# Patient Record
Sex: Female | Born: 1965 | Race: White | Hispanic: No | Smoking: Current every day smoker
Health system: Southern US, Community
[De-identification: ages and names within clinical notes are randomized; demographics above are authoritative.]

## PROBLEM LIST (undated history)

## (undated) DIAGNOSIS — J189 Pneumonia, unspecified organism: Secondary | ICD-10-CM

## (undated) DIAGNOSIS — F319 Bipolar disorder, unspecified: Secondary | ICD-10-CM

## (undated) DIAGNOSIS — G5603 Carpal tunnel syndrome, bilateral upper limbs: Secondary | ICD-10-CM

## (undated) DIAGNOSIS — Z8719 Personal history of other diseases of the digestive system: Secondary | ICD-10-CM

## (undated) DIAGNOSIS — Z9889 Other specified postprocedural states: Secondary | ICD-10-CM

## (undated) DIAGNOSIS — B009 Herpesviral infection, unspecified: Secondary | ICD-10-CM

## (undated) DIAGNOSIS — N200 Calculus of kidney: Secondary | ICD-10-CM

## (undated) DIAGNOSIS — M1990 Unspecified osteoarthritis, unspecified site: Secondary | ICD-10-CM

## (undated) DIAGNOSIS — Z9289 Personal history of other medical treatment: Secondary | ICD-10-CM

## (undated) DIAGNOSIS — C539 Malignant neoplasm of cervix uteri, unspecified: Secondary | ICD-10-CM

## (undated) DIAGNOSIS — Z87442 Personal history of urinary calculi: Secondary | ICD-10-CM

## (undated) DIAGNOSIS — J449 Chronic obstructive pulmonary disease, unspecified: Secondary | ICD-10-CM

## (undated) DIAGNOSIS — F32A Depression, unspecified: Secondary | ICD-10-CM

## (undated) DIAGNOSIS — K219 Gastro-esophageal reflux disease without esophagitis: Secondary | ICD-10-CM

## (undated) DIAGNOSIS — R0600 Dyspnea, unspecified: Secondary | ICD-10-CM

## (undated) HISTORY — DX: Malignant neoplasm of cervix uteri, unspecified: C53.9

## (undated) HISTORY — PX: TUBAL LIGATION: SHX77

## (undated) HISTORY — PX: MULTIPLE TOOTH EXTRACTIONS: SHX2053

## (undated) HISTORY — PX: CERVICAL CONE BIOPSY: SUR198

## (undated) HISTORY — DX: Carpal tunnel syndrome, bilateral upper limbs: G56.03

## (undated) HISTORY — DX: Personal history of other medical treatment: Z92.89

## (undated) HISTORY — PX: TONSILLECTOMY: SUR1361

## (undated) HISTORY — PX: WISDOM TOOTH EXTRACTION: SHX21

---

## 2011-12-14 ENCOUNTER — Encounter (HOSPITAL_COMMUNITY): Payer: Self-pay

## 2011-12-14 ENCOUNTER — Emergency Department (INDEPENDENT_AMBULATORY_CARE_PROVIDER_SITE_OTHER)
Admission: EM | Admit: 2011-12-14 | Discharge: 2011-12-14 | Disposition: A | Discharge status: Home / Self Care | Payer: Self-pay | Source: Home / Self Care | Attending: Family Medicine | Admitting: Family Medicine

## 2011-12-14 DIAGNOSIS — L259 Unspecified contact dermatitis, unspecified cause: Secondary | ICD-10-CM

## 2011-12-14 DIAGNOSIS — N760 Acute vaginitis: Secondary | ICD-10-CM

## 2011-12-14 LAB — POCT URINALYSIS DIP (DEVICE)
Bilirubin Urine: NEGATIVE
Glucose, UA: NEGATIVE mg/dL
Hgb urine dipstick: NEGATIVE
Leukocytes, UA: NEGATIVE
Nitrite: NEGATIVE
Urobilinogen, UA: 0.2 mg/dL (ref 0.0–1.0)
pH: 7 (ref 5.0–8.0)

## 2011-12-14 LAB — WET PREP, GENITAL

## 2011-12-14 MED ORDER — METRONIDAZOLE 500 MG PO TABS: 500.0000 mg | ORAL_TABLET | Freq: Two times a day (BID) | ORAL | Status: AC

## 2011-12-14 MED ORDER — PREDNISONE (PAK) 10 MG PO TABS: ORAL_TABLET | ORAL | Status: DC

## 2011-12-14 NOTE — Discharge Instructions (Signed)
No intercourse x 10 days. We will contact you with any positive lab results and any new instructions if indicated.

## 2011-12-14 NOTE — ED Notes (Signed)
Call back number verified at releade

## 2011-12-14 NOTE — ED Notes (Signed)
States she just got out of jail, and they declined to give her the remining pills in her prednisone dose pack; she was to [facility] another MD for this. States she had trichamonis a couple of yrs ago, and thinks she has it again, since shr has the same symptoms again as she did a few yrs ago; NAD

## 2011-12-14 NOTE — ED Provider Notes (Signed)
History     CSN: [id number]  Arrival date & time 12/14/11  1412   First MD Initiated Contact with Patient 12/14/11 1536      Chief Complaint  Patient presents with  . Medication Refill    (Consider location/radiation/quality/duration/timing/severity/associated sxs/prior treatment) HPI Comments: The patient reports just getting out of jail a wk ago. States she was in the middle of a prednisone taper for poison ivy when released . States they would not give her the remaining tabs. She also states she is having a vaginal discharge. No odor. States she has had trich in past and it feels the same.   The history is provided by the patient.    History reviewed. No pertinent past medical history.  Past Surgical History  Procedure Date  . Tubal ligation     History reviewed. No pertinent family history.  History  Substance Use Topics  . Smoking status: Current Everyday Smoker  . Smokeless tobacco: Not on file  . Alcohol Use: No    OB History    Grav Para Term Preterm Abortions TAB SAB Ect Mult Living                  Review of Systems  Constitutional: Negative.   HENT: Negative.   Respiratory: Negative.   Cardiovascular: Negative.   Gastrointestinal: Negative.   Skin: Negative.     Allergies  Review of patient's allergies indicates no known allergies.  Home Medications   Current Outpatient Rx  Name Route Sig Dispense Refill  . METRONIDAZOLE 500 MG PO TABS Oral Take 1 tablet (500 mg total) by mouth 2 (two) times daily. 14 tablet 0  . PREDNISONE (PAK) 10 MG PO TABS  Disp one 6 day taper Take as directed with food 21 tablet 0    BP 120/70  Pulse 59  Temp(Src) 98.7 F (37.1 C) (Oral)  Resp 24  SpO2 100%  Physical Exam  Nursing note and vitals reviewed. Constitutional: She appears well-developed and well-nourished.  HENT:  Head: Normocephalic and atraumatic.  Cardiovascular: Normal rate, regular rhythm and normal heart sounds.   Pulmonary/Chest: Effort  normal and breath sounds normal.  Abdominal: Soft. Bowel sounds are normal.  Genitourinary:       Pelvic exam with female nursing personal Blase Mess  Assisting reveals no skin or vulvar lesion. Thin white discharge noted. No cmt. Samples collected.   Skin: Skin is warm and dry.    ED Course  Procedures (including critical care time)   Labs Reviewed  POCT URINALYSIS DIP (DEVICE)  POCT PREGNANCY, URINE  GC/CHLAMYDIA PROBE AMP, GENITAL  WET PREP, GENITAL   No results found.   1. Vaginitis   2. Contact dermatitis       MDM          Randa Spike, MD 12/14/11 [phone]

## 2011-12-15 LAB — GC/CHLAMYDIA PROBE AMP, GENITAL: Chlamydia, DNA Probe: NEGATIVE

## 2011-12-28 ENCOUNTER — Telehealth (HOSPITAL_COMMUNITY): Payer: Self-pay | Admitting: *Deleted

## 2011-12-28 NOTE — ED Notes (Signed)
Pt. called on VM for her lab results. States her phone was cut off and is calling from a friends phone.  I called her back. Pt. verified x 2 and given results.  Pt. asked if she had trich?  I said no, only a few clue cells and a few WBC's, that the doctors don't treat.  She said she got Flagyl because of a possible trich. exposure. I told her that was the safest thing to do. Also, if it was bacterial vaginosis then she was adequately treated with the Flagyl. [name] 12/28/2011

## 2012-04-29 ENCOUNTER — Emergency Department (HOSPITAL_COMMUNITY)
Admission: EM | Admit: 2012-04-29 | Discharge: 2012-04-29 | Disposition: A | Discharge status: Home / Self Care | Payer: Self-pay | Attending: Emergency Medicine | Admitting: Emergency Medicine

## 2012-04-29 ENCOUNTER — Encounter (HOSPITAL_COMMUNITY): Payer: Self-pay | Admitting: Emergency Medicine

## 2012-04-29 DIAGNOSIS — K0889 Other specified disorders of teeth and supporting structures: Secondary | ICD-10-CM

## 2012-04-29 DIAGNOSIS — Z79899 Other long term (current) drug therapy: Secondary | ICD-10-CM | POA: Insufficient documentation

## 2012-04-29 DIAGNOSIS — F17200 Nicotine dependence, unspecified, uncomplicated: Secondary | ICD-10-CM | POA: Insufficient documentation

## 2012-04-29 DIAGNOSIS — K047 Periapical abscess without sinus: Secondary | ICD-10-CM | POA: Insufficient documentation

## 2012-04-29 DIAGNOSIS — Z87442 Personal history of urinary calculi: Secondary | ICD-10-CM | POA: Insufficient documentation

## 2012-04-29 HISTORY — DX: Calculus of kidney: N20.0

## 2012-04-29 MED ORDER — AMOXICILLIN 500 MG PO CAPS: 500.0000 mg | ORAL_CAPSULE | Freq: Three times a day (TID) | ORAL | Status: DC

## 2012-04-29 MED ORDER — HYDROCODONE-ACETAMINOPHEN 5-500 MG PO TABS: 1.0000 | ORAL_TABLET | Freq: Four times a day (QID) | ORAL | Status: DC | PRN

## 2012-04-29 NOTE — ED Provider Notes (Signed)
History     CSN: [id number]  Arrival date & time 04/29/12  1751   First MD Initiated Contact with Patient 04/29/12 2019      Chief Complaint  Patient presents with  . Dental Pain    (Consider location/radiation/quality/duration/timing/severity/associated sxs/prior treatment) Patient is a 46 y.o. female presenting with tooth pain. The history is provided by the patient.  Dental PainPrimary symptoms do not include headaches, fever, shortness of breath or sore throat.  pt c/o right lower dental pain and swelling for past few days. Constant, dull. Worse w eating and palpation area. No injury to area. No throat pain or swelling. No neck pain/swelling. No trouble breathing or swallowing. No fever or chills.     Past Medical History  Diagnosis Date  . Kidney stone     Past Surgical History  Procedure Date  . Tubal ligation     No family history on file.  History  Substance Use Topics  . Smoking status: Current Every Day Smoker  . Smokeless tobacco: Not on file  . Alcohol Use: No    OB History    Grav Para Term Preterm Abortions TAB SAB Ect Mult Living                  Review of Systems  Constitutional: Negative for fever and chills.  HENT: Negative for sore throat and neck pain.   Respiratory: Negative for shortness of breath.   Skin: Negative for rash.  Neurological: Negative for headaches.    Allergies  Review of patient's allergies indicates no known allergies.  Home Medications   Current Outpatient Rx  Name Route Sig Dispense Refill  . BUPROPION HCL ER (SR) 100 MG PO TB12 Oral Take 100 mg by mouth 2 (two) times daily.    Marland Kitchen GABAPENTIN 300 MG PO CAPS Oral Take 300 mg by mouth 2 (two) times daily.      BP 116/78  Pulse 64  Temp 98.6 F (37 C) (Oral)  Resp 20  SpO2 98%  LMP 04/29/2012  Physical Exam  Nursing note and vitals reviewed. Constitutional: She appears well-developed and well-nourished. No distress.  HENT:  Mouth/Throat: Oropharynx is  clear and moist.       Pharynx normal. Right lower molar dental tenderness and associated gum swelling. No trismus. No pain,swelling or tenderness to neck or floor of mouth.    Eyes: Conjunctivae normal are normal. No scleral icterus.  Neck: Neck supple. No tracheal deviation present.  Cardiovascular: Normal rate.   Pulmonary/Chest: Effort normal. No respiratory distress.  Abdominal: Normal appearance.  Musculoskeletal: She exhibits no edema.  Lymphadenopathy:    She has no cervical adenopathy.  Neurological: She is alert.  Skin: Skin is warm and dry. No rash noted.  Psychiatric: She has a normal mood and affect.    ED Course  Procedures (including critical care time)     MDM  Exam c/w dental abscess. Confirmed nkda w pt.  Discussed need close dental follow up.         Suzi Roots, MD 04/29/12 2026

## 2012-04-29 NOTE — ED Notes (Signed)
Pt st's she has pain in right upper back tooth for several days.  Woke up this am with swelling to right side of face and jaw.

## 2013-04-09 ENCOUNTER — Encounter (HOSPITAL_COMMUNITY): Payer: Self-pay | Admitting: Emergency Medicine

## 2013-04-09 ENCOUNTER — Emergency Department (HOSPITAL_COMMUNITY)
Admission: EM | Admit: 2013-04-09 | Discharge: 2013-04-09 | Disposition: A | Discharge status: Home / Self Care | Payer: Self-pay | Attending: Emergency Medicine | Admitting: Emergency Medicine

## 2013-04-09 DIAGNOSIS — Z792 Long term (current) use of antibiotics: Secondary | ICD-10-CM | POA: Insufficient documentation

## 2013-04-09 DIAGNOSIS — F17200 Nicotine dependence, unspecified, uncomplicated: Secondary | ICD-10-CM | POA: Insufficient documentation

## 2013-04-09 DIAGNOSIS — Z79899 Other long term (current) drug therapy: Secondary | ICD-10-CM | POA: Insufficient documentation

## 2013-04-09 DIAGNOSIS — K0889 Other specified disorders of teeth and supporting structures: Secondary | ICD-10-CM

## 2013-04-09 DIAGNOSIS — Z87442 Personal history of urinary calculi: Secondary | ICD-10-CM | POA: Insufficient documentation

## 2013-04-09 DIAGNOSIS — K089 Disorder of teeth and supporting structures, unspecified: Secondary | ICD-10-CM | POA: Insufficient documentation

## 2013-04-09 MED ORDER — HYDROCODONE-ACETAMINOPHEN 5-325 MG PO TABS: 1.0000 | ORAL_TABLET | Freq: Four times a day (QID) | ORAL | Status: DC | PRN

## 2013-04-09 MED ORDER — PENICILLIN V POTASSIUM 500 MG PO TABS: 500.0000 mg | ORAL_TABLET | Freq: Four times a day (QID) | ORAL | Status: AC

## 2013-04-09 MED ORDER — IBUPROFEN 800 MG PO TABS: 800.0000 mg | ORAL_TABLET | Freq: Three times a day (TID) | ORAL | Status: DC

## 2013-04-09 NOTE — ED Provider Notes (Signed)
CSN: [id number]     Arrival date & time 04/09/13  1322 History  This chart was scribed for non-physician practitioner Jaynie Crumble, PA-C working with Suzi Roots, MD by Valera Castle, ED scribe. This patient was seen in room TR04C/TR04C and the patient's care was started at 2:52 PM.    Chief Complaint  Patient presents with  . Dental Pain    Patient is a 47 y.o. female presenting with tooth pain. The history is provided by the patient. No language interpreter was used.  Dental Pain Location:  Lower Severity:  Moderate Onset quality:  Gradual Duration:  2 days Timing:  Constant Progression:  Worsening Chronicity:  New Abscess: Pt reports possible abscess.   Relieved by:  Nothing Associated symptoms: no fever    HPI Comments: Flora Parks is a 47 y.o. female who presents to the Emergency Department complaining of sudden, right lower dental pain, onset 2 days ago. She thinks there may be an abscess. She reports that a filling fell out of a lower tooth about 2 years ago. She states that she was unable to sleep last night due to the pain. She reports taking tylenol, with little relief. She denies fever, or any other associated symptoms. She has no known allergies, and other than a kidney stone, she denies any medical history.  Past Medical History  Diagnosis Date  . Kidney stone    Past Surgical History  Procedure Laterality Date  . Tubal ligation     No family history on file. History  Substance Use Topics  . Smoking status: Current Every Day Smoker  . Smokeless tobacco: Not on file  . Alcohol Use: No   OB History   Grav Para Term Preterm Abortions TAB SAB Ect Mult Living                 Review of Systems  Constitutional: Negative for fever.  HENT: Positive for dental problem (Right lower tooth pain.).   All other systems reviewed and are negative.    Allergies  Review of patient's allergies indicates no known allergies.  Home Medications   Current  Outpatient Rx  Name  Route  Sig  Dispense  Refill  . amoxicillin (AMOXIL) 500 MG capsule   Oral   Take 1 capsule (500 mg total) by mouth 3 (three) times daily.   21 capsule   0   . buPROPion (WELLBUTRIN SR) 100 MG 12 hr tablet   Oral   Take 100 mg by mouth 2 (two) times daily.         Marland Kitchen gabapentin (NEURONTIN) 300 MG capsule   Oral   Take 300 mg by mouth 2 (two) times daily.         Marland Kitchen HYDROcodone-acetaminophen (VICODIN) 5-500 MG per tablet   Oral   Take 1-2 tablets by mouth every 6 (six) hours as needed for pain.   20 tablet   0    Triage Vitals: BP 118/79  Pulse 68  Temp(Src) 98.1 F (36.7 C) (Oral)  Resp 22  Ht 5\' 7"  (1.702 m)  Wt 160 lb (72.576 kg)  BMI 25.05 kg/m2  SpO2 99%  Physical Exam  Nursing note and vitals reviewed. Constitutional: She is oriented to person, place, and time. She appears well-developed and well-nourished. No distress.  HENT:  Head: Normocephalic and atraumatic.  Poor dentition. Large cavity in right lower second molar with surrounding gum erythema. No obvious abscess. No gum swelling. No facial swelling. Tooth tender to palpation.  Eyes: EOM are normal.  Neck: Neck supple. No tracheal deviation present.  Cardiovascular: Normal rate.   Pulmonary/Chest: Effort normal. No respiratory distress.  Musculoskeletal: Normal range of motion.  Neurological: She is alert and oriented to person, place, and time.  Skin: Skin is warm and dry.  Psychiatric: She has a normal mood and affect. Her behavior is normal.    ED Course  Procedures (including critical care time)  DIAGNOSTIC STUDIES: Oxygen Saturation is 99% on room air, normal by my interpretation.    COORDINATION OF CARE: 2:56 PM-Discussed treatment plan with pt at bedside and pt agreed to plan.      Labs Review Labs Reviewed - No data to display Imaging Review No results found.  MDM   1. Pain, dental    Patient with right lower tooth pain. Her pain is a large cavity in it.  No obvious abscess at this time. Patient states last year and this time patient had a large abscess but did never followup with a dentist. I have discussed the importance of following up. Patient will be started on penicillin, ibuprofen Norco for pain. Patient given resources for followup. Patient is nontoxic afebrile. Stable for discharge.  Filed Vitals:   04/09/13 1327  BP: 118/79  Pulse: 68  Temp: 98.1 F (36.7 C)  TempSrc: Oral  Resp: 22  Height: 5\' 7"  (1.702 m)  Weight: 160 lb (72.576 kg)  SpO2: 99%    I personally performed the services described in this documentation, which was scribed in my presence. The recorded information has been reviewed and is accurate.   Lottie Mussel, PA-C 04/09/13 1520

## 2013-04-09 NOTE — ED Notes (Signed)
Lower rt  Tooth pain x 2 days

## 2013-04-14 NOTE — ED Provider Notes (Signed)
Medical screening examination/treatment/procedure(s) were performed by non-physician practitioner and as supervising physician I was immediately available for consultation/collaboration.   Mykai Wendorf E Latonyia Lopata, MD 04/14/13 0736 

## 2013-08-18 ENCOUNTER — Encounter (HOSPITAL_COMMUNITY): Payer: Self-pay | Admitting: Emergency Medicine

## 2013-08-18 ENCOUNTER — Emergency Department (HOSPITAL_COMMUNITY)
Admission: EM | Admit: 2013-08-18 | Discharge: 2013-08-18 | Disposition: A | Discharge status: Home / Self Care | Payer: Self-pay | Attending: Emergency Medicine | Admitting: Emergency Medicine

## 2013-08-18 DIAGNOSIS — K0889 Other specified disorders of teeth and supporting structures: Secondary | ICD-10-CM

## 2013-08-18 DIAGNOSIS — Z87442 Personal history of urinary calculi: Secondary | ICD-10-CM | POA: Insufficient documentation

## 2013-08-18 DIAGNOSIS — K089 Disorder of teeth and supporting structures, unspecified: Secondary | ICD-10-CM | POA: Insufficient documentation

## 2013-08-18 DIAGNOSIS — F17200 Nicotine dependence, unspecified, uncomplicated: Secondary | ICD-10-CM | POA: Insufficient documentation

## 2013-08-18 MED ORDER — OXYCODONE-ACETAMINOPHEN 5-325 MG PO TABS
2.0000 | ORAL_TABLET | Freq: Once | ORAL | Status: AC
  Administered 2013-08-18: 2 via ORAL
  Filled 2013-08-18: qty 2

## 2013-08-18 MED ORDER — PENICILLIN V POTASSIUM 250 MG PO TABS
500.0000 mg | ORAL_TABLET | Freq: Once | ORAL | Status: AC
  Administered 2013-08-18: 500 mg via ORAL
  Filled 2013-08-18: qty 2

## 2013-08-18 MED ORDER — ONDANSETRON 4 MG PO TBDP
8.0000 mg | ORAL_TABLET | Freq: Once | ORAL | Status: AC
  Administered 2013-08-18: 8 mg via ORAL
  Filled 2013-08-18: qty 2

## 2013-08-18 MED ORDER — OXYCODONE-ACETAMINOPHEN 5-325 MG PO TABS: 2.0000 | ORAL_TABLET | ORAL | Status: DC | PRN

## 2013-08-18 MED ORDER — PENICILLIN V POTASSIUM 500 MG PO TABS: 500.0000 mg | ORAL_TABLET | Freq: Four times a day (QID) | ORAL | Status: DC

## 2013-08-18 NOTE — ED Provider Notes (Signed)
CSN: [id number]     Arrival date & time 08/18/13  1528 History  This chart was scribed for Alvina Chou, PA working with Blanchie Dessert, MD by Roxan Diesel, ED Scribe. This patient was seen in room TR08C/TR08C and [facility] patient's care was started at 6:09 PM.   Chief Complaint  Patient presents with  . Facial Swelling    [facility] history is provided by Katie patient. No language interpreter was used.    HPI Comments: [facility] is a 48 y.o. female with h/o dental abscesses who presents to [facility] complaining of progressively-worsening upper right-sided dental pain with associated facial swelling that she first noticed after waking this morning.  Pt states she initially noticed a small amount of swelling with only minimal pain.  Pain and swelling both became significantly more severe over [facility] course of several hours.  She describes her pain as throbbing, at a severity of 9/10.  It is worsened by moving her mouth.  She has not attempted to treat pain pta.  She denies fever or SOB.  Pt has h/o dental abscess on lower right jaw.   Past Medical History  Diagnosis Date  . Kidney stone     Past Surgical History  Procedure Laterality Date  . Tubal ligation      No family history on file.   History  Substance Use Topics  . Smoking status: Current Every Day Smoker    Types: Cigarettes  . Smokeless tobacco: Not on file  . Alcohol Use: No    OB History   Grav Para Term Preterm Abortions TAB SAB Ect Mult Living                  Review of Systems  Constitutional: Negative for fever.  HENT: Positive for dental problem and facial swelling.   Respiratory: Negative for shortness of breath.   All other systems reviewed and are negative.     Allergies  Review of patient's allergies indicates no known allergies.  Home Medications   No current outpatient prescriptions on file. BP 133/81  Pulse 60  Temp(Src) 97.9 F (36.6 C) (Oral)  Resp 20  Ht 5\' 7"  (1.702  m)  Wt 165 lb (74.844 kg)  BMI 25.84 kg/m2  SpO2 100%  Physical Exam  Nursing note and vitals reviewed. Constitutional: She is oriented to person, place, and time. She appears well-developed and well-nourished. No distress.  HENT:  Head: Normocephalic and atraumatic.  Mild induration and edema of right maxillary skin that is tender to palpation Poor dentition.  Right upper incisor tender to percussion.  No abscess identified.  Eyes: EOM are normal.  Neck: Neck supple. No tracheal deviation present.  Cardiovascular: Normal rate.   Pulmonary/Chest: Effort normal. No respiratory distress.  Musculoskeletal: Normal range of motion.  Lymphadenopathy:    She has no cervical adenopathy.  Neurological: She is alert and oriented to person, place, and time.  Skin: Skin is warm and dry.  Psychiatric: She has a normal mood and affect. Her behavior is normal.    ED Course  Procedures (including critical care time)  DIAGNOSTIC STUDIES: Oxygen Saturation is 100% on room air, normal by my interpretation.    COORDINATION OF CARE: 6:12 PM-Discussed treatment plan which includes pain medication, antibiotics, and dental referral with pt at bedside and pt agreed to plan.    Labs Review Labs Reviewed - No data to display  Imaging Review No results found.  EKG Interpretation   None  MDM   Final diagnoses:  None  1. Dental pain  6:17 PM Patient will have Percocet and penicillin for symptoms. Patient advised to follow up with dentist from [facility] resource guide for further evaluation and management. Patient instructed to return with worsening or concerning symptoms. Vitals stable and patient febrile.     I personally performed [facility] services described in this documentation, which was scribed in my presence. [facility] recorded information has been reviewed and is accurate.    Alvina Chou, PA-C 08/18/13 1819

## 2013-08-18 NOTE — ED Notes (Signed)
Pt states she is not sure if she has an infected tooth or not but woke up this morning with a little pain and swelling to her cheek just beside the bridge of her nose on the right side.

## 2013-08-18 NOTE — Discharge Instructions (Signed)
Take Veetid as directed until gone. Take Percocet as needed for pain. Follow up with dentist from the resource guide. Refer to attached documents for more information.    Emergency Department Resource Guide 1) Find a Doctor and Pay Out of Pocket Although you won't have to find out who is covered by your insurance plan, it is a good idea to ask around and get recommendations. You will then need to call the office and see if the doctor you have chosen will accept you as a new patient and what types of options they offer for patients who are self-pay. Some doctors offer discounts or will set up payment plans for their patients who do not have insurance, but you will need to ask so you aren't surprised when you get to your appointment.  2) Contact Your Local Health Department Not all health departments have doctors that can see patients for sick visits, but many do, so it is worth a call to see if yours does. If you don't know where your local health department is, you can check in your phone book. The CDC also has a tool to help you locate your state's health department, and many state websites also have listings of all of their local health departments.  3) Find a [facility] If your illness is not likely to be very severe or complicated, you may want to try a walk in clinic. These are popping up all over the country in pharmacies, drugstores, and shopping centers. They're usually staffed by nurse practitioners or physician assistants that have been trained to treat common illnesses and complaints. They're usually fairly quick and inexpensive. However, if you have serious medical issues or chronic medical problems, these are probably not your best option.  No Primary Care Doctor: - Call Health Connect at  [phone] - they can help you locate a primary care doctor that  accepts your insurance, provides certain services, etc. - Physician Referral Service- [phone]  Chronic Pain  Problems: Organization         Address  Phone   Notes  [facility]  [phone] Patients need to be referred by their primary care doctor.   Medication Assistance: Organization         Address  Phone   Notes  [facility] [address] [place], [place] [address] [phone] --Must be a resident of [facility] -- Must have NO insurance coverage whatsoever (no Medicaid/ Medicare, etc.) -- The pt. MUST have a primary care doctor that directs their care regularly and follows them in the community   MedAssist  [phone]   [employer]  [phone]    Agencies that provide inexpensive medical care: Organization         Address  Phone   Notes  [place]  [phone]   [facility]    [phone]   [facility] [place], [place] [address] [phone]   [place] [address] [place] ([phone]   Planned Parenthood    [phone]   [facility]    [phone]   [place] and [place] [address], [place] Phone:  [phone], Fax:  [phone] Hours of Operation:  9 am - 6 pm, M-F.  Also accepts Medicaid/Medicare and self-pay.  [facility] for [place] [address],  [address], [place] Phone: [phone], Fax: [phone]. Hours of Operation:  8:30 am - 5:30 pm, M-F.  Also accepts Medicaid and self-pay.  [facility] [place] [address] [place] Phone: [phone]   [place], [place], [place] [phone], Ext. [phone] Mondays & Thursdays: 7-9 AM.  First 15 patients are seen on a first come, first serve basis.    [place] Providers:  Organization         Address  Phone   Notes  [facility] [address], [place] [phone] Also  accepts self-pay patients.  [facility] [address], [place]  [phone]   [place] [address], [place] [phone]   [facility] [address] [place] [phone]   Lucianne Lei [address], [place]   [phone] Only accepts [place] Access [place] patients after they have their name applied to their card.   Self-Pay (no insurance) in [facility]:  Organization         Address  Phone   Notes  Sickle Cell Patients, [facility] Internal Medicine [place] [phone]   [facility] [place] [phone]   [facility] [place]  [place], [place], [place] [phone]   [facility]/Dr. Osei-Bonsu  [address] [place] [address], [place] or [address], [place] [phone] Phone number for both [place] and [place] locations is the same.  [facility] [address] [place] [phone]   [facility] [address], [place] or [address] [phone]   [facility] [address], [place] [phone], phone; [phone], fax Sees patients 1st and 3rd Saturday of every month.  Must not qualify for public or private insurance (i.e. Medicaid, Medicare, [place] Health Choice, Veterans' Benefits)  Household income should be no more than 200% of the poverty level The clinic cannot treat you if you are pregnant or think you are pregnant  Sexually transmitted diseases are not treated at the clinic.    Dental Care: Organization         Address  Phone  Notes  [facility] Department of [facility] [phone] Accepts children up to age 14 who are enrolled in [place] or [place]; pregnant  women with a Medicaid card; and children who have applied for Medicaid or [place] Health Choice, but were declined, whose parents can pay a reduced fee at time of service.  [facility] Department of [facility]  [address], [place] [phone] Accepts children up to age 57 who are enrolled in [place] or [place]; pregnant women with a Medicaid card; and children who have applied for Medicaid or [place] Health Choice, but were declined, whose parents can pay a reduced fee at time of service.  [facility] Access PROGRAM  [place] [phone] Patients are seen by appointment only. Walk-ins are not accepted. [place] will see patients 65 years of age and older. Monday - Tuesday (8am-5pm) Most Wednesdays (8:30-5pm) $30 per visit, cash only  [place]  [address], [place] [phone] Patients are seen by appointment only. Walk-ins are not accepted. [place] will see patients 47 years of age and older. One Wednesday Evening (Monthly: Volunteer Based).  $30 per visit, cash only  [place]  [phone] for adults; Children under age 20, call Graduate Pediatric Dentistry at [phone]. Children aged 75-14, please call [phone] to request a pediatric application.  Dental services are provided in all areas of dental care including fillings, crowns and bridges, complete and partial dentures, implants, gum treatment, root canals, and extractions. Preventive care is also provided. Treatment is provided to both adults and children. Patients are selected via a lottery and there is often a waiting list.   [facility] [address], [place]  [phone] [email or web address]   [facility] [address] [place], [place] [phone], Ext. [phone] Second and Fourth Thursday of each month, opens at 6:30 AM; Clinic ends at 9 AM.  Patients are  seen on a first-come first-served basis, and a limited number are seen during each clinic.   [facility]  [address] Hillard Danker [place], [place] [phone]   Eligibility Requirements You must have lived in [place], [place], or [place] counties for at least the last three months.   You cannot be eligible for state or federal sponsored [employer], including [employer], [place], or [employer].   You generally cannot be eligible for healthcare insurance through your employer.    How to apply: Eligibility screenings are held every Tuesday and Wednesday afternoon from 1:00 pm until 4:00 pm. You do not need an appointment for the interview!  [facility] [address] [place], [place]   [place]  [place] Department  [place]  [phone]    Behavioral Health Resources in the Community: Intensive Outpatient Programs Organization         Address  Phone  Notes  [place]. [address] [place], [place] [phone]   [facility] [address] [place], [place]   ADS: [facility] [address] [place], [place]   [place] [address]  [place], [place] or [phone]   Substance Abuse Resources Organization         Address  Phone  Notes  Alcohol and Drug Services  [phone]   [place]  [phone]   [facility] [place]   Chinita Pester  [phone]   Residential & Outpatient Substance Abuse Program  [phone]   Psychological Services Organization         Address  Phone  Notes  [facility] [place]  [place]  [phone]   [place] [address] [place] or [phone]    Mobile Crisis  Teams Organization         Address  Phone  Notes  [facility], Mobile Crisis Care Unit  [phone]   [facility]  [address] [place], [place]   [place] [address] [place] [phone]    Self-Help/Support Groups Organization         Address  Phone             Notes  [place]. of [place] -  variety of support groups  [phone]- [phone] Call for more information  Narcotics Anonymous (NA), [facility] [address], [employer] [place]  2 meetings at this location   Residential Facilities manager         Address  Phone  Notes  [facility] [place],    [place]  [phone]   [facility]  [address] [place] 623762, [place], [place]   [place], [place] [phone] Admissions: 8am-3pm M-F  Incentives Substance [place] [address],    [place], [place] [phone]   [facility] [address] [place], [place], [place]   [facility] [address]  [place], [place]   [facility]ight Programs - Intensive Outpatient [address] Kristeen [address], [place], [place]   [facility] [place] ([place].,  [place], [place] [phone] or [phone]   [facility]idential Treatment Services (RTS) [address] [place], [place] Accepts Medicaid  [facility] [place] [address],  [place] [phone] Substance Abuse/Addiction Treatment   [facility] Organization         Address  Phone  Notes  [facility]  [phone]   Domenic Schwab, PhD [address] [place]lis [place], [place]   [phone] or [phone]   [place], [place] [phone]   [facility] [address] [place], [place] [phone]  Insurance/Medicaid/sponsorship through [facility] [employer] and Families [address]                                    [place], [place] [phone] [place] [address], [place] [phone]    Dr. Adele Schilder  [phone]   [facility] Dept. 1) [address] [place] 2) [place] 3)  [place] [address], [place] [phone]  [phone]   [place] [phone] or [phone] (After Hours)

## 2013-08-19 ENCOUNTER — Emergency Department (HOSPITAL_COMMUNITY): Payer: Self-pay

## 2013-08-19 ENCOUNTER — Encounter (HOSPITAL_COMMUNITY): Payer: Self-pay | Admitting: Emergency Medicine

## 2013-08-19 ENCOUNTER — Emergency Department (HOSPITAL_COMMUNITY)
Admission: EM | Admit: 2013-08-19 | Discharge: 2013-08-19 | Disposition: A | Discharge status: Home / Self Care | Payer: Self-pay | Attending: Emergency Medicine | Admitting: Emergency Medicine

## 2013-08-19 DIAGNOSIS — F17200 Nicotine dependence, unspecified, uncomplicated: Secondary | ICD-10-CM | POA: Insufficient documentation

## 2013-08-19 DIAGNOSIS — R221 Localized swelling, mass and lump, neck: Secondary | ICD-10-CM

## 2013-08-19 DIAGNOSIS — K047 Periapical abscess without sinus: Secondary | ICD-10-CM | POA: Insufficient documentation

## 2013-08-19 DIAGNOSIS — Z87442 Personal history of urinary calculi: Secondary | ICD-10-CM | POA: Insufficient documentation

## 2013-08-19 DIAGNOSIS — Z79899 Other long term (current) drug therapy: Secondary | ICD-10-CM | POA: Insufficient documentation

## 2013-08-19 DIAGNOSIS — R220 Localized swelling, mass and lump, head: Secondary | ICD-10-CM | POA: Insufficient documentation

## 2013-08-19 DIAGNOSIS — Z792 Long term (current) use of antibiotics: Secondary | ICD-10-CM | POA: Insufficient documentation

## 2013-08-19 LAB — CBC WITH DIFFERENTIAL/PLATELET
BASOS ABS: 0.1 10*3/uL (ref 0.0–0.1)
Basophils Relative: 1 % (ref 0–1)
EOS ABS: 0.3 10*3/uL (ref 0.0–0.7)
EOS PCT: 3 % (ref 0–5)
HCT: 37.4 % (ref 36.0–46.0)
Hemoglobin: 13.1 g/dL (ref 12.0–15.0)
Lymphocytes Relative: 39 % (ref 12–46)
Lymphs Abs: 4 10*3/uL (ref 0.7–4.0)
MCH: 32.8 pg (ref 26.0–34.0)
MCHC: 35 g/dL (ref 30.0–36.0)
MCV: 93.7 fL (ref 78.0–100.0)
Monocytes Absolute: 1.1 10*3/uL — ABNORMAL HIGH (ref 0.1–1.0)
Monocytes Relative: 10 % (ref 3–12)
Neutro Abs: 4.9 10*3/uL (ref 1.7–7.7)
Neutrophils Relative %: 48 % (ref 43–77)
PLATELETS: 275 10*3/uL (ref 150–400)
RBC: 3.99 MIL/uL (ref 3.87–5.11)
RDW: 12.8 % (ref 11.5–15.5)
WBC: 10.3 10*3/uL (ref 4.0–10.5)

## 2013-08-19 LAB — BASIC METABOLIC PANEL
BUN: 10 mg/dL (ref 6–23)
CALCIUM: 8.8 mg/dL (ref 8.4–10.5)
CO2: 27 mEq/L (ref 19–32)
Chloride: 107 mEq/L (ref 96–112)
Creatinine, Ser: 0.99 mg/dL (ref 0.50–1.10)
GFR calc Af Amer: 78 mL/min — ABNORMAL LOW (ref >90)
GFR, EST NON AFRICAN AMERICAN: 67 mL/min — AB (ref >90)
Glucose, Bld: 119 mg/dL — ABNORMAL HIGH (ref 70–99)
Potassium: 4.1 mEq/L (ref 3.7–5.3)
SODIUM: 145 meq/L (ref 137–147)

## 2013-08-19 MED ORDER — ONDANSETRON HCL 4 MG/2ML IJ SOLN
4.0000 mg | Freq: Once | INTRAMUSCULAR | Status: AC
  Administered 2013-08-19: 4 mg via INTRAVENOUS
  Filled 2013-08-19: qty 2

## 2013-08-19 MED ORDER — HYDROMORPHONE HCL PF 1 MG/ML IJ SOLN
1.0000 mg | Freq: Once | INTRAMUSCULAR | Status: AC
  Administered 2013-08-19: 1 mg via INTRAVENOUS
  Filled 2013-08-19: qty 1

## 2013-08-19 MED ORDER — IOHEXOL 300 MG/ML  SOLN
100.0000 mL | Freq: Once | INTRAMUSCULAR | Status: AC | PRN
  Administered 2013-08-19: 100 mL via INTRAVENOUS

## 2013-08-19 MED ORDER — CLINDAMYCIN PHOSPHATE 900 MG/50ML IV SOLN
900.0000 mg | Freq: Once | INTRAVENOUS | Status: AC
  Administered 2013-08-19: 900 mg via INTRAVENOUS
  Filled 2013-08-19: qty 50

## 2013-08-19 MED ORDER — SODIUM CHLORIDE 0.9 % IV BOLUS (SEPSIS)
1000.0000 mL | Freq: Once | INTRAVENOUS | Status: AC
  Administered 2013-08-19: 1000 mL via INTRAVENOUS

## 2013-08-19 NOTE — ED Provider Notes (Signed)
Medical screening examination/treatment/procedure(s) were performed by non-physician practitioner and as supervising physician I was immediately available for consultation/collaboration.  EKG Interpretation   None         Blanchie Dessert, MD 08/19/13 [phone]

## 2013-08-19 NOTE — ED Provider Notes (Signed)
Patient care assumed from [facility], PA-C at shift change with CT maxillofacial pending. Plan discussed with [facility], PA-C; will d/c if CT negative with instruction to continue Penicillin course.  CT significant for right facial swelling without gas for evidence of abscess. Swelling thought to be dental in origin given multiple right periapical abscesses. Patient afebrile and hemodynamically stable. She is appropriate for discharge today with instruction to followup with a dentist. Referrals and resource guide provided. Patient agreeable to plan with no unaddressed concerns.   Results for orders placed during the hospital encounter of 08/19/13  CBC WITH DIFFERENTIAL      Result Value Range   WBC 10.3  4.0 - 10.5 K/uL   RBC 3.99  3.87 - 5.11 MIL/uL   Hemoglobin 13.1  12.0 - 15.0 g/dL   HCT 37.4  36.0 - 46.0 %   MCV 93.7  78.0 - 100.0 fL   MCH 32.8  26.0 - 34.0 pg   MCHC 35.0  30.0 - 36.0 g/dL   RDW 12.8  11.5 - 15.5 %   Platelets 275  150 - 400 K/uL   Neutrophils Relative % 48  43 - 77 %   Neutro Abs 4.9  1.7 - 7.7 K/uL   Lymphocytes Relative 39  12 - 46 %   Lymphs Abs 4.0  0.7 - 4.0 K/uL   Monocytes Relative 10  3 - 12 %   Monocytes Absolute 1.1 (*) 0.1 - 1.0 K/uL   Eosinophils Relative 3  0 - 5 %   Eosinophils Absolute 0.3  0.0 - 0.7 K/uL   Basophils Relative 1  0 - 1 %   Basophils Absolute 0.1  0.0 - 0.1 K/uL  BASIC METABOLIC PANEL      Result Value Range   Sodium 145  137 - 147 mEq/L   Potassium 4.1  3.7 - 5.3 mEq/L   Chloride 107  96 - 112 mEq/L   CO2 27  19 - 32 mEq/L   Glucose, Bld 119 (*) 70 - 99 mg/dL   BUN 10  6 - 23 mg/dL   Creatinine, Ser 0.99  0.50 - 1.10 mg/dL   Calcium 8.8  8.4 - 10.5 mg/dL   GFR calc non Af Amer 67 (*) >90 mL/min   GFR calc Af Amer 78 (*) >90 mL/min   Ct Maxillofacial W/cm  08/19/2013   CLINICAL DATA:  Right-sided pain and swelling.  EXAM: CT MAXILLOFACIAL WITH CONTRAST  TECHNIQUE: Multidetector CT imaging of the maxillofacial  structures was performed with intravenous contrast. Multiplanar CT image reconstructions were also generated. A small metallic BB was placed on the right temple in order to reliably differentiate right from left.  CONTRAST:  121mL OMNIPAQUE IOHEXOL 300 MG/ML  SOLN  COMPARISON:  None available for comparison at time of study interpretation.  FINDINGS: Markedly right facial soft tissue swelling without subcutaneous gas or radiopaque foreign bodies. Right posterior mandible caries and periapical abscess and, in addition to tooth approximate 6 periapical abscess. No drainable fluid collections within the face. Right facial hyperemia with enlarged veins. No postseptal inflammatory changes, the ocular globes and orbital contents are nonsuspicious. Symmetric mildly prominent superior ophthalmic may veins may reflect Valsalva.  Mild right maxillary mucosal thickening without paranasal sinus air-fluid levels. Trace right frontal mucosal thickening extent of the right anterior ethmoid. Left concha bullosa.  Included view of the neck is unremarkable.  IMPRESSION: Right facial swelling without subcutaneous gas or abscess, this may be odontogenic in origin as there  are multiple right periapical abscess ease.   Electronically Signed   By: Elon Alas   On: 08/19/2013 04:17      Antonietta Breach, PA-C 08/19/13 [phone]

## 2013-08-19 NOTE — ED Notes (Signed)
Patient was seen here today and d/c home for dental abscess.  Patient went home and called EMS to bring her here b/c she feels like her dental problems is not better.  Patient was triaged and told to wait in waiting room.  Patient came to [facility] very upset, irate, and cursing.  Explained to patient the process of waiting and we will try to get her back as soon as we can.  Patient states, " I am back because I should have never been fucking d/c home."  Told patient to calm down and watch her language because there are kids in the ED.  Patient states, " I am not lowering by voice and I want to speak to the supervisor."  Radio broadcast assistant

## 2013-08-19 NOTE — ED Provider Notes (Signed)
Medical screening examination/treatment/procedure(s) were performed by non-physician practitioner and as supervising physician I was immediately available for consultation/collaboration.   Teressa Lower, MD 08/19/13 2259

## 2013-08-19 NOTE — Discharge Instructions (Signed)
Dental Abscess A dental abscess is a collection of infected fluid (pus) from a bacterial infection in the inner part of the tooth (pulp). It usually occurs at the end of the tooth's root.  CAUSES   Severe tooth decay.  Trauma to the tooth that allows bacteria to enter into the pulp, such as a broken or chipped tooth. SYMPTOMS   Severe pain in and around the infected tooth.  Swelling and redness around the abscessed tooth or in the mouth or face.  Tenderness.  Pus drainage.  Bad breath.  Bitter taste in the mouth.  Difficulty swallowing.  Difficulty opening the mouth.  Nausea.  Vomiting.  Chills.  Swollen neck glands. DIAGNOSIS   A medical and dental history will be taken.  An examination will be performed by tapping on the abscessed tooth.  X-rays may be taken of the tooth to identify the abscess. TREATMENT The goal of treatment is to eliminate the infection. You may be prescribed antibiotic medicine to stop the infection from spreading. A root canal may be performed to save the tooth. If the tooth cannot be saved, it may be pulled (extracted) and the abscess may be drained.  HOME CARE INSTRUCTIONS  Only take over-the-counter or prescription medicines for pain, fever, or discomfort as directed by your caregiver.  Rinse your mouth (gargle) often with salt water ( tsp salt in 8 oz [250 ml] of warm water) to relieve pain or swelling.  Do not drive after taking pain medicine (narcotics).  Do not apply heat to the outside of your face.  Return to your dentist for further treatment as directed. SEEK MEDICAL CARE IF:  Your pain is not helped by medicine.  Your pain is getting worse instead of better. SEEK IMMEDIATE MEDICAL CARE IF:  You have a fever or persistent symptoms for more than 2 3 days.  You have a fever and your symptoms suddenly get worse.  You have chills or a very bad headache.  You have problems breathing or swallowing.  You have trouble  opening your mouth.  You have swelling in the neck or around the eye. Document Released: 06/26/2005 Document Revised: 03/20/2012 Document Reviewed: 10/04/2010 [facility] Patient Information 2014 [place], [place].  Emergency Department Resource Guide 1) Find a Doctor and Pay Out of Pocket Although you won't have to find out who is covered by your insurance plan, it is a good idea to ask around and get recommendations. You will then need to call the office and see if the doctor you have chosen will accept you as a new patient and what types of options they offer for patients who are self-pay. Some doctors offer discounts or will set up payment plans for their patients who do not have insurance, but you will need to ask so you aren't surprised when you get to your appointment.  2) Contact Your Local Health Department Not all health departments have doctors that can see patients for sick visits, but many do, so it is worth a call to see if yours does. If you don't know where your local health department is, you can check in your phone book. The CDC also has a tool to help you locate your state's health department, and many state websites also have listings of all of their local health departments.  3) Find a [facility] If your illness is not likely to be very severe or complicated, you may want to try a walk in clinic. These are popping up all over the  country in pharmacies, drugstores, and shopping centers. They're usually staffed by nurse practitioners or physician assistants that have been trained to treat common illnesses and complaints. They're usually fairly quick and inexpensive. However, if you have serious medical issues or chronic medical problems, these are probably not your best option.  No Primary Care Doctor: - Call Health Connect at  [phone] - they can help you locate a primary care doctor that  accepts your insurance, provides certain services, etc. - Physician Referral Service-  [phone]  Chronic Pain Problems: Organization         Address  Phone   Notes  [facility]  [phone] Patients need to be referred by their primary care doctor.   Medication Assistance: Organization         Address  Phone   Notes  [facility] Medication [facility] [place] [place], [place] [address] [phone] --Must be a resident of [facility] -- Must have NO insurance coverage whatsoever (no Medicaid/ Medicare, etc.) -- The pt. MUST have a primary care doctor that directs their care regularly and follows them in the community   [facility]  [phone]   [employer]  [phone]    Agencies that provide inexpensive medical care: Organization         Address  Phone   Notes  [facility]  [phone]   [facility]    [phone]   [facility] [place], [place] [address] [phone]   [facility] [address] [place] [phone]   Planned Parenthood    [phone]   [facility]    [phone]   [place] [address], [place] Phone:  [phone], Fax:  [phone] Hours of Operation:  9 am - 6 pm, M-F.  Also accepts Medicaid/Medicare and self-pay.  [facility] [place] for [place], [address], [place] Phone: [phone], Fax: [phone]. Hours of Operation:  8:30 am - 5:30 pm, M-F.  Also accepts Medicaid and self-pay.  [facility] [place] [address], [place] Phone: [phone]   [place], [place], [place] [phone], Ext. [phone] Mondays & Thursdays: 7-9 AM.  First 15 patients are seen on a first come, first serve basis.    [place] Providers:  Organization         Address  Phone   Notes  [facility] [address] [place] [address],  [place] [phone] Also accepts self-pay patients.  [facility] [address], [place]  [phone]   [place], [address], [place] [phone]   [facility] [address] [place] [phone]   Lucianne Lei [address], [place]   [phone] Only accepts [place] patients after they have their name applied to their card.   Self-Pay (no insurance) in [facility]:  Organization         Address  Phone   Notes  Sickle Cell Patients, [facility] [place] [phone]   [facility] [place] [phone]   [facility] [place]  [place] [address], [place] [phone]) [phone]   °[facility]/Dr. Osei-Bonsu ° [address] [place] [address], [place] or [address], [place] [phone]) [phone] Phone number for both [place] and [place] locations is the same.  °[facility] [address], [place] ([phone]) [phone]   °[facility] [address] [place] [address], [place] or [address] ([phone]) [phone] °(336) [phone]   °[facility] [address], [place] (336) [phone], phone; ([phone], fax Sees patients 1st and 3rd Saturday of every month.  Must not qualify for public or private insurance (i.e. Medicaid, Medicare, [place] Health Choice, Veterans' Benefits) • Household income should be no more than 200% of the poverty level •The clinic cannot treat you if you are pregnant or think you are pregnant • Sexually transmitted diseases are not treated at the clinic.  ° ° °Dental Care: °Organization         Address  Phone  Notes  °[facility] [address], [place] ([phone]) [phone] Accepts children up to age 21 who are enrolled in  Medicaid or [place] Health Choice; pregnant women with a Medicaid card; and children who have applied for Medicaid or [place] Health Choice, but were declined, whose parents can pay a reduced fee at time of service.  °[facility] [place]  [address], [place] ([phone]) [phone] Accepts children up to age 21 who are enrolled in Medicaid or [place] Health Choice; pregnant women with a Medicaid card; and children who have applied for Medicaid or [place] Health Choice, but were declined, whose parents can pay a reduced fee at time of service.  °[facility] ° [address], [place] ([phone]) [phone] Patients are seen by appointment only. Walk-ins are not accepted. [facility] will see patients 18 years of age and older. °Monday - Tuesday (8am-5pm) °Most Wednesdays (8:30-5pm) °$30 per visit, cash only  °[facility] ° [address], [place] ([phone]) [phone] Patients are seen by appointment only. Walk-ins are not accepted. [facility] will see patients 18 years of age and older. °One Wednesday Evening (Monthly: Volunteer Based).  $30 per visit, cash only  °[facility]  ([phone] for adults; Children under age 4, call Graduate Pediatric Dentistry at [phone]. Children aged 4-14, please call [phone] to request a pediatric application. ° Dental services are provided in all areas of dental care including fillings, crowns and bridges, complete and partial dentures, implants, gum treatment, root canals, and extractions. Preventive care is also provided. Treatment is provided to both adults and children. °Patients are selected via a lottery and there is often a waiting list. °  °[facility] [address], °[place] ° (336) 763-8833 [email or web address] °  °[facility] [address], [place], [place] (336)[phone], Ext. 123 Second and Fourth Thursday of each month, opens at 6:30  AM; Clinic ends at 9 AM.  Patients are seen on a first-come first-served basis, and a limited number are seen during each clinic.  ° °[facility] ° [address], [place], [place] ([phone]) [phone]   Eligibility Requirements °You must have lived in [place], [place], or [place] counties for at least the last three months. °  You cannot be eligible for state or federal sponsored healthcare insurance, including Veterans Administration, Medicaid, or Medicare. °    You generally cannot be eligible for healthcare insurance through your employer.  °  How to apply: °Eligibility screenings are held every Tuesday and Wednesday afternoon from 1:00 pm until 4:00 pm. You do not need an appointment for the interview!  °[facility] [address], [place], [place] [phone]   °[facility]  336-342-8273   °[facility]  336-703-3100   °[facility]  336-570-6415   ° °Behavioral Health Resources in the Community: °Intensive Outpatient Programs °Organization         Address  Phone  Notes  °[facility] [address], [place], [place] [phone]   °[facility] [address], [place], [place] [phone]   °[facility] [address] [place] [address], [place], [place] ° 336-882-2125   °[facility] [address],  °[place], [place] 1-800-853-5163 or 336-641-4981   °Substance Abuse Resources °Organization         Address  Phone  Notes  °[facility]  336-882-2125   °[facility]  336-784-9470   °[facility]  336-285-9073   °[facility]  336-845-3988   °Residential & Outpatient Substance Abuse Program  1-800-659-3381   °Psychological Services °Organization         Address  Phone  Notes  °[facility]  336- 832-9600   °[facility]  336- 378-7881   °[facility] [address], [place] 1-800-853-5163 or  336-641-4981   ° °Mobile Crisis Teams °Organization         Address  Phone  Notes  °[facility], Mobile Crisis Care Unit  1-877-626-1772   °Assertive °[facility] ° [address] [place], [place] 336-834-9664   °Sharon DeEsch [address] °[place] 336-554-5454   ° °Self-Help/Support Groups °Organization         Address  Phone             Notes  °[facility] [place] - variety of support groups  336- 373-1402 Call for more information  °Narcotics Anonymous (NA), [facility] [address], °[place]  2 meetings at this location  ° °Residential Treatment Programs °Organization         Address  Phone  Notes  °[facility] [address],    °[place]  1-866-801-8205   °[facility] ° [address], [place], [place] 704-293-8524   °[facility] [address], [place] 336-845-3988 Admissions: 8am-3pm M-F  °[facility] [address]    °[place], [place] 336-841-1104   °[facility] [address], [place], [place] 336-379-7146   °[facility] [address]  °[place], [place] 336-285-9073   °[facility] - Intensive Outpatient [address], [place], [place] 336-852-3033   °[facility] [address]  °[place], [place] 1-877-615-2722 or 336-784-9470   °[facility] ([facility]) [address] , [place] 336-227-7417 Accepts Medicaid  °[facility] [address]  °[place] 1-800-659-3381 Substance Abuse/Addiction Treatment  ° °[place] [facility] Resources °Organization         Address  Phone  Notes  °[facility]  (888) 581-9988   °Julie Brannon, PhD [address] [place], [place]   (336) 349-5553 or (336) 951-0000   °[facility]   [address] °[place], [place] (336) 349-4454   °[facility] [address],  [place], [place] (336) 342-8316 Insurance/Medicaid/sponsorship through [facility]  °[facility] [address]                                    [place], [place] (336) 342-8316 Therapy/tele-psych/case  °[facility] [address]  ° [place], [place] (336) 349-2233    °Dr. Arfeen  (336) 349-4544   °[facility] [place] [facility]  [employer] [facility] 1) [address], [place] °2) [address], [place] °3)  [address], [place] (336) 349-3220 °(336) 342-7768 ° °(336) 342-8140   °[place] [facility] (336) 342-1394 or (336) 342-3537 (After Hours)    ° ° ° °

## 2013-08-19 NOTE — ED Provider Notes (Signed)
Medical screening examination/treatment/procedure(s) were performed by non-physician practitioner and as supervising physician I was immediately available for consultation/collaboration.    Teressa Lower, MD 08/19/13 [phone]

## 2013-08-19 NOTE — ED Provider Notes (Signed)
CSN: [id number]     Arrival date & time 08/19/13  0053 History   First MD Initiated Contact with Patient 08/19/13 0119     Chief Complaint  Patient presents with  . Dental Problem     (Consider location/radiation/quality/duration/timing/severity/associated sxs/prior Treatment) HPI Comments: The patient is a 48 year old otherwise healthy female who presents with dental pain that started gradually today. The dental pain is severe, constant and progressively worsening. The pain is aching and located in right upper jaw. The pain radiates to the right side of her face. Eating makes the pain worse. Nothing makes the pain better. Patient was seen in the ED earlier by me for the same problem. Patient reports worsening symptoms. The patient has tried Percocet for pain. Patient reports associated facial swelling. Patient denies headache, neck pain/stiffness, fever, NVD, edema, sore throat, throat swelling, wheezing, SOB, chest pain, abdominal pain.      Past Medical History  Diagnosis Date  . Kidney stone    Past Surgical History  Procedure Laterality Date  . Tubal ligation     No family history on file. History  Substance Use Topics  . Smoking status: Current Every Day Smoker    Types: Cigarettes  . Smokeless tobacco: Not on file  . Alcohol Use: No   OB History   Grav Para Term Preterm Abortions TAB SAB Ect Mult Living                 Review of Systems  Constitutional: Negative for fever, chills and fatigue.  HENT: Positive for dental problem and facial swelling. Negative for trouble swallowing.   Eyes: Negative for visual disturbance.  Respiratory: Negative for shortness of breath.   Cardiovascular: Negative for chest pain and palpitations.  Gastrointestinal: Negative for nausea, vomiting, abdominal pain and diarrhea.  Genitourinary: Negative for dysuria and difficulty urinating.  Musculoskeletal: Negative for arthralgias and neck pain.  Skin: Negative for color change.   Neurological: Negative for dizziness and weakness.  Psychiatric/Behavioral: Negative for dysphoric mood.      Allergies  Review of patient's allergies indicates no known allergies.  Home Medications   Current Outpatient Rx  Name  Route  Sig  Dispense  Refill  . oxyCODONE-acetaminophen (PERCOCET/ROXICET) 5-325 MG per tablet   Oral   Take 2 tablets by mouth every 4 (four) hours as needed for severe pain.   12 tablet   0   . penicillin v potassium (VEETID) 500 MG tablet   Oral   Take 1 tablet (500 mg total) by mouth 4 (four) times daily.   40 tablet   0    BP 112/62  Pulse 62  Temp(Src) 98.2 F (36.8 C) (Oral)  Resp 14  SpO2 100% Physical Exam  Nursing note and vitals reviewed. Constitutional: She is oriented to person, place, and time. She appears well-developed and well-nourished. No distress.  HENT:  Head: Normocephalic and atraumatic.  Mouth/Throat: Oropharynx is clear and moist. No oropharyngeal exudate.  Poor dentition. Right upper incisor tender to percussion. Right maxillary area erythematous, edematous, and tender to palpation.   Eyes: Conjunctivae and EOM are normal.  Neck: Normal range of motion.  Cardiovascular: Normal rate and regular rhythm.  Exam reveals no gallop and no friction rub.   No murmur heard. Pulmonary/Chest: Effort normal and breath sounds normal. She has no wheezes. She has no rales. She exhibits no tenderness.  Musculoskeletal: Normal range of motion.  Lymphadenopathy:    She has no cervical adenopathy.  Neurological: She  is alert and oriented to person, place, and time. Coordination normal.  Speech is goal-oriented. Moves limbs without ataxia.   Skin: Skin is warm and dry.  Psychiatric: She has a normal mood and affect. Her behavior is normal.    ED Course  Procedures (including critical care time) Labs Review Labs Reviewed  CBC WITH DIFFERENTIAL - Abnormal; Notable for the following:    Monocytes Absolute 1.1 (*)    All other  components within normal limits  BASIC METABOLIC PANEL - Abnormal; Notable for the following:    Glucose, Bld 119 (*)    GFR calc non Af Amer 67 (*)    GFR calc Af Amer 78 (*)    All other components within normal limits   Imaging Review Ct Maxillofacial W/cm  08/19/2013   CLINICAL DATA:  Right-sided pain and swelling.  EXAM: CT MAXILLOFACIAL WITH CONTRAST  TECHNIQUE: Multidetector CT imaging of the maxillofacial structures was performed with intravenous contrast. Multiplanar CT image reconstructions were also generated. A small metallic BB was placed on the right temple in order to reliably differentiate right from left.  CONTRAST:  132mL OMNIPAQUE IOHEXOL 300 MG/ML  SOLN  COMPARISON:  None available for comparison at time of study interpretation.  FINDINGS: Markedly right facial soft tissue swelling without subcutaneous gas or radiopaque foreign bodies. Right posterior mandible caries and periapical abscess and, in addition to tooth approximate 6 periapical abscess. No drainable fluid collections within the face. Right facial hyperemia with enlarged veins. No postseptal inflammatory changes, the ocular globes and orbital contents are nonsuspicious. Symmetric mildly prominent superior ophthalmic may veins may reflect Valsalva.  Mild right maxillary mucosal thickening without paranasal sinus air-fluid levels. Trace right frontal mucosal thickening extent of the right anterior ethmoid. Left concha bullosa.  Included view of the neck is unremarkable.  IMPRESSION: Right facial swelling without subcutaneous gas or abscess, this may be odontogenic in origin as there are multiple right periapical abscess ease.   Electronically Signed   By: Elon Alas   On: 08/19/2013 04:17    EKG Interpretation   None       MDM   Final diagnoses:  Dental abscess    1:29 AM Patient's face appears more swollen from earlier. Vitals stable and patient afebrile. Patient will have labs, pain medication and CT  face.   Patient's signed out to Antonietta Breach, [place], PA-C 08/19/13 1345

## 2013-08-19 NOTE — ED Notes (Signed)
Pt. arrived with PTAR from home , reports persistent right upper premolar pain with swelling , seen here today for the same complaints discharged home with prescriptions .

## 2013-09-11 ENCOUNTER — Emergency Department (HOSPITAL_COMMUNITY): Payer: Self-pay

## 2013-09-11 ENCOUNTER — Emergency Department (HOSPITAL_COMMUNITY)
Admission: EM | Admit: 2013-09-11 | Discharge: 2013-09-11 | Disposition: A | Discharge status: Home / Self Care | Payer: Self-pay | Attending: Emergency Medicine | Admitting: Emergency Medicine

## 2013-09-11 ENCOUNTER — Encounter (HOSPITAL_COMMUNITY): Payer: Self-pay | Admitting: Emergency Medicine

## 2013-09-11 DIAGNOSIS — F17200 Nicotine dependence, unspecified, uncomplicated: Secondary | ICD-10-CM | POA: Insufficient documentation

## 2013-09-11 DIAGNOSIS — Z87442 Personal history of urinary calculi: Secondary | ICD-10-CM | POA: Insufficient documentation

## 2013-09-11 DIAGNOSIS — H539 Unspecified visual disturbance: Secondary | ICD-10-CM | POA: Insufficient documentation

## 2013-09-11 DIAGNOSIS — R42 Dizziness and giddiness: Secondary | ICD-10-CM | POA: Insufficient documentation

## 2013-09-11 DIAGNOSIS — R51 Headache: Secondary | ICD-10-CM | POA: Insufficient documentation

## 2013-09-11 DIAGNOSIS — R55 Syncope and collapse: Secondary | ICD-10-CM | POA: Insufficient documentation

## 2013-09-11 LAB — COMPREHENSIVE METABOLIC PANEL
ALBUMIN: 3.7 g/dL (ref 3.5–5.2)
ALK PHOS: 65 U/L (ref 39–117)
ALT: 11 U/L (ref 0–35)
AST: 19 U/L (ref 0–37)
BUN: 8 mg/dL (ref 6–23)
CALCIUM: 9 mg/dL (ref 8.4–10.5)
CO2: 23 mEq/L (ref 19–32)
Chloride: 106 mEq/L (ref 96–112)
Creatinine, Ser: 0.81 mg/dL (ref 0.50–1.10)
GFR calc Af Amer: >90 mL/min (ref >90)
GFR calc non Af Amer: 86 mL/min — ABNORMAL LOW (ref >90)
Glucose, Bld: 81 mg/dL (ref 70–99)
POTASSIUM: 4.1 meq/L (ref 3.7–5.3)
SODIUM: 141 meq/L (ref 137–147)
Total Bilirubin: <0.2 mg/dL — ABNORMAL LOW (ref 0.3–1.2)
Total Protein: 7 g/dL (ref 6.0–8.3)

## 2013-09-11 LAB — CBC WITH DIFFERENTIAL/PLATELET
BASOS PCT: 1 % (ref 0–1)
Basophils Absolute: 0 10*3/uL (ref 0.0–0.1)
EOS ABS: 0.1 10*3/uL (ref 0.0–0.7)
EOS PCT: 2 % (ref 0–5)
HCT: 38.7 % (ref 36.0–46.0)
Hemoglobin: 13.6 g/dL (ref 12.0–15.0)
Lymphocytes Relative: 45 % (ref 12–46)
Lymphs Abs: 2.8 10*3/uL (ref 0.7–4.0)
MCH: 32.5 pg (ref 26.0–34.0)
MCHC: 35.1 g/dL (ref 30.0–36.0)
MCV: 92.6 fL (ref 78.0–100.0)
MONOS PCT: 7 % (ref 3–12)
Monocytes Absolute: 0.5 10*3/uL (ref 0.1–1.0)
Neutro Abs: 2.8 10*3/uL (ref 1.7–7.7)
Neutrophils Relative %: 45 % (ref 43–77)
PLATELETS: 291 10*3/uL (ref 150–400)
RBC: 4.18 MIL/uL (ref 3.87–5.11)
RDW: 12.8 % (ref 11.5–15.5)
WBC: 6.3 10*3/uL (ref 4.0–10.5)

## 2013-09-11 LAB — URINALYSIS, ROUTINE W REFLEX MICROSCOPIC
Bilirubin Urine: NEGATIVE
Glucose, UA: NEGATIVE mg/dL
HGB URINE DIPSTICK: NEGATIVE
Ketones, ur: NEGATIVE mg/dL
Leukocytes, UA: NEGATIVE
NITRITE: NEGATIVE
PROTEIN: NEGATIVE mg/dL
SPECIFIC GRAVITY, URINE: 1.008 (ref 1.005–1.030)
Urobilinogen, UA: 0.2 mg/dL (ref 0.0–1.0)
pH: 6 (ref 5.0–8.0)

## 2013-09-11 LAB — CBG MONITORING, ED: GLUCOSE-CAPILLARY: 86 mg/dL (ref 70–99)

## 2013-09-11 LAB — PREGNANCY, URINE: Preg Test, Ur: NEGATIVE

## 2013-09-11 LAB — TROPONIN I

## 2013-09-11 NOTE — ED Provider Notes (Signed)
CSN: [id number]     Arrival date & time 09/11/13  1422 History   First MD Initiated Contact with Patient 09/11/13 1652     Chief Complaint  Patient presents with  . Leg Swelling    HPI  Katie Sherman is a 48 y.o. female with a PMH of kidney stones who presents to the ED for evaluation of leg swelling. History was provided by the patient. Patient states that she has had leg swelling for the past month. Swelling is equal bilaterally with fluctuations in severity. Has never had leg swelling in the past and the patient is concerned about this. No injuries or trauma. Denies any swelling currently. No myalgias or arthralgias. Patient also states that on 09/06/13 she was sitting when when she suddenly felt lightheaded and had blurry vision in the left eye. She states she got up to walk outside for some fresh air when her lightheadedness worsened and she fell down on her buttocks. No leg weakness, slurred speech, confusion, facial drooping, or syncope. States she did not lose consciousness or sustain a head injury. She states her friends helped her inside and she returned to baseline within minutes. Patient states that she was doing well until last night when she developed lightheadedness, flushing across her body and blurry vision in the left eye. She did not attempt to walk. She states her symptoms resolved and she is asymptomatic currently. She denies any headaches, vomiting, chest pain, SOB, rhinorrhea, cough, sore throat, abdominal pain, diarrhea, constipation, or dysuria.      Past Medical History  Diagnosis Date  . Kidney stone    Past Surgical History  Procedure Laterality Date  . Tubal ligation     History reviewed. No pertinent family history. History  Substance Use Topics  . Smoking status: Current Every Day Smoker    Types: Cigarettes  . Smokeless tobacco: Not on file  . Alcohol Use: No   OB History   Grav Para Term Preterm Abortions TAB SAB Ect Mult Living                 Review  of Systems  Constitutional: Negative for fever, chills, diaphoresis, activity change, appetite change and fatigue.  HENT: Negative for congestion, rhinorrhea and sore throat.   Eyes: Positive for visual disturbance. Negative for photophobia and pain.  Respiratory: Negative for cough and shortness of breath.   Cardiovascular: Positive for leg swelling. Negative for chest pain.  Gastrointestinal: Negative for nausea, vomiting, abdominal pain, diarrhea and constipation.  Genitourinary: Negative for dysuria.  Musculoskeletal: Negative for back pain, myalgias and neck pain.  Skin: Negative for color change and wound.  Neurological: Positive for light-headedness. Negative for dizziness, tremors, syncope, facial asymmetry, speech difficulty, weakness, numbness and headaches.  Psychiatric/Behavioral: Negative for confusion.    Allergies  Review of patient's allergies indicates no known allergies.  Home Medications  No current outpatient prescriptions on file. BP 127/71  Pulse 58  Temp(Src) 97.3 F (36.3 C)  Resp 18  SpO2 100%  Filed Vitals:   09/11/13 1435 09/11/13 1823 09/11/13 1824 09/11/13 1825  BP: 127/71 129/86 118/69 135/68  Pulse: 58 55 50 54  Temp: 97.3 F (36.3 C)     Resp: 18     SpO2: 100%      Physical Exam  Nursing note and vitals reviewed. Constitutional: She is oriented to person, place, and time. She appears well-developed and well-nourished. No distress.  HENT:  Head: Normocephalic and atraumatic.  Right Ear: External ear normal.  Left Ear: External ear normal.  Nose: Nose normal.  Mouth/Throat: Oropharynx is clear and moist. No oropharyngeal exudate.  No tenderness to the scalp or face throughout. No palpable hematoma, step-offs, or lacerations throughout.  Tympanic membranes gray and translucent bilaterally.    Eyes: Conjunctivae and EOM are normal. Pupils are equal, round, and reactive to light. Right eye exhibits no discharge. Left eye exhibits no discharge.   Neck: Normal range of motion. Neck supple.  No cervical spinal or paraspinal tenderness to palpation throughout.  No limitations with neck ROM.    Cardiovascular: Normal rate, regular rhythm and normal heart sounds.  Exam reveals no gallop and no friction rub.   No murmur heard. Pulmonary/Chest: Effort normal and breath sounds normal. No respiratory distress. She has no wheezes. She has no rales. She exhibits no tenderness.  Abdominal: Soft. Bowel sounds are normal. She exhibits no distension and no mass. There is no tenderness. There is no rebound and no guarding.  Musculoskeletal: Normal range of motion. She exhibits no edema and no tenderness.  No LE edema or calf tenderness bilaterally. No tenderness to palpation to the thoracic or lumbar spinous processes throughout.  No tenderness to palpation to the paraspinal muscles throughout. No tenderness to palpation to the upper or lower extremities throughout. Strength 5/5 in the upper and lower extremities bilaterally. Patient able to ambulate without difficulty or ataxia.  Neurological: She is alert and oriented to person, place, and time.  GCS 15.  No focal neurological deficits.  CN 2-12 intact.  No pronator drift.  Finger to nose intact.  Heel to shin intact.    Skin: Skin is warm and dry. She is not diaphoretic.    ED Course  Procedures (including critical care time) Labs Review Labs Reviewed  COMPREHENSIVE METABOLIC PANEL - Abnormal; Notable for the following:    Total Bilirubin <0.2 (*)    GFR calc non Af Amer 86 (*)    All other components within normal limits  CBC WITH DIFFERENTIAL  CBG MONITORING, ED   Imaging Review No results found.   EKG Interpretation   Date/Time:  Thursday September 11 2013 14:37:34 EST Ventricular Rate:  55 PR Interval:  126 QRS Duration: 80 QT Interval:  418 QTC Calculation: 399 R Axis:   71 Text Interpretation:  Sinus bradycardia Low voltage QRS Borderline ECG No  significant change since last  tracing Confirmed by POLLINA  MD,  CHRISTOPHER [phone]) on 09/11/2013 5:49:00 PM      Results for orders placed during the hospital encounter of 09/11/13  CBC WITH DIFFERENTIAL      Result Value Ref Range   WBC 6.3  4.0 - 10.5 K/uL   RBC 4.18  3.87 - 5.11 MIL/uL   Hemoglobin 13.6  12.0 - 15.0 g/dL   HCT 38.7  36.0 - 46.0 %   MCV 92.6  78.0 - 100.0 fL   MCH 32.5  26.0 - 34.0 pg   MCHC 35.1  30.0 - 36.0 g/dL   RDW 12.8  11.5 - 15.5 %   Platelets 291  150 - 400 K/uL   Neutrophils Relative % 45  43 - 77 %   Neutro Abs 2.8  1.7 - 7.7 K/uL   Lymphocytes Relative 45  12 - 46 %   Lymphs Abs 2.8  0.7 - 4.0 K/uL   Monocytes Relative 7  3 - 12 %   Monocytes Absolute 0.5  0.1 - 1.0 K/uL   Eosinophils Relative 2  0 -  5 %   Eosinophils Absolute 0.1  0.0 - 0.7 K/uL   Basophils Relative 1  0 - 1 %   Basophils Absolute 0.0  0.0 - 0.1 K/uL  COMPREHENSIVE METABOLIC PANEL      Result Value Ref Range   Sodium 141  137 - 147 mEq/L   Potassium 4.1  3.7 - 5.3 mEq/L   Chloride 106  96 - 112 mEq/L   CO2 23  19 - 32 mEq/L   Glucose, Bld 81  70 - 99 mg/dL   BUN 8  6 - 23 mg/dL   Creatinine, Ser 0.81  0.50 - 1.10 mg/dL   Calcium 9.0  8.4 - 10.5 mg/dL   Total Protein 7.0  6.0 - 8.3 g/dL   Albumin 3.7  3.5 - 5.2 g/dL   AST 19  0 - 37 U/L   ALT 11  0 - 35 U/L   Alkaline Phosphatase 65  39 - 117 U/L   Total Bilirubin <0.2 (*) 0.3 - 1.2 mg/dL   GFR calc non Af Amer 86 (*) >90 mL/min   GFR calc Af Amer >90  >90 mL/min  TROPONIN I      Result Value Ref Range   Troponin I <0.30  <0.30 ng/mL  PREGNANCY, URINE      Result Value Ref Range   Preg Test, Ur NEGATIVE  NEGATIVE  URINALYSIS, ROUTINE W REFLEX MICROSCOPIC      Result Value Ref Range   Color, Urine YELLOW  YELLOW   APPearance HAZY (*) CLEAR   Specific Gravity, Urine 1.008  1.005 - 1.030   pH 6.0  5.0 - 8.0   Glucose, UA NEGATIVE  NEGATIVE mg/dL   Hgb urine dipstick NEGATIVE  NEGATIVE   Bilirubin Urine NEGATIVE  NEGATIVE   Ketones, ur  NEGATIVE  NEGATIVE mg/dL   Protein, ur NEGATIVE  NEGATIVE mg/dL   Urobilinogen, UA 0.2  0.0 - 1.0 mg/dL   Nitrite NEGATIVE  NEGATIVE   Leukocytes, UA NEGATIVE  NEGATIVE  CBG MONITORING, ED      Result Value Ref Range   Glucose-Capillary 86  70 - 99 mg/dL     CT Head Wo Contrast (Final result)  Result time: 09/11/13 18:25:33    Final result by Rad Results In Interface (09/11/13 18:25:33)    Narrative:   CLINICAL DATA: Syncope. Passed out. Weakness in all extremities.  EXAM: CT HEAD WITHOUT CONTRAST  TECHNIQUE: Contiguous axial images were obtained from the base of the skull through the vertex without intravenous contrast.  COMPARISON: CT MAXILLOFACIAL W/CM dated 08/19/2013  FINDINGS: No mass lesion, mass effect, midline shift, hydrocephalus, hemorrhage. No territorial ischemia or acute infarction. Paranasal sinuses are within normal limits. Mastoid air cells clear.  IMPRESSION: Negative CT head.   Electronically Signed By: Dereck Ligas M.D. On: 09/11/2013 18:25          MDM   Katie Sherman is a 48 y.o. female with a PMH of kidney stones who presents to the ED for evaluation of leg swelling.   Rechecks  7:39 PM = Patient asymptomatic. No concerns.    Patient evaluated for two possible pre-syncopal episodes. Head CT negative for an acute intracranial abnormality. No neurological deficits on exam. Labs unremarkable. EKG negative for any acute ischemic changes. Mild asymptomatic bradycardia (50-60's), which has been present during previous visits to the ED. Instructed patient to follow-up with cardiology for possible holter monitor. Troponin negative. Patient asymptomatic throughout her ED visit. Patient also complained of bilateral  leg edema which is intermittent with fluctuations in severity. No leg edema on exam. Instructed to follow-up with PCP. Return precautions, discharge instructions, and follow-up was discussed with the patient before discharge.     There  are no discharge medications for this patient.   Final impressions: 1. Pre-syncope       Mercy Moore PA-C   This patient was discussed with Dr. Doristine Counter, PA-C 09/13/13 [phone]

## 2013-09-11 NOTE — ED Notes (Addendum)
Per pt sts over the past month she has been having intermittent swelling in legs and feet along with 2 pre syncopal episodes. sts some pain in feet. sts her mother is a diabetic. sts she was sitting at the table and her vision became blurry and she started sweating right before the pre syncope episodes.

## 2013-09-11 NOTE — Discharge Instructions (Signed)
Follow-up with cardiology - you may require a Holter monitor  Stop smoking - see below for resources  Drink plenty of fluids and rest  Return to the emergency department if you develop any changing/worsening condition, passing out, weakness, confusion, loss of sensation, fever, difficulty walking/speaking, difficulty breathing, chest pain, severe headache, vision changes, numbness/tingling,  or any other concerns (please read additional information regarding your condition below)    Syncope Syncope is a fainting spell. This means the person loses consciousness and drops to the ground. The person is generally unconscious for less than 5 minutes. The person may have some muscle twitches for up to 15 seconds before waking up and returning to normal. Syncope occurs more often in elderly people, but it can happen to anyone. While most causes of syncope are not dangerous, syncope can be a sign of a serious medical problem. It is important to seek medical care.  CAUSES  Syncope is caused by a sudden decrease in blood flow to the brain. The specific cause is often not determined. Factors that can trigger syncope include:  Taking medicines that lower blood pressure.  Sudden changes in posture, such as standing up suddenly.  Taking more medicine than prescribed.  Standing in one place for too long.  Seizure disorders.  Dehydration and excessive exposure to heat.  Low blood sugar (hypoglycemia).  Straining to have a bowel movement.  Heart disease, irregular heartbeat, or other circulatory problems.  Fear, emotional distress, seeing blood, or severe pain. SYMPTOMS  Right before fainting, you may:  Feel dizzy or lightheaded.  Feel nauseous.  See all white or all black in your field of vision.  Have cold, clammy skin. DIAGNOSIS  Your caregiver will ask about your symptoms, perform a physical exam, and perform electrocardiography (ECG) to record the electrical activity of your heart. Your  caregiver may also perform other heart or blood tests to determine the cause of your syncope. TREATMENT  In most cases, no treatment is needed. Depending on the cause of your syncope, your caregiver may recommend changing or stopping some of your medicines. HOME CARE INSTRUCTIONS  Have someone stay with you until you feel stable.  Do not drive, operate machinery, or play sports until your caregiver says it is okay.  Keep all follow-up appointments as directed by your caregiver.  Lie down right away if you start feeling like you might faint. Breathe deeply and steadily. Wait until all the symptoms have passed.  Drink enough fluids to keep your urine clear or pale yellow.  If you are taking blood pressure or heart medicine, get up slowly, taking several minutes to sit and then stand. This can reduce dizziness. SEEK IMMEDIATE MEDICAL CARE IF:   You have a severe headache.  You have unusual pain in the chest, abdomen, or back.  You are bleeding from the mouth or rectum, or you have black or tarry stool.  You have an irregular or very fast heartbeat.  You have pain with breathing.  You have repeated fainting or seizure-like jerking during an episode.  You faint when sitting or lying down.  You have confusion.  You have difficulty walking.  You have severe weakness.  You have vision problems. If you fainted, call your local emergency services (911 in [place] Do not drive yourself to the hospital.  MAKE SURE YOU:  Understand these instructions.  Will watch your condition.  Will get help right away if you are not doing well or get worse. Document Released: 06/26/2005 Document  Revised: 12/26/2011 Document Reviewed: 08/25/2011 [facility] Patient Information 2014 [place], [place].   Peripheral Edema You have swelling in your legs (peripheral edema). This swelling is due to excess accumulation of salt and water in your body. Edema may be a sign of heart, kidney or liver disease,  or a side effect of a medication. It may also be due to problems in the leg veins. Elevating your legs and using special support stockings may be very helpful, if the cause of the swelling is due to poor venous circulation. Avoid long periods of standing, whatever the cause. Treatment of edema depends on identifying the cause. Chips, pretzels, pickles and other salty foods should be avoided. Restricting salt in your diet is almost always needed. Water pills (diuretics) are often used to remove the excess salt and water from your body via urine. These medicines prevent the kidney from reabsorbing sodium. This increases urine flow. Diuretic treatment may also result in lowering of potassium levels in your body. Potassium supplements may be needed if you have to use diuretics daily. Daily weights can help you keep track of your progress in clearing your edema. You should call your caregiver for follow up care as recommended. SEEK IMMEDIATE MEDICAL CARE IF:   You have increased swelling, pain, redness, or heat in your legs.  You develop shortness of breath, especially when lying down.  You develop chest or abdominal pain, weakness, or fainting.  You have a fever. Document Released: 08/03/2004 Document Revised: 09/18/2011 Document Reviewed: 07/14/2009 [facility] Patient Information 2014 [place].  Edema Edema is an abnormal build-up of fluids in tissues. Because this is partly dependent on gravity (water flows to the lowest place), it is more common in the legs and thighs (lower extremities). It is also common in the looser tissues, like around the eyes. Painless swelling of the feet and ankles is common and increases as a person ages. It may affect both legs and may include the calves or even thighs. When squeezed, the fluid may move out of the affected area and may leave a dent for a few moments. CAUSES   Prolonged standing or sitting in one place for extended periods of time. Movement helps  pump tissue fluid into the veins, and absence of movement prevents this, resulting in edema.  Varicose veins. The valves in the veins do not work as well as they should. This causes fluid to leak into the tissues.  Fluid and salt overload.  Injury, burn, or surgery to the leg, ankle, or foot, may damage veins and allow fluid to leak out.  Sunburn damages vessels. Leaky vessels allow fluid to go out into the sunburned tissues.  Allergies (from insect bites or stings, medications or chemicals) cause swelling by allowing vessels to become leaky.  Protein in the blood helps keep fluid in your vessels. Low protein, as in malnutrition, allows fluid to leak out.  Hormonal changes, including pregnancy and menstruation, cause fluid retention. This fluid may leak out of vessels and cause edema.  Medications that cause fluid retention. Examples are sex hormones, blood pressure medications, steroid treatment, or anti-depressants.  Some illnesses cause edema, especially heart failure, kidney disease, or liver disease.  Surgery that cuts veins or lymph nodes, such as surgery done for the heart or for breast cancer, may result in edema. DIAGNOSIS  Your caregiver is usually easily able to determine what is causing your swelling (edema) by simply asking what is wrong (getting a history) and examining you (doing a physical). Sometimes  x-rays, EKG (electrocardiogram or heart tracing), and blood work may be done to evaluate for underlying medical illness. TREATMENT  General treatment includes:  Leg elevation (or elevation of the affected body part).  Restriction of fluid intake.  Prevention of fluid overload.  Compression of the affected body part. Compression with elastic bandages or support stockings squeezes the tissues, preventing fluid from entering and forcing it back into the blood vessels.  Diuretics (also called water pills or fluid pills) pull fluid out of your body in the form of increased  urination. These are effective in reducing the swelling, but can have side effects and must be used only under your caregiver's supervision. Diuretics are appropriate only for some types of edema. The specific treatment can be directed at any underlying causes discovered. Heart, liver, or kidney disease should be treated appropriately. HOME CARE INSTRUCTIONS   Elevate the legs (or affected body part) above the level of the heart, while lying down.  Avoid sitting or standing still for prolonged periods of time.  Avoid putting anything directly under the knees when lying down, and do not wear constricting clothing or garters on the upper legs.  Exercising the legs causes the fluid to work back into the veins and lymphatic channels. This may help the swelling go down.  The pressure applied by elastic bandages or support stockings can help reduce ankle swelling.  A low-salt diet may help reduce fluid retention and decrease the ankle swelling.  Take any medications exactly as prescribed. SEEK MEDICAL CARE IF:  Your edema is not responding to recommended treatments. SEEK IMMEDIATE MEDICAL CARE IF:   You develop shortness of breath or chest pain.  You cannot breathe when you lay down; or if, while lying down, you have to get up and go to the window to get your breath.  You are having increasing swelling without relief from treatment.  You develop a fever over 102 F (38.9 C).  You develop pain or redness in the areas that are swollen.  Tell your caregiver right away if you have gained 03 lb/1.4 kg in 1 day or 05 lb/2.3 kg in a week. MAKE SURE YOU:   Understand these instructions.  Will watch your condition.  Will get help right away if you are not doing well or get worse. Document Released: 06/26/2005 Document Revised: 12/26/2011 Document Reviewed: 02/12/2008 [facility] Patient Information 2014 [place].  Smoking Cessation Quitting smoking is important to your health and has  many advantages. However, it is not always easy to quit since nicotine is a very addictive drug. Often times, people try 3 times or more before being able to quit. This document explains the best ways for you to prepare to quit smoking. Quitting takes hard work and a lot of effort, but you can do it. ADVANTAGES OF QUITTING SMOKING  You will live longer, feel better, and live better.  Your body will feel the impact of quitting smoking almost immediately.  Within 20 minutes, blood pressure decreases. Your pulse returns to its normal level.  After 8 hours, carbon monoxide levels in the blood return to normal. Your oxygen level increases.  After 24 hours, the chance of having a heart attack starts to decrease. Your breath, hair, and body stop smelling like smoke.  After 48 hours, damaged nerve endings begin to recover. Your sense of taste and smell improve.  After 72 hours, the body is virtually free of nicotine. Your bronchial tubes relax and breathing becomes easier.  After 2  to 12 weeks, lungs can hold more air. Exercise becomes easier and circulation improves.  The risk of having a heart attack, stroke, cancer, or lung disease is greatly reduced.  After 1 year, the risk of coronary heart disease is cut in half.  After 5 years, the risk of stroke falls to the same as a nonsmoker.  After 10 years, the risk of lung cancer is cut in half and the risk of other cancers decreases significantly.  After 15 years, the risk of coronary heart disease drops, usually to the level of a nonsmoker.  If you are pregnant, quitting smoking will improve your chances of having a healthy baby.  The people you live with, especially any children, will be healthier.  You will have extra money to spend on things other than cigarettes. QUESTIONS TO THINK ABOUT BEFORE ATTEMPTING TO QUIT You may want to talk about your answers with your caregiver.  Why do you want to quit?  If you tried to quit in the past,  what helped and what did not?  What will be the most difficult situations for you after you quit? How will you plan to handle them?  Who can help you through the tough times? Your family? Friends? A caregiver?  What pleasures do you get from smoking? What ways can you still get pleasure if you quit? Here are some questions to ask your caregiver:  How can you help me to be successful at quitting?  What medicine do you think would be best for me and how should I take it?  What should I do if I need more help?  What is smoking withdrawal like? How can I get information on withdrawal? GET READY  Set a quit date.  Change your environment by getting rid of all cigarettes, ashtrays, matches, and lighters in your home, car, or work. Do not let people smoke in your home.  Review your past attempts to quit. Think about what worked and what did not. GET SUPPORT AND ENCOURAGEMENT You have a better chance of being successful if you have help. You can get support in many ways.  Tell your family, friends, and co-workers that you are going to quit and need their support. Ask them not to smoke around you.  Get individual, group, or telephone counseling and support. Programs are available at [employer] and health centers. Call your local health department for information about programs in your area.  Spiritual beliefs and practices may help some smokers quit.  Download a "quit meter" on your computer to keep track of quit statistics, such as how long you have gone without smoking, cigarettes not smoked, and money saved.  Get a self-help book about quitting smoking and staying off of tobacco. [place] yourself from urges to smoke. Talk to someone, go for a walk, or occupy your time with a task.  Change your normal routine. Take a different route to work. Drink tea instead of coffee. Eat breakfast in a different place.  Reduce your stress. Take a hot bath,  exercise, or read a book.  Plan something enjoyable to do every day. Reward yourself for not smoking.  Explore interactive web-based programs that specialize in helping you quit. GET MEDICINE AND USE IT CORRECTLY Medicines can help you stop smoking and decrease the urge to smoke. Combining medicine with the above behavioral methods and support can greatly increase your chances of successfully quitting smoking.  Nicotine replacement therapy helps deliver nicotine  to your body without the negative effects and risks of smoking. Nicotine replacement therapy includes nicotine gum, lozenges, inhalers, nasal sprays, and skin patches. Some may be available over-the-counter and others require a prescription.  Antidepressant medicine helps people abstain from smoking, but how this works is unknown. This medicine is available by prescription.  Nicotinic receptor partial agonist medicine simulates the effect of nicotine in your brain. This medicine is available by prescription. Ask your caregiver for advice about which medicines to use and how to use them based on your health history. Your caregiver will tell you what side effects to look out for if you choose to be on a medicine or therapy. Carefully read the information on the package. Do not use any other product containing nicotine while using a nicotine replacement product.  RELAPSE OR DIFFICULT SITUATIONS Most relapses occur within the first 3 months after quitting. Do not be discouraged if you start smoking again. Remember, most people try several times before finally quitting. You may have symptoms of withdrawal because your body is used to nicotine. You may crave cigarettes, be irritable, feel very hungry, cough often, get headaches, or have difficulty concentrating. The withdrawal symptoms are only temporary. They are strongest when you first quit, but they will go away within 10 14 days. To reduce the chances of relapse, try to:  Avoid drinking  alcohol. Drinking lowers your chances of successfully quitting.  Reduce the amount of caffeine you consume. Once you quit smoking, the amount of caffeine in your body increases and can give you symptoms, such as a rapid heartbeat, sweating, and anxiety.  Avoid smokers because they can make you want to smoke.  Do not let weight gain distract you. Many smokers will gain weight when they quit, usually less than 10 pounds. Eat a healthy diet and stay active. You can always lose the weight gained after you quit.  Find ways to improve your mood other than smoking. FOR MORE INFORMATION  www.smokefree.gov  Document Released: 06/20/2001 Document Revised: 12/26/2011 Document Reviewed: 10/05/2011 [facility] Patient Information 2014 [place], [place].   Emergency Department Resource Guide 1) Find a Doctor and Pay Out of Pocket Although you won't have to find out who is covered by your insurance plan, it is a good idea to ask around and get recommendations. You will then need to call the office and see if the doctor you have chosen will accept you as a new patient and what types of options they offer for patients who are self-pay. Some doctors offer discounts or will set up payment plans for their patients who do not have insurance, but you will need to ask so you aren't surprised when you get to your appointment.  2) Contact Your Local Health Department Not all health departments have doctors that can see patients for sick visits, but many do, so it is worth a call to see if yours does. If you don't know where your local health department is, you can check in your phone book. The CDC also has a tool to help you locate your state's health department, and many state websites also have listings of all of their local health departments.  3) Find a [facility] If your illness is not likely to be very severe or complicated, you may want to try a walk in clinic. These are popping up all over the country in  pharmacies, drugstores, and shopping centers. They're usually staffed by nurse practitioners or physician assistants that have been trained to treat  common illnesses and complaints. They're usually fairly quick and inexpensive. However, if you have serious medical issues or chronic medical problems, these are probably not your best option.  No Primary Care Doctor: - Call Health Connect at  [phone] - they can help you locate a primary care doctor that  accepts your insurance, provides certain services, etc. - Physician Referral Service- [phone]  Chronic Pain Problems: Organization         Address  Phone   Notes  [facility]  [phone] Patients need to be referred by their primary care doctor.   Medication Assistance: Organization         Address  Phone   Notes  [facility] Medication [facility] [address] [place], [place] [address] [phone] --Must be a resident of [facility] -- Must have NO insurance coverage whatsoever (no Medicaid/ Medicare, etc.) -- The pt. MUST have a primary care doctor that directs their care regularly and follows them in the community   [facility]  [phone]   [employer]  [phone]    Agencies that provide inexpensive medical care: Organization         Address  Phone   Notes  [place]  [phone]   [facility]    [phone]   [facility] [place], [place] [address] [phone]   [place] [address] [place] [phone]   Planned Parenthood    [phone]   [facility]    [phone]   [place] and [address], [place] Phone:  [phone], Fax:  [phone] Hours of Operation:  9 am - 6 pm, M-F.  Also accepts Medicaid/Medicare and self-pay.  [facility] for [place] [address], [address],  [place] Phone: [phone], Fax: [phone]. Hours of Operation:  8:30 am - 5:30 pm, M-F.  Also accepts Medicaid and self-pay.  [facility] [place] [address] [place] Phone: [phone]   [place], [place], [place] [phone], Ext. [phone] Mondays & Thursdays: 7-9 AM.  First 15 patients are seen on a first come, first serve basis.    [place] Providers:  Organization         Address  Phone   Notes  [facility] [address], [place] [phone] Also accepts self-pay patients.  [facility] [id number] [place], [place]  [phone]   [place], [address], [place] [phone]   [facility] [address], [place] [phone]   Lucianne Lei [address], [place]   [phone] Only accepts [place] Access [place] patients after they have their name applied to their card.   Self-Pay (no insurance) in [facility]:  Organization         Address  Phone   Notes  Sickle Cell Patients, [facility] [place] [phone]   [facility] [place] [phone]   [facility] [place]  [place], [place], [place] [phone]   [facility]/Dr. Osei-Bonsu  [address] [place] or [address], [address],  [place] [phone] Phone number for both [facility] and [place] locations is the same.  [facility] and [facility] [address] [place] [phone]   [facility] [address] [place] or [address] [phone]   [facility] [address] [place] [phone], phone; [phone], fax Sees patients 1st and 3rd Saturday of every month.  Must not  qualify for public or private insurance (i.e. Medicaid, Medicare, [place] Health Choice, Veterans' Benefits)  Household income should be no more than 200% of the poverty level The clinic cannot treat you if you are pregnant or think you are pregnant  Sexually transmitted diseases are not treated at the clinic.    Dental Care: Organization         Address  Phone  Notes  [facility] Department of [facility] [place] [phone] Accepts children up to age 93 who are enrolled in [place] or [place]; pregnant women with a Medicaid card; and children who have applied for Medicaid or [place] Health Choice, but were declined, whose parents can pay a reduced fee at time of service.  [facility] Department of [facility]  [address], [place] [phone] Accepts children up to age 27 who are enrolled in [place] or [place]; pregnant women with a Medicaid card; and children who have applied for Medicaid or  Health Choice, but were declined, whose parents can pay a reduced fee at time of service.  [place]  [place] [phone] Patients are seen by appointment only. Walk-ins are not accepted. [place] will see patients 71 years of age and older. Monday - Tuesday (8am-5pm) Most Wednesdays (8:30-5pm) $30 per visit, cash only  [facility] Adult Dental Access PROGRAM  [address], [facility] [phone] Patients are seen by appointment only. Walk-ins are not accepted. [place] will see patients 37 years of age and older. One Wednesday Evening (Monthly: Volunteer Based).  $30 per visit, cash only  [place]  [phone] for adults; Children under age 72, call Graduate Pediatric Dentistry at [phone]. Children aged 45-14, please call [phone] to request a pediatric application.  Dental services are provided  in all areas of dental care including fillings, crowns and bridges, complete and partial dentures, implants, gum treatment, root canals, and extractions. Preventive care is also provided. Treatment is provided to both adults and children. Patients are selected via a lottery and there is often a waiting list.   [facility] [address] [place]  [phone] [email or web address]   [facility] [address] [place], [place] [phone], Ext. [phone] Second and Fourth Thursday of each month, opens at 6:30 AM; Clinic ends at 9 AM.  Patients are seen on a first-come first-served basis, and a limited number are seen during each clinic.   [facility]  [address] [place], [place] [phone]   Eligibility Requirements You must have lived in [place], [place], or [place] counties for at least the last three months.   You cannot be eligible for state or federal sponsored [employer], including [employer] [employer], [place], or [employer].   You generally cannot be eligible for healthcare insurance through your employer.    How to apply: Eligibility screenings are held every Tuesday and  Wednesday afternoon from 1:00 pm until 4:00 pm. You do not need an appointment for the interview!  [facility] [address] [place], [place] [phone]   [facility]  [phone]   [facility]  [phone]   [facility] Department  [phone]    Behavioral Health Resources in the Community: Intensive Outpatient Programs Organization         Address  Phone  Notes  [facility] [address], [place], [place] [phone]   [facility] Outpatient [address] [place], [place] [phone]   [facility] [address], [place], [place]  [phone]   [facility] [address],  [place], [place]  [phone] or [phone]   Substance Abuse Resources Organization         Address  Phone  Notes  [facility]  [phone]   [facility]  [phone]   [facility]  [phone]   Floydene Flock  [phone]   Residential & Outpatient Substance Abuse Program  [phone]   Psychological Services Organization         Address  Phone  Notes  [facility]  [phone]   [facility]  [phone]   [facility] [address], [place] [phone] or [phone]    Mobile Crisis Teams Organization         Address  Phone  Notes  [facility], Mobile Crisis Care Unit  [phone]   [facility]  [address] [place], [place] [phone]   Doristine Locks [address] [place] [phone]    Self-Help/Support Groups Organization         Address  Phone             Notes  [facility] [place] - variety of support groups  [phone]- I7437963 Call for more information  Narcotics Anonymous (NA), [facility] [address], [employer] [place]  2 meetings at this location   Statistician         Address  Phone  Notes  [facility] [address],    [place]  [phone]   [facility]  [address], [place] [address], [place], [place] [phone]   [facility] [address] [place], [place] [phone] Admissions: 8am-3pm M-F  [facility] [address],    [place], [place] [phone]   [facility] [address] [place], [place], [place] [phone]   [facility] [address],  [place], [place] [phone]   [facility] - Intensive Outpatient [address], [place], [place] [phone]   [facility] ([facility] [address] [place]  [place], [place] [phone] or  [phone]   [facility] ([facility]) [address], [place], [place] [phone] Accepts Medicaid  [facility] [address],  [place] [phone] Substance Abuse/Addiction Treatment   [facility] Organization         Address  Phone  Notes  [facility]  [phone]   Angie Fava, PhD [address] [place], [place]   [phone] or [phone]   [facility]   [address] [place], [place] [phone]   [facility] [address], [place], [place] [phone] Insurance/Medicaid/sponsorship through [employer]  and Families [address] [place], [place] [phone] [place], [place] [phone]    Dr. Adele Schilder  [phone]   [facility] Dept. 1) [address] [place] 2) [place] 3)  [place] [address], [place] [phone]  [phone]   [place] [phone] or [phone] (After Hours)

## 2013-09-17 NOTE — ED Provider Notes (Signed)
Medical screening examination/treatment/procedure(s) were performed by non-physician practitioner and as supervising physician I was immediately available for consultation/collaboration.   EKG Interpretation   Date/Time:  Thursday September 11 2013 14:37:34 EST Ventricular Rate:  55 PR Interval:  126 QRS Duration: 80 QT Interval:  418 QTC Calculation: 399 R Axis:   71 Text Interpretation:  Sinus bradycardia Low voltage QRS Borderline ECG No  significant change since last tracing Confirmed by POLLINA  MD,  CHRISTOPHER [phone]) on 09/11/2013 5:49:00 PM        Orpah Greek, MD 09/17/13 1527

## 2014-10-10 ENCOUNTER — Encounter (HOSPITAL_COMMUNITY): Payer: Self-pay | Admitting: Nurse Practitioner

## 2014-10-10 ENCOUNTER — Emergency Department (HOSPITAL_COMMUNITY): Payer: Self-pay

## 2014-10-10 ENCOUNTER — Emergency Department (HOSPITAL_COMMUNITY)
Admission: EM | Admit: 2014-10-10 | Discharge: 2014-10-10 | Disposition: A | Discharge status: Home / Self Care | Payer: Self-pay | Attending: Emergency Medicine | Admitting: Emergency Medicine

## 2014-10-10 DIAGNOSIS — Y990 Civilian activity done for income or pay: Secondary | ICD-10-CM | POA: Insufficient documentation

## 2014-10-10 DIAGNOSIS — Y9289 Other specified places as the place of occurrence of the external cause: Secondary | ICD-10-CM | POA: Insufficient documentation

## 2014-10-10 DIAGNOSIS — Y9389 Activity, other specified: Secondary | ICD-10-CM | POA: Insufficient documentation

## 2014-10-10 DIAGNOSIS — M25561 Pain in right knee: Secondary | ICD-10-CM

## 2014-10-10 DIAGNOSIS — Z87442 Personal history of urinary calculi: Secondary | ICD-10-CM | POA: Insufficient documentation

## 2014-10-10 DIAGNOSIS — Z720 Tobacco use: Secondary | ICD-10-CM | POA: Insufficient documentation

## 2014-10-10 DIAGNOSIS — S8991XA Unspecified injury of right lower leg, initial encounter: Secondary | ICD-10-CM | POA: Insufficient documentation

## 2014-10-10 MED ORDER — MELOXICAM 7.5 MG PO TABS: 7.5000 mg | ORAL_TABLET | Freq: Every day | ORAL | Status: DC

## 2014-10-10 NOTE — ED Notes (Signed)
Declined W/C at D/C and was escorted to lobby by RN. 

## 2014-10-10 NOTE — Discharge Instructions (Signed)
Read the information below.  Use the prescribed medication as directed.  Please discuss all new medications with your pharmacist.  You may return to the Emergency Department at any time for worsening condition or any new symptoms that concern you.    If you develop uncontrolled pain, weakness or numbness of the extremity, severe discoloration of the skin, or you are unable to walk or move your knee, return to the ER for a recheck.       Knee Pain The knee is the complex joint between your thigh and your lower leg. It is made up of bones, tendons, ligaments, and cartilage. The bones that make up the knee are:  The femur in the thigh.  The tibia and fibula in the lower leg.  The patella or kneecap riding in the groove on the lower femur. CAUSES  Knee pain is a common complaint with many causes. A few of these causes are:  Injury, such as:  A ruptured ligament or tendon injury.  Torn cartilage.  Medical conditions, such as:  Gout  Arthritis  Infections  Overuse, over training, or overdoing a physical activity. Knee pain can be minor or severe. Knee pain can accompany debilitating injury. Minor knee problems often respond well to self-care measures or get well on their own. More serious injuries may need medical intervention or even surgery. SYMPTOMS The knee is complex. Symptoms of knee problems can vary widely. Some of the problems are:  Pain with movement and weight bearing.  Swelling and tenderness.  Buckling of the knee.  Inability to straighten or extend your knee.  Your knee locks and you cannot straighten it.  Warmth and redness with pain and fever.  Deformity or dislocation of the kneecap. DIAGNOSIS  Determining what is wrong may be very straight forward such as when there is an injury. It can also be challenging because of the complexity of the knee. Tests to make a diagnosis may include:  Your caregiver taking a history and doing a physical exam.  Routine  X-rays can be used to rule out other problems. X-rays will not reveal a cartilage tear. Some injuries of the knee can be diagnosed by:  Arthroscopy a surgical technique by which a small video camera is inserted through tiny incisions on the sides of the knee. This procedure is used to examine and repair internal knee joint problems. Tiny instruments can be used during arthroscopy to repair the torn knee cartilage (meniscus).  Arthrography is a radiology technique. A contrast liquid is directly injected into the knee joint. Internal structures of the knee joint then become visible on X-ray film.  An MRI scan is a non X-ray radiology procedure in which magnetic fields and a computer produce two- or three-dimensional images of the inside of the knee. Cartilage tears are often visible using an MRI scanner. MRI scans have largely replaced arthrography in diagnosing cartilage tears of the knee.  Blood work.  Examination of the fluid that helps to lubricate the knee joint (synovial fluid). This is done by taking a sample out using a needle and a syringe. TREATMENT The treatment of knee problems depends on the cause. Some of these treatments are:  Depending on the injury, proper casting, splinting, surgery, or physical therapy care will be needed.  Give yourself adequate recovery time. Do not overuse your joints. If you begin to get sore during workout routines, back off. Slow down or do fewer repetitions.  For repetitive activities such as cycling or running, maintain  your strength and nutrition.  Alternate muscle groups. For example, if you are a weight lifter, work the upper body on one day and the lower body the next.  Either tight or weak muscles do not give the proper support for your knee. Tight or weak muscles do not absorb the stress placed on the knee joint. Keep the muscles surrounding the knee strong.  Take care of mechanical problems.  If you have flat feet, orthotics or special shoes  may help. See your caregiver if you need help.  Arch supports, sometimes with wedges on the inner or outer aspect of the heel, can help. These can shift pressure away from the side of the knee most bothered by osteoarthritis.  A brace called an "unloader" brace also may be used to help ease the pressure on the most arthritic side of the knee.  If your caregiver has prescribed crutches, braces, wraps or ice, use as directed. The acronym for this is PRICE. This means protection, rest, ice, compression, and elevation.  Nonsteroidal anti-inflammatory drugs (NSAIDs), can help relieve pain. But if taken immediately after an injury, they may actually increase swelling. Take NSAIDs with food in your stomach. Stop them if you develop stomach problems. Do not take these if you have a history of ulcers, stomach pain, or bleeding from the bowel. Do not take without your caregiver's approval if you have problems with fluid retention, heart failure, or kidney problems.  For ongoing knee problems, physical therapy may be helpful.  Glucosamine and chondroitin are over-the-counter dietary supplements. Both may help relieve the pain of osteoarthritis in the knee. These medicines are different from the usual anti-inflammatory drugs. Glucosamine may decrease the rate of cartilage destruction.  Injections of a corticosteroid drug into your knee joint may help reduce the symptoms of an arthritis flare-up. They may provide pain relief that lasts a few months. You may have to wait a few months between injections. The injections do have a small increased risk of infection, water retention, and elevated blood sugar levels.  Hyaluronic acid injected into damaged joints may ease pain and provide lubrication. These injections may work by reducing inflammation. A series of shots may give relief for as long as 6 months.  Topical painkillers. Applying certain ointments to your skin may help relieve the pain and stiffness of  osteoarthritis. Ask your pharmacist for suggestions. Many over the-counter products are approved for temporary relief of arthritis pain.  In some countries, doctors often prescribe topical NSAIDs for relief of chronic conditions such as arthritis and tendinitis. A review of treatment with NSAID creams found that they worked as well as oral medications but without the serious side effects. PREVENTION  Maintain a healthy weight. Extra pounds put more strain on your joints.  Get strong, stay limber. Weak muscles are a common cause of knee injuries. Stretching is important. Include flexibility exercises in your workouts.  Be smart about exercise. If you have osteoarthritis, chronic knee pain or recurring injuries, you may need to change the way you exercise. This does not mean you have to stop being active. If your knees ache after jogging or playing basketball, consider switching to swimming, water aerobics, or other low-impact activities, at least for a few days a week. Sometimes limiting high-impact activities will provide relief.  Make sure your shoes fit well. Choose footwear that is right for your sport.  Protect your knees. Use the proper gear for knee-sensitive activities. Use kneepads when playing volleyball or laying carpet. Buckle your  seat belt every time you drive. Most shattered kneecaps occur in car accidents.  Rest when you are tired. SEEK MEDICAL CARE IF:  You have knee pain that is continual and does not seem to be getting better.  SEEK IMMEDIATE MEDICAL CARE IF:  Your knee joint feels hot to the touch and you have a high fever. MAKE SURE YOU:   Understand these instructions.  Will watch your condition.  Will get help right away if you are not doing well or get worse. Document Released: 04/23/2007 Document Revised: 09/18/2011 Document Reviewed: 04/23/2007 [facility] Patient Information 2015 [place], [place]. This information is not intended to replace advice given to you by  your health care provider. Make sure you discuss any questions you have with your health care provider.  Knee Bracing Knee braces are supports to help stabilize and protect an injured or painful knee. They come in many different styles. They should support and protect the knee without increasing the chance of other injuries to yourself or others. It is important not to have a false sense of security when using a brace. Knee braces that help you to keep using your knee:  Do not restore normal knee stability under high stress forces.  May decrease some aspects of athletic performance. Some of the different types of knee braces are:  Prophylactic knee braces are designed to prevent or reduce the severity of knee injuries during sports that make injury to the knee more likely.  Rehabilitative knee braces are designed to allow protected motion of:  Injured knees.  Knees that have been treated with or without surgery. There is no evidence that the use of a supportive knee brace protects the graft following a successful anterior cruciate ligament (ACL) reconstruction. However, braces are sometimes used to:   Protect injured ligaments.  Control knee movement during the initial healing period. They may be used as part of the treatment program for the various injured ligaments or cartilage of the knee including the:  Anterior cruciate ligament.  Medial collateral ligament.  Medial or lateral cartilage (meniscus).  Posterior cruciate ligament.  Lateral collateral ligament. Rehabilitative knee braces are most commonly used:  During crutch-assisted walking right after injury.  During crutch-assisted walking right after surgery to repair the cartilage and/or cruciate ligament injury.  For a short period of time, 2-8 weeks, after the injury or surgery. The value of a rehabilitative brace as opposed to a cast or splint includes the:  Ability to adjust the brace for swelling.  Ability to  remove the brace for examinations, icing, or showering.  Ability to allow for movement in a controlled range of motion. Functional knee braces give support to knees that have already been injured. They are designed to provide stability for the injured knee and provide protection after repair. Functional knee braces may not affect performance much. Lower extremity muscle strengthening, flexibility, and improvement in technique are more important than bracing in treating ligamentous knee injuries. Functional braces are not a substitute for rehabilitation or surgical procedures. Unloader/off-loader braces are designed to provide pain relief in arthritic knees. Patients with wear and tear arthritis from growing old or from an old cartilage injury (osteoarthritis) of the knee, and bowlegged (varus) or knock-knee (valgus) deformities, often develop increased pain in the arthritic side due to increased loading. Unloader/off-loader braces are made to reduce uneven loading in such knees. There is reduction in bowing out movement in bowlegged knees when the correct unloader brace is used. Patients with advanced osteoarthritis or  severe varus or valgus alignment problems would not likely benefit from bracing. Patellofemoral braces help the kneecap to move smoothly and well centered over the end of the femur in the knee.  Most people who wear knee braces feel that they help. However, there is a lack of scientific evidence that knee braces are helpful at the level needed for athletic participation to prevent injury. In spite of this, athletes report an increase in knee stability, pain relief, performance improvement, and confidence during athletics when using a brace.  Different knee problems require different knee braces:  Your caregiver may suggest one kind of knee brace after knee surgery.  A caregiver may choose another kind of knee brace for support instead of surgery for some types of torn ligaments.  You may  also need one for pain in the front of your knee that is not getting better with strengthening and flexibility exercises. Get your caregiver's advice if you want to try a knee brace. The caregiver will advise you on where to get them and provide a prescription when it is needed to fashion and/or fit the brace. Knee braces are the least important part of preventing knee injuries or getting better following injury. Stretching, strengthening and technique improvement are far more important in caring for and preventing knee injuries. When strengthening your knee, increase your activities a little at a time so as not to develop injuries from overuse. Work out an exercise plan with your caregiver and/or physical therapist to get the best program for you. Do not let a knee brace become a crutch. Always remember, there are no braces which support the knee as well as your original ligaments and cartilage you were born with. Conditioning, proper warm-up, and stretching remain the most important parts of keeping your knees healthy. HOW TO USE A KNEE BRACE  During sports, knee braces should be used as directed by your caregiver.  Make sure that the hinges are where the knee bends.  Straps, tapes, or hook-and-loop tapes should be fastened around your leg as instructed.  You should check the placement of the brace during activities to make sure that it has not moved. Poorly positioned braces can hurt rather than help you.  To work well, a knee brace should be worn during all activities that put you at risk of knee injury.  Warm up properly before beginning athletic activities. HOME CARE INSTRUCTIONS  Knee braces often get damaged during normal use. Replace worn-out braces for maximum benefit.  Clean regularly with soap and water.  Inspect your brace often for wear and tear.  Cover exposed metal to protect others from injury.  Durable materials may cost more, but last longer. SEEK IMMEDIATE MEDICAL CARE  IF:   Your knee seems to be getting worse rather than better.  You have increasing pain or swelling in the knee.  You have problems caused by the knee brace.  You have increased swelling or inflammation (redness or soreness) in your knee.  Your knee becomes warm and more painful and you develop an unexplained temperature over 101F (38.3C). MAKE SURE YOU:   Understand these instructions.  Will watch your condition.  Will get help right away if you are not doing well or get worse. See your caregiver, physical therapist, or orthopedic surgeon for additional information. Document Released: 09/16/2003 Document Revised: 11/10/2013 Document Reviewed: 12/23/2008 [facility] Patient Information 2015 [place], [place]. This information is not intended to replace advice given to you by your health care provider. Make sure  you discuss any questions you have with your health care provider.

## 2014-10-10 NOTE — ED Notes (Signed)
Pt R knee pain increasingly worse over past 2 weeks. She fell on it 1 month ago while roller skating. She has tried aleve and ice with minimal relief. She reports the pain is worse after she is on her feet all day at work.

## 2014-10-10 NOTE — ED Provider Notes (Signed)
CSN: [id number]     Arrival date & time 10/10/14  1200 History  This chart is scribed for non-physician practitioner, Clayton Bibles, PA-C, working with Debby Freiberg, MD by Chester Holstein, ED Scribe.  This patient was seen in room TR10C/TR10C and the patient's care was started 12:47 PM.     Chief Complaint  Patient presents with  . Knee Pain     Patient is a 49 y.o. female presenting with knee pain. The history is provided by the patient. No language interpreter was used.  Knee Pain Associated symptoms: no back pain and no fever    HPI Comments: Katie Sherman is a 49 y.o. female who presents to the Emergency Department complaining of right knee pain to medial aspect with onset 2 weeks ago. Pt states she fell at a skating rink, falling directly onto her knee a month ago and thinks pain is related. Small bruise and healing wounds noted. Pt notes associated intermittent swelling with onset 2 days ago. Pt states she is on her feet at work for prolonged periods of time. Pt has taken Aleve and tried icing with mild relief.  Pt with h/o of right tibia fracture. Pt is not on blood thinners. Pt has not been sick recently or treated for infection. Pt denies back pain, hip pain, leg swelling, fever, chills, weakness, and numbness, other joint pain.  Past Medical History  Diagnosis Date  . Kidney stone    Past Surgical History  Procedure Laterality Date  . Tubal ligation     History reviewed. No pertinent family history. History  Substance Use Topics  . Smoking status: Current Every Day Smoker    Types: Cigarettes  . Smokeless tobacco: Not on file  . Alcohol Use: No   OB History    No data available     Review of Systems  Constitutional: Negative for fever and chills.  Cardiovascular: Negative for leg swelling.  Musculoskeletal: Positive for joint swelling and arthralgias. Negative for back pain.  Skin: Negative for wound.  Allergic/Immunologic: Negative for immunocompromised state.   Neurological: Negative for weakness and numbness.  Hematological: Does not bruise/bleed easily.  Psychiatric/Behavioral: Negative for self-injury.      Allergies  Review of patient's allergies indicates no known allergies.  Home Medications   Prior to Admission medications   Not on File   BP 110/87 mmHg  Pulse 79  Temp(Src) 97.6 F (36.4 C) (Oral)  Resp 18  Ht 5\' 7"  (1.702 m)  Wt 195 lb (88.451 kg)  BMI 30.53 kg/m2  SpO2 97% Physical Exam  Constitutional: She appears well-developed and well-nourished. No distress.  HENT:  Head: Normocephalic and atraumatic.  Neck: Neck supple.  Cardiovascular: Intact distal pulses.   Pulmonary/Chest: Effort normal.  Musculoskeletal: Normal range of motion. She exhibits no edema.  Right knee: tenderness over medial aspect; pain with valgus pressure  Neurological: She is alert.  Skin: She is not diaphoretic.  No erythema, edema, warmth, discharge.  Nursing note and vitals reviewed.   ED Course  Procedures (including critical care time) DIAGNOSTIC STUDIES: Oxygen Saturation is 97% on room air, normal by my interpretation.    COORDINATION OF CARE: 12:56 PM Discussed treatment plan with patient at beside, the patient agrees with the plan and has no further questions at this time.   Labs Review Labs Reviewed - No data to display  Imaging Review Dg Knee Complete 4 Views Right  10/10/2014   CLINICAL DATA:  Fall while skating 1 month ago with persistent  knee pain, initial encounter  EXAM: RIGHT KNEE - COMPLETE 4+ VIEW  COMPARISON:  None.  FINDINGS: No acute fracture or dislocation is noted. Mild spurring from the patella is seen. No gross soft tissue abnormality is noted.  IMPRESSION: No acute abnormality noted.   Electronically Signed   By: Inez Catalina M.D.   On: 10/10/2014 13:30     EKG Interpretation None      MDM   Final diagnoses:  Right knee pain   Afebrile, nontoxic patient with injury to right knee 1 month ago, pain  and intermittent swelling 2 weeks ago.  Concern for medial collateral ligament vs meniscal injury.  Xray significant for mild spurring of patella only.  Neurovascularly intact.   D/C home with knee sleeve, (declined crutches), mobic, orthopedic follow up.  Discussed result, findings, treatment, and follow up  with patient.  Pt given return precautions.  Pt verbalizes understanding and agrees with plan.      I doubt any other EMC precluding discharge at this time including, but not necessarily limited to the following: septic joint   I personally performed the services described in this documentation, which was scribed in my presence. The recorded information has been reviewed and is accurate.     Clayton Bibles, PA-C 10/10/14 1400  Debby Freiberg, MD 10/13/14 [phone]

## 2016-07-10 DIAGNOSIS — Z9889 Other specified postprocedural states: Secondary | ICD-10-CM

## 2016-07-10 HISTORY — DX: Other specified postprocedural states: Z98.890

## 2016-10-04 ENCOUNTER — Encounter (HOSPITAL_COMMUNITY): Payer: Self-pay

## 2016-10-04 ENCOUNTER — Emergency Department (HOSPITAL_COMMUNITY)
Admission: EM | Admit: 2016-10-04 | Discharge: 2016-10-05 | Disposition: A | Discharge status: Home / Self Care | Payer: Self-pay | Attending: Emergency Medicine | Admitting: Emergency Medicine

## 2016-10-04 ENCOUNTER — Emergency Department (HOSPITAL_COMMUNITY): Payer: Self-pay

## 2016-10-04 DIAGNOSIS — Z3201 Encounter for pregnancy test, result positive: Secondary | ICD-10-CM

## 2016-10-04 DIAGNOSIS — M25512 Pain in left shoulder: Secondary | ICD-10-CM | POA: Insufficient documentation

## 2016-10-04 DIAGNOSIS — R55 Syncope and collapse: Secondary | ICD-10-CM | POA: Insufficient documentation

## 2016-10-04 DIAGNOSIS — Z79899 Other long term (current) drug therapy: Secondary | ICD-10-CM | POA: Insufficient documentation

## 2016-10-04 DIAGNOSIS — F17210 Nicotine dependence, cigarettes, uncomplicated: Secondary | ICD-10-CM | POA: Insufficient documentation

## 2016-10-04 DIAGNOSIS — Z780 Asymptomatic menopausal state: Secondary | ICD-10-CM

## 2016-10-04 LAB — URINALYSIS, ROUTINE W REFLEX MICROSCOPIC
Bilirubin Urine: NEGATIVE
GLUCOSE, UA: NEGATIVE mg/dL
Hgb urine dipstick: NEGATIVE
Ketones, ur: NEGATIVE mg/dL
LEUKOCYTES UA: NEGATIVE
Nitrite: NEGATIVE
Protein, ur: NEGATIVE mg/dL
SPECIFIC GRAVITY, URINE: 1.026 (ref 1.005–1.030)
pH: 5 (ref 5.0–8.0)

## 2016-10-04 LAB — BASIC METABOLIC PANEL
ANION GAP: 10 (ref 5–15)
BUN: 10 mg/dL (ref 6–20)
CHLORIDE: 107 mmol/L (ref 101–111)
CO2: 25 mmol/L (ref 22–32)
CREATININE: 0.87 mg/dL (ref 0.44–1.00)
Calcium: 9.2 mg/dL (ref 8.9–10.3)
GFR calc non Af Amer: >60 mL/min (ref >60)
Glucose, Bld: 126 mg/dL — ABNORMAL HIGH (ref 65–99)
POTASSIUM: 4.1 mmol/L (ref 3.5–5.1)
Sodium: 142 mmol/L (ref 135–145)

## 2016-10-04 LAB — CBC
HCT: 42.6 % (ref 36.0–46.0)
HEMOGLOBIN: 14.2 g/dL (ref 12.0–15.0)
MCH: 31.5 pg (ref 26.0–34.0)
MCHC: 33.3 g/dL (ref 30.0–36.0)
MCV: 94.5 fL (ref 78.0–100.0)
Platelets: 300 10*3/uL (ref 150–400)
RBC: 4.51 MIL/uL (ref 3.87–5.11)
RDW: 12.3 % (ref 11.5–15.5)
WBC: 7.1 10*3/uL (ref 4.0–10.5)

## 2016-10-04 LAB — I-STAT BETA HCG BLOOD, ED (MC, WL, AP ONLY): I-stat hCG, quantitative: 8.6 m[IU]/mL — ABNORMAL HIGH (ref <5)

## 2016-10-04 NOTE — ED Notes (Signed)
Pt returned from [place]; requesting to walk around. Pt ambulatory with steady gait.

## 2016-10-04 NOTE — ED Triage Notes (Signed)
Pt reports recurrent episodes of near syncope in which she describes feeling lightheaded, cold sweats, nausea. No LOC. She states this has happened before and was told to follow up with cardiology to wear a heart monitor but she never did. Pt A&Ox4.

## 2016-10-04 NOTE — ED Provider Notes (Signed)
[facility] DEPT Provider Note   CSN: [id number] Arrival date & time: 10/04/16  1825     History   Chief Complaint Chief Complaint  Patient presents with  . Near Syncope    HPI Katie Sherman is a 51 y.o. female.  Is a 51 year old female with multiple complaints today.  Left shoulder pain for approximately one month.  Is limited range of motion left side, pain, just under the ribs again for approximately one month.  She's also had 4 episodes of near syncope.  This month, none today, but she did notice that her feet were swollen and painful today after work.  This has gradually gotten better as she is elevated.  Her feet.  She denies any chest pain or shortness of breath, but does report intermittent episodes of left arm numbness and tingling. She did have several episodes of near syncope in 2015, for which she was evaluated in the emergency department and was found that she was bradycardic and they recommended a cardiac evaluation to include, a Holter monitor, but she never followed up and then she had a period of years when she had no further episodes of near syncope until one month.      Past Medical History:  Diagnosis Date  . Kidney stone     There are no active problems to display for this patient.   Past Surgical History:  Procedure Laterality Date  . TUBAL LIGATION      OB History    No data available       Home Medications    Prior to Admission medications   Medication Sig Start Date End Date Taking? Authorizing Provider  ibuprofen (ADVIL,MOTRIN) 200 MG tablet Take 400 mg by mouth every 6 (six) hours as needed for moderate pain.   Yes Historical Provider, MD  naproxen sodium (ANAPROX) 220 MG tablet Take 440 mg by mouth 2 (two) times daily as needed (pain).   Yes Historical Provider, MD    Family History No family history on file.  Social History Social History  Substance Use Topics  . Smoking status: Current Every Day Smoker    Types: Cigarettes    . Smokeless tobacco: Never Used  . Alcohol use No     Allergies   Patient has no known allergies.   Review of Systems Review of Systems  Respiratory: Negative for cough and shortness of breath.   Cardiovascular: Negative for chest pain.  Gastrointestinal: Negative for abdominal pain.  Musculoskeletal: Positive for arthralgias. Negative for joint swelling.  Skin: Negative for rash and wound.  Neurological: Positive for numbness.  All other systems reviewed and are negative.    Physical Exam Updated Vital Signs BP 110/82   Pulse 64   Temp 98.2 F (36.8 C) (Oral)   Resp 16   SpO2 96%   Physical Exam  Constitutional: She is oriented to person, place, and time. She appears well-developed and well-nourished. No distress.  HENT:  Head: Normocephalic.  Eyes: Pupils are equal, round, and reactive to light.  Neck: Normal range of motion.  Cardiovascular: Normal rate and regular rhythm.   Pulmonary/Chest: Effort normal.  Abdominal: Soft.  Musculoskeletal: She exhibits edema.  Minimal edema bilateral feet-socks relieving is slight indentation at the ankle  Neurological: She is alert and oriented to person, place, and time.  Skin: Skin is dry.  Psychiatric: She has a normal mood and affect.  Nursing note and vitals reviewed.    ED Treatments / Results  Labs (all labs ordered  are listed, but only abnormal results are displayed) Labs Reviewed  BASIC METABOLIC PANEL - Abnormal; Notable for the following:       Result Value   Glucose, Bld 126 (*)    All other components within normal limits  URINALYSIS, ROUTINE W REFLEX MICROSCOPIC - Abnormal; Notable for the following:    APPearance HAZY (*)    All other components within normal limits  I-STAT BETA HCG BLOOD, ED ([facility], [facility], [facility] ONLY) - Abnormal; Notable for the following:    I-stat hCG, quantitative 8.6 (*)    All other components within normal limits  CBC  CBG MONITORING, ED    EKG  EKG  Interpretation  Date/Time:  Wednesday October 04 2016 18:38:10 EDT Ventricular Rate:  68 PR Interval:  134 QRS Duration: 78 QT Interval:  360 QTC Calculation: 382 R Axis:   84 Text Interpretation:  Normal sinus rhythm with sinus arrhythmia Normal ECG No STEMI Confirmed by Sherry Ruffing MD, CHRISTOPHER [phone]) on 10/04/2016 9:06:39 PM       Radiology Dg Chest 2 View  Result Date: 10/04/2016 CLINICAL DATA:  Acute onset of near-syncope. Lightheadedness, cold sweats and nausea. Initial encounter. EXAM: CHEST  2 VIEW COMPARISON:  None. FINDINGS: The lungs are well-aerated and clear. There is no evidence of focal opacification, pleural effusion or pneumothorax. The heart is normal in size; the mediastinal contour is within normal limits. No acute osseous abnormalities are seen. IMPRESSION: No acute cardiopulmonary process seen. Electronically Signed   By: Garald Balding M.D.   On: 10/04/2016 21:55   US Transvaginal Non-ob  Addendum Date: 10/04/2016   ADDENDUM REPORT: 10/04/2016 22:42 ADDENDUM: Trace free fluid within the endometrial cavity. Electronically Signed   By: Elon Alas M.D.   On: 10/04/2016 22:42   Result Date: 10/04/2016 CLINICAL DATA:  LEFT lower quadrant pain. Positive pregnancy test, postmenopausal. EXAM: TRANSABDOMINAL AND TRANSVAGINAL ULTRASOUND OF PELVIS TECHNIQUE: Both transabdominal and transvaginal ultrasound examinations of the pelvis were performed. Transabdominal technique was performed for global imaging of the pelvis including uterus, ovaries, adnexal regions, and pelvic cul-de-sac. It was necessary to proceed with endovaginal exam following the transabdominal exam to visualize the endometrium and adnexa. COMPARISON:  None FINDINGS: Uterus Measurements: 6 x 3 x 4.3 cm. No fibroids or other mass visualized. Endometrium Thickness: 6 mm.  No focal abnormality visualized. Right ovary Measurements: 1.6 x 1.1.6 cm. Normal appearance/no adnexal mass. Left ovary Measurements: 1.7 x  0.8 x 1.6 cm. Normal appearance/no adnexal mass. Other findings No abnormal free fluid. IMPRESSION: Negative pelvic ultrasound. Electronically Signed: By: Elon Alas M.D. On: 10/04/2016 22:40   US Pelvis Complete  Addendum Date: 10/04/2016   ADDENDUM REPORT: 10/04/2016 22:42 ADDENDUM: Trace free fluid within the endometrial cavity. Electronically Signed   By: Elon Alas M.D.   On: 10/04/2016 22:42   Result Date: 10/04/2016 CLINICAL DATA:  LEFT lower quadrant pain. Positive pregnancy test, postmenopausal. EXAM: TRANSABDOMINAL AND TRANSVAGINAL ULTRASOUND OF PELVIS TECHNIQUE: Both transabdominal and transvaginal ultrasound examinations of the pelvis were performed. Transabdominal technique was performed for global imaging of the pelvis including uterus, ovaries, adnexal regions, and pelvic cul-de-sac. It was necessary to proceed with endovaginal exam following the transabdominal exam to visualize the endometrium and adnexa. COMPARISON:  None FINDINGS: Uterus Measurements: 6 x 3 x 4.3 cm. No fibroids or other mass visualized. Endometrium Thickness: 6 mm.  No focal abnormality visualized. Right ovary Measurements: 1.6 x 1.1.6 cm. Normal appearance/no adnexal mass. Left ovary Measurements: 1.7 x 0.8 x 1.6  cm. Normal appearance/no adnexal mass. Other findings No abnormal free fluid. IMPRESSION: Negative pelvic ultrasound. Electronically Signed: By: Elon Alas M.D. On: 10/04/2016 22:40   Dg Shoulder Left  Addendum Date: 10/04/2016   ADDENDUM REPORT: 10/04/2016 22:01 ADDENDUM: If the patient's symptoms persist, MRI could be considered to assess for underlying internal derangement. Electronically Signed   By: Garald Balding M.D.   On: 10/04/2016 22:01   Result Date: 10/04/2016 CLINICAL DATA:  Chronic left shoulder pain for 2 months. Initial encounter. EXAM: LEFT SHOULDER - 2+ VIEW COMPARISON:  None. FINDINGS: There is no evidence of fracture or dislocation. The left humeral head is seated  within the glenoid fossa. The acromioclavicular joint is unremarkable in appearance. No significant soft tissue abnormalities are seen. The visualized portions of the left lung are clear. IMPRESSION: No evidence of fracture or dislocation. Electronically Signed: By: Garald Balding M.D. On: 10/04/2016 21:56    Procedures Procedures (including critical care time)  Medications Ordered in ED Medications - No data to display   Initial Impression / Assessment and Plan / ED Course  I have reviewed the triage vital signs and the nursing notes.  Pertinent labs & imaging results that were available during my care of the patient were reviewed by me and considered in my medical decision making (see chart for details).   Patient questioned about positive pregnancy test.  She states that she has not had intercourse for the past 7 months, and she's been menopausal without a menstrual cycle since 2014.   Look ultrasound reveals no pathology to have caused abnormal hCG.  Rest your evaluation has been benign.  She has been referred to orthopedic surgery for potential rotator cuff injury and cardiology for follow-up with a Holter monitor or loop monitor for syncopal or near syncopal events   Final Clinical Impressions(s) / ED Diagnoses   Final diagnoses:  Near syncope  Left shoulder pain, unspecified chronicity    New Prescriptions New Prescriptions   No medications on file     Junius Creamer, NP 10/05/16 [place], MD 10/05/16 1108

## 2016-10-05 LAB — CBG MONITORING, ED: GLUCOSE-CAPILLARY: 80 mg/dL (ref 65–99)

## 2016-10-05 NOTE — Discharge Instructions (Signed)
Tonight your evaluated for near syncopal episodes over the past month, as well as swelling in your feet and pain in your shoulder. I'm not finding any specific cause for any of your symptoms.  You most likely have a rotator cuff injury and had been referred to orthopedic surgery for further evaluation.  He been given range of motion exercises that you can start doing at home. You've also been given a referral to cardiology.  He will need to have a cardiac event monitor placed so that they can evaluate.  Your heart rate during your near syncopal episodes  Abdominal ultrasound is normal, not revealing any abnormal pathology to cause your hormone level to be elevated, which is reassuring

## 2016-10-09 ENCOUNTER — Encounter: Payer: Self-pay | Admitting: Physician Assistant

## 2016-10-09 ENCOUNTER — Ambulatory Visit: Payer: Self-pay | Attending: Internal Medicine | Admitting: Physician Assistant

## 2016-10-09 VITALS — BP 121/84 | HR 70 | Temp 98.4°F | Resp 16 | Wt 178.4 lb

## 2016-10-09 DIAGNOSIS — IMO0002 Reserved for concepts with insufficient information to code with codable children: Secondary | ICD-10-CM

## 2016-10-09 DIAGNOSIS — M25512 Pain in left shoulder: Secondary | ICD-10-CM | POA: Insufficient documentation

## 2016-10-09 DIAGNOSIS — R799 Abnormal finding of blood chemistry, unspecified: Secondary | ICD-10-CM

## 2016-10-09 DIAGNOSIS — R7989 Other specified abnormal findings of blood chemistry: Secondary | ICD-10-CM | POA: Insufficient documentation

## 2016-10-09 DIAGNOSIS — F17210 Nicotine dependence, cigarettes, uncomplicated: Secondary | ICD-10-CM | POA: Insufficient documentation

## 2016-10-09 DIAGNOSIS — Z8541 Personal history of malignant neoplasm of cervix uteri: Secondary | ICD-10-CM | POA: Insufficient documentation

## 2016-10-09 DIAGNOSIS — Z8249 Family history of ischemic heart disease and other diseases of the circulatory system: Secondary | ICD-10-CM | POA: Insufficient documentation

## 2016-10-09 DIAGNOSIS — Z9851 Tubal ligation status: Secondary | ICD-10-CM | POA: Insufficient documentation

## 2016-10-09 DIAGNOSIS — R55 Syncope and collapse: Secondary | ICD-10-CM | POA: Insufficient documentation

## 2016-10-09 DIAGNOSIS — R001 Bradycardia, unspecified: Secondary | ICD-10-CM

## 2016-10-09 DIAGNOSIS — Z87442 Personal history of urinary calculi: Secondary | ICD-10-CM | POA: Insufficient documentation

## 2016-10-09 NOTE — Progress Notes (Signed)
Alysson Geist  UJW:[id number]  FAO:[id number]  DOB - 11/14/1965  Chief Complaint  Patient presents with  . Follow-up  . Shoulder Pain  . Near Syncope       Subjective:   Kiaria Quinnell is a 51 y.o. female here today for establishment of care. She has a PMHx of cervical cancer status post cone biopsy. She has not routinely been seen by primary care provider for several years. She presented to the emergency department on 10/04/2016 with multiple complaints:  1. Left shoulder pain-she doesn't recall any injury or fall. Constant anterior left shoulder pain without radiation. No weakness. She's not dropping objects. She cannot elevated higher than shoulder height. X-ray showed no evidence of fracture or dislocation.  2. Near syncope-she states that 2-3 years ago she had an episode where she was sitting down and suddenly felt lightheaded and sweaty. She got up to and await outside and extension is she was on her bottom. She doesn't recall actually losing consciousness. She had 3-4 episodes later on that same year. She went to the emergency department after the fourth or fifth episode is referred to cardiology but didn't have the insurance to follow through. She did well over the last couple years until one month ago where she has had 2-3 reoccurrences. Each time she sitting. No palpitations experienced. No chest pain. EKG in the emergency department within normal limits. CBC and CMP normal as well. Urinalysis normal.  3. Lastly incidentally in the emergency department and hCG returned positive at 8.6. She does not take hormone replacement. She has not had a menstrual cycle since 2014. She's not had sexual intercourse for 7-8 months. She is a smoker.  ROS: GEN: denies fever or chills, denies change in weight Skin: denies lesions or rashes HEENT: denies headache, earache, epistaxis, sore throat, or neck pain LUNGS: denies SHOB, dyspnea, PND, orthopnea CV: denies CP or palpitations ABD:  denies abd pain, N or V EXT: denies muscle spasms or swelling; no pain in lower ext, no weakness NEURO: denies numbness or tingling, denies sz, stroke or TIA  ALLERGIES: No Known Allergies  PAST MEDICAL HISTORY: Past Medical History:  Diagnosis Date  . Kidney stone     PAST SURGICAL HISTORY: Past Surgical History:  Procedure Laterality Date  . TUBAL LIGATION      MEDICATIONS AT HOME: Prior to Admission medications   Medication Sig Start Date End Date Taking? Authorizing Provider  ibuprofen (ADVIL,MOTRIN) 200 MG tablet Take 400 mg by mouth every 6 (six) hours as needed for moderate pain.   Yes Historical Provider, MD  naproxen sodium (ANAPROX) 220 MG tablet Take 440 mg by mouth 2 (two) times daily as needed (pain).    Historical Provider, MD   Fam hx-CAD in mom and dad Social-widow, 5 children, 10 grands and 2 on the way; smoker 1ppd  Objective:   Vitals:   10/09/16 1357  BP: 121/84  Pulse: 70  Resp: 16  Temp: 98.4 F (36.9 C)  TempSrc: Oral  SpO2: 97%  Weight: 178 lb 6.4 oz (80.9 kg)    Exam-benign General appearance : Awake, alert, not in any distress. Speech Clear. Not toxic looking HEENT: Atraumatic and Normocephalic, pupils equally reactive to light and accomodation Neck: supple, no JVD. No cervical lymphadenopathy.  Chest:Good air entry bilaterally, no added sounds  CVS: S1 S2 regular, no murmurs.  Abdomen: Bowel sounds present, Non tender and not distended with no guarding, rigidity or rebound. Extremities: B/L Lower Ext shows no edema, both  legs are warm to touch Neurology: Awake alert, and oriented X 3, CN II-XII intact, Non focal Skin:No Rash Wounds:N/A   Assessment & Plan  1. Left shoulder pain  -NSAIDS/ice  -Ortho referral for suspected rotator cuff injury 2. Pre syncope and hx bradycardia  -TSH   -CARDS referral for event recorder/echo etc 3. Smoker  -cessation discussed, she is not ready to QUIT 4. Abn HCG  -GYN referral  Financial  counselor Return in about 2 weeks (around 10/23/2016).  The patient was given clear instructions to go to ER or return to medical center if symptoms don't improve, worsen or new problems develop. The patient verbalized understanding. The patient was told to call to get lab results if they haven't heard anything in the next week.   Total time spent with patient was 28 min. Greater than 50 % of this visit was spent face to face counseling and coordinating care regarding risk factor modification, compliance importance and encouragement, education related to lab findings, smoking and referrals.  This note has been created with Surveyor, quantity. Any transcriptional errors are unintentional.    Zettie Pho, PA-C [employer] and [facility] [place], [place]   10/09/2016, 2:22 PM

## 2016-10-09 NOTE — Patient Instructions (Signed)
Steps to Quit Smoking Smoking tobacco can be bad for your health. It can also affect almost every organ in your body. Smoking puts you and people around you at risk for many serious long-lasting (chronic) diseases. Quitting smoking is hard, but it is one of the best things that you can do for your health. It is never too late to quit. What are the benefits of quitting smoking? When you quit smoking, you lower your risk for getting serious diseases and conditions. They can include:  Lung cancer or lung disease.  Heart disease.  Stroke.  Heart attack.  Not being able to have children (infertility).  Weak bones (osteoporosis) and broken bones (fractures). If you have coughing, wheezing, and shortness of breath, those symptoms may get better when you quit. You may also get sick less often. If you are pregnant, quitting smoking can help to lower your chances of having a baby of low birth weight. What can I do to help me quit smoking? Talk with your doctor about what can help you quit smoking. Some things you can do (strategies) include:  Quitting smoking totally, instead of slowly cutting back how much you smoke over a period of time.  Going to in-person counseling. You are more likely to quit if you go to many counseling sessions.  Using resources and support systems, such as:  Online chats with a counselor.  Phone quitlines.  Printed self-help materials.  Support groups or group counseling.  Text messaging programs.  Mobile phone apps or applications.  Taking medicines. Some of these medicines may have nicotine in them. If you are pregnant or breastfeeding, do not take any medicines to quit smoking unless your doctor says it is okay. Talk with your doctor about counseling or other things that can help you. Talk with your doctor about using more than one strategy at the same time, such as taking medicines while you are also going to in-person counseling. This can help make quitting  easier. What things can I do to make it easier to quit? Quitting smoking might feel very hard at first, but there is a lot that you can do to make it easier. Take these steps:  Talk to your family and friends. Ask them to support and encourage you.  Call phone quitlines, reach out to support groups, or work with a counselor.  Ask people who smoke to not smoke around you.  Avoid places that make you want (trigger) to smoke, such as:  Bars.  Parties.  Smoke-break areas at work.  Spend time with people who do not smoke.  Lower the stress in your life. Stress can make you want to smoke. Try these things to help your stress:  Getting regular exercise.  Deep-breathing exercises.  Yoga.  Meditating.  Doing a body scan. To do this, close your eyes, focus on one area of your body at a time from head to toe, and notice which parts of your body are tense. Try to relax the muscles in those areas.  Download or buy apps on your mobile phone or tablet that can help you stick to your quit plan. There are many free apps, such as QuitGuide from the CDC (Centers for Disease Control and Prevention). You can find more support from smokefree.gov and other websites. This information is not intended to replace advice given to you by your health care provider. Make sure you discuss any questions you have with your health care provider. Document Released: 04/22/2009 Document Revised: 02/22/2016 Document   Reviewed: 11/10/2014 Elsevier Interactive Patient Education  2017 [employer]  

## 2016-10-10 LAB — TSH: TSH: 1.98 u[IU]/mL (ref 0.450–4.500)

## 2016-10-16 ENCOUNTER — Ambulatory Visit: Payer: Self-pay

## 2016-10-24 ENCOUNTER — Telehealth: Payer: Self-pay | Admitting: General Practice

## 2016-10-24 NOTE — Telephone Encounter (Signed)
Per Dr Gala Romney, patient needs to come in for bhcg level prior to office appt due to inaccuracy of level drawn in ER. Called patient & discussed need to come in for recheck. Patient verbalized understanding & states she can come tomorrow at 2pm. Patient had no questions

## 2016-10-25 ENCOUNTER — Ambulatory Visit: Payer: Self-pay

## 2016-10-25 ENCOUNTER — Other Ambulatory Visit: Payer: Self-pay

## 2016-10-25 DIAGNOSIS — E349 Endocrine disorder, unspecified: Secondary | ICD-10-CM

## 2016-10-26 ENCOUNTER — Telehealth: Payer: Self-pay | Admitting: General Practice

## 2016-10-26 LAB — BETA HCG QUANT (REF LAB): HCG QUANT: 3 m[IU]/mL

## 2016-10-26 NOTE — Telephone Encounter (Signed)
Called patient & informed her of negative bhcg levels and that high levels were due to inaccuracy of the type of test that was ran in the ED. Discussed need for pap screening until at least age 49. Patient verbalized understanding to all & states she will remind them at her upcoming appt on the 4/30. Patient had no questions

## 2016-11-01 ENCOUNTER — Encounter (INDEPENDENT_AMBULATORY_CARE_PROVIDER_SITE_OTHER): Payer: Self-pay

## 2016-11-01 ENCOUNTER — Ambulatory Visit (INDEPENDENT_AMBULATORY_CARE_PROVIDER_SITE_OTHER): Payer: Self-pay | Admitting: Physician Assistant

## 2016-11-01 ENCOUNTER — Encounter: Payer: Self-pay | Admitting: Physician Assistant

## 2016-11-01 VITALS — BP 108/64 | HR 55 | Ht 67.0 in | Wt 178.8 lb

## 2016-11-01 DIAGNOSIS — Z720 Tobacco use: Secondary | ICD-10-CM

## 2016-11-01 DIAGNOSIS — R55 Syncope and collapse: Secondary | ICD-10-CM

## 2016-11-01 DIAGNOSIS — Z8249 Family history of ischemic heart disease and other diseases of the circulatory system: Secondary | ICD-10-CM

## 2016-11-01 NOTE — Patient Instructions (Addendum)
Medication Instructions:  Your physician recommends that you continue on your current medications as directed. Please refer to the Current Medication list given to you today.  Labwork: NONE ORDERED  Testing/Procedures: 1. Your physician has requested that you have a stress echocardiogram. For further information please visit [email or web address]. Please follow instruction sheet as given.  2. Your physician has recommended that you wear an event monitor. Event monitors are medical devices that record the heart's electrical activity. Doctors most often [place] these monitors to diagnose arrhythmias. Arrhythmias are problems with the speed or rhythm of the heartbeat. The monitor is a small, portable device. You can wear one while you do your normal daily activities. This is usually used to diagnose what is causing palpitations/syncope (passing out).  Follow-Up: DR. Radford Pax AS NEEDED AT THIS TIME PENDING TEST RESULTS  Any Other Special Instructions Will Be Listed Below (If Applicable).  If you need a refill on your cardiac medications before your next appointment, please call your pharmacy.

## 2016-11-01 NOTE — Progress Notes (Signed)
Cardiology Office Note:    Date:  11/01/2016   ID:  Katie Sherman, DOB Jun 28, 1966, MRN [id number]  PCP:  No PCP Per Patient  Cardiologist:  New - Dr. Fransico Him   Electrophysiologist:  n/a  Referring MD: Brayton Caves, PA-C   Chief Complaint  Patient presents with  . Near Syncope    History of Present Illness:    Katie Sherman is a 51 y.o. female with a hx of tobacco abuse who is being seen today for the evaluation of near syncope and bradycardia at the request of Brayton Caves, PA-C   Patient was seen in the ED some years ago with near syncope but never followed up with Cardiology as recommended.  She was seen by primary care recently and noted recurrent symptoms about 1 month ago.    She tells me that she had several episodes of near syncope in 2015.  All episodes have occurred while seated.  She denies spinning. She denies orthostatic intolerance.  She denies exertional symptoms.  She has assoc diaphoresis.  She denies any chest pain, dyspnea on exertion.  She denies orthopnea, PND, edema. She denies frank syncope.    PAD Screen 11/01/2016  Previous PAD dx? No  Previous surgical procedure? No  Pain with walking? No  Feet/toe relief with dangling? No  Painful, non-healing ulcers? No  Extremities discolored? No    Prior CV studies:   The following studies were reviewed today:  None   Past Medical History:  Diagnosis Date  . Cervical cancer ([place])   . Kidney stone     Past Surgical History:  Procedure Laterality Date  . TUBAL LIGATION      Current Medications: Current Meds  Medication Sig  . ibuprofen (ADVIL,MOTRIN) 200 MG tablet Take 400 mg by mouth every 6 (six) hours as needed for moderate pain.  . naproxen sodium (ANAPROX) 220 MG tablet Take 440 mg by mouth 2 (two) times daily as needed (pain).     Allergies:   Patient has no known allergies.   Social History   Social History  . Marital status: Widowed    Spouse name: N/A  . Number of children:  N/A  . Years of education: N/A   Occupational History  . unemployed    Social History Main Topics  . Smoking status: Current Every Day Smoker    Packs/day: 1.00    Years: 36.00    Types: Cigarettes    Start date: 67  . Smokeless tobacco: Never Used  . Alcohol use No  . Drug use: No  . Sexual activity: Yes    Birth control/ protection: None   Other Topics Concern  . None   Social History Narrative   Prior traffic Administrator, sports - unemployed now   Widow   5 children, 10 grandchildren   Moved here from [place] 2013     Family History  Problem Relation Age of Onset  . Heart attack Mother 60  . Heart disease Father   . Heart attack Father 80     ROS:   Please see the history of present illness.    Review of Systems  Cardiovascular: Positive for irregular heartbeat and leg swelling.  Musculoskeletal: Positive for joint swelling.  Neurological: Positive for dizziness.   All other systems reviewed and are negative.   EKGs/Labs/Other Test Reviewed:    EKG:  EKG is  ordered today.  The ekg ordered today demonstrates sinus brady, HR 55, low voltage, QTc 399  ms  Recent Labs: 10/04/2016: BUN 10; Creatinine, Ser 0.87; Hemoglobin 14.2; Platelets 300; Potassium 4.1; Sodium 142 10/09/2016: TSH 1.980   Recent Lipid Panel No results found for: CHOL, TRIG, HDL, CHOLHDL, VLDL, LDLCALC, LDLDIRECT   Physical Exam:    VS:  BP 108/64 (BP Location: Left Arm, Patient Position: Sitting, Cuff Size: Normal)   Pulse (!) 55   Ht 5\' 7"  (1.702 m)   Wt 178 lb 12.8 oz (81.1 kg)   SpO2 98%   BMI 28.00 kg/m     Wt Readings from Last 3 Encounters:  11/01/16 178 lb 12.8 oz (81.1 kg)  10/09/16 178 lb 6.4 oz (80.9 kg)  10/10/14 195 lb (88.5 kg)     Physical Exam  Constitutional: She is oriented to person, place, and time. She appears well-developed and well-nourished. No distress.  HENT:  Head: Normocephalic and atraumatic.  Eyes: No scleral icterus.  Neck: Normal range of  motion. No JVD present. Carotid bruit is not present.  Cardiovascular: Normal rate, regular rhythm, S1 normal, S2 normal and normal heart sounds.   No murmur heard. Pulmonary/Chest: Breath sounds normal. She has no wheezes. She has no rhonchi. She has no rales.  Abdominal: Soft. There is no tenderness.  Musculoskeletal: She exhibits no edema.  Neurological: She is alert and oriented to person, place, and time.  Skin: Skin is warm and dry.  Psychiatric: She has a normal mood and affect.    ASSESSMENT:    1. Near syncope   2. Family history of early CAD   3. Tobacco abuse    PLAN:    In order of problems listed above:  1. Near syncope -  She has had several episodes of near syncope over the years.  ECG demonstrates sinus brady but no pre-excitation, AV block.  Etiology is not clear.  She does smoke and has a strong FHx of CAD.    -  Arrange Event Monitor x 30 days  -  Arrange ETT-Echo to rule out LV dysfunction and cardiac ischemia   2. Family history of early CAD - Will arrange stress testing to screen for ischemic heart disease.  FU with PCP for risk factor modification.   3. Tobacco abuse - We discussed the importance of quitting smoking.    Dispo:  Return for Re-evaluation depending upon test results with Dr. Fransico Him or Richardson Dopp, PA-C .   Medication Adjustments/Labs and Tests Ordered: Current medicines are reviewed at length with the patient today.  Concerns regarding medicines are outlined above.  Orders placed this visit:  Orders Placed This Encounter  Procedures  . Cardiac event monitor  . Exercise Tolerance Test  . EKG 12-Lead  . ECHOCARDIOGRAM STRESS TEST   Medication changes this visit: No orders of the defined types were placed in this encounter.   Signed, Richardson Dopp, PA-C  11/01/2016 12:24 PM    [facility] [address] [place], [place], [place]  [address] Phone: [phone]; Fax: [phone]

## 2016-11-06 ENCOUNTER — Encounter: Payer: Self-pay | Admitting: Family Medicine

## 2016-11-06 ENCOUNTER — Ambulatory Visit: Payer: Self-pay | Attending: Family Medicine | Admitting: Family Medicine

## 2016-11-06 VITALS — BP 97/62 | HR 52 | Temp 98.2°F | Resp 18 | Ht 67.0 in | Wt 178.2 lb

## 2016-11-06 DIAGNOSIS — M25512 Pain in left shoulder: Secondary | ICD-10-CM

## 2016-11-06 DIAGNOSIS — Z09 Encounter for follow-up examination after completed treatment for conditions other than malignant neoplasm: Secondary | ICD-10-CM

## 2016-11-06 DIAGNOSIS — G8929 Other chronic pain: Secondary | ICD-10-CM

## 2016-11-06 MED ORDER — TRAMADOL HCL 50 MG PO TABS: 50.0000 mg | ORAL_TABLET | Freq: Three times a day (TID) | ORAL | 0 refills | Status: DC | PRN

## 2016-11-06 MED ORDER — IBUPROFEN 600 MG PO TABS: 600.0000 mg | ORAL_TABLET | Freq: Three times a day (TID) | ORAL | 0 refills | Status: DC | PRN

## 2016-11-06 NOTE — Patient Instructions (Addendum)
Follow up with paperwork from cardiology office. Complete paperwork to apply for orange card to complete referral process.   Syncope Syncope is when you temporarily lose consciousness. Syncope may also be called fainting or passing out. It is caused by a sudden decrease in blood flow to the brain. Even though most causes of syncope are not dangerous, syncope can be a sign of a serious medical problem. Signs that you may be about to faint include:  Feeling dizzy or light-headed.  Feeling nauseous.  Seeing all white or all black in your field of vision.  Having cold, clammy skin. If you fainted, get medical help right away.Call your local emergency services (911 in the [place] Do not drive yourself to the hospital. Follow these instructions at home: Pay attention to any changes in your symptoms. Take these actions to help with your condition:  Have someone stay with you until you feel stable.  Do not drive, use machinery, or play sports until your health care provider says it is okay.  Keep all follow-up visits as told by your health care provider. This is important.  If you start to feel like you might faint, lie down right away and raise (elevate) your feet above the level of your heart. Breathe deeply and steadily. Wait until all of the symptoms have passed.  Drink enough fluid to keep your urine clear or pale yellow.  If you are taking blood pressure or heart medicine, get up slowly and take several minutes to sit and then stand. This can reduce dizziness.  Take over-the-counter and prescription medicines only as told by your health care provider. Get help right away if:  You have a severe headache.  You have unusual pain in your chest, abdomen, or back.  You are bleeding from your mouth or rectum, or you have black or tarry stool.  You have a very fast or irregular heartbeat (palpitations).  You have pain with breathing.  You faint once or repeatedly.  You have a  seizure.  You are confused.  You have trouble walking.  You have severe weakness.  You have vision problems. These symptoms may represent a serious problem that is an emergency. Do not wait to see if your symptoms will go away. Get medical help right away. Call your local emergency services (911 in the [place] Do not drive yourself to the hospital. This information is not intended to replace advice given to you by your health care provider. Make sure you discuss any questions you have with your health care provider. Document Released: 06/26/2005 Document Revised: 12/02/2015 Document Reviewed: 03/10/2015 Elsevier Interactive Patient Education  2017 [place].   Hypotension As your heart beats, it forces blood through your body. This force is called blood pressure. If you have hypotension, you have low blood pressure. When your blood pressure is too low, you may not get enough blood to your brain. You may feel weak, feel light-headed, have a fast heartbeat, or even pass out (faint). Follow these instructions at home: Eating and drinking   Drink enough fluids to keep your pee (urine) clear or pale yellow.  Eat a healthy diet, and follow instructions from your doctor about eating or drinking restrictions. A healthy diet includes:  Fresh fruits and vegetables.  Whole grains.  Low-fat (lean) meats.  Low-fat dairy products.  Eat extra salt only as told. Do not add extra salt to your diet unless your doctor tells you to.  Eat small meals often.  Avoid standing up  quickly after you eat. Medicines   Take over-the-counter and prescription medicines only as told by your doctor.  Follow instructions from your doctor about changing how much you take (the dosage) of your medicines, if this applies.  Do not stop or change your medicine on your own. General instructions   Wear compression stockings as told by your doctor.  Get up slowly from lying down or sitting.  Avoid hot  showers and a lot of heat as told by your doctor.  Return to your normal activities as told by your doctor. Ask what activities are safe for you.  Do not use any products that contain nicotine or tobacco, such as cigarettes and e-cigarettes. If you need help quitting, ask your doctor.  Keep all follow-up visits as told by your doctor. This is important. Contact a doctor if:  You throw up (vomit).  You have watery poop (diarrhea).  You have a fever for more than 2-3 days.  You feel more thirsty than normal.  You feel weak and tired. Get help right away if:  You have chest pain.  You have a fast or irregular heartbeat.  You lose feeling (get numbness) in any part of your body.  You cannot move your arms or your legs.  You have trouble talking.  You get sweaty or feel light-headed.  You faint.  You have trouble breathing.  You have trouble staying awake.  You feel confused. This information is not intended to replace advice given to you by your health care provider. Make sure you discuss any questions you have with your health care provider. Document Released: 09/20/2009 Document Revised: 03/14/2016 Document Reviewed: 03/14/2016 Elsevier Interactive Patient Education  2017 [place].  Shoulder Pain Many things can cause shoulder pain, including:  An injury to the area.  Overuse of the shoulder.  Arthritis. The source of the pain can be:  Inflammation.  An injury to the shoulder joint.  An injury to a tendon, ligament, or bone. Follow these instructions at home: Take these actions to help with your pain:  Squeeze a soft ball or a foam pad as much as possible. This helps to keep the shoulder from swelling. It also helps to strengthen the arm.  Take over-the-counter and prescription medicines only as told by your health care provider.  If directed, apply ice to the area:  Put ice in a plastic bag.  Place a towel between your skin and the  bag.  Leave the ice on for 20 minutes, 2-3 times per day. Stop applying ice if it does not help with the pain.  If you were given a shoulder sling or immobilizer:  Wear it as told.  Remove it to shower or bathe.  Move your arm as little as possible, but keep your hand moving to prevent swelling. Contact a health care provider if:  Your pain gets worse.  Your pain is not relieved with medicines.  New pain develops in your arm, hand, or fingers. Get help right away if:  Your arm, hand, or fingers:  Tingle.  Become numb.  Become swollen.  Become painful.  Turn white or blue. This information is not intended to replace advice given to you by your health care provider. Make sure you discuss any questions you have with your health care provider. Document Released: 04/05/2005 Document Revised: 02/20/2016 Document Reviewed: 10/19/2014 Elsevier Interactive Patient Education  2017 [employer].

## 2016-11-06 NOTE — Progress Notes (Signed)
Patient is here for f/up  Patient only takes advil    Patient has not eaten for today  Patient complains about left shoulder/Arm

## 2016-11-06 NOTE — Progress Notes (Signed)
Subjective:  Patient ID: Katie Sherman, female    DOB: 11/07/1965  Age: 51 y.o. MRN: [id number]  CC: Establish Care   HPI Katie Sherman presents for   Chronic shoulder pain: 3 months. Reports aching with decreased ROM. reports pain makes it difficult to sleep at night. Denies any history of injury or heaving lifting Denies any paresthesia symptoms.  Follow up: History of dizziness and bradycardia. reports following up with cardiology. Echo TEE and cardiac monitoring were recommended. Reports last episode of near syncope 2 within the last 4 weeks. Reports episodes usually last 3 to 4 minutes. Orthostatic VS negative this visit.     Outpatient Medications Prior to Visit  Medication Sig Dispense Refill  . ibuprofen (ADVIL,MOTRIN) 200 MG tablet Take 400 mg by mouth every 6 (six) hours as needed for moderate pain.    . naproxen sodium (ANAPROX) 220 MG tablet Take 440 mg by mouth 2 (two) times daily as needed (pain).     No facility-administered medications prior to visit.     ROS Review of Systems  Constitutional: Negative.   Respiratory: Negative.   Cardiovascular: Negative.   Gastrointestinal: Negative.   Musculoskeletal: Positive for arthralgias and myalgias.  Skin: Negative.         Objective:  BP 97/62 (BP Location: Left Arm, Patient Position: Sitting, Cuff Size: Normal)   Pulse (!) 52   Temp 98.2 F (36.8 C) (Oral)   Resp 18   Ht 5\' 7"  (1.702 m)   Wt 178 lb 3.2 oz (80.8 kg)   SpO2 98%   BMI 27.91 kg/m   BP/Weight 11/06/2016 4/33/2951 02/14/4165  Systolic BP 97 063 016  Diastolic BP 62 64 84  Wt. (Lbs) 178.2 178.8 178.4  BMI 27.91 28 27.94     Physical Exam  Constitutional: She is oriented to person, place, and time. She appears well-developed and well-nourished.  HENT:  Head: Normocephalic and atraumatic.  Right Ear: External ear normal.  Left Ear: External ear normal.  Mouth/Throat: Oropharynx is clear and moist.  Eyes: Conjunctivae and EOM are  normal. Pupils are equal, round, and reactive to light.  Neck: No JVD present.  Cardiovascular: Normal rate, regular rhythm, normal heart sounds and intact distal pulses.   Pulmonary/Chest: Effort normal and breath sounds normal.  Abdominal: Soft. Bowel sounds are normal.  Musculoskeletal:       Left shoulder: She exhibits decreased range of motion and pain.  Neurological: She is alert and oriented to person, place, and time.  Skin: Skin is warm and dry.  Psychiatric: She has a normal mood and affect.  Nursing note and vitals reviewed.   Assessment & Plan:   Problem List Items Addressed This Visit    None    Visit Diagnoses    Chronic left shoulder pain    -  Primary   -Per previous x-ray MRI was recommended if symptoms persist.   -Patient reports financial constraints and is currently applying for orange card.    -Option given to order MRI now or to wait. She prefers to wait at this time.   Relevant Medications   traMADol (ULTRAM) 50 MG tablet   ibuprofen (ADVIL,MOTRIN) 600 MG tablet   Other Relevant Orders   Ambulatory referral to Orthopedics   Follow up       -Follow up with upcoming cardiologist appointments and paperwork.    -Apply for orange card      Meds ordered this encounter  Medications  . traMADol (ULTRAM) 50 MG  tablet    Sig: Take 1 tablet (50 mg total) by mouth every 8 (eight) hours as needed for severe pain.    Dispense:  30 tablet    Refill:  0    Order Specific Question:   Supervising Provider    Answer:   Tresa Garter [id number]  . ibuprofen (ADVIL,MOTRIN) 600 MG tablet    Sig: Take 1 tablet (600 mg total) by mouth every 8 (eight) hours as needed for moderate pain or cramping (Take with food.).    Dispense:  30 tablet    Refill:  0    Order Specific Question:   Supervising Provider    Answer:   Tresa Garter [id number]    Follow-up: No Follow-up on file.   Alfonse Spruce FNP

## 2016-11-16 ENCOUNTER — Telehealth (HOSPITAL_COMMUNITY): Payer: Self-pay | Admitting: *Deleted

## 2016-11-16 NOTE — Telephone Encounter (Signed)
Left message on voicemail per DPR in reference to upcoming appointment scheduled on 11/22/16 at 2:30 with detailed instructions given per Stress Test Requisition Sheet for the test. LM to arrive 30 minutes early, and that it is imperative to arrive on time for appointment to keep from having the test rescheduled. If you need to cancel or reschedule your appointment, please call the office within 24 hours of your appointment. Failure to do so may result in a cancellation of your appointment, and a $50 no show fee. Phone number given for call back for any questions. Katie Sherman

## 2016-11-17 ENCOUNTER — Ambulatory Visit: Payer: Self-pay | Attending: Family Medicine

## 2016-11-17 ENCOUNTER — Ambulatory Visit (INDEPENDENT_AMBULATORY_CARE_PROVIDER_SITE_OTHER): Payer: Self-pay | Admitting: Student

## 2016-11-17 ENCOUNTER — Encounter: Payer: Self-pay | Admitting: Student

## 2016-11-17 VITALS — BP 131/70 | Ht 67.0 in | Wt 175.0 lb

## 2016-11-17 DIAGNOSIS — M7061 Trochanteric bursitis, right hip: Secondary | ICD-10-CM

## 2016-11-17 DIAGNOSIS — M7502 Adhesive capsulitis of left shoulder: Secondary | ICD-10-CM

## 2016-11-17 MED ORDER — METHYLPREDNISOLONE ACETATE 40 MG/ML IJ SUSP
40.0000 mg | Freq: Once | INTRAMUSCULAR | Status: AC
  Administered 2016-11-17: 40 mg via INTRA_ARTICULAR

## 2016-11-17 NOTE — Progress Notes (Signed)
Subjective:     Patient ID: Katie Sherman, female   DOB: 06-08-1966, 51 y.o.   MRN: [id number]  HPI Patient is a 51 year old female with decreased L shoulder mobility and pain x 4 months. She reports that she feels a dull ache in her shoulder which has gradually worsened since Jan 2018. She is limited in her ability to abduct overhead or reach behind her back. Pain awakens her from sleep and she is getting less sleep as left shoulder + right hip pain. 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  + trochanteric bursitis bilaterally R>L which has improved previously with steroid injections.  + carpal tunnel R arm + lateral epicondylitis R arm PSHx: + cervical cancer removed 1993  She is here for steroid injections of troch bursa and shoulder.  Review of Systems + arthritis in multiple joints +pain - paralysis, tremors, spasm -fevers, chills -numbness/tingling -instability, swelling    Objective:   Physical Exam General: Uncomfortable appearing woman sitting on exam table. NAD. A&O x3. Extremities: Warm, well perfused. 2+ bilateral radial pulses. Cap refill <3 sec.  On inspection: No obvious deformities or swelling. No erythema. MSK: TTP over L AC joint and L coracoid. TTP over R femoral trochanter bursa. ROM left shoulder: ER 25 degrees, abduction 45. Extension and IR severely limited. Flexion 60 degrees.  Strength: 4/5 on straight arm bicep flexion. 5/5 L arm abduction. Neuro: Full sensation, equal bilaterally over upper extremities.     Assessment:     Patient is 51 year old female with left shoulder soreness and decreased mobility x 4 months which is most consistent with adhesive capsulitis. She is likely still in the freezing stage as it has continued to worsen. Discussed prognosis and stretching exercises today. Stressed importance of mobility over bracing shoulder. She will benefit from steroid injections in L shoulder and bilateral troch bursa today.     Plan:     Left Shoulder  Pain: - Progressive mobility by stretching, discussed hand walk up wall in shower -no sling indicated - Ice vs Heat prn discomfort - NSAIDs prn pain - PT order in, gave her phone numbers if she wants to try. - Steroid injection today L GH joint, 7cc 1% lidocaine, 40 mg depomedrol -consider GH injections every 3-4 months to help with mobility and pain  Bilateral bursitis: - Steroid injection today 6cc lidocaine,80mg  depomedrol to right greater trochanter    Procedure:  Injection of left glenohumeral joint Consent obtained and verified. Time-out conducted. Noted no overlying erythema, induration, or other signs of local infection. Skin prepped in a sterile fashion. Topical analgesic spray: Ethyl chloride. Completed without difficulty. Meds: 40 mg depo-medrol, 7 cc 1% lidocaine Pain immediately improved suggesting accurate placement of the medication. Advised to call if fevers/chills, erythema, induration, drainage, or persistent bleeding.    Procedure:  Injection of right greater trochanter Consent obtained and verified. Time-out conducted. Noted no overlying erythema, induration, or other signs of local infection. Skin prepped in a sterile fashion. Topical analgesic spray: Ethyl chloride. Completed without difficulty. Meds: 80 mg depo-medrol, 6 cc 1% lidocaine Pain immediately improved suggesting accurate placement of the medication. Advised to call if fevers/chills, erythema, induration, drainage, or persistent bleeding.

## 2016-11-19 DIAGNOSIS — M7502 Adhesive capsulitis of left shoulder: Secondary | ICD-10-CM | POA: Insufficient documentation

## 2016-11-19 DIAGNOSIS — M7061 Trochanteric bursitis, right hip: Secondary | ICD-10-CM | POA: Insufficient documentation

## 2016-11-20 ENCOUNTER — Ambulatory Visit: Payer: No Typology Code available for payment source | Admitting: Licensed Clinical Social Worker

## 2016-11-20 ENCOUNTER — Ambulatory Visit: Payer: No Typology Code available for payment source | Attending: Family Medicine | Admitting: Family Medicine

## 2016-11-20 VITALS — BP 109/68 | HR 62 | Temp 97.9°F | Resp 18 | Ht 67.0 in | Wt 178.4 lb

## 2016-11-20 DIAGNOSIS — K219 Gastro-esophageal reflux disease without esophagitis: Secondary | ICD-10-CM

## 2016-11-20 DIAGNOSIS — R102 Pelvic and perineal pain unspecified side: Secondary | ICD-10-CM

## 2016-11-20 DIAGNOSIS — Z803 Family history of malignant neoplasm of breast: Secondary | ICD-10-CM

## 2016-11-20 DIAGNOSIS — F32A Depression, unspecified: Secondary | ICD-10-CM

## 2016-11-20 DIAGNOSIS — Z01419 Encounter for gynecological examination (general) (routine) without abnormal findings: Secondary | ICD-10-CM

## 2016-11-20 DIAGNOSIS — F329 Major depressive disorder, single episode, unspecified: Secondary | ICD-10-CM

## 2016-11-20 DIAGNOSIS — M7061 Trochanteric bursitis, right hip: Secondary | ICD-10-CM

## 2016-11-20 DIAGNOSIS — Z8541 Personal history of malignant neoplasm of cervix uteri: Secondary | ICD-10-CM

## 2016-11-20 DIAGNOSIS — Z818 Family history of other mental and behavioral disorders: Secondary | ICD-10-CM | POA: Insufficient documentation

## 2016-11-20 DIAGNOSIS — Z79899 Other long term (current) drug therapy: Secondary | ICD-10-CM | POA: Insufficient documentation

## 2016-11-20 LAB — POCT URINALYSIS DIPSTICK
Bilirubin, UA: NEGATIVE
Blood, UA: NEGATIVE
GLUCOSE UA: NEGATIVE
KETONES UA: NEGATIVE
LEUKOCYTES UA: NEGATIVE
NITRITE UA: NEGATIVE
Spec Grav, UA: 1.02 (ref 1.010–1.025)
Urobilinogen, UA: 0.2 E.U./dL
pH, UA: 5.5 (ref 5.0–8.0)

## 2016-11-20 MED ORDER — RANITIDINE HCL 150 MG PO TABS: 150.0000 mg | ORAL_TABLET | Freq: Two times a day (BID) | ORAL | 0 refills | Status: DC | PRN

## 2016-11-20 MED ORDER — IBUPROFEN 600 MG PO TABS: 600.0000 mg | ORAL_TABLET | Freq: Four times a day (QID) | ORAL | 0 refills | Status: DC | PRN

## 2016-11-20 MED FILL — IBUPROFEN 600 MG TABLET: 600 | 8 days supply | Qty: 30 | Fill #0

## 2016-11-20 NOTE — Progress Notes (Signed)
Subjective:  Patient ID: Katie Sherman, female    DOB: 04/04/1966  Age: 51 y.o. MRN: [id number]  CC: Gynecologic Exam   HPI Guneet Delpino presents for    Well woman visit with gynecological exam: Denies any vaginal discharge, vagina lesions, or dysuria. Denies any nipple discharge,  lumps, denting, or dimpling of the breasts. She reports any family history of breast cancer. Her maternal GM and aunt. She reports history of mm screening at age 71. She reports history of cervical cancer with partial cervix removal at 51 y.o. She denies any family history of gynecological cancers. She does not perform regular breast examinations. She would like STD testing. Declines any testing for HIV, syphilis, or hepatitis.    Acute hip pain: Symptom began today. Recent history of steroid injection. Right hip pain this am while walking. [place]. Denies any difficulty bearing weight or limited ROM. Denies taking anything for symptoms.   Depression: Patient complains of depression. She complains of depressed mood, feelings of worthlessness/guilt, recurrent thoughts of death and suicidal thoughts with specific plan. Onset was approximately several years ago, gradually worsening since that time.  She denies current suicidal and homicidal plan or intent.   Family history significant for depression and suicide.Possible organic causes contributing are: none.  Risk factors: positive family history in  brother(s) and ex husband, negative life event including the suicide of her brother last year, the suicide of her ex husband 7 1/2 years ago, and the suicide of her friend 2 weeks ago. and previous episode of depression Previous treatment includes She reports receiving mental health services in 2013 and was placed on anti-depressants. She reports only taking the anti-depressants for two months.due to loss of libido. She declines medications at this time. She is agreeable to  speaking with LCSW today.    GERD: 2 months ago epigastric pain, heartburn, and nausea that she reports occurs early morning or afternoon. Denies taking anything for symptoms.    Outpatient Medications Prior to Visit  Medication Sig Dispense Refill  . traMADol (ULTRAM) 50 MG tablet Take 1 tablet (50 mg total) by mouth every 8 (eight) hours as needed for severe pain. 30 tablet 0  . ibuprofen (ADVIL,MOTRIN) 600 MG tablet Take 1 tablet (600 mg total) by mouth every 8 (eight) hours as needed for moderate pain or cramping (Take with food.). 30 tablet 0   No facility-administered medications prior to visit.     ROS Review of Systems  Constitutional: Negative.   Respiratory: Negative.   Cardiovascular: Negative.   Gastrointestinal: Positive for abdominal pain (epigastric w/ heartburn) and nausea.  Genitourinary: Negative.   Psychiatric/Behavioral: Positive for dysphoric mood.        Objective:  BP 109/68 (BP Location: Right Arm, Patient Position: Sitting, Cuff Size: Normal)   Pulse 62   Temp 97.9 F (36.6 C) (Oral)   Resp 18   Ht '5\' 7"'  (1.702 m)   Wt 178 lb 6.4 oz (80.9 kg)   SpO2 100%   BMI 27.94 kg/m   BP/Weight 11/20/2016 11/17/2016 07/24/5206  Systolic BP 022 336 97  Diastolic BP 68 70 62  Wt. (Lbs) 178.4 175 178.2  BMI 27.94 27.41 27.91     Physical Exam  Constitutional: She appears well-developed and well-nourished.  HENT:  Head: Normocephalic and atraumatic.  Right Ear: External ear normal.  Left Ear: External ear normal.  Nose: Nose normal.  Mouth/Throat: Oropharynx is clear and moist.  Eyes:  Conjunctivae are normal.  Neck: Normal range of motion.  Cardiovascular: Normal rate, regular rhythm, normal heart sounds and intact distal pulses.   Pulmonary/Chest: Effort normal and breath sounds normal. Right breast exhibits no mass, no nipple discharge, no skin change and no tenderness. Left breast exhibits no mass, no nipple discharge, no skin change and no  tenderness.  Abdominal: Soft. Bowel sounds are normal.  Genitourinary: Vagina normal. Cervix exhibits no discharge.  Musculoskeletal:  Right hip pain with movement  Lymphadenopathy:    She has no cervical adenopathy.  Skin: Skin is warm and dry.  Psychiatric: Her speech is normal. She exhibits a depressed mood. She expresses no homicidal and no suicidal ideation. She expresses no suicidal plans and no homicidal plans.  Nursing note and vitals reviewed.  Assessment & Plan:   Problem List Items Addressed This Visit      Musculoskeletal and Integument   Greater trochanteric bursitis of right hip   Relevant Medications   ibuprofen (ADVIL,MOTRIN) 600 MG tablet     Other   Family history of suicide   Depression   LCSW met with patient and provided counseling resources.   Family history of breast cancer   Relevant Orders   MM DIAG BREAST TOMO BILATERAL    Other Visit Diagnoses    Well woman exam with routine gynecological exam    -  Primary   Relevant Orders   Cytology - PAP [place] (Completed)   Gastroesophageal reflux disease without esophagitis       Relevant Medications   ranitidine (ZANTAC) 150 MG tablet   History of cervical cancer       Relevant Orders   Ambulatory referral to Gynecology   Pelvic pain in female       Relevant Orders   Urinalysis Dipstick (Completed)      Meds ordered this encounter  Medications  . ibuprofen (ADVIL,MOTRIN) 600 MG tablet    Sig: Take 1 tablet (600 mg total) by mouth every 6 (six) hours as needed for moderate pain or cramping (Take with food.).    Dispense:  30 tablet    Refill:  0    Order Specific Question:   Supervising Provider    Answer:   Tresa Garter [id number]  . ranitidine (ZANTAC) 150 MG tablet    Sig: Take 1 tablet (150 mg total) by mouth 2 (two) times daily as needed for heartburn.    Dispense:  60 tablet    Refill:  0    Order Specific Question:   Supervising Provider    Answer:   Tresa Garter  [[id number]]     Alfonse Spruce FNP

## 2016-11-20 NOTE — Patient Instructions (Signed)
Breast Self-Awareness Breast self-awareness means:  Knowing how your breasts look.  Knowing how your breasts feel.  Checking your breasts every month for changes.  Telling your doctor if you notice a change in your breasts. Breast self-awareness allows you to notice a breast problem early while it is still small. How to do a breast self-exam One way to learn what is normal for your breasts and to check for changes is to do a breast self-exam. To do a breast self-exam: Look for Changes   1. Take off all the clothes above your waist. 2. Stand in front of a mirror in a room with good lighting. 3. Put your hands on your hips. 4. Push your hands down. 5. Look at your breasts and nipples in the mirror to see if one breast or nipple looks different than the other. Check to see if:  The shape of one breast is different.  The size of one breast is different.  There are wrinkles, dips, and bumps in one breast and not the other. 6. Look at each breast for changes in your skin, such as:  Redness.  Scaly areas. 7. Look for changes in your nipples, such as:  Liquid around the nipples.  Bleeding.  Dimpling.  Redness.  A change in where the nipples are. Feel for Changes  1. Lie on your back on the floor. 2. Feel each breast. To do this, follow these steps:  Pick a breast to feel.  Put the arm closest to that breast above your head.  Use your other arm to feel the nipple area of your breast. Feel the area with the pads of your three middle fingers by making small circles with your fingers. For the first circle, press lightly. For the second circle, press harder. For the third circle, press even harder.  Keep making circles with your fingers at the light, harder, and even harder pressures as you move down your breast. Stop when you feel your ribs.  Move your fingers a little toward the center of your body.  Start making circles with your fingers again, this time going up until  you reach your collarbone.  Keep making up and down circles until you reach your armpit. Remember to keep using the three pressures.  Feel the other breast in the same way. 3. Sit or stand in the shower or tub. 4. With soapy water on your skin, feel each breast the same way you did in step 2, when you were lying on the floor. Write Down What You Find   After doing the self-exam, write down:  What is normal for each breast.  Any changes you find in each breast.  When you last had your period. How often should I check my breasts? Check your breasts every month. If you are breastfeeding, the best time to check them is after you feed your baby or after you use a breast pump. If you get periods, the best time to check your breasts is 5-7 days after your period is over. When should I see my doctor? See your doctor if you notice:  A change in shape or size of your breasts or nipples.  A change in the skin of your breast or nipples, such as red or scaly skin.  Unusual fluid coming from your nipples.  A lump or thick area that was not there before.  Pain in your breasts.  Anything that concerns you. This information is not intended to replace advice given to  you by your health care provider. Make sure you discuss any questions you have with your health care provider. Document Released: 12/13/2007 Document Revised: 12/02/2015 Document Reviewed: 05/16/2015 Elsevier Interactive Patient Education  2017 [place] With Depression Everyone experiences occasional disappointment, sadness, and loss in their lives. When you are feeling down, blue, or sad for at least 2 weeks in a row, it may mean that you have depression. Depression can affect your thoughts and feelings, relationships, daily activities, and physical health. It is caused by changes in the way your brain functions. If you receive a diagnosis of depression, your health care provider will tell you which type of  depression you have and what treatment options are available to you. If you are living with depression, there are ways to help you recover from it and also ways to prevent it from coming back. How to cope with lifestyle changes Coping with stress  Stress is your body's reaction to life changes and events, both good and bad. Stressful situations may include:  Getting married.  The death of a spouse.  Losing a job.  Retiring.  Having a baby. Stress can last just a few hours or it can be ongoing. Stress can play a major role in depression, so it is important to learn both how to cope with stress and how to think about it differently. Talk with your health care provider or a counselor if you would like to learn more about stress reduction. He or she may suggest some stress reduction techniques, such as:  Music therapy. This can include creating music or listening to music. Choose music that you enjoy and that inspires you.  Mindfulness-based meditation. This kind of meditation can be done while sitting or walking. It involves being aware of your normal breaths, rather than trying to control your breathing.  Centering prayer. This is a kind of meditation that involves focusing on a spiritual word or phrase. Choose a word, phrase, or sacred image that is meaningful to you and that brings you peace.  Deep breathing. To do this, expand your stomach and inhale slowly through your nose. Hold your breath for 3-5 seconds, then exhale slowly, allowing your stomach muscles to relax.  Muscle relaxation. This involves intentionally tensing muscles then relaxing them. Choose a stress reduction technique that fits your lifestyle and personality. Stress reduction techniques take time and practice to develop. Set aside 5-15 minutes a day to do them. Therapists can offer training in these techniques. The training may be covered by some insurance plans. Other things you can do to manage stress  include:  Keeping a stress diary. This can help you learn what triggers your stress and ways to control your response.  Understanding what your limits are and saying no to requests or events that lead to a schedule that is too full.  Thinking about how you respond to certain situations. You may not be able to control everything, but you can control how you react.  Adding humor to your life by watching funny films or TV shows.  Making time for activities that help you relax and not feeling guilty about spending your time this way. Medicines  Your health care provider may suggest certain medicines if he or she feels that they will help improve your condition. Avoid using alcohol and other substances that may prevent your medicines from working properly (may interact). It is also important to:  Talk with your pharmacist or health care provider about  all the medicines that you take, their possible side effects, and what medicines are safe to take together.  Make it your goal to take part in all treatment decisions (shared decision-making). This includes giving input on the side effects of medicines. It is best if shared decision-making with your health care provider is part of your total treatment plan. If your health care provider prescribes a medicine, you may not notice the full benefits of it for 4-8 weeks. Most people who are treated for depression need to be on medicine for at least 6-12 months after they feel better. If you are taking medicines as part of your treatment, do not stop taking medicines without first talking to your health care provider. You may need to have the medicine slowly decreased (tapered) over time to decrease the risk of harmful side effects. Relationships  Your health care provider may suggest family therapy along with individual therapy and drug therapy. While there may not be family problems that are causing you to feel depressed, it is still important to make sure  your family learns as much as they can about your mental health. Having your family's support can help make your treatment successful. How to recognize changes in your condition Everyone has a different response to treatment for depression. Recovery from major depression happens when you have not had signs of major depression for two months. This may mean that you will start to:  Have more interest in doing activities.  Feel less hopeless than you did 2 months ago.  Have more energy.  Overeat less often, or have better or improving appetite.  Have better concentration. Your health care provider will work with you to decide the next steps in your recovery. It is also important to recognize when your condition is getting worse. Watch for these signs:  Having fatigue or low energy.  Eating too much or too little.  Sleeping too much or too little.  Feeling restless, agitated, or hopeless.  Having trouble concentrating or making decisions.  Having unexplained physical complaints.  Feeling irritable, angry, or aggressive. Get help as soon as you or your family members notice these symptoms coming back. How to get support and help from others How to talk with friends and family members about your condition  Talking to friends and family members about your condition can provide you with one way to get support and guidance. Reach out to trusted friends or family members, explain your symptoms to them, and let them know that you are working with a health care provider to treat your depression. Financial resources  Not all insurance plans cover mental health care, so it is important to check with your insurance carrier. If paying for co-pays or counseling services is a problem, search for a local or county mental health care center. They may be able to offer public mental health care services at low or no cost when you are not able to see a private health care provider. If you are taking  medicine for depression, you may be able to get the generic form, which may be less expensive. Some makers of prescription medicines also offer help to patients who cannot afford the medicines they need. Follow these instructions at home:  Get the right amount and quality of sleep.  Cut down on using caffeine, tobacco, alcohol, and other potentially harmful substances.  Try to exercise, such as walking or lifting small weights.  Take over-the-counter and prescription medicines only as told by your health  care provider.  Eat a healthy diet that includes plenty of vegetables, fruits, whole grains, low-fat dairy products, and lean protein. Do not eat a lot of foods that are high in solid fats, added sugars, or salt.  Keep all follow-up visits as told by your health care provider. This is important. Contact a health care provider if:  You stop taking your antidepressant medicines, and you have any of these symptoms:  Nausea.  Headache.  Feeling lightheaded.  Chills and body aches.  Not being able to sleep (insomnia).  You or your friends and family think your depression is getting worse. Get help right away if:  You have thoughts of hurting yourself or others. If you ever feel like you may hurt yourself or others, or have thoughts about taking your own life, get help right away. You can go to your nearest emergency department or call:  Your local emergency services (911 in the [place]  A suicide crisis helpline, such as the [facility] at 775-456-1885. This is open 24-hours a day. Summary  If you are living with depression, there are ways to help you recover from it and also ways to prevent it from coming back.  Work with your health care team to create a management plan that includes counseling, stress management techniques, and healthy lifestyle habits. This information is not intended to replace advice given to you by your health care provider. Make  sure you discuss any questions you have with your health care provider. Document Released: 05/29/2016 Document Revised: 05/29/2016 Document Reviewed: 05/29/2016 Elsevier Interactive Patient Education  2017 [employer].

## 2016-11-20 NOTE — Progress Notes (Signed)
Patient is here for PAP & tetanus shot  Patient also needs refill on her tramadol  Patient complains about right hip pain

## 2016-11-20 NOTE — BH Specialist Note (Signed)
Integrated Behavioral Health Initial Visit  MRN: [id number] Name: Katie Sherman   Session Start time: 11:30 AM Session End time: 12:05 PM Total time: 35 minutes  Type of Service: [place] Interpretor:No. Interpretor Name and Language: N/A   Warm Hand Off Completed.       SUBJECTIVE: Katie Sherman is a 51 y.o. female accompanied by patient. Patient was referred by FNP [place] for depression. Patient reports the following symptoms/concerns: overwhelming feelings of sadness, difficulty sleeping, decreased energy, and suicidal ideations Duration of problem: Ongoing "since I was a teenager"; Severity of problem: severe  OBJECTIVE: Mood: Anxious and Affect: Appropriate Risk of harm to self or others: No plan to harm self or others   LIFE CONTEXT: Family and Social: Pt reports having four adult daughters (two reside in [place], one in [place], and in [place]) and an adult son (resides in [place], [place]) She has a strained relationship with her father. Pt is currently residing with niece; however, needs to be out of the home by June 2018 School/Work: Pt is currently unemployed. She does not receive Medicaid or Disability Self-Care: Pt smokes cigarettes daily and marijuana (approx once a week) Life Changes: Pt stated that her husband completed suicide seven year ago. She is grieving the loss of her older brother (June 2017) and friend (April 2018) who both completed suicide.    GOALS ADDRESSED: Patient will reduce symptoms of: anxiety and depression and increase knowledge and/or ability of: coping skills and also: Increase adequate support systems for patient/family and Begin healthy grieving over loss   INTERVENTIONS: Solution-Focused Strategies, Supportive Counseling, Psychoeducation and/or Health Education and Link to [employer]  Standardized Assessments completed: GAD-7, PHQ 2 and PHQ 9  ASSESSMENT: Patient currently experiencing depression and  anxiety triggered by the loss of multiple loved ones and financial strain. Pt reports overwhelming feelings of sadness, difficulty sleeping, decreased energy, and suicidal ideations. Patient may benefit from psychoeducation, psychotherapy, and medication management. [place] educated pt on the cycle of depression and discussed how substance use can negatively impact one's mental and physical health. LCSWA inquired about protective factors with pt. Pt reported that she has never attempted to complete suicide and would not want to leave her family stating, "I know how hard it is to deal with losing someone from suicide and I could not do that to them" Pt does not have access to weapons or medications in the home. LCSWA discussed safety plan with patient and provided crisis intervention resources. [place] educated pt on the stages of grief and encouraged her to participate in grief counseling and support. Pt was provided grief resources and disclosed willingness to re-initiate counseling and medication management with a community agency. She is currently on probation and is scheduled to complete a drug assessment through [facility]. LCSWA engaged pt on benefits of applying healthy coping skills. Pt identified a healthy coping skill to utilize on a daily basis to decrease symptoms of depression and anxiety. Pt has orange card and is aware of transportation and housing resources in the community.   PLAN: 1. Follow up with behavioral health clinician on : Pt was encouraged to contact [place] if symptoms worsen or fail to improve to schedule behavioral appointments at [facility]. 2. Behavioral recommendations: LCSWA recommends that pt apply healthy coping skills discussed and initiate psychotherapy and medication management. Pt is encouraged to schedule follow up appointment with LCSWA 3. Referral(s): Armed forces logistics/support/administrative officer (LME/Outside Clinic) and [employer]:  Housing and Transportation 4. "From scale of 1-10,  how  likely are you to follow plan?": 7/10  Rebekah [place]field, LCSW 11/20/16 5:12 PM

## 2016-11-21 LAB — CYTOLOGY - PAP
Diagnosis: NEGATIVE
HPV: NOT DETECTED

## 2016-11-21 LAB — CERVICOVAGINAL ANCILLARY ONLY
BACTERIAL VAGINITIS: NEGATIVE
CANDIDA VAGINITIS: NEGATIVE
Chlamydia: NEGATIVE
NEISSERIA GONORRHEA: NEGATIVE
Trichomonas: NEGATIVE

## 2016-11-22 ENCOUNTER — Ambulatory Visit (HOSPITAL_COMMUNITY): Payer: No Typology Code available for payment source | Attending: Cardiovascular Disease

## 2016-11-22 ENCOUNTER — Ambulatory Visit (HOSPITAL_COMMUNITY): Payer: No Typology Code available for payment source

## 2016-11-22 ENCOUNTER — Encounter: Payer: Self-pay | Admitting: *Deleted

## 2016-11-22 DIAGNOSIS — Z5309 Procedure and treatment not carried out because of other contraindication: Secondary | ICD-10-CM | POA: Insufficient documentation

## 2016-11-22 DIAGNOSIS — Z8249 Family history of ischemic heart disease and other diseases of the circulatory system: Secondary | ICD-10-CM | POA: Insufficient documentation

## 2016-11-22 DIAGNOSIS — R55 Syncope and collapse: Secondary | ICD-10-CM | POA: Insufficient documentation

## 2016-11-22 NOTE — Progress Notes (Signed)
Patient ID: Katie Sherman, female   DOB: [birth year], 51 y.o.   MRN: [id number] Patient enrolled for [employer] to mail a cardiac event monitor to patient c/o [facility], [address], [place], [place] [address], [phone], if approved for the [employer] hardship program.  If [employer], unable to ship to this address, we can schedule to have the patients monitor applied in our office using a monitor from our [employer]/ [place].

## 2016-11-23 ENCOUNTER — Telehealth: Payer: Self-pay | Admitting: *Deleted

## 2016-11-23 ENCOUNTER — Encounter: Payer: Self-pay | Admitting: Physician Assistant

## 2016-11-23 ENCOUNTER — Other Ambulatory Visit: Payer: Self-pay | Admitting: Family Medicine

## 2016-11-23 DIAGNOSIS — K219 Gastro-esophageal reflux disease without esophagitis: Secondary | ICD-10-CM

## 2016-11-23 LAB — CERVICOVAGINAL ANCILLARY ONLY: Herpes: NEGATIVE

## 2016-11-23 MED ORDER — RANITIDINE HCL 150 MG PO TABS: 150.0000 mg | ORAL_TABLET | Freq: Two times a day (BID) | ORAL | 0 refills | Status: DC | PRN

## 2016-11-23 MED FILL — raNITIdine HCL 150 MG TABS: 150 | 30 days supply | Qty: 60 | Fill #0

## 2016-11-23 NOTE — Telephone Encounter (Signed)
-----   Message from Liliane Shi, [place] sent at 11/23/2016  8:08 AM EDT ----- Please call the patient. The stress test shows normal ejection fraction and no evidence of ischemia.  The stress test is normal. Continue current management. Please route a copy of this study result to her PCP:  Alfonse Spruce, FNP  Richardson Dopp, PA-C    11/23/2016 8:08 AM

## 2016-11-23 NOTE — Telephone Encounter (Signed)
Patient informed and copy sent to PCP. 

## 2016-11-23 NOTE — Telephone Encounter (Signed)
Pt calling to see if she can get a script written for Zantac because she has no money to get it OTC. Please f/u.

## 2016-11-23 NOTE — Telephone Encounter (Signed)
Sent rx to [facility] pharmacy.

## 2016-11-27 ENCOUNTER — Telehealth: Payer: Self-pay

## 2016-11-27 NOTE — Telephone Encounter (Signed)
CMA call regarding PAP results  Patient did not answer but left a VM stating the reason of the call & to call me back

## 2016-11-27 NOTE — Telephone Encounter (Signed)
-----   Message from Alfonse Spruce, [place] sent at 11/27/2016 10:40 AM EDT ----- -Pap is normal. -Herpes, Gonorrhea, Chlamydia, BV, Yeast, and Trichomonas were all negative.

## 2016-12-11 ENCOUNTER — Ambulatory Visit (INDEPENDENT_AMBULATORY_CARE_PROVIDER_SITE_OTHER): Payer: No Typology Code available for payment source

## 2016-12-11 DIAGNOSIS — R001 Bradycardia, unspecified: Secondary | ICD-10-CM

## 2016-12-11 DIAGNOSIS — R55 Syncope and collapse: Secondary | ICD-10-CM

## 2016-12-23 ENCOUNTER — Encounter (HOSPITAL_COMMUNITY): Payer: Self-pay | Admitting: Emergency Medicine

## 2016-12-23 ENCOUNTER — Emergency Department (HOSPITAL_COMMUNITY)
Admission: EM | Admit: 2016-12-23 | Discharge: 2016-12-23 | Disposition: A | Discharge status: Home / Self Care | Payer: No Typology Code available for payment source | Attending: Emergency Medicine | Admitting: Emergency Medicine

## 2016-12-23 ENCOUNTER — Emergency Department (HOSPITAL_BASED_OUTPATIENT_CLINIC_OR_DEPARTMENT_OTHER)
Admit: 2016-12-23 | Discharge: 2016-12-23 | Disposition: A | Discharge status: Home / Self Care | Payer: No Typology Code available for payment source | Attending: Emergency Medicine | Admitting: Emergency Medicine

## 2016-12-23 DIAGNOSIS — F17210 Nicotine dependence, cigarettes, uncomplicated: Secondary | ICD-10-CM | POA: Insufficient documentation

## 2016-12-23 DIAGNOSIS — M79609 Pain in unspecified limb: Secondary | ICD-10-CM

## 2016-12-23 DIAGNOSIS — Z8541 Personal history of malignant neoplasm of cervix uteri: Secondary | ICD-10-CM | POA: Insufficient documentation

## 2016-12-23 DIAGNOSIS — M7989 Other specified soft tissue disorders: Secondary | ICD-10-CM

## 2016-12-23 DIAGNOSIS — R600 Localized edema: Secondary | ICD-10-CM | POA: Insufficient documentation

## 2016-12-23 LAB — CBC
HCT: 39.3 % (ref 36.0–46.0)
HEMOGLOBIN: 12.8 g/dL (ref 12.0–15.0)
MCH: 30.9 pg (ref 26.0–34.0)
MCHC: 32.6 g/dL (ref 30.0–36.0)
MCV: 94.9 fL (ref 78.0–100.0)
Platelets: 308 10*3/uL (ref 150–400)
RBC: 4.14 MIL/uL (ref 3.87–5.11)
RDW: 13.2 % (ref 11.5–15.5)
WBC: 4.6 10*3/uL (ref 4.0–10.5)

## 2016-12-23 LAB — COMPREHENSIVE METABOLIC PANEL
ALBUMIN: 3.7 g/dL (ref 3.5–5.0)
ALK PHOS: 74 U/L (ref 38–126)
ALT: 13 U/L — ABNORMAL LOW (ref 14–54)
ANION GAP: 8 (ref 5–15)
AST: 19 U/L (ref 15–41)
BILIRUBIN TOTAL: 0.4 mg/dL (ref 0.3–1.2)
BUN: 12 mg/dL (ref 6–20)
CALCIUM: 8.8 mg/dL — AB (ref 8.9–10.3)
CO2: 25 mmol/L (ref 22–32)
Chloride: 107 mmol/L (ref 101–111)
Creatinine, Ser: 0.98 mg/dL (ref 0.44–1.00)
GFR calc non Af Amer: >60 mL/min (ref >60)
GLUCOSE: 96 mg/dL (ref 65–99)
POTASSIUM: 4.3 mmol/L (ref 3.5–5.1)
SODIUM: 140 mmol/L (ref 135–145)
TOTAL PROTEIN: 6.5 g/dL (ref 6.5–8.1)

## 2016-12-23 LAB — I-STAT TROPONIN, ED: Troponin i, poc: 0 ng/mL (ref 0.00–0.08)

## 2016-12-23 NOTE — Progress Notes (Signed)
VASCULAR LAB PRELIMINARY  PRELIMINARY  PRELIMINARY  PRELIMINARY  Right lower extremity venous duplex completed.    Preliminary report:  There is no DVT or SVT noted in the right lower extremity.  Called results to Dr. Maurie Boettcher, [facility], RVT 12/23/2016, 5:50 PM

## 2016-12-23 NOTE — Discharge Instructions (Signed)
It was our pleasure to provide your ER care today - we hope that you feel better.  Your vascular ultrasound was normal, no dvt was seen.  Your lab tests look good/normal.   Elevate legs.  Limit salt intake.    Follow up with primary care doctor in the next 1-2 weeks.  Return to ER if worse, new symptoms, high fevers, trouble breathing, other emergency.

## 2016-12-23 NOTE — ED Triage Notes (Addendum)
Pt in from home via [facility] with c/o nausea and generalized weakness since this morning. Per EMS, pt had Ramen for dinner last night, woke up today and noticed increased swelling in finger and BLE's. Pt is wearing heart monitor ordered from cardiologist. C/o some lightheadedness, alert, VSS, NAD

## 2016-12-23 NOTE — ED Provider Notes (Signed)
[name] Provider Note   CSN: [id number] Arrival date & time: 12/23/16  1623     History   Chief Complaint Chief Complaint  Patient presents with  . Nausea  . Weakness    HPI Katie Sherman is a 51 y.o. female.  Patient c/o right leg swelling in past several days. Gradual onset, constant, moderate. Worse today. No leg or calf pain. Notes minimal swelling to left foot. Hx intermittent swelling in past, but not to this extent before. Denies chest pain or discomfort. No sob. No orthopnea or pnd. No recent change in meds or diet. Urinating normal amt. No hx renal or liver disease. States felt generally weak, and nauseated earlier. No vomiting or diarrhea. No fever or chills.    The history is provided by the patient.  Weakness  Pertinent negatives include no shortness of breath, no chest pain, no vomiting, no confusion and no headaches.    Past Medical History:  Diagnosis Date  . Cervical cancer ([place])   . History of exercise stress test    ETT-Echo 5/18: normal EF, no ischemia  . Kidney stone     Patient Active Problem List   Diagnosis Date Noted  . Family history of suicide 11/20/2016  . Depression 11/20/2016  . Family history of breast cancer 11/20/2016  . Adhesive capsulitis of left shoulder 11/19/2016  . Greater trochanteric bursitis of right hip 11/19/2016    Past Surgical History:  Procedure Laterality Date  . TUBAL LIGATION      OB History    No data available       Home Medications    Prior to Admission medications   Medication Sig Start Date End Date Taking? Authorizing Provider  ibuprofen (ADVIL,MOTRIN) 600 MG tablet Take 1 tablet (600 mg total) by mouth every 6 (six) hours as needed for moderate pain or cramping (Take with food.). 11/20/16   Alfonse Spruce, FNP  ranitidine (ZANTAC) 150 MG tablet Take 1 tablet (150 mg total) by mouth 2 (two) times daily as needed for heartburn. 11/23/16   Alfonse Spruce, FNP  traMADol (ULTRAM) 50  MG tablet Take 1 tablet (50 mg total) by mouth every 8 (eight) hours as needed for severe pain. 11/06/16   Alfonse Spruce, FNP    Family History Family History  Problem Relation Age of Onset  . Heart attack Mother 58  . Heart disease Father   . Heart attack Father 64    Social History Social History  Substance Use Topics  . Smoking status: Current Every Day Smoker    Packs/day: 1.00    Years: 36.00    Types: Cigarettes    Start date: 14  . Smokeless tobacco: Never Used  . Alcohol use No     Allergies   Patient has no known allergies.   Review of Systems Review of Systems  Constitutional: Negative for chills and fever.  HENT: Negative for sore throat.   Eyes: Negative for redness.  Respiratory: Negative for cough and shortness of breath.   Cardiovascular: Negative for chest pain and palpitations.  Gastrointestinal: Negative for abdominal pain and vomiting.  Genitourinary: Negative for flank pain.  Musculoskeletal: Negative for back pain.  Skin: Negative for rash.  Neurological: Positive for weakness. Negative for headaches.  Hematological: Does not bruise/bleed easily.  Psychiatric/Behavioral: Negative for confusion.     Physical Exam Updated Vital Signs BP 104/66 (BP Location: Right Arm)   Pulse 67   Temp 97.9 F (36.6 C) (Oral)  Resp 16   SpO2 95%   Physical Exam  Constitutional: She appears well-developed and well-nourished. No distress.  HENT:  Mouth/Throat: Oropharynx is clear and moist.  Eyes: Conjunctivae are normal. No scleral icterus.  Neck: Neck supple. No tracheal deviation present. No thyromegaly present.  Cardiovascular: Normal rate, regular rhythm, normal heart sounds and intact distal pulses.  Exam reveals no gallop and no friction rub.   No murmur heard. Pulmonary/Chest: Effort normal and breath sounds normal. No respiratory distress.  Abdominal: Soft. Normal appearance and bowel sounds are normal. She exhibits no distension. There  is no tenderness.  Musculoskeletal:  Moderate swelling right foot, extends to lower leg. Mild swelling left foot. Distal pulses palp bil. No calf pain or tenderness.   Neurological: She is alert.  Skin: Skin is warm and dry. No rash noted. She is not diaphoretic.  No skin changes to feet/legs. Normal color and warmth.   Psychiatric: She has a normal mood and affect.  Nursing note and vitals reviewed.    ED Treatments / Results  Labs (all labs ordered are listed, but only abnormal results are displayed) Results for orders placed or performed during the hospital encounter of 12/23/16  CBC  Result Value Ref Range   WBC 4.6 4.0 - 10.5 K/uL   RBC 4.14 3.87 - 5.11 MIL/uL   Hemoglobin 12.8 12.0 - 15.0 g/dL   HCT 39.3 36.0 - 46.0 %   MCV 94.9 78.0 - 100.0 fL   MCH 30.9 26.0 - 34.0 pg   MCHC 32.6 30.0 - 36.0 g/dL   RDW 13.2 11.5 - 15.5 %   Platelets 308 150 - 400 K/uL  Comprehensive metabolic panel  Result Value Ref Range   Sodium 140 135 - 145 mmol/L   Potassium 4.3 3.5 - 5.1 mmol/L   Chloride 107 101 - 111 mmol/L   CO2 25 22 - 32 mmol/L   Glucose, Bld 96 65 - 99 mg/dL   BUN 12 6 - 20 mg/dL   Creatinine, Ser 0.98 0.44 - 1.00 mg/dL   Calcium 8.8 (L) 8.9 - 10.3 mg/dL   Total Protein 6.5 6.5 - 8.1 g/dL   Albumin 3.7 3.5 - 5.0 g/dL   AST 19 15 - 41 U/L   ALT 13 (L) 14 - 54 U/L   Alkaline Phosphatase 74 38 - 126 U/L   Total Bilirubin 0.4 0.3 - 1.2 mg/dL   GFR calc non Af Amer >60 >60 mL/min   GFR calc Af Amer >60 >60 mL/min   Anion gap 8 5 - 15  I-stat troponin, ED  Result Value Ref Range   Troponin i, poc 0.00 0.00 - 0.08 ng/mL   Comment 3            EKG  EKG Interpretation None       Radiology No results found.  Procedures Procedures (including critical care time)  Medications Ordered in ED Medications - No data to display   Initial Impression / Assessment and Plan / ED Course  I have reviewed the triage vital signs and the nursing notes.  Pertinent labs &  imaging results that were available during my care of the patient were reviewed by me and considered in my medical decision making (see chart for details).  Labs sent. Vascular u/s.   Vascular doppler neg for dvt - see report:  Progress Notes Date of Service: 12/23/2016 5:49 PM Iantha Fallen, RVS  Vascular Lab    [] Hide copied text [] Hover for attribution  information VASCULAR LAB PRELIMINARY  PRELIMINARY  PRELIMINARY  PRELIMINARY  Right lower extremity venous duplex completed.    Preliminary report:  There is no DVT or SVT noted in the right lower extremity.  Called results to Dr. Maurie Boettcher, [place], RVT 12/23/2016, 5:50 PM     rec elevation legs, limit salt intake.  Labs unremarkable.   Patient currently appears stable for d/c.         Final Clinical Impressions(s) / ED Diagnoses   Final diagnoses:  None    New Prescriptions New Prescriptions   No medications on file     Lajean Saver, MD 12/23/16 [phone]

## 2016-12-29 ENCOUNTER — Inpatient Hospital Stay (HOSPITAL_COMMUNITY)
Admission: AD | Admit: 2016-12-29 | Discharge: 2017-01-04 | DRG: 885 | Disposition: A | Discharge status: Home / Self Care | Payer: No Typology Code available for payment source | Source: Intra-hospital | Attending: Psychiatry | Admitting: Psychiatry

## 2016-12-29 ENCOUNTER — Encounter (HOSPITAL_COMMUNITY): Payer: Self-pay | Admitting: Emergency Medicine

## 2016-12-29 ENCOUNTER — Emergency Department (HOSPITAL_COMMUNITY)
Admission: EM | Admit: 2016-12-29 | Discharge: 2016-12-29 | Disposition: A | Discharge status: Other Acute Inpatient Hospital | Payer: No Typology Code available for payment source | Attending: Emergency Medicine | Admitting: Emergency Medicine

## 2016-12-29 DIAGNOSIS — F319 Bipolar disorder, unspecified: Principal | ICD-10-CM | POA: Diagnosis present

## 2016-12-29 DIAGNOSIS — Z8541 Personal history of malignant neoplasm of cervix uteri: Secondary | ICD-10-CM

## 2016-12-29 DIAGNOSIS — F329 Major depressive disorder, single episode, unspecified: Secondary | ICD-10-CM | POA: Insufficient documentation

## 2016-12-29 DIAGNOSIS — K219 Gastro-esophageal reflux disease without esophagitis: Secondary | ICD-10-CM

## 2016-12-29 DIAGNOSIS — Z915 Personal history of self-harm: Secondary | ICD-10-CM

## 2016-12-29 DIAGNOSIS — R45851 Suicidal ideations: Secondary | ICD-10-CM

## 2016-12-29 DIAGNOSIS — Z803 Family history of malignant neoplasm of breast: Secondary | ICD-10-CM

## 2016-12-29 DIAGNOSIS — F17210 Nicotine dependence, cigarettes, uncomplicated: Secondary | ICD-10-CM | POA: Diagnosis present

## 2016-12-29 DIAGNOSIS — E785 Hyperlipidemia, unspecified: Secondary | ICD-10-CM | POA: Diagnosis present

## 2016-12-29 DIAGNOSIS — F1290 Cannabis use, unspecified, uncomplicated: Secondary | ICD-10-CM | POA: Diagnosis present

## 2016-12-29 DIAGNOSIS — Z79899 Other long term (current) drug therapy: Secondary | ICD-10-CM | POA: Insufficient documentation

## 2016-12-29 DIAGNOSIS — G4700 Insomnia, unspecified: Secondary | ICD-10-CM | POA: Diagnosis present

## 2016-12-29 DIAGNOSIS — Z590 Homelessness: Secondary | ICD-10-CM

## 2016-12-29 DIAGNOSIS — F4310 Post-traumatic stress disorder, unspecified: Secondary | ICD-10-CM | POA: Diagnosis present

## 2016-12-29 DIAGNOSIS — Z791 Long term (current) use of non-steroidal anti-inflammatories (NSAID): Secondary | ICD-10-CM

## 2016-12-29 DIAGNOSIS — Z818 Family history of other mental and behavioral disorders: Secondary | ICD-10-CM

## 2016-12-29 HISTORY — DX: Other specified postprocedural states: Z98.890

## 2016-12-29 LAB — COMPREHENSIVE METABOLIC PANEL
ALK PHOS: 76 U/L (ref 38–126)
ALT: 13 U/L — AB (ref 14–54)
AST: 18 U/L (ref 15–41)
Albumin: 4.8 g/dL (ref 3.5–5.0)
Anion gap: 6 (ref 5–15)
BUN: 17 mg/dL (ref 6–20)
CALCIUM: 9 mg/dL (ref 8.9–10.3)
CO2: 27 mmol/L (ref 22–32)
CREATININE: 0.93 mg/dL (ref 0.44–1.00)
Chloride: 108 mmol/L (ref 101–111)
Glucose, Bld: 94 mg/dL (ref 65–99)
Potassium: 3.6 mmol/L (ref 3.5–5.1)
SODIUM: 141 mmol/L (ref 135–145)
TOTAL PROTEIN: 7.8 g/dL (ref 6.5–8.1)
Total Bilirubin: 0.8 mg/dL (ref 0.3–1.2)

## 2016-12-29 LAB — CBC
HCT: 39.8 % (ref 36.0–46.0)
HEMOGLOBIN: 13.6 g/dL (ref 12.0–15.0)
MCH: 31.6 pg (ref 26.0–34.0)
MCHC: 34.2 g/dL (ref 30.0–36.0)
MCV: 92.3 fL (ref 78.0–100.0)
PLATELETS: 331 10*3/uL (ref 150–400)
RBC: 4.31 MIL/uL (ref 3.87–5.11)
RDW: 13 % (ref 11.5–15.5)
WBC: 8.1 10*3/uL (ref 4.0–10.5)

## 2016-12-29 LAB — SALICYLATE LEVEL

## 2016-12-29 LAB — RAPID URINE DRUG SCREEN, HOSP PERFORMED
Amphetamines: NOT DETECTED
Barbiturates: NOT DETECTED
Benzodiazepines: NOT DETECTED
COCAINE: NOT DETECTED
OPIATES: NOT DETECTED
TETRAHYDROCANNABINOL: POSITIVE — AB

## 2016-12-29 LAB — ACETAMINOPHEN LEVEL

## 2016-12-29 LAB — ETHANOL

## 2016-12-29 MED ORDER — MAGNESIUM HYDROXIDE 400 MG/5ML PO SUSP: 30.0000 mL | Freq: Every day | ORAL | Status: DC | PRN

## 2016-12-29 MED ORDER — NAPROXEN SODIUM 550 MG PO TABS
550.0000 mg | ORAL_TABLET | Freq: Two times a day (BID) | ORAL | Status: DC | PRN
  Filled 2016-12-29: qty 1

## 2016-12-29 MED ORDER — TRAZODONE HCL 50 MG PO TABS
50.0000 mg | ORAL_TABLET | Freq: Every evening | ORAL | Status: DC | PRN
  Administered 2016-12-30 – 2017-01-01 (×4): 50 mg via ORAL
  Filled 2016-12-29 (×4): qty 1

## 2016-12-29 MED ORDER — ALUM & MAG HYDROXIDE-SIMETH 200-200-20 MG/5ML PO SUSP: 30.0000 mL | ORAL | Status: DC | PRN

## 2016-12-29 MED ORDER — FAMOTIDINE 20 MG PO TABS
20.0000 mg | ORAL_TABLET | Freq: Every day | ORAL | Status: DC
  Administered 2016-12-30 – 2017-01-04 (×6): 20 mg via ORAL
  Filled 2016-12-29 (×8): qty 1

## 2016-12-29 MED ORDER — ACETAMINOPHEN 325 MG PO TABS: 650.0000 mg | ORAL_TABLET | Freq: Four times a day (QID) | ORAL | Status: DC | PRN

## 2016-12-29 MED ORDER — HYDROXYZINE HCL 25 MG PO TABS
25.0000 mg | ORAL_TABLET | Freq: Three times a day (TID) | ORAL | Status: DC | PRN
  Administered 2016-12-30 – 2017-01-01 (×6): 25 mg via ORAL
  Filled 2016-12-29 (×5): qty 1

## 2016-12-29 NOTE — ED Notes (Signed)
Security at bedside

## 2016-12-29 NOTE — Progress Notes (Signed)
Pt accepted to [facility] bed 405-2

## 2016-12-29 NOTE — ED Triage Notes (Signed)
[employer] with pt for SI. Pt is voluntary, she was picked up from [facility] speaking with a counselor about concerns for SI. Pt denies a plan but states that she would go to her fathers home and get hs gun. Pt states that tomorrow marks the 1 year anniversary of her brother blowing his brains out. Pt was tearful during discussion. NAD at triage. Alert and ambulatory with quick and steady gait.

## 2016-12-29 NOTE — ED Notes (Signed)
Received call from [facility] requesting to transport pt @ 2230 in lieu of 2130.
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
## 2016-12-29 NOTE — ED Notes (Signed)
Pt refused to remove earrings stating "my daughter put these in and I haven't taken them out since.  I don't want to go over there and talk to somebody @ 2200 tonight.  What have I got myself into?"

## 2016-12-29 NOTE — ED Triage Notes (Signed)
Pt now states she was dx with bipolar, depression (manic),  severe anxiety and PTSD. States she normally just goes to the mental health place they give her meds she takes them and then trashes them when she feels better. None of the dx are listed in her Medical hx. States she was 51 yo when dx.

## 2016-12-29 NOTE — ED Notes (Signed)
TTS at bedside. 

## 2016-12-29 NOTE — ED Notes (Signed)
[facility] declined patient due to telemetry monitor on patients chest.

## 2016-12-29 NOTE — ED Notes (Signed)
ED Provider at bedside. 

## 2016-12-29 NOTE — ED Notes (Signed)
Patient changed out to be wanded by security

## 2016-12-29 NOTE — ED Notes (Signed)
Voluntary Admission Consent has been faxed to [facility]  Accepting MD. Parke Poisson and Tomasita Morrow.

## 2016-12-29 NOTE — ED Provider Notes (Signed)
[facility] DEPT Provider Note   CSN: [id number] Arrival date & time: 12/29/16  1444     History   Chief Complaint Chief Complaint  Patient presents with  . Suicidal    HPI Katie Sherman is a 51 y.o. female.  Patient is a 51 year old female with a history of bipolar disorder who presents with depression and suicidal ideations. She states she's had worsening depression over the last few weeks. Lately she's been having thoughts of going over to her ex-husband's house and getting his gun and shooting herself in the bathroom. Both her husband and her brother have committed suicide in the past. She denies any hallucinations. She denies any current medical issues. She does have a history of bradycardia and some near syncopal events which are currently being evaluated by her primary care provider and cardiologist. She has a loop recorder in place. However she denies any symptoms. She's had some ongoing swelling to her right leg as compared to her left and has had a recent Doppler ultrasound which was negative for DVT.      Past Medical History:  Diagnosis Date  . Cervical cancer ([place])   . Cervical cancer ([place])   . History of exercise stress test    ETT-Echo 5/18: normal EF, no ischemia  . History of loop recorder    currently on person  . Kidney stone     Patient Active Problem List   Diagnosis Date Noted  . Family history of suicide 11/20/2016  . Depression 11/20/2016  . Family history of breast cancer 11/20/2016  . Adhesive capsulitis of left shoulder 11/19/2016  . Greater trochanteric bursitis of right hip 11/19/2016    Past Surgical History:  Procedure Laterality Date  . CERVICAL CONE BIOPSY    . TUBAL LIGATION      OB History    No data available       Home Medications    Prior to Admission medications   Medication Sig Start Date End Date Taking? Authorizing Provider  ibuprofen (ADVIL,MOTRIN) 600 MG tablet Take 1 tablet (600 mg total) by mouth every 6  (six) hours as needed for moderate pain or cramping (Take with food.). 11/20/16  Yes Hairston, Maylon Peppers, FNP  naproxen sodium (ANAPROX) 220 MG tablet Take 220-440 mg by mouth 2 (two) times daily with a meal.   Yes [provider]  ranitidine (ZANTAC) 150 MG tablet Take 1 tablet (150 mg total) by mouth 2 (two) times daily as needed for heartburn. 11/23/16  Yes Hairston, Maylon Peppers, FNP  traMADol (ULTRAM) 50 MG tablet Take 1 tablet (50 mg total) by mouth every 8 (eight) hours as needed for severe pain. Patient not taking: Reported on 12/29/2016 11/06/16   Alfonse Spruce, FNP    Family History Family History  Problem Relation Age of Onset  . Heart attack Mother 44  . Heart disease Father   . Heart attack Father 21    Social History Social History  Substance Use Topics  . Smoking status: Current Every Day Smoker    Packs/day: 1.00    Years: 36.00    Types: Cigarettes    Start date: 46  . Smokeless tobacco: Never Used  . Alcohol use No     Allergies   Patient has no known allergies.   Review of Systems Review of Systems  Constitutional: Negative for chills, diaphoresis, fatigue and fever.  HENT: Negative for congestion, rhinorrhea and sneezing.   Eyes: Negative.   Respiratory: Negative for cough, chest  tightness and shortness of breath.   Cardiovascular: Positive for leg swelling. Negative for chest pain.  Gastrointestinal: Negative for abdominal pain, blood in stool, diarrhea, nausea and vomiting.  Genitourinary: Negative for difficulty urinating, flank pain, frequency and hematuria.  Musculoskeletal: Negative for arthralgias and back pain.  Skin: Negative for rash.  Neurological: Negative for dizziness, speech difficulty, weakness, numbness and headaches.  Psychiatric/Behavioral: Positive for suicidal ideas.     Physical Exam Updated Vital Signs BP 123/70 (BP Location: Right Arm)   Pulse 67   Temp 98.2 F (36.8 C) (Oral)   Resp 18   Ht 5\' 7"  (1.702  m)   Wt 81.6 kg (180 lb)   SpO2 100%   BMI 28.19 kg/m   Physical Exam  Constitutional: She is oriented to person, place, and time. She appears well-developed and well-nourished.  HENT:  Head: Normocephalic and atraumatic.  Eyes: Pupils are equal, round, and reactive to light.  Neck: Normal range of motion. Neck supple.  Cardiovascular: Normal rate, regular rhythm and normal heart sounds.   Pulmonary/Chest: Effort normal and breath sounds normal. No respiratory distress. She has no wheezes. She has no rales. She exhibits no tenderness.  Abdominal: Soft. Bowel sounds are normal. There is no tenderness. There is no rebound and no guarding.  Musculoskeletal: Normal range of motion. She exhibits no edema.  Trace edema to the right lower extremity, no calf tenderness  Lymphadenopathy:    She has no cervical adenopathy.  Neurological: She is alert and oriented to person, place, and time.  Skin: Skin is warm and dry. No rash noted.  Psychiatric: She has a normal mood and affect.     ED Treatments / Results  Labs (all labs ordered are listed, but only abnormal results are displayed) Labs Reviewed  COMPREHENSIVE METABOLIC PANEL - Abnormal; Notable for the following:       Result Value   ALT 13 (*)    All other components within normal limits  ACETAMINOPHEN LEVEL - Abnormal; Notable for the following:    Acetaminophen (Tylenol), Serum <10 (*)    All other components within normal limits  RAPID URINE DRUG SCREEN, HOSP PERFORMED - Abnormal; Notable for the following:    Tetrahydrocannabinol POSITIVE (*)    All other components within normal limits  ETHANOL  SALICYLATE LEVEL  CBC    EKG  EKG Interpretation None       Radiology No results found.  Procedures Procedures (including critical care time)  Medications Ordered in ED Medications - No data to display   Initial Impression / Assessment and Plan / ED Course  I have reviewed the triage vital signs and the nursing  notes.  Pertinent labs & imaging results that were available during my care of the patient were reviewed by me and considered in my medical decision making (see chart for details).    Patient is medically cleared and awaiting TTS evaluation  Final Clinical Impressions(s) / ED Diagnoses   Final diagnoses:  Suicidal thoughts    New Prescriptions New Prescriptions   No medications on file     Malvin Johns, MD 12/29/16 1702

## 2016-12-30 ENCOUNTER — Encounter (HOSPITAL_COMMUNITY): Payer: Self-pay

## 2016-12-30 DIAGNOSIS — F319 Bipolar disorder, unspecified: Principal | ICD-10-CM

## 2016-12-30 DIAGNOSIS — F17210 Nicotine dependence, cigarettes, uncomplicated: Secondary | ICD-10-CM

## 2016-12-30 DIAGNOSIS — R45851 Suicidal ideations: Secondary | ICD-10-CM

## 2016-12-30 LAB — LIPID PANEL
Cholesterol: 219 mg/dL — ABNORMAL HIGH (ref 0–200)
HDL: 42 mg/dL (ref >40)
LDL CALC: 143 mg/dL — AB (ref 0–99)
Total CHOL/HDL Ratio: 5.2 RATIO
Triglycerides: 171 mg/dL — ABNORMAL HIGH (ref <150)
VLDL: 34 mg/dL (ref 0–40)

## 2016-12-30 MED ORDER — NICOTINE 14 MG/24HR TD PT24
14.0000 mg | MEDICATED_PATCH | Freq: Every day | TRANSDERMAL | Status: DC
  Administered 2016-12-30 – 2017-01-04 (×6): 14 mg via TRANSDERMAL
  Filled 2016-12-30 (×7): qty 1

## 2016-12-30 MED ORDER — NICOTINE 14 MG/24HR TD PT24
MEDICATED_PATCH | TRANSDERMAL | Status: AC
  Administered 2016-12-30: 14 mg
  Filled 2016-12-30: qty 1

## 2016-12-30 MED ORDER — OMEGA-3-ACID ETHYL ESTERS 1 G PO CAPS
1.0000 g | ORAL_CAPSULE | Freq: Two times a day (BID) | ORAL | Status: DC
  Administered 2016-12-30 – 2017-01-04 (×10): 1 g via ORAL
  Filled 2016-12-30: qty 1
  Filled 2016-12-30: qty 14
  Filled 2016-12-30 (×8): qty 1
  Filled 2016-12-30 (×2): qty 14
  Filled 2016-12-30 (×4): qty 1
  Filled 2016-12-30: qty 14

## 2016-12-30 MED ORDER — NAPROXEN 500 MG PO TABS
500.0000 mg | ORAL_TABLET | Freq: Two times a day (BID) | ORAL | Status: DC | PRN
  Administered 2016-12-30 – 2017-01-03 (×8): 500 mg via ORAL
  Filled 2016-12-30 (×8): qty 1

## 2016-12-30 MED ORDER — PRAVASTATIN SODIUM 20 MG PO TABS
20.0000 mg | ORAL_TABLET | Freq: Every day | ORAL | Status: DC
  Administered 2016-12-30 – 2017-01-03 (×5): 20 mg via ORAL
  Filled 2016-12-30 (×7): qty 1

## 2016-12-30 MED ORDER — ARIPIPRAZOLE 5 MG PO TABS
5.0000 mg | ORAL_TABLET | Freq: Every day | ORAL | Status: DC
  Administered 2016-12-31: 5 mg via ORAL
  Filled 2016-12-30 (×3): qty 1

## 2016-12-30 NOTE — Progress Notes (Signed)
Katie Sherman is a 51 year old female being admitted voluntarily to [facility] from [facility].  She came to the ED for suicidal ideation while at her appointment at [facility].  She has a history of bipolar disorder and noncompliance with her psychiatric treatment.   She is reporting uncontrollable crying and focused on the anniversary of her brother's suicide a year ago today and also reported that her husband killed himself in 2010.  She has been homeless for about a year, living with relative and friends. She denied HI or A/V hallucinations.  She is diagnosed with Bipolar I Disorder, most recent depressed.  She was very agitated and argumentative during [facility] admission.  She was demanding to be taken outside so she could smoke a cigarette and offered patch/gum in which she refused.  She just kept saying over and over that "I just want to leave, this is like prison."  She was verbally aggressive towards staff because some of her belongings had to be locked in locker.  Multiple attempts to explain the rules to her were made but she would just turn her head and not answer questions. She is currently wearing a loop monitor for cardiac evaluation. She did report that she needed her charger for her heart monitor and the adhesive monitor strips with her on the unit but RN placed them in paper back and given to accepting RN to place in the med room until she needed them.  She denied SI/HI or A/V hallucinations.  Oriented her to the unit.  Admission paperwork completed and signed with much explanation.  Belongings searched and secured in locker # 29.  Skin assessment completed and noted multiple tattoos.  Q 15 minute checks initiated for safety.  We will monitor the progress towards her goals.

## 2016-12-30 NOTE — [facility] Suicide Risk Assessment (Signed)
[facility] [place] INPATIENT:  Family/Significant Other Suicide Prevention Education  Suicide Prevention Education:  Patient Refusal for Family/Significant Other Suicide Prevention Education: The patient Katie Sherman has refused to provide written consent for family/significant other to be provided Family/Significant Other Suicide Prevention Education during admission and/or prior to discharge.  Physician notified.  Berlin Hun Grossman-Orr 12/30/2016, 3:13 PM
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
## 2016-12-30 NOTE — BHH Suicide Risk Assessment (Signed)
[facility] Admission Suicide Risk Assessment   Nursing information obtained from:  Patient Demographic factors:  Caucasian, Unemployed, Living alone Current Mental Status:  Suicidal ideation indicated by patient Loss Factors:  Financial problems / change in socioeconomic status, Decline in physical health Historical Factors:  Family history of suicide, Family history of mental illness or substance abuse, Anniversary of important loss Risk Reduction Factors:  NA  Total Time spent with patient: 45 minutes Principal Problem: Bipolar Disorder Diagnosis:   Patient Active Problem List   Diagnosis Date Noted  . Bipolar 1 disorder, depressed ([place]) [F31.9] 12/29/2016  . Family history of suicide [Z81.8] 11/20/2016  . Depression [F32.9] 11/20/2016  . Family history of breast cancer [Z80.3] 11/20/2016  . Adhesive capsulitis of left shoulder [M75.02] 11/19/2016  . Greater trochanteric bursitis of right hip [M70.61] 11/19/2016   Subjective Data:  51 yo widow, homeless, unemployed. Background history of early life trauma, Bipolar Disorder and SUD. Referred to the ER by her PCP.  She has been having strong suicidal thoughts. She has been ruminating about the loss of her brother. Today is her brother's anniversary. She has a strong family history of suicide. Her brother and her husband committed suicide. UDS is positive for THC.  Patient reports other stressors. Says she has a lot of medical issues. She has no income and no place to lay her head. Says she is very easily irritated. She is yelling at people over trivial things. Her mind races. She has not been able to settle into sleep at night. Says she has been seriously thinking of shooting herself. Does not have access to weapons at this time. Has a past history of attempting suicide. Patient has never been treated with a mood stabilizer. Says she has been given antidepressants in the past. Does not stay on them as they make her more irritable. She has grown  children and grandchildren. Limited support from them. We discussed use of Abilify as a mood stabilizer. We discussed the risks and benefits. She has consented to treatment.  Continued Clinical Symptoms:  Alcohol Use Disorder Identification Test Final Score (AUDIT): 0 The "Alcohol Use Disorders Identification Test", Guidelines for Use in Primary Care, Second Edition.  World Pharmacologist [facility]). Score between 0-7:  no or low risk or alcohol related problems. Score between 8-15:  moderate risk of alcohol related problems. Score between 16-19:  high risk of alcohol related problems. Score 20 or above:  warrants further diagnostic evaluation for alcohol dependence and treatment.   CLINICAL FACTORS:  Mood swings Homelessness Lack of income Limited support.   Musculoskeletal: Strength & Muscle Tone: within normal limits Gait & Station: normal Patient leans: N/A  Psychiatric Specialty Exam: Physical Exam  Constitutional: She is oriented to person, place, and time. She appears well-developed and well-nourished.  HENT:  Head: Normocephalic and atraumatic.  Eyes: Pupils are equal, round, and reactive to light.  Neck: Normal range of motion. Neck supple.  Cardiovascular: Normal rate.   Respiratory: Effort normal.  Musculoskeletal: Normal range of motion.  Neurological: She is alert and oriented to person, place, and time.  Skin: Skin is warm and dry.  Psychiatric:  As above    ROS  Blood pressure 103/64, pulse 64, temperature 97.9 F (36.6 C), temperature source Oral, resp. rate 18, height 5\' 5"  (1.651 m), weight 80.7 kg (178 lb).Body mass index is 29.62 kg/m.  General Appearance: In bed, moderate rapport. Underlying irritability.   Eye Contact:  Good  Speech:  Not pressured or loud  Volume:  Normal  Mood:  Depressed and Irritable  Affect:  Congruent  Thought Process:  Linear  Orientation:  Full (Time, Place, and Person)  Thought Content:  Rumination, hopelessness and  worthlessness.   Suicidal Thoughts:  Yes.  with intent/plan  Homicidal Thoughts:  No  Memory:  Immediate;   Good Recent;   Good Remote;   Good  Judgement:  Impaired  Insight:  Good  Psychomotor Activity:  Normal  Concentration:  Concentration: Fair and Attention Span: Fair  Recall:  Good  Fund of Knowledge:  Good  Language:  Good  Akathisia:  NA  Handed:    AIMS (if indicated):     Assets:  Communication Skills Desire for Improvement  ADL's:  Intact  Cognition:  WNL  Sleep:  Number of Hours: 6      COGNITIVE FEATURES THAT CONTRIBUTE TO RISK:  Closed-mindedness    SUICIDE RISK:   Severe:  Frequent, intense, and enduring suicidal ideation, specific plan, no subjective intent, but some objective markers of intent (i.e., choice of lethal method), the method is accessible, some limited preparatory behavior, evidence of impaired self-control, severe dysphoria/symptomatology, multiple risk factors present, and few if any protective factors, particularly a lack of social support.  PLAN OF CARE:  1. Suicide precautions 2. Abilify 5 mg daily  I certify that inpatient services furnished can reasonably be expected to improve the patient's condition.   Artist Beach, MD 12/30/2016, 4:34 PM

## 2016-12-30 NOTE — Tx Team (Signed)
Initial Treatment Plan 12/30/2016 1:00 AM [name] DQQ:[id number]    PATIENT STRESSORS: Financial difficulties Loss of brother to suicide a year ago Medication change or noncompliance Traumatic event   PATIENT STRENGTHS: Curator fund of knowledge   PATIENT IDENTIFIED PROBLEMS: Depression  Suicidal ideation  "Meds probably"  "Continued treatment when I leave"               DISCHARGE CRITERIA:  Improved stabilization in mood, thinking, and/or behavior Motivation to continue treatment in a less acute level of care Verbal commitment to aftercare and medication compliance  PRELIMINARY DISCHARGE PLAN: Outpatient therapy Medication management  PATIENT/FAMILY INVOLVEMENT: This treatment plan has been presented to and reviewed with the patient, Margy Sumler.  The patient and family have been given the opportunity to ask questions and make suggestions.  Windell Moment, RN 12/30/2016, 1:00 AM

## 2016-12-30 NOTE — Progress Notes (Signed)
Adult Psychoeducational Group Note  Date:  12/30/2016 Time:  10:00 am  Group Topic/Focus:  Identifying Needs:   The focus of this group is to help patients identify their personal needs that have been historically problematic and identify healthy behaviors to address their needs.  Participation Level:  Did Not Attend  Participation Quality:    Affect:    Cognitive:    Insight:   Engagement in Group:    Modes of Intervention:    Additional Comments:    Zarya Lasseigne L 12/30/2016, 10:00 am

## 2016-12-30 NOTE — Progress Notes (Signed)
D  Lorrain spent much of her day sleeping in her room. A When she got up, she was irritable and wanting to go home...upset when she spoke to the physician and found out she would not be discharged today. She contracted for safety, rated her depression, hopelessness and anxiety "2/4/7", respectively. She shared that she's always been able to " handle" her bipolar, taking medications " off and on " throughout her life. She says her brother committed suicided  " 5 years ago" and that she  just last year was beginning to come to grips with his loss ./...Marland Kitchenand then her husband suicided and she feels very traumatized , hurt, abandond, upset, etc... I(n addition, she has just a few months ago received a cardiac sensor that is able to detect cardiac arrythmia ( the sensor will alarm if it detects irregular heartbeats) and she is stressed out and scred. She does go into the dayroom and spent a significant amt of time speaking with this Probation officer and is seen OOB UAL on the 400 hall tolerated well. R Safety in place.
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
## 2016-12-30 NOTE — BHH Group Notes (Signed)
[place] Group Notes: (Clinical Social Work)   12/30/2016      Type of Therapy:  Group Therapy   Participation Level:  Did Not Attend despite MHT prompting   Selmer Dominion, LCSW 12/30/2016, 12:42 PM

## 2016-12-31 ENCOUNTER — Encounter (HOSPITAL_COMMUNITY): Payer: Self-pay | Admitting: Behavioral Health

## 2016-12-31 DIAGNOSIS — E785 Hyperlipidemia, unspecified: Secondary | ICD-10-CM

## 2016-12-31 DIAGNOSIS — F1290 Cannabis use, unspecified, uncomplicated: Secondary | ICD-10-CM

## 2016-12-31 DIAGNOSIS — G4700 Insomnia, unspecified: Secondary | ICD-10-CM

## 2016-12-31 DIAGNOSIS — F419 Anxiety disorder, unspecified: Secondary | ICD-10-CM

## 2016-12-31 LAB — HEMOGLOBIN A1C
HEMOGLOBIN A1C: 5.3 % (ref 4.8–5.6)
MEAN PLASMA GLUCOSE: 105 mg/dL

## 2016-12-31 NOTE — Progress Notes (Signed)
Pt stated the Trazodone may be too much . Pt may want to decrease to 25 mg or not take the Vistaril with the Trazodone, pt encouraged to discuss with the doctor

## 2016-12-31 NOTE — BHH Group Notes (Signed)
[place] Group Notes:  (Nursing/MHT/Case Management/Adjunct)  Date:  12/31/2016  Time:  3:32 PM  Type of Therapy:  Nurse Education  Participation Level:  Did Not Attend   Summary of Progress/Problems:   Each patient was tasked with witting positive attributes about the other patients. Discussion followed about how positive statements can help self esteem  Cheri Kearns 12/31/2016, 3:32 PM

## 2016-12-31 NOTE — Progress Notes (Addendum)
Patient does not wish to take abilify going forward. Reports it has "knocked me out and I've slept all day. I need something to give me energy, not make me tired." Encouraged to speak with MD in the AM. Patient verbalizes understanding.   Add note: Patient made high fall risk due to recent near syncopal episodes. Patient states she knows when these events occur but does not have control of her body when they do. Patient verbalizes understanding of fall prevention plan. (unit is out of fall risk arm bands)

## 2016-12-31 NOTE — Progress Notes (Addendum)
Patient ID: Katie Sherman, female   DOB: 1966-01-25, 51 y.o.   MRN: [id number]  Pt currently presents with a flat affect and anxious, impulsive behavior. Pt blames others, mood is irritable. Interaction from superficial. Reports increased anxiety. Pt main complaint is shoulder and hip pain. Pt reports good sleep with current medication regimen.  Pt provided with medications per providers orders. Pt's labs and vitals were monitored throughout the night. Pt given a 1:1 about emotional and mental status. Pt supported and encouraged to express concerns and questions. Pt educated on medications and indigestion. Given earplugs tonight.   Pt's safety ensured with 15 minute and environmental checks. Pt initially reports she cannot contract for safety, denies SI or ways that she would hurt herself at [facility]. Post discussion of her comment, pt states "No, I can come tell you guys." Pt currently denies SI, HI and A/V hallucinations. Pt verbally agrees to seek staff if SI/HI or A/VH occurs and to consult with staff before acting on any harmful thoughts. Will continue POC.    Pt reports to writer that she will refuse her Abilify in the morning because she feels it "sedates me." Previous nurse reported that patient was actually more awake today than the day before. Pt encouraged to discuss POC with MD tomorrow morning.

## 2016-12-31 NOTE — Progress Notes (Signed)
D: Pt denies SI/HI/AVH. Pt is pleasant and cooperative. Pt stated she was having an issue with her husband who committed suicide 7.5 yrs ago, then her mother dying 2 yrs after that and yesterday was the anniversary of her brother committing suicide. Pt stated she was doing better and feeling ok being here, but have been having a hard time dealing with her loss of her family members.   A:   Extensive 1:1 time spent . Pt was offered support and encouragement. Pt was given scheduled medications. Pt was encourage to attend groups. Q 15 minute checks were done for safety.   R:Pt attends groups and interacts well with peers and staff. Pt is taking medication. Pt has no complaints at this time .Pt receptive to treatment and safety maintained on unit.

## 2016-12-31 NOTE — Plan of Care (Signed)
Problem: Safety: Goal: Periods of time without injury will increase Outcome: Progressing Patient has not engaged in self harm, denies SI. Verbal contract for safety in place.  Problem: Medication: Goal: Compliance with prescribed medication regimen will improve Outcome: Progressing Patient has been and remains med compliant.

## 2016-12-31 NOTE — BHH Group Notes (Signed)
[place] Group Notes: (Clinical Social Work)   12/31/2016      Type of Therapy:  Group Therapy   Participation Level:  Did Not Attend despite MHT prompting   Selmer Dominion, LCSW 12/31/2016, 12:18 PM

## 2016-12-31 NOTE — Progress Notes (Signed)
[facility] MD Progress Note  12/31/2016 11:43 AM Katie Sherman  MRN:  [id number]  Subjective:  " I slept all day yesterday because of my depression. Other than that, I am fine."  Objective: Face to face evaluation completed and chart reviewed. Patient .admitted to [facility] for suicidal ideation. She has a history of Bipolar, MDD, GAD and PTSD as well as a history of being non complaint with past medications. During this evaluation, patient is alert and oriented x3, calm, and cooperative. She continues to endorse symptoms of depression and anxiety although there are no physical signs of anxiety observed. She denies current SI, HI, or self-harming urges. She denies AVH and does not appear internally preoccupied. No behavioral issues have been reported or observed while on the unit. She endorses good appetite and sleeping pattern. Patient was started on Abilify for mood stabilization and appears to be tolerating the medication well thus far without reported side effects. At this time, patient is able to contract for safety on the unit.     Principal Problem: Bipolar 1 disorder, depressed ([place]) Diagnosis:   Patient Active Problem List   Diagnosis Date Noted  . Bipolar 1 disorder, depressed ([place]) [F31.9] 12/29/2016  . Family history of suicide [Z81.8] 11/20/2016  . Depression [F32.9] 11/20/2016  . Family history of breast cancer [Z80.3] 11/20/2016  . Adhesive capsulitis of left shoulder [M75.02] 11/19/2016  . Greater trochanteric bursitis of right hip [M70.61] 11/19/2016   Total Time spent with patient: 30 minutes  Past Psychiatric History: Bipolar, MDD, GAD and PTSD  Past Medical History:  Past Medical History:  Diagnosis Date  . Cervical cancer ([place])   . Cervical cancer ([place])   . History of exercise stress test    ETT-Echo 5/18: normal EF, no ischemia  . History of loop recorder    currently on person  . Kidney stone     Past Surgical History:  Procedure Laterality Date  . CERVICAL CONE  BIOPSY    . TUBAL LIGATION     Family History:  Family History  Problem Relation Age of Onset  . Heart attack Mother 44  . Heart disease Father   . Heart attack Father 86   Family Psychiatric  History:  Brother committed suicide  Social History:  History  Alcohol Use No     History  Drug Use  . Types: Marijuana    Social History   Social History  . Marital status: Widowed    Spouse name: N/A  . Number of children: N/A  . Years of education: N/A   Occupational History  . unemployed    Social History Main Topics  . Smoking status: Current Every Day Smoker    Packs/day: 1.00    Years: 36.00    Types: Cigarettes    Start date: 66  . Smokeless tobacco: Never Used  . Alcohol use No  . Drug use: Yes    Types: Marijuana  . Sexual activity: Not Currently    Birth control/ protection: None   Other Topics Concern  . None   Social History Narrative   Prior traffic Administrator, sports - unemployed now   Widow   5 children, 10 grandchildren   Moved here from [place] 2013   Additional Social History:    Pain Medications: see MAR Prescriptions: see MAR Over the Counter: see MAR History of alcohol / drug use?: Yes Name of Substance 1: cannabis 1 - Amount (size/oz): joint, less than a joint, a few puffs 1 -  Frequency: weekly, or daily 1 - Duration: years 1 - Last Use / Amount: 4 days ago, feels it helps with her anxiety and PTSD                  Sleep: Fair  Appetite:  Fair  Current Medications: Current Facility-Administered Medications  Medication Dose Route Frequency Provider Last Rate Last Dose  . acetaminophen (TYLENOL) tablet 650 mg  650 mg Oral Q6H PRN Lindon Romp A, NP      . alum & mag hydroxide-simeth (MAALOX/MYLANTA) 200-200-20 MG/5ML suspension 30 mL  30 mL Oral Q4H PRN Lindon Romp A, NP      . ARIPiprazole (ABILIFY) tablet 5 mg  5 mg Oral Daily Izediuno, Laruth Bouchard, MD   5 mg at 12/31/16 0827  . famotidine (PEPCID) tablet 20 mg  20 mg  Oral Daily Lindon Romp A, NP   20 mg at 12/31/16 7341  . hydrOXYzine (ATARAX/VISTARIL) tablet 25 mg  25 mg Oral TID PRN Rozetta Nunnery, NP   25 mg at 12/30/16 2153  . magnesium hydroxide (MILK OF MAGNESIA) suspension 30 mL  30 mL Oral Daily PRN Lindon Romp A, NP      . naproxen (NAPROSYN) tablet 500 mg  500 mg Oral BID PRN Cobos, Myer Peer, MD   500 mg at 12/31/16 0640  . nicotine (NICODERM CQ - dosed in mg/24 hours) patch 14 mg  14 mg Transdermal Daily Cobos, Myer Peer, MD   14 mg at 12/31/16 0827  . omega-3 acid ethyl esters (LOVAZA) capsule 1 g  1 g Oral BID Mordecai Maes, NP   1 g at 12/31/16 0826  . pravastatin (PRAVACHOL) tablet 20 mg  20 mg Oral q1800 Mordecai Maes, NP   20 mg at 12/30/16 2153  . traZODone (DESYREL) tablet 50 mg  50 mg Oral QHS PRN Rozetta Nunnery, NP   50 mg at 12/30/16 2153    Lab Results:  Results for orders placed or performed during the hospital encounter of 12/29/16 (from the past 48 hour(s))  Lipid panel     Status: Abnormal   Collection Time: 12/30/16  6:22 AM  Result Value Ref Range   Cholesterol 219 (H) 0 - 200 mg/dL   Triglycerides 171 (H) <150 mg/dL   HDL 42 >40 mg/dL   Total CHOL/HDL Ratio 5.2 RATIO   VLDL 34 0 - 40 mg/dL   LDL Cholesterol 143 (H) 0 - 99 mg/dL    Comment:        Total Cholesterol/HDL:CHD Risk Coronary Heart Disease Risk Table                     Men   Women  1/2 Average Risk   3.4   3.3  Average Risk       5.0   4.4  2 X Average Risk   9.6   7.1  3 X Average Risk  23.4   11.0        Use the calculated Patient Ratio above and the CHD Risk Table to determine the patient's CHD Risk.        ATP III CLASSIFICATION (LDL):  <100     mg/dL   Optimal  100-129  mg/dL   Near or Above                    Optimal  130-159  mg/dL   Borderline  160-189  mg/dL   High  >190  mg/dL   Very High Performed at [facility], [place] [address] [place], [place] [address]   Hemoglobin A1c     Status: None   Collection Time:  12/30/16  6:22 AM  Result Value Ref Range   Hgb A1c MFr Bld 5.3 4.8 - 5.6 %    Comment: (NOTE)         Pre-diabetes: 5.7 - 6.4         Diabetes: >6.4         Glycemic control for adults with diabetes: <7.0    Mean Plasma Glucose 105 mg/dL    Comment: (NOTE) Performed At: [facility] [place], [place] [id number] Lindon Romp MD [id number] Performed at [facility], [place] [address], [place], [place] [address]     Blood Alcohol level:  Lab Results  Component Value Date   ETH <5 32/20/2542    Metabolic Disorder Labs: Lab Results  Component Value Date   HGBA1C 5.3 12/30/2016   MPG 105 12/30/2016   No results found for: PROLACTIN Lab Results  Component Value Date   CHOL 219 (H) 12/30/2016   TRIG 171 (H) 12/30/2016   HDL 42 12/30/2016   CHOLHDL 5.2 12/30/2016   VLDL 34 12/30/2016   LDLCALC 143 (H) 12/30/2016    Physical Findings: AIMS: Facial and Oral Movements Muscles of Facial Expression: None, normal Lips and Perioral Area: None, normal Jaw: None, normal Tongue: None, normal,Extremity Movements Upper (arms, wrists, hands, fingers): None, normal Lower (legs, knees, ankles, toes): None, normal, Trunk Movements Neck, shoulders, hips: None, normal, Overall Severity Severity of abnormal movements (highest score from questions above): None, normal Incapacitation due to abnormal movements: None, normal Patient's awareness of abnormal movements (rate only patient's report): No Awareness, Dental Status Current problems with teeth and/or dentures?: No Does patient usually wear dentures?: No  CIWA:    COWS:     Musculoskeletal: Strength & Muscle Tone: within normal limits Gait & Station: normal Patient leans: N/A  Psychiatric Specialty Exam: Physical Exam  Nursing note and vitals reviewed. Constitutional: She is oriented to person, place, and time.  Neurological: She is alert and oriented to person, place, and time.     Review of Systems  Psychiatric/Behavioral: Positive for depression. Negative for hallucinations, memory loss, substance abuse and suicidal ideas. The patient is nervous/anxious. The patient does not have insomnia.   All other systems reviewed and are negative.   Blood pressure 109/64, pulse (!) 51, temperature 98.4 F (36.9 C), temperature source Oral, resp. rate 16, height 5\' 5"  (1.651 m), weight 178 lb (80.7 kg).Body mass index is 29.62 kg/m.  General Appearance: Fairly Groomed  Eye Contact:  Good  Speech:  Clear and Coherent and Normal Rate  Volume:  Normal  Mood:  Anxious and Depressed  Affect:  Appropriate  Thought Process:  Coherent, Linear and Descriptions of Associations: Intact  Orientation:  Full (Time, Place, and Person)  Thought Content:  Logical denies AVH  Suicidal Thoughts:  No Denies SI. Is able to contract for safety on the unit  Homicidal Thoughts:  No  Memory:  Immediate;   Fair Recent;   Fair  Judgement:  Impaired  Insight:  Fair  Psychomotor Activity:  Normal  Concentration:  Concentration: Fair and Attention Span: Fair  Recall:  [employer] of Knowledge:  Good  Language:  Negative  Akathisia:  Negative  Handed:  Right  AIMS (if indicated):     Assets:  Desire for  Improvement Housing Resilience  ADL's:  Intact  Cognition:  WNL  Sleep:  Number of Hours: 6.75     Treatment Plan Summary: Daily contact with patient to assess and evaluate symptoms and progress in treatment   Medication management: Psychiatric conditions are unstable at this time. To reduce current symptoms to base line and improve the patient's overall level of functioning will continue Abilify 5 mg daily for Bipolar/mood stabilization as recommended by MD. Will monitor response to medication and adjust treatment plan as appropriate.   Nicotine Dependence-Continue Nicoderm patch 14 mg TD q24 hours.   Anxiety-Continue Vistaril 25 mg po TID as needed for anxiety.   Insomnia-Continue  Trazodone 50 mg po daily at bedtime as needed for sleep disturbance   Mild to Moderate Pain-Continue Naproxen 500 mg po bid as needed for pain management.   Hyperlipidemia-Continue Lovaza 1 g bid  and Pravastatin 20 mg po daily for hyperlipidemia    Other:  Safety: Will continue 15 minute observation for safety checks. Patient is able to contract for safety on the unit at this time  Continue to develop treatment plan to decrease risk of relapse upon discharge and to reduce the need for readmission.  Psycho-social education regarding relapse prevention and self care.  Health care follow up as needed for medical problems.  Continue to attend and participate in therapy.      Mordecai Maes, NP 12/31/2016, 11:43 AM

## 2016-12-31 NOTE — Progress Notes (Addendum)
D: Spoke with patient 1:1 who was up and active on the unit this AM however has been sleeping this afternoon. Patient's affect irritable and labile with congruent mood. Patient hyperverbal and tangential in speech, thought patterns. Verbalizes frustration with pain management stating her OP provider gives her ibuprofen and naproxen. Naproxen has been provided for her L shoulder pain as she states she has "frozen shoulder." Reports it is not helping. Patient rates depression at a 5/10, hopelessness at a 5/10 and anxiety at a 7/10. Rates sleep as good, appetite as good, energy as low and concentration as poor.  States goal for today is to "not want to die, stay alive." L shoulder pain remains at a 7/10 while awake, no other physical complaints.   A: Medicated per orders, vistaril prn given this afternoon for anxiety. Encouraged to speak with NP regarding pain management. Level III obs in place for safety. Emotional support offered and self inventory reviewed. Encouraged completion of Suicide Safety Plan and programming participation.   R: Patient verbalizes understanding of POC. On reassess, patient is asleep. Patient denies SI/HI/AVH and remains safe on level III obs.

## 2017-01-01 DIAGNOSIS — F39 Unspecified mood [affective] disorder: Secondary | ICD-10-CM

## 2017-01-01 MED ORDER — ARIPIPRAZOLE 10 MG PO TABS
10.0000 mg | ORAL_TABLET | Freq: Every day | ORAL | Status: DC
  Administered 2017-01-01: 10 mg via ORAL
  Filled 2017-01-01 (×3): qty 1

## 2017-01-01 MED ORDER — ARIPIPRAZOLE 5 MG PO TABS
5.0000 mg | ORAL_TABLET | Freq: Every day | ORAL | Status: DC
  Filled 2017-01-01 (×2): qty 1

## 2017-01-01 MED ORDER — LORAZEPAM 0.5 MG PO TABS
0.5000 mg | ORAL_TABLET | Freq: Four times a day (QID) | ORAL | Status: DC | PRN
  Administered 2017-01-01 – 2017-01-04 (×6): 0.5 mg via ORAL
  Filled 2017-01-01 (×6): qty 1

## 2017-01-01 NOTE — Progress Notes (Signed)
[facility] MD Progress Note  01/01/2017 4:47 PM Naraya Stoneberg  MRN:  [id number]  Subjective:  Patient states she remains depressed, but states her mood is " up and down". States " one minute I am depressed, the next I am feeling pissed off ".  She reports that in the past she has been diagnosed with Bipolar Disorder. She states she has been feeling more depressed recently due in part to first anniversary of her brother's death, who died from suicide last year.   Objective:  I have discussed case with treatment team and have met with patient. Patient is 51 year old female, who presented for depression, SI. She states she has been diagnosed with Bipolar Disorder in the past , and describes history of short lived mood swings and irritability. She attributes her depression due to anniversary of brother's death by suicide. Reports a history of losing loved ones (2nd husband died 09/15/2008 from suicide, brother died last year from suicide) .She has had one prior admission at age 19 for " behavioral problems". States that for " many years I was on no psychiatric medications". She was briefly on Celexa in the past . Denies alcohol abuse, reports smoking cannabis regularly, denies other drug abuse . At this time patient reports vague irritability, feeling " angry", presenting with some increased affective lability. States she has been " cussing at people for no reason recently ". Reports fair sleep, racing thoughts .  Limited group participation. Patient was on no psychiatric medications prior to admission- was started on Abilify.   Principal Problem: Bipolar 1 disorder, depressed ([place]) Diagnosis:   Patient Active Problem List   Diagnosis Date Noted  . Bipolar 1 disorder, depressed ([place]) [F31.9] 12/29/2016  . Family history of suicide [Z81.8] 11/20/2016  . Depression [F32.9] 11/20/2016  . Family history of breast cancer [Z80.3] 11/20/2016  . Adhesive capsulitis of left shoulder [M75.02] 11/19/2016  . Greater  trochanteric bursitis of right hip [M70.61] 11/19/2016   Total Time spent with patient: 20 minutes  Past Psychiatric History: Bipolar, MDD, GAD and PTSD  Past Medical History:  Past Medical History:  Diagnosis Date  . Cervical cancer ([place])   . Cervical cancer ([place])   . History of exercise stress test    ETT-Echo 5/18: normal EF, no ischemia  . History of loop recorder    currently on person  . Kidney stone     Past Surgical History:  Procedure Laterality Date  . CERVICAL CONE BIOPSY    . TUBAL LIGATION     Family History:  Family History  Problem Relation Age of Onset  . Heart attack Mother 9  . Heart disease Father   . Heart attack Father 33   Family Psychiatric  History:  Brother committed suicide  Social History:  History  Alcohol Use No     History  Drug Use  . Types: Marijuana    Social History   Social History  . Marital status: Widowed    Spouse name: N/A  . Number of children: N/A  . Years of education: N/A   Occupational History  . unemployed    Social History Main Topics  . Smoking status: Current Every Day Smoker    Packs/day: 1.00    Years: 36.00    Types: Cigarettes    Start date: 52  . Smokeless tobacco: Never Used  . Alcohol use No  . Drug use: Yes    Types: Marijuana  . Sexual activity: Not Currently  Birth control/ protection: None   Other Topics Concern  . None   Social History Narrative   Prior traffic Administrator, sports - unemployed now   Widow   5 children, 10 grandchildren   Moved here from [place] 2013   Additional Social History:    Pain Medications: see MAR Prescriptions: see MAR Over the Counter: see MAR History of alcohol / drug use?: Yes Name of Substance 1: cannabis 1 - Amount (size/oz): joint, less than a joint, a few puffs 1 - Frequency: weekly, or daily 1 - Duration: years 1 - Last Use / Amount: 4 days ago, feels it helps with her anxiety and PTSD  Sleep: Fair  Appetite:  Fair  Current  Medications: Current Facility-Administered Medications  Medication Dose Route Frequency Provider Last Rate Last Dose  . acetaminophen (TYLENOL) tablet 650 mg  650 mg Oral Q6H PRN Lindon Romp A, NP      . alum & mag hydroxide-simeth (MAALOX/MYLANTA) 200-200-20 MG/5ML suspension 30 mL  30 mL Oral Q4H PRN Lindon Romp A, NP      . ARIPiprazole (ABILIFY) tablet 5 mg  5 mg Oral QHS Theophilus Walz A, MD      . famotidine (PEPCID) tablet 20 mg  20 mg Oral Daily Lindon Romp A, NP   20 mg at 01/01/17 2500  . hydrOXYzine (ATARAX/VISTARIL) tablet 25 mg  25 mg Oral TID PRN Rozetta Nunnery, NP   25 mg at 01/01/17 1508  . magnesium hydroxide (MILK OF MAGNESIA) suspension 30 mL  30 mL Oral Daily PRN Lindon Romp A, NP      . naproxen (NAPROSYN) tablet 500 mg  500 mg Oral BID PRN [place] Eustache, Myer Peer, MD   500 mg at 01/01/17 0808  . nicotine (NICODERM CQ - dosed in mg/24 hours) patch 14 mg  14 mg Transdermal Daily Tanyika Barros, Myer Peer, MD   14 mg at 01/01/17 0807  . omega-3 acid ethyl esters (LOVAZA) capsule 1 g  1 g Oral BID Mordecai Maes, NP   1 g at 01/01/17 0807  . pravastatin (PRAVACHOL) tablet 20 mg  20 mg Oral q1800 Mordecai Maes, NP   20 mg at 12/31/16 1819  . traZODone (DESYREL) tablet 50 mg  50 mg Oral QHS PRN Rozetta Nunnery, NP   50 mg at 12/31/16 2112    Lab Results:  No results found for this or any previous visit (from the past 48 hour(s)).  Blood Alcohol level:  Lab Results  Component Value Date   ETH <5 37/10/8887    Metabolic Disorder Labs: Lab Results  Component Value Date   HGBA1C 5.3 12/30/2016   MPG 105 12/30/2016   No results found for: PROLACTIN Lab Results  Component Value Date   CHOL 219 (H) 12/30/2016   TRIG 171 (H) 12/30/2016   HDL 42 12/30/2016   CHOLHDL 5.2 12/30/2016   VLDL 34 12/30/2016   LDLCALC 143 (H) 12/30/2016    Physical Findings: AIMS: Facial and Oral Movements Muscles of Facial Expression: None, normal Lips and Perioral Area: None, normal Jaw:  None, normal Tongue: None, normal,Extremity Movements Upper (arms, wrists, hands, fingers): None, normal Lower (legs, knees, ankles, toes): None, normal, Trunk Movements Neck, shoulders, hips: None, normal, Overall Severity Severity of abnormal movements (highest score from questions above): None, normal Incapacitation due to abnormal movements: None, normal Patient's awareness of abnormal movements (rate only patient's report): No Awareness, Dental Status Current problems with teeth and/or dentures?: No Does patient usually wear dentures?:  No  CIWA:    COWS:     Musculoskeletal: Strength & Muscle Tone: within normal limits Gait & Station: normal Patient leans: N/A  Psychiatric Specialty Exam: Physical Exam  Nursing note and vitals reviewed. Constitutional: She is oriented to person, place, and time.  Neurological: She is alert and oriented to person, place, and time.    Review of Systems  Psychiatric/Behavioral: Positive for depression. Negative for hallucinations, memory loss, substance abuse and suicidal ideas. The patient is nervous/anxious. The patient does not have insomnia.   All other systems reviewed and are negative. denies headache, no chest pain, no shortness of breath, no vomiting, no diarrhea , no rash  Blood pressure 113/70, pulse 64, temperature 98.5 F (36.9 C), temperature source Oral, resp. rate 18, height '5\' 5"'  (1.651 m), weight 80.7 kg (178 lb), SpO2 100 %.Body mass index is 29.62 kg/m.  General Appearance: Well Groomed  Eye Contact:  Good  Speech:  Normal Rate- talkative but not pressured   Volume:  Normal  Mood:  reports depression but also vague irritability  Affect:  mildly irritable  Thought Process:  Linear and Descriptions of Associations: Intact- no flight of ideations   Orientation:  Other:  fully alert and attentive  Thought Content:  no hallucinations, no delusions, not internally preoccupied    Suicidal Thoughts:  No denies any suicidal or self  injurious ideations, denies any homicidal ideations, contracts for safety on unit   Homicidal Thoughts:  No  Memory:  Recent and remote grossly intact   Judgement:  Fair  Insight:  Fair  Psychomotor Activity:  Normal  Concentration:  Concentration: Fair and Attention Span: Fair  Recall:  [employer] of Knowledge:  Good  Language:  Negative  Akathisia:  Negative  Handed:  Right  AIMS (if indicated):     Assets:  Desire for Improvement Housing Resilience  ADL's:  Intact  Cognition:  WNL  Sleep:  Number of Hours: 5.75   Assessment - patient presents with symptoms suggestive of mixed episode- she reports a history of prior bipolar disorder diagnosis, reports depression, sadness, but also a vague but uncomfortable sense of irritability, racing thoughts, agitation, and inability to sleep. Currently on Abilify, denies side effects.   Treatment Plan Summary: Treatment plan reviewed as below today 6/25  Daily contact with patient to assess and evaluate symptoms and progress in treatment  Encourage group and milieu participation to work on coping skills and symptom reduction  Mood Disorder : Increase Abilify to 10 mgrs QDAY   Nicotine Dependence-Continue Nicoderm patch 14 mg TD q24 hours.   Anxiety-Start Ativan 0.5 mgrs Q 6 hours PRN for anxiety   Insomnia-Continue Trazodone 50 mg po daily at bedtime as needed for sleep disturbance   Mild to Moderate Pain-Continue Naproxen 500 mg po bid as needed for pain management.   Hyperlipidemia-Continue Lovaza 1 g bid  and Pravastatin 20 mg po daily for hyperlipidemia  Treatment team working on disposition planning options  Check EKG     Jenne Campus, MD 01/01/2017, 4:47 PM   Patient ID: [name], female   DOB: 21-Jun-1966, 30 y.o.   MRN: [id number]

## 2017-01-01 NOTE — Progress Notes (Signed)
Adult Psychoeducational Group Note  Date:  01/01/2017 Time:  2:36 PM  Group Topic/Focus:  Recovery Goals:   The focus of this group is to identify appropriate goals for recovery and establish a plan to achieve them.  Participation Level:  Active  Participation Quality:  Appropriate  Affect:  Appropriate  Cognitive:  Alert  Insight: Good  Engagement in Group:  Engaged  Modes of Intervention:  Discussion  Additional Comments:  Pt did participate in group today.  Pt states that she has been feeling depressed and mentioned that she knows some people who have committed suicide, pt states that she was overwhelmed by this and sought treatment.  Emmette Katt R Jordani Nunn 01/01/2017, 2:36 PM

## 2017-01-01 NOTE — Progress Notes (Signed)
Patient ID: Vida Nicol, female   DOB: Nov 20, 1965, 51 y.o.   MRN: [id number]  Pt currently presents with a flat affect and anxious behavior. Pt yells at [employer] "Can you give me my drugs? I am ready to get my dope!!" Pt states her day was good except for increased agitation during the afternoon. In regards to Ativan, pt states "I tried to tell you guys I needed something stronger, after I threw a fit the doctor was like "here let me give it to you." Pt reports poos sleep with current medication regimen due to "the beds being so uncomfortable."   Pt provided with medications per providers orders. Pt's labs and vitals were monitored throughout the night. Pt given a 1:1 about emotional and mental status. Pt supported and encouraged to express concerns and questions. Pt educated on medications. Environment adjusted to increase patients sleep comfort in room.   Pt's safety ensured with 15 minute and environmental checks. Pt currently denies SI/HI and A/V hallucinations. Pt verbally agrees to seek staff if SI/HI or A/VH occurs and to consult with staff before acting on any harmful thoughts. Will continue POC.

## 2017-01-01 NOTE — [facility] Group Notes (Signed)
[place] LCSW Group Therapy  01/01/2017 1:15pm  Type of Therapy: Group Therapy   Topic: Overcoming Obstacles  Participation Level: Pt was in and out of group. Did not participate.  Georga Kaufmann, MSW, [place]

## 2017-01-01 NOTE — Tx Team (Signed)
Interdisciplinary Treatment and Diagnostic Plan Update 01/01/2017 Time of Session: 9:30am  Katie Sherman  MRN: [id number]  Principal Diagnosis: Bipolar 1 disorder, depressed ([place])  Secondary Diagnoses: Principal Problem:   Bipolar 1 disorder, depressed ([place])   Current Medications:  Current Facility-Administered Medications  Medication Dose Route Frequency Provider Last Rate Last Dose  . acetaminophen (TYLENOL) tablet 650 mg  650 mg Oral Q6H PRN Lindon Romp A, NP      . alum & mag hydroxide-simeth (MAALOX/MYLANTA) 200-200-20 MG/5ML suspension 30 mL  30 mL Oral Q4H PRN Lindon Romp A, NP      . ARIPiprazole (ABILIFY) tablet 5 mg  5 mg Oral QHS Cobos, Fernando A, MD      . famotidine (PEPCID) tablet 20 mg  20 mg Oral Daily Lindon Romp A, NP   20 mg at 01/01/17 4098  . hydrOXYzine (ATARAX/VISTARIL) tablet 25 mg  25 mg Oral TID PRN Rozetta Nunnery, NP   25 mg at 01/01/17 1508  . magnesium hydroxide (MILK OF MAGNESIA) suspension 30 mL  30 mL Oral Daily PRN Lindon Romp A, NP      . naproxen (NAPROSYN) tablet 500 mg  500 mg Oral BID PRN Cobos, Myer Peer, MD   500 mg at 01/01/17 0808  . nicotine (NICODERM CQ - dosed in mg/24 hours) patch 14 mg  14 mg Transdermal Daily Cobos, Myer Peer, MD   14 mg at 01/01/17 0807  . omega-3 acid ethyl esters (LOVAZA) capsule 1 g  1 g Oral BID Mordecai Maes, NP   1 g at 01/01/17 0807  . pravastatin (PRAVACHOL) tablet 20 mg  20 mg Oral q1800 Mordecai Maes, NP   20 mg at 12/31/16 1819  . traZODone (DESYREL) tablet 50 mg  50 mg Oral QHS PRN Rozetta Nunnery, NP   50 mg at 12/31/16 2112    PTA Medications: Prescriptions Prior to Admission  Medication Sig Dispense Refill Last Dose  . ibuprofen (ADVIL,MOTRIN) 600 MG tablet Take 1 tablet (600 mg total) by mouth every 6 (six) hours as needed for moderate pain or cramping (Take with food.). 30 tablet 0 Past Week at Unknown time  . naproxen sodium (ANAPROX) 220 MG tablet Take 220-440 mg by mouth 2 (two) times  daily with a meal.   12/29/2016 at Unknown time  . ranitidine (ZANTAC) 150 MG tablet Take 1 tablet (150 mg total) by mouth 2 (two) times daily as needed for heartburn. 60 tablet 0 12/29/2016 at Unknown time  . traMADol (ULTRAM) 50 MG tablet Take 1 tablet (50 mg total) by mouth every 8 (eight) hours as needed for severe pain. (Patient not taking: Reported on 12/29/2016) 30 tablet 0 Completed Course at Unknown time    Treatment Modalities: Medication Management, Group therapy, Case management,  1 to 1 session with clinician, Psychoeducation, Recreational therapy.  Patient Stressors: Financial difficulties Loss of brother to suicide a year ago Medication change or noncompliance Traumatic event Patient Strengths: Curator fund of knowledge  Physician Treatment Plan for Primary Diagnosis: Bipolar 1 disorder, depressed ([place]) Long Term Goal(s): Improvement in symptoms so as ready for discharge Short Term Goals: Ability to identify changes in lifestyle to reduce recurrence of condition will improve Ability to verbalize feelings will improve Compliance with prescribed medications will improve Ability to identify triggers associated with substance abuse/mental health issues will improve Ability to disclose and discuss suicidal ideas Ability to identify and develop effective coping behaviors will improve  Medication Management: Evaluate patient's response, side  effects, and tolerance of medication regimen.  Therapeutic Interventions: 1 to 1 sessions, Unit Group sessions and Medication administration.  Evaluation of Outcomes: Progressing  Physician Treatment Plan for Secondary Diagnosis: Principal Problem:   Bipolar 1 disorder, depressed ([place])  Long Term Goal(s): Improvement in symptoms so as ready for discharge  Short Term Goals: Ability to identify changes in lifestyle to reduce recurrence of condition will improve Ability to verbalize feelings will improve Compliance with  prescribed medications will improve Ability to identify triggers associated with substance abuse/mental health issues will improve Ability to disclose and discuss suicidal ideas Ability to identify and develop effective coping behaviors will improve  Medication Management: Evaluate patient's response, side effects, and tolerance of medication regimen.  Therapeutic Interventions: 1 to 1 sessions, Unit Group sessions and Medication administration.  Evaluation of Outcomes: Progressing  RN Treatment Plan for Primary Diagnosis: Bipolar 1 disorder, depressed ([place]) Long Term Goal(s): Knowledge of disease and therapeutic regimen to maintain health will improve  Short Term Goals: Compliance with prescribed medications will improve  Medication Management: RN will administer medications as ordered by provider, will assess and evaluate patient's response and provide education to patient for prescribed medication. RN will report any adverse and/or side effects to prescribing provider.  Therapeutic Interventions: 1 on 1 counseling sessions, Psychoeducation, Medication administration, Evaluate responses to treatment, Monitor vital signs and CBGs as ordered, Perform/monitor CIWA, COWS, AIMS and Fall Risk screenings as ordered, Perform wound care treatments as ordered.  Evaluation of Outcomes: Progressing  LCSW Treatment Plan for Primary Diagnosis: Bipolar 1 disorder, depressed ([place]) Long Term Goal(s): Safe transition to appropriate next level of care at discharge, Engage patient in therapeutic group addressing interpersonal concerns. Short Term Goals: Engage patient in aftercare planning with referrals and resources, Increase emotional regulation, Identify triggers associated with mental health/substance abuse issues and Increase skills for wellness and recovery  Therapeutic Interventions: Assess for all discharge needs, 1 to 1 time with Social worker, Explore available resources and support systems,  Assess for adequacy in community support network, Educate family and significant other(s) on suicide prevention, Complete Psychosocial Assessment, Interpersonal group therapy.  Evaluation of Outcomes: Progressing  Progress in Treatment: Attending groups: Intermittently  Participating in groups: Yes, when she attends. Taking medication as prescribed: Yes, MD continues to assess for medication changes as needed Toleration medication: Yes, no side effects reported at this time Family/Significant other contact made: No, pt declines contact at this time Patient understands diagnosis: Developing insight  Discussing patient identified problems/goals with staff: Yes Medical problems stabilized or resolved: Yes Denies suicidal/homicidal ideation: No, pt endorses passive SI Issues/concerns per patient self-inventory: None Other: N/A  New problem(s) identified: None identified at this time.   New Short Term/Long Term Goal(s): "I would like to get on medications that will stabilize me. My anti-depressent isn't enough"   Discharge Plan or Barriers: Pt will return home and follow up outpatient with [facility].  Reason for Continuation of Hospitalization:  Anxiety  Depression Medication stabilization Suicidal ideation  Estimated Length of Stay: 3-5 days  Attendees: Patient: 01/01/2017 4:32 PM  Physician: Dr. Parke Poisson 01/01/2017 4:32 PM  Nursing: Trinna Post RN; [place], RN 01/01/2017 4:32 PM  RN Care Manager:  01/01/2017 4:32 PM  Social Worker: Matthew Saras, [place] 01/01/2017 4:32 PM  Recreational Therapist:  01/01/2017 4:32 PM  Other:  01/01/2017 4:32 PM  Other:  01/01/2017 4:32 PM  Other: 01/01/2017 4:32 PM  Scribe for Treatment Team: Georga Kaufmann, MSW,LCSWA 01/01/2017 4:32 PM

## 2017-01-01 NOTE — Progress Notes (Signed)
Patient ID: Katie Sherman, female   DOB: [birth year], 50 y.o.   MRN: [id number]   Pt seen walking out of her room. Pt hunches over when she sees Probation officer. Reports pain in her L side for approximately 15 seconds. Writer instructs patient to sit down. Pt does so. AC and provider notified. Vitals WDL BP 130/75, P 63, Sp02 100% on room air. Respiration rate elevated at 24. Pt reports pain dissipated during assessment of vitals. Reports experience was different than the syncopal episodes she was experiencing PTA. Per providers orders, we will continue to monitor patient. Pt given nighttime medications and prn anxiety medication. Lying in bed now.

## 2017-01-01 NOTE — Progress Notes (Signed)
D: Pt presents with flat affect and anxious mood. Pt reported feeling less depressed today and more "manic". Pt stated that she feels a sense of impulsiveness today. Pt denies SI/HI. Pt stated that she's still grieving the loss of her brother who committed suicide last year. Pt stated that Saturday was the anniversary of his death. Pt stated that the Abilify made her sleep more than usual yesterday and would like to take it at bedtime. Writer notified MD. Per MD writer may adjust Abilify so that pt takes it at bedtime. Pt made aware in treatment team of med adjustment. Pt stated goal is to get on medications that will stabilize her mood and attend grief counseling. Pt agreed to meeting with the hosp Chaplain.  A: Medications reviewed with pt. Medications administered as ordered per MD. Verbal support provided. Pt encouraged to attend groups. Pt attended tx team this am. 15 minute checks performed for safety. R: Pt receptive to tx. Pt verbalized wanting to rescind 72 hour request for discharge form.

## 2017-01-01 NOTE — Progress Notes (Signed)
Recreation Therapy Notes  Date: 01/01/17 Time: 0930 Location: 300 [address] Dayroom  Group Topic: Stress Management  Goal Area(s) Addresses:  Patient will verbalize importance of using healthy stress management.  Patient will identify positive emotions associated with healthy stress management.   Intervention: Stress Management  Activity :  Peace[place]aves.  LRT introduced the stress management technique of guided imagery.  LRT read a script to allow patients the opportunity for take a mental journey to the beach.  Patients were to follow along as LRT read script to engage in the technique.  Education: Stress Management, Discharge Planning.   Education Outcome: Acknowledges edcuation/In group clarification offered/Needs additional education  Clinical Observations/Feedback: Pt did not attend group.   Tanay Misuraca Ria Comment, LRT/CTRS        Ria Comment, Shiv Shuey A 01/01/2017 1:07 PM

## 2017-01-01 NOTE — Plan of Care (Signed)
Problem: Activity: Goal: Imbalance in normal sleep/wake cycle will improve Outcome: Progressing Pt reported poor sleep last night. Pt encouraged to stay away during the daytime in order to improve sleep tonight. Pt compliant with staying awake this shift.

## 2017-01-02 MED ORDER — TRAZODONE HCL 100 MG PO TABS
100.0000 mg | ORAL_TABLET | Freq: Every evening | ORAL | Status: DC | PRN
  Administered 2017-01-02: 100 mg via ORAL
  Filled 2017-01-02: qty 1

## 2017-01-02 NOTE — Progress Notes (Signed)
  DATA ACTION RESPONSE  Objective- Pt. is visible in the dayroom, seen interacting with peers loudly in the milieu. Pt. apepars to be intrusive at times. Presents with a labile     affect and mood.C/o of anxiety and pain this evening. No abnormal s/s.  Subjective- Denies having any SI/HI/AVH at this time. Rates pain 5/10; R. Hip and L. Shoulder. Pt. states " I rather take the Abilify in the morning; It keeps me up at night". Is cooperative and remain safe on the unit.  1:1 interaction in private to establish rapport. Encouragement, education, & support given from staff.  PRN Ativan,Trazodone, Robaxin requested and will re-eval accordingly.   Safety maintained with Q 15 checks. Continue with POC.

## 2017-01-02 NOTE — Progress Notes (Signed)
Recreation Therapy Notes  Animal-Assisted Activity (AAA) Program Checklist/Progress Notes Patient Eligibility Criteria Checklist & Daily Group note for Rec TxIntervention  Date: 06.26.2018 Time: 2:45pm Location: [address]   AAA/T Program Assumption of Risk Form signed by Patient/ or Parent Legal Guardian Yes  Patient is free of allergies or sever asthma Yes  Patient reports no fear of animals Yes  Patient reports no history of cruelty to animals Yes  Patient understands his/her participation is voluntary Yes  Patient washes hands before animal contact Yes  Patient washes hands after animal contact Yes  Behavioral Response: Appropriate   Education:Hand Washing, Appropriate Animal Interaction   Education Outcome: Acknowledges education.   Clinical Observations/Feedback: Patient attended session and interacted appropriately with therapy dog and peers.    Laureen Ochs Roxane [place], LRT/CTRS        Tremaine Fuhriman L 01/02/2017 3:06 PM

## 2017-01-02 NOTE — Progress Notes (Signed)
[facility] MD Progress Note  01/02/2017 2:59 PM Katie Sherman  MRN:  [id number]  Subjective:  Patient has reported partial improvement compared to how she felt prior to admission. Today focuses on family issues.  She states " I know my daughter knows I am in the hospital, but she has not called or visited".  States she is happy because another daughter  had a child yesterday evening- states " baby and mom are doing well ".  Reports lingering insomnia . States " I may be sleeping a little better , but still not good ".   Objective:  I have discussed case with treatment team and have met with patient. Patient continues to report affective symptoms but states that overall she has been feeling better. States " I still feel anxious". States she feels less irritable. She has been visible on unit, going to groups, participating .  Denies medication side effects. ( Currently on Abilify for mood disorder  , Ativan PRNs for anxiety  , Trazodone for insomnia)  Denies any suicidal ideations.   Principal Problem: Bipolar 1 disorder, depressed ([place]) Diagnosis:   Patient Active Problem List   Diagnosis Date Noted  . Bipolar 1 disorder, depressed ([place]) [F31.9] 12/29/2016  . Family history of suicide [Z81.8] 11/20/2016  . Depression [F32.9] 11/20/2016  . Family history of breast cancer [Z80.3] 11/20/2016  . Adhesive capsulitis of left shoulder [M75.02] 11/19/2016  . Greater trochanteric bursitis of right hip [M70.61] 11/19/2016   Total Time spent with patient: 20 minutes  Past Psychiatric History: Bipolar, MDD, GAD and PTSD  Past Medical History:  Past Medical History:  Diagnosis Date  . Cervical cancer ([place])   . Cervical cancer ([place])   . History of exercise stress test    ETT-Echo 5/18: normal EF, no ischemia  . History of loop recorder    currently on person  . Kidney stone     Past Surgical History:  Procedure Laterality Date  . CERVICAL CONE BIOPSY    . TUBAL LIGATION     Family History:   Family History  Problem Relation Age of Onset  . Heart attack Mother 53  . Heart disease Father   . Heart attack Father 52   Family Psychiatric  History:  Brother committed suicide  Social History:  History  Alcohol Use No     History  Drug Use  . Types: Marijuana    Social History   Social History  . Marital status: Widowed    Spouse name: N/A  . Number of children: N/A  . Years of education: N/A   Occupational History  . unemployed    Social History Main Topics  . Smoking status: Current Every Day Smoker    Packs/day: 1.00    Years: 36.00    Types: Cigarettes    Start date: 57  . Smokeless tobacco: Never Used  . Alcohol use No  . Drug use: Yes    Types: Marijuana  . Sexual activity: Not Currently    Birth control/ protection: None   Other Topics Concern  . None   Social History Narrative   Prior traffic Administrator, sports - unemployed now   Widow   5 children, 10 grandchildren   Moved here from [place] 2013   Additional Social History:    Pain Medications: see MAR Prescriptions: see MAR Over the Counter: see MAR History of alcohol / drug use?: Yes Name of Substance 1: cannabis 1 - Amount (size/oz): joint, less than a joint,  a few puffs 1 - Frequency: weekly, or daily 1 - Duration: years 1 - Last Use / Amount: 4 days ago, feels it helps with her anxiety and PTSD  Sleep: Fair  Appetite:  Fair  Current Medications: Current Facility-Administered Medications  Medication Dose Route Frequency Provider Last Rate Last Dose  . acetaminophen (TYLENOL) tablet 650 mg  650 mg Oral Q6H PRN Lindon Romp A, NP      . alum & mag hydroxide-simeth (MAALOX/MYLANTA) 200-200-20 MG/5ML suspension 30 mL  30 mL Oral Q4H PRN Lindon Romp A, NP      . ARIPiprazole (ABILIFY) tablet 10 mg  10 mg Oral QHS Cobos, Myer Peer, MD   10 mg at 01/01/17 2208  . famotidine (PEPCID) tablet 20 mg  20 mg Oral Daily Lindon Romp A, NP   20 mg at 01/02/17 0824  . LORazepam (ATIVAN)  tablet 0.5 mg  0.5 mg Oral Q6H PRN Cobos, Myer Peer, MD   0.5 mg at 01/02/17 1455  . magnesium hydroxide (MILK OF MAGNESIA) suspension 30 mL  30 mL Oral Daily PRN Lindon Romp A, NP      . naproxen (NAPROSYN) tablet 500 mg  500 mg Oral BID PRN Cobos, Myer Peer, MD   500 mg at 01/02/17 5643  . nicotine (NICODERM CQ - dosed in mg/24 hours) patch 14 mg  14 mg Transdermal Daily Cobos, Myer Peer, MD   14 mg at 01/02/17 0824  . omega-3 acid ethyl esters (LOVAZA) capsule 1 g  1 g Oral BID Mordecai Maes, NP   1 g at 01/02/17 0824  . pravastatin (PRAVACHOL) tablet 20 mg  20 mg Oral q1800 Mordecai Maes, NP   20 mg at 01/01/17 1718  . traZODone (DESYREL) tablet 50 mg  50 mg Oral QHS PRN Lindon Romp A, NP   50 mg at 01/01/17 2208    Lab Results:  No results found for this or any previous visit (from the past 48 hour(s)).  Blood Alcohol level:  Lab Results  Component Value Date   ETH <5 32/95/1884    Metabolic Disorder Labs: Lab Results  Component Value Date   HGBA1C 5.3 12/30/2016   MPG 105 12/30/2016   No results found for: PROLACTIN Lab Results  Component Value Date   CHOL 219 (H) 12/30/2016   TRIG 171 (H) 12/30/2016   HDL 42 12/30/2016   CHOLHDL 5.2 12/30/2016   VLDL 34 12/30/2016   LDLCALC 143 (H) 12/30/2016    Physical Findings: AIMS: Facial and Oral Movements Muscles of Facial Expression: None, normal Lips and Perioral Area: None, normal Jaw: None, normal Tongue: None, normal,Extremity Movements Upper (arms, wrists, hands, fingers): None, normal Lower (legs, knees, ankles, toes): None, normal, Trunk Movements Neck, shoulders, hips: None, normal, Overall Severity Severity of abnormal movements (highest score from questions above): None, normal Incapacitation due to abnormal movements: None, normal Patient's awareness of abnormal movements (rate only patient's report): No Awareness, Dental Status Current problems with teeth and/or dentures?: No Does patient usually  wear dentures?: No  CIWA:    COWS:     Musculoskeletal: Strength & Muscle Tone: within normal limits Gait & Station: normal Patient leans: N/A  Psychiatric Specialty Exam: Physical Exam  Nursing note and vitals reviewed. Constitutional: She is oriented to person, place, and time.  Neurological: She is alert and oriented to person, place, and time.    Review of Systems  Psychiatric/Behavioral: Positive for depression. Negative for hallucinations, memory loss, substance abuse and suicidal ideas. The  patient is nervous/anxious. The patient does not have insomnia.   All other systems reviewed and are negative. denies headache, no chest pain, no shortness of breath, no vomiting, no diarrhea , no rash  Blood pressure 118/75, pulse 74, temperature 99.2 F (37.3 C), temperature source Oral, resp. rate 16, height '5\' 5"'  (1.651 m), weight 80.7 kg (178 lb), SpO2 100 %.Body mass index is 29.62 kg/m.  General Appearance: Well Groomed  Eye Contact:  Good  Speech:  Normal Rate  Volume:  Normal  Mood:  partially improved mood   Affect:  mildly anxious, less irritable   Thought Process:  Goal Directed and Descriptions of Associations: Intact  Orientation:  Full (Time, Place, and Person)  Thought Content:  no hallucinations, no delusions, not internally preoccupied    Suicidal Thoughts:  No denies suicidal or self injurious ideations, denies violent ideations   Homicidal Thoughts:  No  Memory:  Recent and remote grossly intact   Judgement:  Fair- improving   Insight:  Fair- improving   Psychomotor Activity:  Normal  Concentration:  Concentration: improving  and Attention Span: improving   Recall:  Good  Fund of Knowledge:  Good  Language:  Negative  Akathisia:  Negative  Handed:  Right  AIMS (if indicated):     Assets:  Desire for Improvement Housing Resilience  ADL's:  Intact  Cognition:  WNL  Sleep:  Number of Hours: 6   Assessment - Patient is presenting with partial improvement of  mood and range of affect. Remains vaguely anxious, but is less dysphoric and less irritable. Denies suicidal ideations at this time. Tolerating medications well, denies side effects. Reports ongoing insomnia , although it has improved partially. As she improves she is becoming more future oriented. Reports apprehension about discharge due in part to significant psycho-social stressors , mainly lack of stable housing/homelessness .   Treatment plan reviewed as below today 6/26 Daily contact with patient to assess and evaluate symptoms and progress in treatment  Encourage group and milieu participation to work on coping skills and symptom reduction  Mood Disorder : Continue Abilify to 10 mgrs QDAY   Nicotine Dependence-Continue Nicoderm patch 14 mg TD q24 hours.   Anxiety-Start Ativan 0.5 mgrs Q 6 hours PRN for anxiety   Insomnia- Increase Trazodone to 100 mg po daily at bedtime as needed for sleep disturbance   Mild to Moderate Pain-Continue Naproxen 500 mg po bid as needed for pain management.   Hyperlipidemia-Continue Lovaza 1 g bid  and Pravastatin 20 mg po daily for hyperlipidemia  Treatment team working on disposition planning options     Jenne Campus, MD 01/02/2017, 2:59 PM   Patient ID: Katie Sherman, female   DOB: 1966-07-05, 51 y.o.   MRN: [id number]

## 2017-01-02 NOTE — BHH Group Notes (Signed)
[place] LCSW Group Therapy 01/02/2017 1:15 PM  Type of Therapy: Group Therapy- Feelings about Diagnosis  Participation Level: Active   Participation Quality:  Appropriate  Affect:  Appropriate  Cognitive: Alert and Oriented   Insight:  Developing   Engagement in Therapy: Developing/Improving and Engaged   Modes of Intervention: Clarification, Confrontation, Discussion, Education, Exploration, Limit-setting, Orientation, Problem-solving, Rapport Building, Art therapist, Socialization and Support  Description of Group:   This group will allow patients to explore their thoughts and feelings about diagnoses they have received. Patients will be guided to explore their level of understanding and acceptance of these diagnoses. Facilitator will encourage patients to process their thoughts and feelings about the reactions of others to their diagnosis, and will guide patients in identifying ways to discuss their diagnosis with significant others in their lives. This group will be process-oriented, with patients participating in exploration of their own experiences as well as giving and receiving support and challenge from other group members.  Summary of Progress/Problems:  Pt asked several questions about PTSD and states that she suffers from it. CSW provided psychoeducation. Pt states that she believes her diagnosis is accurate but she was in denial of it for a long time. Pt states that now she understands the importance of going to therapy and making the effort to manage her symptoms.  Therapeutic Modalities:   Cognitive Behavioral Therapy Solution Focused Therapy Motivational Interviewing Relapse Prevention Therapy  Matthew Saras, MSW, LCSW 01/02/2017 4:22 PM

## 2017-01-02 NOTE — Plan of Care (Signed)
Problem: Health Behavior/Discharge Planning: Goal: Identification of resources available to assist in meeting health care needs will improve Outcome: Progressing Patient is able to identify resources that are available to her after discharge.

## 2017-01-02 NOTE — Progress Notes (Signed)
Adult Psychoeducational Group Note  Date:  01/02/2017 Time:  10:45 PM  Group Topic/Focus:  Wrap-Up Group:   The focus of this group is to help patients review their daily goal of treatment and discuss progress on daily workbooks.  Participation Level:  Active  Participation Quality:  Appropriate  Affect:  Appropriate  Cognitive:  Alert  Insight: Appropriate  Engagement in Group:  Engaged  Modes of Intervention:  Discussion  Additional Comments:  Patient stated having a good day.   Jsoeph Podesta L Madalena Kesecker 01/02/2017, 10:45 PM

## 2017-01-02 NOTE — Progress Notes (Signed)
D: Patient states she is sleeping fair; her appetite is good;her energy level is low and her concentration is poor.  She rates her depression as a 4; hopelessness as a 2; anxiety as a 5.  She denies any thoughts of self harm toward herself or others.  She does not appear to be responding to internal stimuli.  She has not exhibited any medication seeking behavior today.  Her mood is irritable; her affect is flat.  She is observed in the day room interacting with her peers.  She can be hyperverbal with rapid, pressured speech.  Her goal today is to "not have feelings of wanting to die; suicide."  She is taking ibuprofen for pain in there hip and shoulder.   A:  Continue to monitor medication management and MD orders.  Safety checks continued every 15 minutes per protocol.  Offer support and encouragement as needed. R: Patient is receptive to staff; her behavior is appropriate.

## 2017-01-03 LAB — PREGNANCY, URINE: Preg Test, Ur: NEGATIVE

## 2017-01-03 MED ORDER — ARIPIPRAZOLE 10 MG PO TABS
10.0000 mg | ORAL_TABLET | Freq: Every day | ORAL | Status: DC
  Administered 2017-01-03: 10 mg via ORAL
  Filled 2017-01-03 (×2): qty 1

## 2017-01-03 MED ORDER — ARIPIPRAZOLE 15 MG PO TABS
15.0000 mg | ORAL_TABLET | Freq: Every day | ORAL | Status: DC
  Administered 2017-01-04: 15 mg via ORAL
  Filled 2017-01-03 (×2): qty 7
  Filled 2017-01-03 (×2): qty 1

## 2017-01-03 MED ORDER — ARIPIPRAZOLE 10 MG PO TABS: 10.0000 mg | ORAL_TABLET | Freq: Every day | ORAL | Status: DC

## 2017-01-03 MED ORDER — TRAZODONE HCL 150 MG PO TABS
150.0000 mg | ORAL_TABLET | Freq: Every evening | ORAL | Status: DC | PRN
  Administered 2017-01-03: 150 mg via ORAL
  Filled 2017-01-03: qty 1
  Filled 2017-01-03: qty 7

## 2017-01-03 NOTE — Progress Notes (Signed)
Recreation Therapy Notes  Date: 01/03/17 Time: 0930 Location: 300 Hall Dayroom  Group Topic: Stress Management  Goal Area(s) Addresses:  Patient will verbalize importance of using healthy stress management.  Patient will identify positive emotions associated with healthy stress management.   Intervention: Stress Management  Activity :  Meditation.  LRT introduced the stress management technique of meditation.  LRT played a meditation from the calm app to get patients engaged in the technique.  Patients were to follow along as the meditation played to participate.  Education:  Stress Management, Discharge Planning.   Education Outcome: Acknowledges edcuation/In group clarification offered/Needs additional education  Clinical Observations/Feedback: Pt did not attend group.   Victorino Sparrow, LRT/CTRS         Victorino Sparrow A 01/03/2017 11:34 AM

## 2017-01-03 NOTE — Progress Notes (Signed)
D: Pt presents with an animated affect and labile/anxious mood. Pt is impulsive, intrusive, needy and attention seeking. Pt constantly at nurses station interrupting other pts while they are speaking with staff and repeatedly asking the same questions. Pt speech is pressured, rapid and loud. Pt appears to have racing thoughts. Pt is now requesting to take Abilify in the am after requesting to take it at bedtime on Monday d/t drowsines. Pt stated that she wants to take Abilify in the mornings now because she think it's jacking her up at night. Pt reports ongoing insomnia. Pt stated that she doesn't want her sleep med adjusted due to possible side effects. Pt stated that Pepcid is not effective for her Acid reflux and would like to take a stronger medication for Acid reflux. Pt reported having crying spells this morning. Pt rates anxiety 7/10.  Depression 3/10. Pt denies SI. Pt stated goal is to "not have suicidal ideations, keep calm, not call Janett Billow or let her upset me".  A: Medications reviewed with pt x2. Medications administered as ordered per MD. Verbal support provided. Pt encouraged to attend groups. 15 minute checks performed for safety. Pt redirected as needed for inappropriate behaviors.  R: Pt receptive to tx.

## 2017-01-03 NOTE — BHH Group Notes (Signed)
[facility] Mental Health Association Group Therapy 01/03/2017 1:15pm  Type of Therapy: Mental Health Association Presentation  Participation Level: Active  Participation Quality: Attentive  Affect: Appropriate  Cognitive: Oriented  Insight: Developing/Improving  Engagement in Therapy: Engaged  Modes of Intervention: Discussion, Education and Socialization  Summary of Progress/Problems: Mental Health Association ([place]) Speaker came to talk about his personal journey with living with a mental health diagnosis.The pt processed ways by which to relate to the speaker. [place] speaker provided handouts and educational information pertaining to groups and services offered by the [facility]. Pt was engaged in speaker's presentation and was receptive to resources provided.    Kara Mead. Marshell Levan, MSW, [place] 01/03/2017 5:07 PM

## 2017-01-03 NOTE — BHH Group Notes (Signed)
Pt informed CSW that she may have a place to stay with a friend and is hopeful for discharge tomorrow.   Georga Kaufmann, MSW, [place]

## 2017-01-03 NOTE — Progress Notes (Signed)
[facility] MD Progress Note  01/03/2017 11:31 AM Katie Sherman  MRN:  [id number]  Subjective:  Patient states she feels she has improved compared to admission. Denies medication side effects. She continues to ruminate about strained relationship with one of her adult daughters. States she has been upset that this daughter has not visited her even though she knows patient is hospitalized. States " we spoke on the phone and we ended up arguing- made me upset . I am not going to call her anymore". Denies medication side effects, but states Abilify would be better at QAM dosing than QHS , as she feels it may be contributing to some insomnia. Denies suicidal ideations.    Objective:  I have discussed case with treatment team and have met with patient. Patient presents partially improved compared to admission, presents calmer and less irritable. Does continue to ruminate about family relationship issues as above. Staff reports that patient remains labile at times, with rapid speech,and interrupting other patients at times .  She denies suicidal ideations.Affect presents partially improved, more reactive, less irritable. Today focusing on medications, feels Abilify helping , but wanting to change to AM dosing . Reports some ongoing insomnia. Patient is visible in milieu,at times needing redirections from staff due to speaking loudly or interrupting .    Principal Problem: Bipolar 1 disorder, depressed ([place]) Diagnosis:   Patient Active Problem List   Diagnosis Date Noted  . Bipolar 1 disorder, depressed ([place]) [F31.9] 12/29/2016  . Family history of suicide [Z81.8] 11/20/2016  . Depression [F32.9] 11/20/2016  . Family history of breast cancer [Z80.3] 11/20/2016  . Adhesive capsulitis of left shoulder [M75.02] 11/19/2016  . Greater trochanteric bursitis of right hip [M70.61] 11/19/2016   Total Time spent with patient: 20 minutes  Past Psychiatric History: Bipolar, MDD, GAD and PTSD  Past Medical  History:  Past Medical History:  Diagnosis Date  . Cervical cancer ([place])   . Cervical cancer ([place])   . History of exercise stress test    ETT-Echo 5/18: normal EF, no ischemia  . History of loop recorder    currently on person  . Kidney stone     Past Surgical History:  Procedure Laterality Date  . CERVICAL CONE BIOPSY    . TUBAL LIGATION     Family History:  Family History  Problem Relation Age of Onset  . Heart attack Mother 37  . Heart disease Father   . Heart attack Father 32   Family Psychiatric  History:  Brother committed suicide  Social History:  History  Alcohol Use No     History  Drug Use  . Types: Marijuana    Social History   Social History  . Marital status: Widowed    Spouse name: N/A  . Number of children: N/A  . Years of education: N/A   Occupational History  . unemployed    Social History Main Topics  . Smoking status: Current Every Day Smoker    Packs/day: 1.00    Years: 36.00    Types: Cigarettes    Start date: 69  . Smokeless tobacco: Never Used  . Alcohol use No  . Drug use: Yes    Types: Marijuana  . Sexual activity: Not Currently    Birth control/ protection: None   Other Topics Concern  . None   Social History Narrative   Prior traffic Administrator, sports - unemployed now   Widow   5 children, 10 grandchildren   Moved here from [place]  2013   Additional Social History:    Pain Medications: see MAR Prescriptions: see MAR Over the Counter: see MAR History of alcohol / drug use?: Yes Name of Substance 1: cannabis 1 - Amount (size/oz): joint, less than a joint, a few puffs 1 - Frequency: weekly, or daily 1 - Duration: years 1 - Last Use / Amount: 4 days ago, feels it helps with her anxiety and PTSD  Sleep: Poor  Appetite:  good   Current Medications: Current Facility-Administered Medications  Medication Dose Route Frequency Provider Last Rate Last Dose  . acetaminophen (TYLENOL) tablet 650 mg  650 mg Oral Q6H  PRN Lindon Romp A, NP      . alum & mag hydroxide-simeth (MAALOX/MYLANTA) 200-200-20 MG/5ML suspension 30 mL  30 mL Oral Q4H PRN Lindon Romp A, NP      . ARIPiprazole (ABILIFY) tablet 10 mg  10 mg Oral Daily Jackelin Correia, Myer Peer, MD   10 mg at 01/03/17 1013  . famotidine (PEPCID) tablet 20 mg  20 mg Oral Daily Lindon Romp A, NP   20 mg at 01/03/17 0757  . LORazepam (ATIVAN) tablet 0.5 mg  0.5 mg Oral Q6H PRN Chastidy Ranker, Myer Peer, MD   0.5 mg at 01/02/17 2133  . magnesium hydroxide (MILK OF MAGNESIA) suspension 30 mL  30 mL Oral Daily PRN Lindon Romp A, NP      . naproxen (NAPROSYN) tablet 500 mg  500 mg Oral BID PRN Leetta Hendriks, Myer Peer, MD   500 mg at 01/03/17 0759  . nicotine (NICODERM CQ - dosed in mg/24 hours) patch 14 mg  14 mg Transdermal Daily Maryjane Benedict, Myer Peer, MD   14 mg at 01/03/17 0757  . omega-3 acid ethyl esters (LOVAZA) capsule 1 g  1 g Oral BID Mordecai Maes, NP   1 g at 01/03/17 0757  . pravastatin (PRAVACHOL) tablet 20 mg  20 mg Oral q1800 Mordecai Maes, NP   20 mg at 01/02/17 1813  . traZODone (DESYREL) tablet 100 mg  100 mg Oral QHS PRN Dontario Evetts, Myer Peer, MD   100 mg at 01/02/17 2216    Lab Results:  No results found for this or any previous visit (from the past 48 hour(s)).  Blood Alcohol level:  Lab Results  Component Value Date   ETH <5 00/92/3300    Metabolic Disorder Labs: Lab Results  Component Value Date   HGBA1C 5.3 12/30/2016   MPG 105 12/30/2016   No results found for: PROLACTIN Lab Results  Component Value Date   CHOL 219 (H) 12/30/2016   TRIG 171 (H) 12/30/2016   HDL 42 12/30/2016   CHOLHDL 5.2 12/30/2016   VLDL 34 12/30/2016   LDLCALC 143 (H) 12/30/2016    Physical Findings: AIMS: Facial and Oral Movements Muscles of Facial Expression: None, normal Lips and Perioral Area: None, normal Jaw: None, normal Tongue: None, normal,Extremity Movements Upper (arms, wrists, hands, fingers): None, normal Lower (legs, knees, ankles, toes): None,  normal, Trunk Movements Neck, shoulders, hips: None, normal, Overall Severity Severity of abnormal movements (highest score from questions above): None, normal Incapacitation due to abnormal movements: None, normal Patient's awareness of abnormal movements (rate only patient's report): No Awareness, Dental Status Current problems with teeth and/or dentures?: No Does patient usually wear dentures?: No  CIWA:    COWS:     Musculoskeletal: Strength & Muscle Tone: within normal limits Gait & Station: normal Patient leans: N/A  Psychiatric Specialty Exam: Physical Exam  Nursing note and vitals reviewed.  Constitutional: She is oriented to person, place, and time.  Neurological: She is alert and oriented to person, place, and time.    Review of Systems  Psychiatric/Behavioral: Positive for depression. Negative for hallucinations, memory loss, substance abuse and suicidal ideas. The patient is nervous/anxious. The patient does not have insomnia.   All other systems reviewed and are negative. denies headache, no chest pain, no shortness of breath, no vomiting, no diarrhea , no rash  Blood pressure 121/78, pulse 71, temperature 97.7 F (36.5 C), resp. rate 16, height _0  (1.651 m), weight 80.7 kg (178 lb), SpO2 100 %.Body mass index is 29.62 kg/m.  General Appearance: Well Groomed  Eye Contact:  Good  Speech:  Normal Rate-not currently pressured   Volume:  Normal  Mood:  improving  Affect:  improved compared to admission, vaguely expansive and irritable at times  Thought Process:  Goal Directed and Descriptions of Associations: Intact- no flight of ideations at this time   Orientation:  Full (Time, Place, and Person)  Thought Content:  no hallucinations, no delusions, not internally preoccupied  - ruminative about strained relationship with one of her daughters  Suicidal Thoughts:  No denies suicidal or self injurious ideations, denies violent ideations   Homicidal Thoughts:  No   Memory:  Recent and remote grossly intact   Judgement:  Fair- improving   Insight:  Fair- improving   Psychomotor Activity:  Normal  Concentration:  Concentration: improving  and Attention Span: improving   Recall:  Good  Fund of Knowledge:  Good  Language:  Negative  Akathisia:  Negative  Handed:  Right  AIMS (if indicated):     Assets:  Desire for Improvement Housing Resilience  ADL's:  Intact  Cognition:  WNL  Sleep:  Number of Hours: 3.75   Assessment - patient is a 51 year old female, who reports history of bipolar disorder. At this time reports partial improvement , but continues to present with emotional lability, loud speech at times, tendency to interrupt others. Sleep is poor. She denies suicidal ideations at this time. Thus far tolerating Abilify well, but states she prefers AM dosing .   PLAN: Treatment plan reviewed as below today 6/27  Daily contact with patient to assess and evaluate symptoms and progress in treatment  Encourage group and milieu participation to work on coping skills and symptom reduction  Mood Disorder :  Increase  Abilify to 15  mgrs QAM  Nicotine Dependence-Continue Nicoderm patch 14 mg TD q24 hours.   Anxiety-Continue Ativan 0.5 mgrs Q 6 hours PRN for anxiety   Insomnia- Increase Trazodone to 150 mg po daily at bedtime as needed for sleep disturbance   Mild to Moderate Pain-Continue Naproxen 500 mg po bid as needed for pain management.   Hyperlipidemia-Continue Lovaza 1 g bid  and Pravastatin 20 mg po daily for hyperlipidemia  Treatment team working on disposition planning options     Jenne Campus, MD 01/03/2017, 11:31 AM   Patient ID: Katie Sherman, female   DOB: 03-27-1966, 51 y.o.   MRN: [id number]

## 2017-01-04 MED ORDER — RANITIDINE HCL 150 MG PO TABS: 150.0000 mg | ORAL_TABLET | Freq: Two times a day (BID) | ORAL | 0 refills | Status: DC | PRN

## 2017-01-04 MED ORDER — ARIPIPRAZOLE 15 MG PO TABS: 15.0000 mg | ORAL_TABLET | Freq: Every day | ORAL | 0 refills | Status: DC

## 2017-01-04 MED ORDER — PRAVASTATIN SODIUM 20 MG PO TABS: 20.0000 mg | ORAL_TABLET | Freq: Every day | ORAL | 0 refills | Status: DC

## 2017-01-04 MED ORDER — GABAPENTIN 100 MG PO CAPS: 100.0000 mg | ORAL_CAPSULE | Freq: Three times a day (TID) | ORAL | 0 refills | Status: DC

## 2017-01-04 MED ORDER — TRAZODONE HCL 150 MG PO TABS: 150.0000 mg | ORAL_TABLET | Freq: Every evening | ORAL | 0 refills | Status: DC | PRN

## 2017-01-04 MED ORDER — NAPROXEN SODIUM 220 MG PO TABS: 220.0000 mg | ORAL_TABLET | Freq: Two times a day (BID) | ORAL | Status: DC

## 2017-01-04 MED ORDER — LORAZEPAM 0.5 MG PO TABS: 0.5000 mg | ORAL_TABLET | Freq: Four times a day (QID) | ORAL | 0 refills | Status: DC | PRN

## 2017-01-04 MED ORDER — GABAPENTIN 100 MG PO CAPS
100.0000 mg | ORAL_CAPSULE | Freq: Three times a day (TID) | ORAL | Status: DC
  Filled 2017-01-04: qty 1
  Filled 2017-01-04 (×2): qty 21
  Filled 2017-01-04 (×3): qty 1
  Filled 2017-01-04: qty 21
  Filled 2017-01-04: qty 1

## 2017-01-04 MED ORDER — OMEGA-3-ACID ETHYL ESTERS 1 G PO CAPS: 1.0000 g | ORAL_CAPSULE | Freq: Two times a day (BID) | ORAL | 0 refills | Status: DC

## 2017-01-04 MED ORDER — NICOTINE 14 MG/24HR TD PT24: 14.0000 mg | MEDICATED_PATCH | Freq: Every day | TRANSDERMAL | 0 refills | Status: DC

## 2017-01-04 MED FILL — NICOTINE 14 MG/24HR PATCH: 14 | 28 days supply | Qty: 28 | Fill #0

## 2017-01-04 MED FILL — traZODone HCL 150 MG TABS: 150 | 30 days supply | Qty: 30 | Fill #0

## 2017-01-04 MED FILL — GABAPENTIN 100 MG CAP: 100 | 30 days supply | Qty: 90 | Fill #0

## 2017-01-04 MED FILL — PRAVASTATIN NA 20 MG TAB: 20 | 30 days supply | Qty: 30 | Fill #0

## 2017-01-04 MED FILL — ARIPiprazole 15 MG TABS: 15 | 30 days supply | Qty: 30 | Fill #0

## 2017-01-04 MED FILL — raNITIdine HCL 150 MG TABS: 150 | 30 days supply | Qty: 30 | Fill #0

## 2017-01-04 MED FILL — OMEGA-3 ETHYL ESTERS 1 GM C: 1 | 30 days supply | Qty: 30 | Fill #0

## 2017-01-04 NOTE — Progress Notes (Signed)
Discharge note: Pt denies SI/HI.  Pt received both written and verbal discharge instructions. Pt verbalized understanding of discharge instructions. Pt agreed to f/u appt and med regimen. Pt received sample meds, prescriptions, AVS, SRA, transitional record and a bus pass. Pt gathered belongings from room and locker. Pt safely discharged to the lobby.

## 2017-01-04 NOTE — BHH Suicide Risk Assessment (Addendum)
[facility] Discharge Suicide Risk Assessment   Principal Problem: Bipolar 1 disorder, depressed [facility]) Discharge Diagnoses:  Patient Active Problem List   Diagnosis Date Noted  . Bipolar 1 disorder, depressed ([place]) [F31.9] 12/29/2016  . Family history of suicide [Z81.8] 11/20/2016  . Depression [F32.9] 11/20/2016  . Family history of breast cancer [Z80.3] 11/20/2016  . Adhesive capsulitis of left shoulder [M75.02] 11/19/2016  . Greater trochanteric bursitis of right hip [M70.61] 11/19/2016    Total Time spent with patient: 30 minutes  Musculoskeletal: Strength & Muscle Tone: within normal limits Gait & Station: normal Patient leans: N/A  Psychiatric Specialty Exam: ROS  Denies chest pain, no shortness of breath, no vomiting   Blood pressure 108/66, pulse 85, temperature 97.8 F (36.6 C), temperature source Oral, resp. rate 18, height 5\' 5"  (1.651 m), weight 80.7 kg (178 lb), SpO2 100 %.Body mass index is 29.62 kg/m.  General Appearance: Well Groomed  Engineer, water::  Good  Speech:  Normal Rate409  Volume:  Normal  Mood:  describes her mood is "OK", denies depression  Affect:  appropriate,slightly irritable   Thought Process:  Linear and Descriptions of Associations: Intact  Orientation:  Other:  fully alert and attentive   Thought Content:  no hallucinations, no delusions, not internally preoccupied   Suicidal Thoughts:  No denies any suicidal or self injurious ideations, denies any homicidal or violent ideations  Homicidal Thoughts:  No  Memory:  recent and remote grossly intact   Judgement:  Fair- improving   Insight:  Fair- improving   Psychomotor Activity:  Normal  Concentration:  Good  Recall:  Good  Fund of Knowledge:Good  Language: Good  Akathisia:  No  Handed:  Right  AIMS (if indicated):   denies any abnormal or involuntary movements noted or reported.   Assets:  Communication Skills Desire for Improvement Resilience  Sleep:  Number of Hours: 6.75  Cognition: WNL   ADL's:  Intact   Mental Status Per Nursing Assessment::   On Admission:  Suicidal ideation indicated by patient  Demographic Factors:  51 year old female, lives alone, but states she plans to go live with a niece   Loss Factors: Loss  of loved ones, recent anniversary of her brother's death   Historical Factors: Has been diagnosed with Bipolar Disorder , history of prior suicide attempts   Risk Reduction Factors:   Sense of responsibility to family and Positive coping skills or problem solving skills  Continued Clinical Symptoms:  At this time patient is alert , attentive, slightly irritable, but calm. Mood described as improved, minimizes depression at this time, affect is slightly irritable, but reactive and full in range, no thought disorder, no suicidal or self injurious ideations, no homicidal or violent ideations, future oriented . Denies medication side effects. - We reviewed side effects , including risk of movement disorders, metabolic side effects, sedation, akathisia. Patient states that in the past she has been on Neurontin and remembers it as helpful, well tolerated, and effective for back pain and for anxiety. Hoping to restart it. We discussed side effects and started her on Neurontin 100 mgrs TID. Behavior on unit in good control.   Cognitive Features That Contribute To Risk:  No gross cognitive deficits noted upon discharge. Is alert , attentive, and oriented x 3   Suicide Risk:  Mild:  Suicidal ideation of limited frequency, intensity, duration, and specificity.  There are no identifiable plans, no associated intent, mild dysphoria and related symptoms, good self-control (both objective and  subjective assessment), few other risk factors, and identifiable protective factors, including available and accessible social support.  Follow-up Information    [facility] on 01/05/2017.   Specialty:  Professional Counselor Why:  Therapy appointment  at [place] with Sharyn Lull. Contact information: [facility] [address] [phone]        [facility] Follow up.   Specialty:  Behavioral Health Why:  Please go for a walk-in appointment within 7 days of discharge to be established for medication management. Walk-in hours are Mon-Fri 8am-3pm. Please arrive as early as possible to be sure that you are seen. Thank you! Contact information: [place] [address] [phone]           Plan Of Care/Follow-up recommendations:  Activity:  as tolerated Diet:  Regular Tests:  NA Other:  See below  Patient is reporting readiness for discharge- there are no current grounds for involuntary commitment  She is planning to follow up as above Encouraged and supported ongoing smoking cessation efforts- she plans to continue nicotine patch She has an outpatient cardiologist, Dr. Kathlen Mody .   Jenne Campus, MD 01/04/2017, 1:39 PM

## 2017-01-04 NOTE — Progress Notes (Signed)
Adult Psychoeducational Group Note  Date:  01/04/2017 Time:  2:23 PM  Group Topic/Focus:  Managing Feelings:   The focus of this group is to identify what feelings patients have difficulty handling and develop a plan to handle them in a healthier way upon discharge.  Participation Level:  Active  Participation Quality:  Attentive  Affect:  Appropriate  Cognitive:  Alert  Insight: Appropriate  Engagement in Group:  Engaged  Modes of Intervention:  Discussion  Additional Comments:  Pt participated in group today and talked to other patients and offered her opinion in discussion.  Alba Perillo R [name] 01/04/2017, 2:23 PM

## 2017-01-04 NOTE — Progress Notes (Signed)
  [facility] Adult Case Management Discharge Plan :  Will you be returning to the same living situation after discharge:  No. Pt will be going to live with a friend. At discharge, do you have transportation home?: Yes,  pt has access to transportation. Do you have the ability to pay for your medications: Yes,  prescriptions and samples provided.  Release of information consent forms completed and in the chart;  Patient's signature needed at discharge.  Patient to Follow up at: Follow-up Information    [facility] on 01/05/2017.   Specialty:  Professional Counselor Why:  Therapy appointment at [place] with Sharyn Lull. Contact information: [facility] [place] [address] [phone]        [facility] Follow up.   Specialty:  Behavioral Health Why:  Please go for a walk-in appointment within 7 days of discharge to be established for medication management. Walk-in hours are Mon-Fri 8am-3pm. Please arrive as early as possible to be sure that you are seen. Thank you! Contact information: [place] [address] [phone]           Next level of care provider has access to [place] and Suicide Prevention discussed: Yes,  with pt.  Have you used any form of tobacco in the last 30 days? (Cigarettes, Smokeless Tobacco, Cigars, and/or Pipes): Yes  Has patient been referred to the Quitline?: Patient refused referral  Patient has been referred for addiction treatment: Yes  Georga Kaufmann, MSW, LCSWA 01/04/2017, 11:23 AM

## 2017-01-04 NOTE — Discharge Summary (Signed)
Physician Discharge Summary Note  Patient:  Katie Sherman is an 51 y.o., female MRN:  [id number] DOB:  [birth year] Patient phone:  [phone] (home)  Patient address:   [place] [address],  Total Time spent with patient: Greater than 30 minutes  Date of Admission:  12/29/2016 Date of Discharge: 01-04-17  Reason for Admission: Worsening symptoms of depression triggering suicidal ideations.  Principal Problem: Bipolar 1 disorder, depressed [facility])  Discharge Diagnoses: Patient Active Problem List   Diagnosis Date Noted  . Bipolar 1 disorder, depressed ([place]) [F31.9] 12/29/2016  . Family history of suicide [Z81.8] 11/20/2016  . Depression [F32.9] 11/20/2016  . Family history of breast cancer [Z80.3] 11/20/2016  . Adhesive capsulitis of left shoulder [M75.02] 11/19/2016  . Greater trochanteric bursitis of right hip [M70.61] 11/19/2016   Past Psychiatric History: Bipolar 1 disorder  Past Medical History:  Past Medical History:  Diagnosis Date  . Cervical cancer ([place])   . Cervical cancer ([place])   . History of exercise stress test    ETT-Echo 5/18: normal EF, no ischemia  . History of loop recorder    currently on person  . Kidney stone     Past Surgical History:  Procedure Laterality Date  . CERVICAL CONE BIOPSY    . TUBAL LIGATION     Family History:  Family History  Problem Relation Age of Onset  . Heart attack Mother 64  . Heart disease Father   . Heart attack Father 47   Family Psychiatric  History: See H&P.  Social History:  History  Alcohol Use No     History  Drug Use  . Types: Marijuana    Social History   Social History  . Marital status: Widowed    Spouse name: N/A  . Number of children: N/A  . Years of education: N/A   Occupational History  . unemployed    Social History Main Topics  . Smoking status: Current Every Day Smoker    Packs/day: 1.00    Years: 36.00    Types: Cigarettes    Start date: 76  . Smokeless  tobacco: Never Used  . Alcohol use No  . Drug use: Yes    Types: Marijuana  . Sexual activity: Not Currently    Birth control/ protection: None   Other Topics Concern  . None   Social History Narrative   Prior traffic Administrator, sports - unemployed now   Widow   5 children, 10 grandchildren   Moved here from [place] Hospital Course: 51 year old female admitted to [facility] for suicidal ideation. She admits that a few weeks ago she had SI with a plan to go to her father's home, wait until he left the room and grab one of his hand guns to shoot herself. She reports a history of Bipolar, MDD, GAD and PTSD. She reports has depression has worsened over the past couple of weeks and yesterday she went to [facility] to get help and restart medication to better manage her depression and suicidal thoughts. She reports she has been on psychiatric medications in the past yet reports she has been off her medications since at least 2013. Reports she has never been complaint with her medications. She reports 1 prior SA at the age of 32 and reports at that time, she overdosed on pills. Reports a history of intermittent SI with multiple plans in the past.   After the admission assessment, Katie Sherman was started on medication  regimen for her presenting symptoms. She was medicated & discharged on; Abilify 15 mg for mood control, Gabapentin 100 mg for agitation, Ativan 0.5 mg prn for anxiety, Nicotine patch 14 mg for smoking cessation & Trazodone 150 mg for insomnia. Katie Sherman was also enrolled & participated in the group counseling sessions being offered & held on this unit. She learned coping skills that should help her after discharge to cope better & maintain mood stability.  As her treatment progressed, daily assessment notes marked improvement in Katie Sherman's symptoms.  She feels good. She is optimistic about the future from here on. No psychotic features. No craving for THC. No anxiety. The features of  depression has markedly improved since starting medications. No thoughts of violence. No access to weapons. No new stressors.    Katie Sherman's case was presented during treatment team meeting this morning. The nursing staff reports that patient has been appropriate on the unit. Patient has been interacting well with peers. No behavioral issues. Patient has not voiced any suicidal thoughts. Patient has not been observed to be internally stimulated or preoccupied. Patient has been adherent with treatment recommendations. Patient has been tolerating their medication well.   During care review & discussion of her progress this morning at the treatment team meeting. Team members feel that patient is back to her baseline level of function. Team agrees with plan to discharge patient today to continue mental health care on an outpatient basis. Katie Sherman will receiving these services at the [facility] both in [place], [place]. She is provided with all the necessary information needed to make this appointment without any problems. She left [facility] with all personal belongings in no apparent distress. Transportation per her arrangement.  Physical Findings: AIMS: Facial and Oral Movements Muscles of Facial Expression: None, normal Lips and Perioral Area: None, normal Jaw: None, normal Tongue: None, normal,Extremity Movements Upper (arms, wrists, hands, fingers): None, normal Lower (legs, knees, ankles, toes): None, normal, Trunk Movements Neck, shoulders, hips: None, normal, Overall Severity Severity of abnormal movements (highest score from questions above): None, normal Incapacitation due to abnormal movements: None, normal Patient's awareness of abnormal movements (rate only patient's report): No Awareness, Dental Status Current problems with teeth and/or dentures?: No Does patient usually wear dentures?: No  CIWA:    COWS:     Musculoskeletal: Strength & Muscle Tone:  within normal limits Gait & Station: normal Patient leans: N/A  Psychiatric Specialty Exam: Physical Exam  Constitutional: She is oriented to person, place, and time. She appears well-developed.  HENT:  Head: Normocephalic.  Eyes: Pupils are equal, round, and reactive to light.  Neck: Normal range of motion.  Cardiovascular: Normal rate.   Respiratory: Effort normal.  GI: Soft.  Genitourinary:  Genitourinary Comments: Deferred  Musculoskeletal: Normal range of motion.  Neurological: She is alert and oriented to person, place, and time.  Skin: Skin is warm and dry.    Review of Systems  Constitutional: Negative.   HENT: Negative.   Eyes: Negative.   Respiratory: Negative.   Cardiovascular: Negative.   Gastrointestinal: Negative.   Genitourinary: Negative.   Musculoskeletal: Negative.   Skin: Negative.   Neurological: Negative.   Endo/Heme/Allergies: Negative.   Psychiatric/Behavioral: Positive for depression (Stable) and substance abuse (Hx. cannabis use disorder). Negative for hallucinations, memory loss and suicidal ideas. The patient has insomnia (Stable). The patient is not nervous/anxious.     Blood pressure 108/66, pulse 85, temperature 97.8 F (36.6 C), temperature source Oral, resp. rate  18, height 5\' 5"  (1.651 m), weight 80.7 kg (178 lb), SpO2 100 %.Body mass index is 29.62 kg/m.  See Md's SRA   Have you used any form of tobacco in the last 30 days? (Cigarettes, Smokeless Tobacco, Cigars, and/or Pipes): Yes  Has this patient used any form of tobacco in the last 30 days? (Cigarettes, Smokeless Tobacco, Cigars, and/or Pipes):Yes, provided with a nicotine patch prescription for smoking cessation.  Blood Alcohol level:  Lab Results  Component Value Date   ETH <5 33/82/5053   Metabolic Disorder Labs:  Lab Results  Component Value Date   HGBA1C 5.3 12/30/2016   MPG 105 12/30/2016   No results found for: PROLACTIN Lab Results  Component Value Date   CHOL 219  (H) 12/30/2016   TRIG 171 (H) 12/30/2016   HDL 42 12/30/2016   CHOLHDL 5.2 12/30/2016   VLDL 34 12/30/2016   LDLCALC 143 (H) 12/30/2016   See Psychiatric Specialty Exam and Suicide Risk Assessment completed by Attending Physician prior to discharge.  Discharge destination:  Home  Is patient on multiple antipsychotic therapies at discharge:  No   Has Patient had three or more failed trials of antipsychotic monotherapy by history:  No  Recommended Plan for Multiple Antipsychotic Therapies: NA  Allergies as of 01/04/2017   No Known Allergies     Medication List    STOP taking these medications   ibuprofen 600 MG tablet Commonly known as:  ADVIL,MOTRIN   traMADol 50 MG tablet Commonly known as:  ULTRAM     TAKE these medications     Indication  ARIPiprazole 15 MG tablet Commonly known as:  ABILIFY Take 1 tablet (15 mg total) by mouth daily. For mood control Start taking on:  01/05/2017  Indication:  Mood control   gabapentin 100 MG capsule Commonly known as:  NEURONTIN Take 1 capsule (100 mg total) by mouth 3 (three) times daily. For agitation  Indication:  Agitation   LORazepam 0.5 MG tablet Commonly known as:  ATIVAN Take 1 tablet (0.5 mg total) by mouth every 6 (six) hours as needed for anxiety.  Indication:  Anxiousness associated with Depression   naproxen sodium 220 MG tablet Commonly known as:  ANAPROX Take 1-2 tablets (220-440 mg total) by mouth 2 (two) times daily with a meal. For pain What changed:  additional instructions  Indication:  Pain   nicotine 14 mg/24hr patch Commonly known as:  NICODERM CQ - dosed in mg/24 hours Place 1 patch (14 mg total) onto the skin daily. For smoking cessation Start taking on:  01/05/2017  Indication:  Nicotine Addiction   omega-3 acid ethyl esters 1 g capsule Commonly known as:  LOVAZA Take 1 capsule (1 g total) by mouth 2 (two) times daily. For high cholesterol  Indication:  High Amount of Triglycerides in the  Blood   pravastatin 20 MG tablet Commonly known as:  PRAVACHOL Take 1 tablet (20 mg total) by mouth daily at 6 PM. For high cholesterol  Indication:  Inherited Heterozygous Hypercholesterolemia   ranitidine 150 MG tablet Commonly known as:  ZANTAC Take 1 tablet (150 mg total) by mouth 2 (two) times daily as needed for heartburn.  Indication:  Gastroesophageal Reflux Disease   traZODone 150 MG tablet Commonly known as:  DESYREL Take 1 tablet (150 mg total) by mouth at bedtime as needed for sleep.  Indication:  Trouble Sleeping      Follow-up Information    [facility] on 01/05/2017.  Specialty:  Professional Counselor Why:  Therapy appointment at [place] with Sharyn Lull. Contact information: [facility] [address] [phone]        [facility] Follow up.   Specialty:  Behavioral Health Why:  Please go for a walk-in appointment within 7 days of discharge to be established for medication management. Walk-in hours are Mon-Fri 8am-3pm. Please arrive as early as possible to be sure that you are seen. Thank you! Contact information: [place] [address] [phone]          Follow-up recommendations: Activity:  As tolerated Diet: As recommended by your primary care doctor. Keep all scheduled follow-up appointments as recommended.   Comments: Patient is instructed prior to discharge to: Take all medications as prescribed by his/her mental healthcare provider. Report any adverse effects and or reactions from the medicines to his/her outpatient provider promptly. Patient has been instructed & cautioned: To not engage in alcohol and or illegal drug use while on prescription medicines. In the event of worsening symptoms, patient is instructed to call the crisis hotline, 911 and or go to the nearest ED for appropriate evaluation and treatment of symptoms. To follow-up with his/her primary  care provider for your other medical issues, concerns and or health care needs.   Signed: Encarnacion Slates, NP, PMHNP, FNP-BC 01/04/2017, 2:14 PM  Patient seen, Suicide Assessment Completed.  Disposition Plan Reviewed

## 2017-01-04 NOTE — Progress Notes (Signed)
Pt observed in the dayroom most of the evening, talking with peers and watching TV.  She was sitting toward the back of the dayroom at one point to herself looking sullen and irritable.  Writer asked pt if she was ok, and was there something Probation officer could do for her.  Pt stated she was upset, but would not elaborate on why.  Writer was able to engage pt in conversation about other things, trying to get her mind off what was upsetting her.  She denies SI/HI/AVH at this time.  She makes her needs known to staff.  She has been polite and appropriate with writer this evening.  Support and encouragement offered.  Discharge plans are in process.  Safety maintained with q15 minute checks.

## 2017-01-09 ENCOUNTER — Other Ambulatory Visit: Payer: Self-pay | Admitting: Physician Assistant

## 2017-01-09 DIAGNOSIS — R55 Syncope and collapse: Secondary | ICD-10-CM

## 2017-01-09 DIAGNOSIS — R001 Bradycardia, unspecified: Secondary | ICD-10-CM

## 2017-01-17 ENCOUNTER — Other Ambulatory Visit: Payer: Self-pay | Admitting: Family Medicine

## 2017-01-17 ENCOUNTER — Telehealth: Payer: Self-pay | Admitting: Family Medicine

## 2017-01-17 DIAGNOSIS — M25512 Pain in left shoulder: Principal | ICD-10-CM

## 2017-01-17 DIAGNOSIS — G8929 Other chronic pain: Secondary | ICD-10-CM

## 2017-01-17 MED ORDER — IBUPROFEN 800 MG PO TABS: 800.0000 mg | ORAL_TABLET | Freq: Three times a day (TID) | ORAL | 0 refills | Status: DC | PRN

## 2017-01-17 MED FILL — IBUPROFEN 800 MG TABLET: 800 | 10 days supply | Qty: 30 | Fill #0

## 2017-01-17 NOTE — Telephone Encounter (Signed)
Pt calling to request an increase in ibuprofen from 600mg  to 800mg . States that she is not takingNaproxen and she is out of there 600 mg ibuprofen. Requests a call back to advise.

## 2017-01-17 NOTE — Telephone Encounter (Signed)
Ibuprofen 800 mg sent to  [facility] pharmacy. Do not take naprosyn while taking ibuprofen. Take ibuprofen with food.

## 2017-01-17 NOTE — Telephone Encounter (Signed)
CMA call regarding medication refill sent to the pharmacy   Patient did not answer but left a VM stating the reason of the call

## 2017-01-22 ENCOUNTER — Encounter: Payer: Self-pay | Admitting: Obstetrics and Gynecology

## 2017-01-22 ENCOUNTER — Ambulatory Visit (INDEPENDENT_AMBULATORY_CARE_PROVIDER_SITE_OTHER): Payer: Self-pay | Admitting: Clinical

## 2017-01-22 ENCOUNTER — Ambulatory Visit (INDEPENDENT_AMBULATORY_CARE_PROVIDER_SITE_OTHER): Payer: Self-pay | Admitting: Obstetrics and Gynecology

## 2017-01-22 VITALS — BP 120/80 | HR 75 | Ht 67.0 in | Wt 179.0 lb

## 2017-01-22 DIAGNOSIS — F3160 Bipolar disorder, current episode mixed, unspecified: Secondary | ICD-10-CM

## 2017-01-22 DIAGNOSIS — Z780 Asymptomatic menopausal state: Secondary | ICD-10-CM | POA: Insufficient documentation

## 2017-01-22 DIAGNOSIS — F4321 Adjustment disorder with depressed mood: Secondary | ICD-10-CM

## 2017-01-22 DIAGNOSIS — F4320 Adjustment disorder, unspecified: Secondary | ICD-10-CM

## 2017-01-22 DIAGNOSIS — F319 Bipolar disorder, unspecified: Secondary | ICD-10-CM

## 2017-01-22 NOTE — BH Specialist Note (Signed)
Integrated Behavioral Health Initial Visit  MRN: [id number] Name: Katie Sherman   Session Start time: 10:00 Session End time: 10:30 Total time: 30 minutes  Type of Service: [place] Interpretor:No. Interpretor Name and Language: n/a   Warm Hand Off Completed.       SUBJECTIVE: Amarilis Belflower is a 51 y.o. female accompanied by patient. Patient was referred by Dr Rip Harbour for depression, anxiety. Patient reports the following symptoms/concerns: Pt states that her primary concern today is feeling emotional as she remembers her brother(birthday coming up this week) and husband, both lost to suicide, along with psychosocial stressors (homelessness, unemployment, relationship issues). Pt is seeing a therapist, and will see psychiatrist for [facility] med management within one month.  Duration of problem: Current stress, today; ongoing for over 6 years; Severity of problem: severe  OBJECTIVE: Mood: Anxious and Depressed and Affect: Tearful Risk of harm to self or others: Suicidal ideation No plan to harm self or others No current SI.  SI over 3 weeks ago; [facility] with [facility] after ED visit on 12-29-16. Pt says she is feeling "emotional", but not suicidal today, as her brother's birthday is coming up this week.   LIFE CONTEXT: Family and Social: "couch-surfing" with friends; has 5 grown children, 12 grandchildren; mother, brother(17 months apart, her closest sibling),husband all passed away. Strained relationship with father and two adult children.  School/Work: Lost job, currently unemployed. Receiving services [facility]. Self-Care: Used to cope with stress with marijuana; since being on probation, unable to use. Used to use sleep machine(rain) to sleep, but has not used in over 5 years.  Life Changes: Since husband's suicide 6 years ago, housing instability/homelessness, unemployment, relationship issues(family), on probation, stopped  marijuana use, seeing new therapist and began [place] medication   GOALS ADDRESSED: Patient will reduce symptoms of: anxiety, depression and stress and increase knowledge and/or ability of: self-management skills and also: Increase healthy adjustment to current life circumstances   INTERVENTIONS: Solution-Focused Strategies, Psychoeducation and/or Health Education and Link to [employer]  Standardized Assessments completed: GAD-7 and PHQ 9  ASSESSMENT: Patient currently experiencing Bipolar affective disorder and Grief. Patient may benefit from psychoeducation and brief therapeutic intervention regarding coping with symptoms of anxiety and depression related to bipolar affective disorder.  PLAN: 1. Follow up with behavioral health clinician on : As needed 2. Behavioral recommendations:  -Continue seeing therapist at [facility] with appointment with psychiatry at [place] using services at [facility]; consider [employer] and [employer] as additional [employer] supports -Install sleep app on phone to improve sleep quality; consider additional apps as self-coping strategy  -Counsellor regarding coping with symptoms of anxiety and depression, and medication compliance -Consider indoor walking track/gym at [place] devised previously, if needed 3. Referral(s): [facility] and [employer] Resources:  [facility] [place] indoor walking track/gym 4. "From scale of 1-10, how likely are you to follow plan?": 8  Garlan Fair, LCSWA   Depression screen [facility] 2/9 01/22/2017 11/20/2016 11/06/2016 10/09/2016  Decreased Interest 1 1 1 1   Down, Depressed, Hopeless 2 1 1 2   PHQ - 2 Score 3 2 2 3   Altered sleeping 1 2 1 2   Tired, decreased energy 2 1 1 2   Change in appetite 2 2 0 2  Feeling bad or  failure about yourself  1 1  0 1  Trouble concentrating 3 0 0 0  Moving slowly or fidgety/restless 3 0 0 1  Suicidal thoughts 1 1 0 0  PHQ-9 Score 16 9 4 11    GAD 7 : Generalized Anxiety Score 01/22/2017 11/20/2016 11/06/2016 10/09/2016  Nervous, Anxious, on Edge 3 1 1 1   Control/stop worrying 1 1 0 1  Worry too much - different things 1 1 1 1   Trouble relaxing 2 1 2 1   Restless 3 0 1 0  Easily annoyed or irritable 2 2 1 1   Afraid - awful might happen 0 1 0 0  Total GAD 7 Score 12 7 6  5

## 2017-01-22 NOTE — Patient Instructions (Signed)
DASH Eating Plan DASH stands for "Dietary Approaches to Stop Hypertension." The DASH eating plan is a healthy eating plan that has been shown to reduce high blood pressure (hypertension). It may also reduce your risk for type 2 diabetes, heart disease, and stroke. The DASH eating plan may also help with weight loss. What are tips for following this plan? General guidelines  Avoid eating more than 2,300 mg (milligrams) of salt (sodium) a day. If you have hypertension, you may need to reduce your sodium intake to 1,500 mg a day.  Limit alcohol intake to no more than 1 drink a day for nonpregnant women and 2 drinks a day for men. One drink equals 12 oz of beer, 5 oz of wine, or 1 oz of hard liquor.  Work with your health care provider to maintain a healthy body weight or to lose weight. Ask what an ideal weight is for you.  Get at least 30 minutes of exercise that causes your heart to beat faster (aerobic exercise) most days of the week. Activities may include walking, swimming, or biking.  Work with your health care provider or diet and nutrition specialist (dietitian) to adjust your eating plan to your individual calorie needs. Reading food labels  Check food labels for the amount of sodium per serving. Choose foods with less than 5 percent of the Daily Value of sodium. Generally, foods with less than 300 mg of sodium per serving fit into this eating plan.  To find whole grains, look for the word "whole" as the first word in the ingredient list. Shopping  Buy products labeled as "low-sodium" or "no salt added."  Buy fresh foods. Avoid canned foods and premade or frozen meals. Cooking  Avoid adding salt when cooking. Use salt-free seasonings or herbs instead of table salt or sea salt. Check with your health care provider or pharmacist before using salt substitutes.  Do not fry foods. Cook foods using healthy methods such as baking, boiling, grilling, and broiling instead.  Cook with  heart-healthy oils, such as olive, canola, soybean, or sunflower oil. Meal planning   Eat a balanced diet that includes: ? 5 or more servings of fruits and vegetables each day. At each meal, try to fill half of your plate with fruits and vegetables. ? Up to 6-8 servings of whole grains each day. ? Less than 6 oz of lean meat, poultry, or fish each day. A 3-oz serving of meat is about the same size as a deck of cards. One egg equals 1 oz. ? 2 servings of low-fat dairy each day. ? A serving of nuts, seeds, or beans 5 times each week. ? Heart-healthy fats. Healthy fats called Omega-3 fatty acids are found in foods such as flaxseeds and coldwater fish, like sardines, salmon, and mackerel.  Limit how much you eat of the following: ? Canned or prepackaged foods. ? Food that is high in trans fat, such as fried foods. ? Food that is high in saturated fat, such as fatty meat. ? Sweets, desserts, sugary drinks, and other foods with added sugar. ? Full-fat dairy products.  Do not salt foods before eating.  Try to eat at least 2 vegetarian meals each week.  Eat more home-cooked food and less restaurant, buffet, and fast food.  When eating at a restaurant, ask that your food be prepared with less salt or no salt, if possible. What foods are recommended? The items listed may not be a complete list. Talk with your dietitian about what  dietary choices are best for you. Grains Whole-grain or whole-wheat bread. Whole-grain or whole-wheat pasta. Brown rice. [place]. Bulgur. Whole-grain and low-sodium cereals. Pita bread. Low-fat, low-sodium crackers. Whole-wheat flour tortillas. Vegetables Fresh or frozen vegetables (raw, steamed, roasted, or grilled). Low-sodium or reduced-sodium tomato and vegetable juice. Low-sodium or reduced-sodium tomato sauce and tomato paste. Low-sodium or reduced-sodium canned vegetables. Fruits All fresh, dried, or frozen fruit. Canned fruit in natural juice (without  added sugar). Meat and other protein foods Skinless chicken or [place]. Ground chicken or [place]. Pork with fat trimmed off. Fish and seafood. Egg whites. Dried beans, peas, or lentils. Unsalted nuts, nut butters, and seeds. Unsalted canned beans. Lean cuts of beef with fat trimmed off. Low-sodium, lean deli meat. Dairy Low-fat (1%) or fat-free (skim) milk. Fat-free, low-fat, or reduced-fat cheeses. Nonfat, low-sodium ricotta or cottage cheese. Low-fat or nonfat yogurt. Low-fat, low-sodium cheese. Fats and oils Soft margarine without trans fats. Vegetable oil. Low-fat, reduced-fat, or light mayonnaise and salad dressings (reduced-sodium). Canola, safflower, olive, soybean, and sunflower oils. Avocado. Seasoning and other foods Herbs. Spices. Seasoning mixes without salt. Unsalted popcorn and pretzels. Fat-free sweets. What foods are not recommended? The items listed may not be a complete list. Talk with your dietitian about what dietary choices are best for you. Grains Baked goods made with fat, such as croissants, muffins, or some breads. Dry pasta or rice meal packs. Vegetables Creamed or fried vegetables. Vegetables in a cheese sauce. Regular canned vegetables (not low-sodium or reduced-sodium). Regular canned tomato sauce and paste (not low-sodium or reduced-sodium). Regular tomato and vegetable juice (not low-sodium or reduced-sodium). Angie Fava. Olives. Fruits Canned fruit in a light or heavy syrup. Fried fruit. Fruit in cream or butter sauce. Meat and other protein foods Fatty cuts of meat. Ribs. Fried meat. Berniece Salines. Sausage. Bologna and other processed lunch meats. Salami. Fatback. Hotdogs. Bratwurst. Salted nuts and seeds. Canned beans with added salt. Canned or smoked fish. Whole eggs or egg yolks. Chicken or [place] with skin. Dairy Whole or 2% milk, cream, and half-and-half. Whole or full-fat cream cheese. Whole-fat or sweetened yogurt. Full-fat cheese. Nondairy creamers. Whipped toppings.  Processed cheese and cheese spreads. Fats and oils Butter. Stick margarine. Lard. Shortening. Ghee. Bacon fat. Tropical oils, such as coconut, palm kernel, or palm oil. Seasoning and other foods Salted popcorn and pretzels. Onion salt, garlic salt, seasoned salt, table salt, and sea salt. Worcestershire sauce. Tartar sauce. Barbecue sauce. Teriyaki sauce. Soy sauce, including reduced-sodium. Steak sauce. Canned and packaged gravies. Fish sauce. Oyster sauce. Cocktail sauce. Horseradish that you find on the shelf. Ketchup. Mustard. Meat flavorings and tenderizers. Bouillon cubes. Hot sauce and Tabasco sauce. Premade or packaged marinades. Premade or packaged taco seasonings. Relishes. Regular salad dressings. Where to find more information:  National Heart, Lung, and [place]: https://wilson-eaton.com/  American Heart Association: www.heart.org Summary  The DASH eating plan is a healthy eating plan that has been shown to reduce high blood pressure (hypertension). It may also reduce your risk for type 2 diabetes, heart disease, and stroke.  With the DASH eating plan, you should limit salt (sodium) intake to 2,300 mg a day. If you have hypertension, you may need to reduce your sodium intake to 1,500 mg a day.  When on the DASH eating plan, aim to eat more fresh fruits and vegetables, whole grains, lean proteins, low-fat dairy, and heart-healthy fats.  Work with your health care provider or diet and nutrition specialist (dietitian) to adjust your eating plan to your individual  calorie needs. This information is not intended to replace advice given to you by your health care provider. Make sure you discuss any questions you have with your health care provider. Document Released: 06/15/2011 Document Revised: 06/19/2016 Document Reviewed: 06/19/2016 Elsevier Interactive Patient Education  2017 [place]. Osteoporosis Osteoporosis happens when your bones become thinner and weaker. Weak bones can  break (fracture) more easily when you slip or fall. Bones most at risk of breaking are in the hip, wrist, and spine. Follow these instructions at home:  Get enough calcium and vitamin D. These nutrients are good for your bones.  Exercise as told by your doctor.  Do not use any tobacco products. This includes cigarettes, chewing tobacco, and electronic cigarettes. If you need help quitting, ask your doctor.  Limit the amount of alcohol you drink.  Take medicines only as told by your doctor.  Keep all follow-up visits as told by your doctor. This is important.  Take care at home to prevent falls. Some ways to do this are: ? Keep rooms well lit and tidy. ? Put safety rails on your stairs. ? Put a rubber mat in the bathroom and other places that are often wet or slippery. Get help right away if:  You fall.  You hurt yourself. This information is not intended to replace advice given to you by your health care provider. Make sure you discuss any questions you have with your health care provider. Document Released: 09/18/2011 Document Revised: 12/02/2015 Document Reviewed: 12/04/2013 Elsevier Interactive Patient Education  2018 [place] Maintenance, Female Adopting a healthy lifestyle and getting preventive care can go a long way to promote health and wellness. Talk with your health care provider about what schedule of regular examinations is right for you. This is a good chance for you to check in with your provider about disease prevention and staying healthy. In between checkups, there are plenty of things you can do on your own. Experts have done a lot of research about which lifestyle changes and preventive measures are most likely to keep you healthy. Ask your health care provider for more information. Weight and diet Eat a healthy diet  Be sure to include plenty of vegetables, fruits, low-fat dairy products, and lean protein.  Do not eat a lot of foods high in solid  fats, added sugars, or salt.  Get regular exercise. This is one of the most important things you can do for your health. ? Most adults should exercise for at least 150 minutes each week. The exercise should increase your heart rate and make you sweat (moderate-intensity exercise). ? Most adults should also do strengthening exercises at least twice a week. This is in addition to the moderate-intensity exercise.  Maintain a healthy weight  Body mass index (BMI) is a measurement that can be used to identify possible weight problems. It estimates body fat based on height and weight. Your health care provider can help determine your BMI and help you achieve or maintain a healthy weight.  For females 2 years of age and older: ? A BMI below 18.5 is considered underweight. ? A BMI of 18.5 to 24.9 is normal. ? A BMI of 25 to 29.9 is considered overweight. ? A BMI of 30 and above is considered obese.  Watch levels of cholesterol and blood lipids  You should start having your blood tested for lipids and cholesterol at 49 years of age, then have this test every 5 years.  You may need to  have your cholesterol levels checked more often if: ? Your lipid or cholesterol levels are high. ? You are older than 49 years of age. ? You are at high risk for heart disease.  Cancer screening Lung Cancer  Lung cancer screening is recommended for adults 58-29 years old who are at high risk for lung cancer because of a history of smoking.  A yearly low-dose CT scan of the lungs is recommended for people who: ? Currently smoke. ? Have quit within the past 15 years. ? Have at least a 30-pack-year history of smoking. A pack year is smoking an average of one pack of cigarettes a day for 1 year.  Yearly screening should continue until it has been 15 years since you quit.  Yearly screening should stop if you develop a health problem that would prevent you from having lung cancer treatment.  Breast  Cancer  Practice breast self-awareness. This means understanding how your breasts normally appear and feel.  It also means doing regular breast self-exams. Let your health care provider know about any changes, no matter how small.  If you are in your 20s or 30s, you should have a clinical breast exam (CBE) by a health care provider every 1-3 years as part of a regular health exam.  If you are 13 or older, have a CBE every year. Also consider having a breast X-ray (mammogram) every year.  If you have a family history of breast cancer, talk to your health care provider about genetic screening.  If you are at high risk for breast cancer, talk to your health care provider about having an MRI and a mammogram every year.  Breast cancer gene (BRCA) assessment is recommended for women who have family members with BRCA-related cancers. BRCA-related cancers include: ? Breast. ? Ovarian. ? Tubal. ? Peritoneal cancers.  Results of the assessment will determine the need for genetic counseling and BRCA1 and BRCA2 testing.  Cervical Cancer Your health care provider may recommend that you be screened regularly for cancer of the pelvic organs (ovaries, uterus, and vagina). This screening involves a pelvic examination, including checking for microscopic changes to the surface of your cervix (Pap test). You may be encouraged to have this screening done every 3 years, beginning at age 19.  For women ages 1-65, health care providers may recommend pelvic exams and Pap testing every 3 years, or they may recommend the Pap and pelvic exam, combined with testing for human papilloma virus (HPV), every 5 years. Some types of HPV increase your risk of cervical cancer. Testing for HPV may also be done on women of any age with unclear Pap test results.  Other health care providers may not recommend any screening for nonpregnant women who are considered low risk for pelvic cancer and who do not have symptoms. Ask your  health care provider if a screening pelvic exam is right for you.  If you have had past treatment for cervical cancer or a condition that could lead to cancer, you need Pap tests and screening for cancer for at least 20 years after your treatment. If Pap tests have been discontinued, your risk factors (such as having a new sexual partner) need to be reassessed to determine if screening should resume. Some women have medical problems that increase the chance of getting cervical cancer. In these cases, your health care provider may recommend more frequent screening and Pap tests.  Colorectal Cancer  This type of cancer can be detected and often prevented.  Routine colorectal cancer screening usually begins at 49 years of age and continues through 49 years of age.  Your health care provider may recommend screening at an earlier age if you have risk factors for colon cancer.  Your health care provider may also recommend using home test kits to check for hidden blood in the stool.  A small camera at the end of a tube can be used to examine your colon directly (sigmoidoscopy or colonoscopy). This is done to check for the earliest forms of colorectal cancer.  Routine screening usually begins at age 74.  Direct examination of the colon should be repeated every 5-10 years through 49 years of age. However, you may need to be screened more often if early forms of precancerous polyps or small growths are found.  Skin Cancer  Check your skin from head to toe regularly.  Tell your health care provider about any new moles or changes in moles, especially if there is a change in a mole's shape or color.  Also tell your health care provider if you have a mole that is larger than the size of a pencil eraser.  Always use sunscreen. Apply sunscreen liberally and repeatedly throughout the day.  Protect yourself by wearing long sleeves, pants, a wide-brimmed hat, and sunglasses whenever you are  outside.  Heart disease, diabetes, and high blood pressure  High blood pressure causes heart disease and increases the risk of stroke. High blood pressure is more likely to develop in: ? People who have blood pressure in the high end of the normal range (130-139/85-89 mm Hg). ? People who are overweight or obese. ? People who are African American.  If you are 39-59 years of age, have your blood pressure checked every 3-5 years. If you are 21 years of age or older, have your blood pressure checked every year. You should have your blood pressure measured twice-once when you are at a hospital or clinic, and once when you are not at a hospital or clinic. Record the average of the two measurements. To check your blood pressure when you are not at a hospital or clinic, you can use: ? An automated blood pressure machine at a pharmacy. ? A home blood pressure monitor.  If you are between 74 years and 52 years old, ask your health care provider if you should take aspirin to prevent strokes.  Have regular diabetes screenings. This involves taking a blood sample to check your fasting blood sugar level. ? If you are at a normal weight and have a low risk for diabetes, have this test once every three years after 49 years of age. ? If you are overweight and have a high risk for diabetes, consider being tested at a younger age or more often. Preventing infection Hepatitis B  If you have a higher risk for hepatitis B, you should be screened for this virus. You are considered at high risk for hepatitis B if: ? You were born in a country where hepatitis B is common. Ask your health care provider which countries are considered high risk. ? Your parents were born in a high-risk country, and you have not been immunized against hepatitis B (hepatitis B vaccine). ? You have HIV or AIDS. ? You use needles to inject street drugs. ? You live with someone who has hepatitis B. ? You have had sex with someone who has  hepatitis B. ? You get hemodialysis treatment. ? You take certain medicines for conditions, including cancer, organ  transplantation, and autoimmune conditions.  Hepatitis C  Blood testing is recommended for: ? Everyone born from [birth year] through 1963. ? Anyone with known risk factors for hepatitis C.  Sexually transmitted infections (STIs)  You should be screened for sexually transmitted infections (STIs) including gonorrhea and chlamydia if: ? You are sexually active and are younger than 49 years of age. ? You are older than 49 years of age and your health care provider tells you that you are at risk for this type of infection. ? Your sexual activity has changed since you were last screened and you are at an increased risk for chlamydia or gonorrhea. Ask your health care provider if you are at risk.  If you do not have HIV, but are at risk, it may be recommended that you take a prescription medicine daily to prevent HIV infection. This is called pre-exposure prophylaxis (PrEP). You are considered at risk if: ? You are sexually active and do not regularly use condoms or know the HIV status of your partner(s). ? You take drugs by injection. ? You are sexually active with a partner who has HIV.  Talk with your health care provider about whether you are at high risk of being infected with HIV. If you choose to begin PrEP, you should first be tested for HIV. You should then be tested every 3 months for as long as you are taking PrEP. Pregnancy  If you are premenopausal and you may become pregnant, ask your health care provider about preconception counseling.  If you may become pregnant, take 400 to 800 micrograms (mcg) of folic acid every day.  If you want to prevent pregnancy, talk to your health care provider about birth control (contraception). Osteoporosis and menopause  Osteoporosis is a disease in which the bones lose minerals and strength with aging. This can result in serious bone  fractures. Your risk for osteoporosis can be identified using a bone density scan.  If you are 10 years of age or older, or if you are at risk for osteoporosis and fractures, ask your health care provider if you should be screened.  Ask your health care provider whether you should take a calcium or vitamin D supplement to lower your risk for osteoporosis.  Menopause may have certain physical symptoms and risks.  Hormone replacement therapy may reduce some of these symptoms and risks. Talk to your health care provider about whether hormone replacement therapy is right for you. Follow these instructions at home:  Schedule regular health, dental, and eye exams.  Stay current with your immunizations.  Do not use any tobacco products including cigarettes, chewing tobacco, or electronic cigarettes.  If you are pregnant, do not drink alcohol.  If you are breastfeeding, limit how much and how often you drink alcohol.  Limit alcohol intake to no more than 1 drink per day for nonpregnant women. One drink equals 12 ounces of beer, 5 ounces of wine, or 1 ounces of hard liquor.  Do not use street drugs.  Do not share needles.  Ask your health care provider for help if you need support or information about quitting drugs.  Tell your health care provider if you often feel depressed.  Tell your health care provider if you have ever been abused or do not feel safe at home. This information is not intended to replace advice given to you by your health care provider. Make sure you discuss any questions you have with your health care provider. Document Released: 01/09/2011 Document  Revised: 12/02/2015 Document Reviewed: 03/30/2015 Elsevier Interactive Patient Education  [employer].

## 2017-01-22 NOTE — Progress Notes (Signed)
Patient ID: Katie Sherman, female   DOB: [birth year], 51 y.o.   MRN: [id number]   [facility] her post[facility] Sx.  LMP was in 2014. She has had no PMB. She does report some hot flashes, night sweats and moody swings. Pt was recently sexual active with a new partner without any problems. She also has a H/O Bipolar and depression. Is getting started back on her medication and has been referred to psyc. She has a h/o abnormal pap smear in the past treated with LEEP/CKC. Pap smear 5/18 normal. Has order for mammogram. 1 ppd smoker Was seen in [facility] ER in March and had a + I stat BHCG, repeat last month and was negative.   PE AF VSS Very anxious appearing female in NAD  Lungs clear Heart RRR Abd soft + BS GU deferred  A/P       Postmenopausal Sx    Bipolar/Depression  Pt to see Roselyn Reef today. Reviewed postmenopausal Sx and treatment options with pt. Pt informed that hormonal levels are not indicated at this time. Pt not a candidate for HRT d/t to smoker. Feel that it would be best for pt to get her psych medications adjusted prior to starting and non hormonal options for her PMS. Pt agreed. Pt was given information on healthy female/osteoporisis and DASH diet. To follow up PRN. Spent 30 minutes with pt with over 50 % face to face counseling.

## 2017-01-24 ENCOUNTER — Encounter: Payer: Self-pay | Admitting: Family Medicine

## 2017-01-24 ENCOUNTER — Ambulatory Visit: Payer: No Typology Code available for payment source | Attending: Family Medicine | Admitting: Family Medicine

## 2017-01-24 VITALS — BP 104/67 | HR 70 | Temp 97.9°F | Resp 18 | Ht 67.0 in | Wt 183.2 lb

## 2017-01-24 DIAGNOSIS — M25561 Pain in right knee: Secondary | ICD-10-CM | POA: Insufficient documentation

## 2017-01-24 DIAGNOSIS — F319 Bipolar disorder, unspecified: Secondary | ICD-10-CM | POA: Insufficient documentation

## 2017-01-24 DIAGNOSIS — M25562 Pain in left knee: Secondary | ICD-10-CM | POA: Insufficient documentation

## 2017-01-24 DIAGNOSIS — Z760 Encounter for issue of repeat prescription: Secondary | ICD-10-CM | POA: Insufficient documentation

## 2017-01-24 DIAGNOSIS — M7989 Other specified soft tissue disorders: Secondary | ICD-10-CM | POA: Insufficient documentation

## 2017-01-24 DIAGNOSIS — K219 Gastro-esophageal reflux disease without esophagitis: Secondary | ICD-10-CM | POA: Insufficient documentation

## 2017-01-24 DIAGNOSIS — Z79899 Other long term (current) drug therapy: Secondary | ICD-10-CM | POA: Insufficient documentation

## 2017-01-24 MED ORDER — KNEE BRACE MISC: 1.0000 | Freq: Once | 0 refills | Status: AC

## 2017-01-24 MED ORDER — RANITIDINE HCL 150 MG PO TABS: 150.0000 mg | ORAL_TABLET | Freq: Two times a day (BID) | ORAL | 6 refills | Status: DC | PRN

## 2017-01-24 MED ORDER — LORAZEPAM 0.5 MG PO TABS: 0.5000 mg | ORAL_TABLET | Freq: Four times a day (QID) | ORAL | 0 refills | Status: DC | PRN

## 2017-01-24 MED ORDER — ARIPIPRAZOLE 15 MG PO TABS: 15.0000 mg | ORAL_TABLET | Freq: Every day | ORAL | 0 refills | Status: DC

## 2017-01-24 MED ORDER — TRAZODONE HCL 150 MG PO TABS: 150.0000 mg | ORAL_TABLET | Freq: Every evening | ORAL | 0 refills | Status: DC | PRN

## 2017-01-24 MED ORDER — PRAVASTATIN SODIUM 20 MG PO TABS: 20.0000 mg | ORAL_TABLET | Freq: Every day | ORAL | 2 refills | Status: DC

## 2017-01-24 MED ORDER — MEDICAL COMPRESSION STOCKINGS MISC: 1.0000 | Freq: Once | 0 refills | Status: AC

## 2017-01-24 MED ORDER — OMEGA-3-ACID ETHYL ESTERS 1 G PO CAPS: 1.0000 g | ORAL_CAPSULE | Freq: Two times a day (BID) | ORAL | 2 refills | Status: DC

## 2017-01-24 MED FILL — PRAVASTATIN NA 20 MG TAB: 20 | 30 days supply | Qty: 30 | Fill #0

## 2017-01-24 MED FILL — OMEGA-3 ETHYL ESTERS 1 GM C: 1 | 30 days supply | Qty: 60 | Fill #0

## 2017-01-24 MED FILL — raNITIdine HCL 150 MG TABS: 150 | 15 days supply | Qty: 30 | Fill #0

## 2017-01-24 MED FILL — traZODone HCL 150 MG TABS: 150 | 30 days supply | Qty: 30 | Fill #0

## 2017-01-24 MED FILL — ARIPiprazole 15 MG TABS: 15 | 30 days supply | Qty: 30 | Fill #0

## 2017-01-24 NOTE — Progress Notes (Signed)
Subjective:  Patient ID: Katie Sherman, female    DOB: 20-Aug-1965  Age: 51 y.o. MRN: [id number]  CC: Medication Refill   HPI Katie Sherman presents for complains of arthralgias for which has been present for 2 weeks. Pain is located in both knee(s), is described as aching, and is constant .  Pain is worsened with extension. Associated symptoms include: edema and difficlty bearing weight, and difficulty walking..  The patient has tried NSAIDs for pain, with moderate relief.  Related to injury: No. She does reports history of left knee osteoarthritis.  She also reports episode of intermittent swelling to bilateral feet for several months. She reports history of bilateral swelling 3 to 4 weeks ago with right being greater than left. She bring pictures of the her foot swelling on her cellphone as well. Denies any cardiac symptoms, history of injury, insect bites, or temperature change. History of depression and bipolar disorder. Reports upcoming appointment with [facility] on August 1. She is requesting medication refill until she can establish care.    Outpatient Medications Prior to Visit  Medication Sig Dispense Refill  . gabapentin (NEURONTIN) 100 MG capsule Take 1 capsule (100 mg total) by mouth 3 (three) times daily. For agitation 90 capsule 0  . ibuprofen (ADVIL,MOTRIN) 800 MG tablet Take 1 tablet (800 mg total) by mouth every 8 (eight) hours as needed for moderate pain (Take with food). 30 tablet 0  . nicotine (NICODERM CQ - DOSED IN MG/24 HOURS) 14 mg/24hr patch Place 1 patch (14 mg total) onto the skin daily. For smoking cessation 28 patch 0  . ARIPiprazole (ABILIFY) 15 MG tablet Take 1 tablet (15 mg total) by mouth daily. For mood control 30 tablet 0  . LORazepam (ATIVAN) 0.5 MG tablet Take 1 tablet (0.5 mg total) by mouth every 6 (six) hours as needed for anxiety. 12 tablet 0  . omega-3 acid ethyl esters (LOVAZA) 1 g capsule Take 1 capsule (1 g  total) by mouth 2 (two) times daily. For high cholesterol 30 capsule 0  . pravastatin (PRAVACHOL) 20 MG tablet Take 1 tablet (20 mg total) by mouth daily at 6 PM. For high cholesterol 30 tablet 0  . ranitidine (ZANTAC) 150 MG tablet Take 1 tablet (150 mg total) by mouth 2 (two) times daily as needed for heartburn. 30 tablet 0  . traZODone (DESYREL) 150 MG tablet Take 1 tablet (150 mg total) by mouth at bedtime as needed for sleep. 30 tablet 0   No facility-administered medications prior to visit.     ROS Review of Systems  Constitutional: Negative.   Respiratory: Negative.   Cardiovascular: Positive for leg swelling (right > left).  Gastrointestinal: Negative.   Musculoskeletal: Positive for arthralgias.  Psychiatric/Behavioral: Negative for suicidal ideas.    Objective:  BP 104/67 (BP Location: Left Arm, Patient Position: Sitting, Cuff Size: Normal)   Pulse 70   Temp 97.9 F (36.6 C) (Oral)   Resp 18   Ht '5\' 7"'  (1.702 m)   Wt 183 lb 3.2 oz (83.1 kg)   LMP 04/29/2012   SpO2 100%   BMI 28.69 kg/m   BP/Weight 01/24/2017 01/22/2017 1/94/1740  Systolic BP 814 481 93  Diastolic BP 67 80 60  Wt. (Lbs) 183.2 179 -  BMI 28.69 28.04 -  Some encounter information is confidential and restricted. Go to Review Flowsheets activity to see all data.     Physical Exam  Eyes: Pupils are equal, round, and  reactive to light. Conjunctivae are normal.  Neck: No JVD present.  Cardiovascular: Normal rate, regular rhythm, normal heart sounds and intact distal pulses.   Pulmonary/Chest: Effort normal and breath sounds normal.  Abdominal: Soft. Bowel sounds are normal.  Musculoskeletal:       Right knee: She exhibits bony tenderness.       Left knee: She exhibits bony tenderness.  Bilateral pain with extension  Skin: Skin is warm and dry.     Nursing note and vitals reviewed.    Assessment & Plan:   Problem List Items Addressed This Visit    None    Visit Diagnoses    Arthralgia of  both knees    -  Primary   Relevant Orders   DG Knee Complete 4 Views Left   DG Knee Complete 4 Views Right   Vitamin D, 25-hydroxy (Completed)   Calcium (Completed)   Bilateral swelling of feet          Elastic Bandages & Supports (KNEE BRACE) MISC    Relevant Orders   Ambulatory referral to Vascular Surgery   Medication refill       Relevant Medications   ARIPiprazole (ABILIFY) 15 MG tablet   LORazepam (ATIVAN) 0.5 MG tablet   omega-3 acid ethyl esters (LOVAZA) 1 g capsule   pravastatin (PRAVACHOL) 20 MG tablet   traZODone (DESYREL) 150 MG tablet   Gastroesophageal reflux disease without esophagitis       Relevant Medications   ranitidine (ZANTAC) 150 MG tablet      Meds ordered this encounter  Medications  . ARIPiprazole (ABILIFY) 15 MG tablet    Sig: Take 1 tablet (15 mg total) by mouth daily. For mood control    Dispense:  30 tablet    Refill:  0    Order Specific Question:   Supervising Provider    Answer:   Tresa Garter [id number]  . LORazepam (ATIVAN) 0.5 MG tablet    Sig: Take 1 tablet (0.5 mg total) by mouth every 6 (six) hours as needed for anxiety.    Dispense:  10 tablet    Refill:  0    Order Specific Question:   Supervising Provider    Answer:   Tresa Garter [id number]  . omega-3 acid ethyl esters (LOVAZA) 1 g capsule    Sig: Take 1 capsule (1 g total) by mouth 2 (two) times daily. For high cholesterol    Dispense:  60 capsule    Refill:  2    Order Specific Question:   Supervising Provider    Answer:   Tresa Garter [id number]  . pravastatin (PRAVACHOL) 20 MG tablet    Sig: Take 1 tablet (20 mg total) by mouth daily at 6 PM. For high cholesterol    Dispense:  30 tablet    Refill:  2    Order Specific Question:   Supervising Provider    Answer:   Tresa Garter [id number]  . traZODone (DESYREL) 150 MG tablet    Sig: Take 1 tablet (150 mg total) by mouth at bedtime as needed for sleep.    Dispense:  30 tablet    Refill:  0     Order Specific Question:   Supervising Provider    Answer:   Tresa Garter [id number]  . ranitidine (ZANTAC) 150 MG tablet    Sig: Take 1 tablet (150 mg total) by mouth 2 (two) times daily as needed for heartburn.    Dispense:  30  tablet    Refill:  6    Order Specific Question:   Supervising Provider    Answer:   Tresa Garter [id number]  . Elastic Bandages & Supports (MEDICAL COMPRESSION STOCKINGS) MISC    Sig: 1 kit by Does not apply route once. Apply to bilateral lower legs for swelling. To be fitted by medical supply.    Dispense:  2 each    Refill:  0    Order Specific Question:   Supervising Provider    Answer:   Tresa Garter [id number]  . Elastic Bandages & Supports (KNEE BRACE) MISC    Sig: 1 each by Does not apply route once. Apply to bilateral knee for stability and support. To be fitted by medical supply.    Dispense:  2 each    Refill:  0    Order Specific Question:   Supervising Provider    Answer:   Tresa Garter [id number]    Follow-up: Return in about 3 months (around 04/26/2017), or if symptoms worsen or fail to improve, for HLD.   Alfonse Spruce FNP

## 2017-01-24 NOTE — Progress Notes (Signed)
Patient is here for osteoporosis med management med refill   Patient needs refill on abilify gabapentin  Lorazepam omega pravastatin zantac

## 2017-01-24 NOTE — Patient Instructions (Signed)
Joint Pain Joint pain can be caused by many things. The joint can be bruised, infected, weak from aging, or sore from exercise. The pain will probably go away if you follow your doctor's instructions for home care. If your joint pain continues, more tests may be needed to help find the cause of your condition. Follow these instructions at home: Watch your condition for any changes. Follow these instructions as told to lessen the pain that you are feeling:  Take medicines only as told by your doctor.  Rest the sore joint for as long as told by your doctor. If your doctor tells you to, raise (elevate) the painful joint above the level of your heart while you are sitting or lying down.  Do not do things that cause pain or make the pain worse.  If told, put ice on the painful area: ? Put ice in a plastic bag. ? Place a towel between your skin and the bag. ? Leave the ice on for 20 minutes, 2-3 times per day.  Wear an elastic bandage, splint, or sling as told by your doctor. Loosen the bandage or splint if your fingers or toes lose feeling (become numb) and tingle, or if they turn cold and blue.  Begin exercising or stretching the joint as told by your doctor. Ask your doctor what types of exercise are safe for you.  Keep all follow-up visits as told by your doctor. This is important.  Contact a doctor if:  Your pain gets worse and medicine does not help it.  Your joint pain does not get better in 3 days.  You have more bruising or swelling.  You have a fever.  You lose 10 pounds (4.5 kg) or more without trying. Get help right away if:  You are not able to move the joint.  Your fingers or toes become numb or they turn cold and blue. This information is not intended to replace advice given to you by your health care provider. Make sure you discuss any questions you have with your health care provider. Document Released: 06/14/2009 Document Revised: 12/02/2015 Document Reviewed:  04/07/2014 Elsevier Interactive Patient Education  2018 [employer]  

## 2017-01-25 LAB — CALCIUM: CALCIUM: 9.1 mg/dL (ref 8.7–10.2)

## 2017-01-25 LAB — VITAMIN D 25 HYDROXY (VIT D DEFICIENCY, FRACTURES): Vit D, 25-Hydroxy: 28.8 ng/mL — ABNORMAL LOW (ref 30.0–100.0)

## 2017-01-29 ENCOUNTER — Ambulatory Visit (HOSPITAL_COMMUNITY)
Admission: RE | Admit: 2017-01-29 | Discharge: 2017-01-29 | Disposition: A | Discharge status: Home / Self Care | Payer: No Typology Code available for payment source | Source: Ambulatory Visit | Attending: Family Medicine | Admitting: Family Medicine

## 2017-01-29 DIAGNOSIS — M25561 Pain in right knee: Secondary | ICD-10-CM | POA: Insufficient documentation

## 2017-01-29 DIAGNOSIS — M25562 Pain in left knee: Secondary | ICD-10-CM | POA: Insufficient documentation

## 2017-01-30 ENCOUNTER — Other Ambulatory Visit: Payer: Self-pay

## 2017-01-30 ENCOUNTER — Telehealth: Payer: Self-pay

## 2017-01-30 ENCOUNTER — Other Ambulatory Visit: Payer: Self-pay | Admitting: Family Medicine

## 2017-01-30 DIAGNOSIS — M7989 Other specified soft tissue disorders: Secondary | ICD-10-CM

## 2017-01-30 DIAGNOSIS — M25561 Pain in right knee: Secondary | ICD-10-CM

## 2017-01-30 DIAGNOSIS — E559 Vitamin D deficiency, unspecified: Secondary | ICD-10-CM

## 2017-01-30 DIAGNOSIS — G8929 Other chronic pain: Secondary | ICD-10-CM

## 2017-01-30 DIAGNOSIS — M779 Enthesopathy, unspecified: Secondary | ICD-10-CM

## 2017-01-30 DIAGNOSIS — M25562 Pain in left knee: Secondary | ICD-10-CM

## 2017-01-30 MED ORDER — VITAMIN D (ERGOCALCIFEROL) 1.25 MG (50000 UNIT) PO CAPS: 50000.0000 [IU] | ORAL_CAPSULE | ORAL | 0 refills | Status: AC

## 2017-01-30 MED FILL — VIT D2 1.25 MG (50,000 UNIT: 1.25 MG | 28 days supply | Qty: 4 | Fill #0

## 2017-01-30 NOTE — Telephone Encounter (Signed)
-----   Message from Alfonse Spruce, [place] sent at 01/30/2017  2:03 PM EDT ----- Xray of right knee showed no evidence of fracture, dislocation, or joint swelling. Mild bone spurring was seen. Xray of left knee showed no evidence of fracture, dislocation, or joint swelling. You will be referred to orthopedics Vitamin D level was low. Vitamin D helps to keep bones strong. You were prescribed ergocalciferol (capsules) to increase your vitamin-d level. Once finished start taking OTC vitamin d supplement with 400 international units (IU) of vitamin-d per day.  Calcium levels were normal. Good sources of calcium include dairy products.

## 2017-01-30 NOTE — Telephone Encounter (Signed)
CMA call regarding lab results   Patient verify DOB  Patient was aware and understood  

## 2017-02-06 MED FILL — raNITIdine HCL 150 MG TABS: 150 | 15 days supply | Qty: 30 | Fill #1

## 2017-02-08 MED FILL — GABAPENTIN 100 MG CAPSULE: 100 | 30 days supply | Qty: 90 | Fill #0

## 2017-02-08 MED FILL — ARIPiprazole 20 MG TABS: 20 | 30 days supply | Qty: 30 | Fill #0

## 2017-02-08 MED FILL — traZODone HCL 100 MG TABS: 100 | 30 days supply | Qty: 45 | Fill #0

## 2017-02-21 ENCOUNTER — Ambulatory Visit (INDEPENDENT_AMBULATORY_CARE_PROVIDER_SITE_OTHER): Payer: Self-pay | Admitting: Family Medicine

## 2017-02-21 ENCOUNTER — Encounter: Payer: Self-pay | Admitting: Family Medicine

## 2017-02-21 VITALS — BP 110/70 | Ht 66.0 in | Wt 180.0 lb

## 2017-02-21 DIAGNOSIS — M25561 Pain in right knee: Secondary | ICD-10-CM

## 2017-02-21 DIAGNOSIS — M7502 Adhesive capsulitis of left shoulder: Secondary | ICD-10-CM

## 2017-02-21 MED ORDER — METHYLPREDNISOLONE ACETATE 40 MG/ML IJ SUSP
40.0000 mg | Freq: Once | INTRAMUSCULAR | Status: AC
  Administered 2017-02-21: 40 mg via INTRA_ARTICULAR

## 2017-02-21 NOTE — Progress Notes (Signed)
Chief complaint: Follow-up left shoulder pain 8 months, new symptom of right knee pain 2 months  History of present illness: Katie Sherman is a 51 year old female who presents to the sports medicine office today for follow-up of left shoulder pain. She also reports new concern today of right knee pain. This is my first time meeting patient here today.  She was seen here in the office back on 11/17/16 for left shoulder pain, was diagnosed with adhesive capsulitis. She reports that she started to have symptoms of decreased mobility and range of motion in her left shoulder. She does not report of any inciting incident, trauma, or injury to cause this decreased mobility and pain. She describes having a deep aching pain, reports pain in the posterolateral shoulder. She reports that she was evaluated in the emergency department back in March for these symptoms, in addition to having symptoms of near syncope, was told to follow-up the sports medicine office for her shoulder issues. She did have x-ray of her left shoulder at that time, was told she does not have any acute bony abnormality, no fracture or dislocation was seen, no significant soft tissue abnormalities were seen. At the office visit here in the sports medicine center back in May she was offered cortisone injection the left glenohumeral joint. She did agree to this and reports that she did have symptom relief for approximately 2.5 months. She reports that symptoms returned approximately 2 weeks ago. She reports that she was offered physical therapy but she tried to do physical therapy on her own given that she did have improvement in her range of motion and pain relief with just a cortisone injection alone. She reports mainly having pain with shoulder abduction and shoulder internal range of motion. She reports that she is not able to strap on her bra, which she normally uses with her left arm and shoulder. She does not report of any numbness, tingling, or  weakness otherwise in her left upper extremity. She is right hand dominant.  She reports today a new problem of right knee pain. She reports that symptoms have been bothering her for close to 2 months now. She reports pain along the inferomedial aspect of right knee, along the inferior pole of patella. She does report of popping and sensation of giving way. 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 She reports of intermittent effusion, but otherwise no warmth, erythema, or ecchymosis. She does not report of any previous right knee injury. She does not report of any numbness or tingling in right lower extremity. She does not report of any weakness in right lower extremity. She describes the pain as a throbbing pain, does not report of any other aggravating factors other than going up and down stairs. She is not report of any radiation of pain. She reports pain today being 3-4/10 pain. He did recently get x-rays of her knee back on 01/29/17, this was ordered by her primary physician. The radiologist reported no evidence of fracture, dislocation, or joint effusion, but very mobile spurring noted from the superior aspect of patella. She has otherwise not used any medications for pain.  Review of systems:  As stated above  Physical exam: Vital signs are reviewed and are documented in the chart Gen.: Alert, oriented, appears stated age, in no apparent distress HEENT: Moist oral mucosa Respiratory: Normal respirations, able to speak in  full sentences Cardiac: Regular rate, distal pulses 2+ Integumentary: No rashes on visible skin:  Neurologic: Strength 5/5 in bilateral upper and lower extremities, sensation 2+ in bilateral upper and lower extremities Psych: Normal affect, mood is described as good Musculoskeletal: Inspection of left shoulder reveals no obvious deformity or muscular atrophy,  no warmth, erythema, ecchymosis, or effusion noted, no tenderness to palpation over clavicle, acromioclavicular joint, acromion, coracoid process, head of biceps, scapular spine, and cervical spine, range of motion today on the left side is 90 of abduction, external range of motion to 45, internal range of motion only to 10 flexion is also to 90, impingement testing negative, Neer, Hawkins, and cross arm negative; inspection of right knee reveals no obvious deformity or muscular atrophy, no warmth, erythema, ecchymosis, or effusion noted, she is tender to palpation along medial joint line, no tenderness to palpation over quadriceps tendon, patellar tendon, lateral joint line, Lachman, anterior drawer, valgus, and varus stress testing negative, McMurray positive for pain, negative for crepitus   Procedure note: Prior to procedure, written informed consent was obtained, outlining benefits and risks, with risks including bleeding, infection, and steroid flare. After informed consent signed by the patient, Betadine was used to clean off the skin. Alcohol was additionally used. Using 25-gauge needle 3-1 mixture of lidocaine and Depo-Medrol was used to inject the right knee from inferolateral approach, with injection site in the lateral joint space. No complications were noted. Band-Aid was applied afterward. Patient was observed for 5-10 minutes following injection.   Assessment and plan: 1. Left shoulder pain and stiffness, secondary to adhesive capsulitis 2. 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  Left shoulder pain -Discussed this is still sequela from adhesive capsulitis -I discussed with her that it is too soon to repeat cortisone injection, especially given the fact that she does not have any evidence of any obvious osteoarthritic changes seen in the acromioclavicular joint and glenohumeral joint, I did personally review x-ray imaging of her left shoulder from March  2018, I do not see any signs of osteoarthritis, I discussed that if she is given too much cortisone injection she can increase her risk and speed up process of osteoarthritis -I discussed that we could repeat cortisone injection in 4-6 weeks, but she would not be offered any future cortisone injections after that time frame unless she has new x-ray findings suggestive of osteoarthritis -I discussed continuing with range of motion exercises, discussed formal physical therapy if she does not have any interval improvement in her symptoms -I discussed expectation of timeframe of relief of symptoms, typically around 12-18 months, also discussed that she does have slight improvement in her range of motion in comparison to previous office visit, so did give her reassurance that she is somewhat improving  Right knee pain -I personally reviewed x-rays from last month, doesn't look like she does have mild medial compartment osteoarthritis on the right side -I discussed different options today, including home exercise program, formal physical therapy, cortisone injection, and MRI, after shared decision-making she does agree to cortisone injection to the right knee today, with home exercise program -There are no complications from cortisone injection -If she does not have any interval improvement in symptoms, could consider MRI of her right knee -She also requests knee sleeve today with her stability, this was given to her  She will return here in the office in 2 weeks, she would like to have repeat of right greater trochanteric bursal injection for history of right greater  trochanter bursitis. She will follow-up for her knee pain and shoulder pain/stiffness in 4-6 weeks or sooner as needed.   Mort Sawyers, M.D. [place]

## 2017-02-22 ENCOUNTER — Telehealth: Payer: Self-pay | Admitting: Family Medicine

## 2017-02-22 ENCOUNTER — Other Ambulatory Visit: Payer: Self-pay | Admitting: Family Medicine

## 2017-02-22 DIAGNOSIS — G8929 Other chronic pain: Secondary | ICD-10-CM

## 2017-02-22 DIAGNOSIS — M25512 Pain in left shoulder: Principal | ICD-10-CM

## 2017-02-22 MED FILL — VIT D2 1.25 MG (50,000 UNIT: 1.25 MG | 28 days supply | Qty: 4 | Fill #1

## 2017-02-22 MED FILL — PRAVASTATIN NA 20 MG TAB: 20 | 30 days supply | Qty: 30 | Fill #1

## 2017-02-22 MED FILL — IBUPROFEN 800 MG TABLET: 800 | 10 days supply | Qty: 30 | Fill #0

## 2017-02-22 MED FILL — OMEGA-3 ETHYL ESTERS 1 GM C: 1 | 30 days supply | Qty: 60 | Fill #1

## 2017-02-22 NOTE — Telephone Encounter (Signed)
Pt. Called requesting a refill on ibuprofen (ADVIL,MOTRIN) 800 MG tablet Pt. Uses [facility]. Please f/u with pt.

## 2017-02-22 NOTE — Telephone Encounter (Signed)
Refilled #30, patient must have office visit for further refills.

## 2017-03-01 ENCOUNTER — Telehealth: Payer: Self-pay | Admitting: Family Medicine

## 2017-03-01 MED ORDER — NICOTINE 14 MG/24HR TD PT24
14.0000 mg | MEDICATED_PATCH | Freq: Every day | TRANSDERMAL | 1 refills | Status: DC
Start: 2017-03-01 — End: 2017-07-13

## 2017-03-01 MED FILL — raNITIdine HCL 150 MG TABS: 150 | 15 days supply | Qty: 30 | Fill #2

## 2017-03-01 NOTE — Telephone Encounter (Signed)
Pt called to request a refill for  nicotine (NICODERM CQ - DOSED IN MG/24 HOURS) 14 mg/24hr patch   She was wondering if you can order 2 boxes for her Please follow up

## 2017-03-01 NOTE — Telephone Encounter (Signed)
Refilled nicotine patches.

## 2017-03-07 ENCOUNTER — Ambulatory Visit (INDEPENDENT_AMBULATORY_CARE_PROVIDER_SITE_OTHER): Payer: Self-pay | Admitting: Family Medicine

## 2017-03-07 VITALS — BP 140/90 | Ht 66.0 in | Wt 180.0 lb

## 2017-03-07 DIAGNOSIS — L03119 Cellulitis of unspecified part of limb: Secondary | ICD-10-CM

## 2017-03-07 DIAGNOSIS — M7061 Trochanteric bursitis, right hip: Secondary | ICD-10-CM

## 2017-03-07 MED ORDER — CEPHALEXIN 500 MG PO CAPS: ORAL_CAPSULE | ORAL | 0 refills | Status: DC

## 2017-03-07 MED FILL — CEPHALEXIN 500 MG CAPSULE: 500 | 7 days supply | Qty: 21 | Fill #0

## 2017-03-07 NOTE — Patient Instructions (Signed)
Keflex (Cephalexin) has been sent to your pharmacy. Take this medication at morning, lunch, and dinner-time for 7 days to treat the infection in your legs. Will plan for cortisone injection into your hip next week if symptoms resolve.

## 2017-03-07 NOTE — Progress Notes (Signed)
Chief complaint: Acute on chronic right hip pain 3 months  History of present illness: Katie Sherman is a 51 year old female who presents to the sports medicine office today for acute on chronic right hip pain. She has been progressively bothering her for the last 3 months now. She has been diagnosed with right greater trochanteric bursitis, received cortisone injection into the hip back in May 2018. Today's appointment was scheduled for repeat cortisone injection to the greater trochanter bursa, as she does have pain specifically over the greater trochanter bursa. She reports that 4-5 days ago she was visiting a family member, stayed over there through the weekend, reports that the house was flea infested, she started to get itching, burning, and increased pain in both of her lower extremities and feet. She reports that additionally she shaved her legs this morning, reports mainly having a burning pain in her lower extremities now. She has not had any itching over the last few days. She does not report of any fevers, chills, or night sweats. She also reports of bilateral lower extremity swelling, reports that she does have appointment with vascular surgeon tomorrow. She does have an old tattoo on the lateral aspect of her right lower leg, reports that a few days after having the tattoo placed she did shave her legs and developed a "staph infection". She does not report of any numbness, tingling, or weakness in any of her lower extremities. She reports that the cortisone injection into her knee has helped.   Interval past medical history, surgical history, family history, and social history reviewed, unchanged from last office visit.  Review of systems:  As stated above  Physical exam: Vital signs are reviewed and are documented in the chart Gen.: Alert, oriented, appears stated age, in no apparent distress HEENT: Moist oral mucosa Respiratory: Normal respirations, able to speak in full  sentences Cardiac: Regular rate, distal pulses 2+ Integumentary: She does have scattered areas of erythema and scattered excoriation in her bilateral lower extremities, mainly in the distal fourth of her legs, not affecting her feet or proximal legs, slight warmth, no purulence, pustules, or bleeding Neurologic: Strength 5/5, sensation 2+ in bilateral lower extremities Psych: Normal affect, mood is described as good Musculoskeletal: Inspection of right hip reveals no obvious bony abnormality or muscular atrophy, no warmth, erythema, ecchymosis, or effusion noted, she is tender to palpation over right greater trochanter bursa, she does have approximately 45 of hip external and internal range of motion, limited secondary to pain  Assessment and plan: 1. Concern for early skin and soft tissue infection of bilateral lower extremities 2. Right greater trochanteric bursitis  Skin and soft tissue infection -Ultimately discussed with patient my concern about developing early cellulitis in her bilateral lower extremities, discussed my concern about steroid injection to the greater trochanter bursa, as well as possibility of systemic steroid for treatment the hip as well as the burning pain in her lower extremities, discussed immunosuppressive properties to steroids -Ultimately, discussed will defer cortisone injection into the right greater trochanteric bursa today -Will place patient on antibiotics, will place patient on Keflex 500 mg 3 times a day 7 days, to treat for MSSA and Streptococcus, does not have appearance or concern for MRSA  Will have patient follow-up next week, if infection has cleared could consider cortisone injection to the right greater trochanter bursa.  Of note, she does specifically requests for work note today. I asked for more clarification of this work note, she does request specific work note detailing  her medical conditions for which I'm treating her for. She does not want a  work note to be excused for today or any work limitation notes. She reports that she wants to get this note to her boss. She does give me permission to explicitely state the medical conditions for which I'm treating her for.   Mort Sawyers, M.D. [place]

## 2017-03-08 ENCOUNTER — Encounter: Payer: No Typology Code available for payment source | Admitting: Vascular Surgery

## 2017-03-08 ENCOUNTER — Inpatient Hospital Stay (HOSPITAL_COMMUNITY): Admission: RE | Admit: 2017-03-08 | Payer: Self-pay | Source: Ambulatory Visit

## 2017-03-08 ENCOUNTER — Emergency Department (HOSPITAL_COMMUNITY)
Admission: EM | Admit: 2017-03-08 | Discharge: 2017-03-08 | Disposition: A | Discharge status: Home / Self Care | Payer: No Typology Code available for payment source | Attending: Emergency Medicine | Admitting: Emergency Medicine

## 2017-03-08 ENCOUNTER — Emergency Department (HOSPITAL_COMMUNITY): Payer: No Typology Code available for payment source

## 2017-03-08 DIAGNOSIS — Z79899 Other long term (current) drug therapy: Secondary | ICD-10-CM | POA: Insufficient documentation

## 2017-03-08 DIAGNOSIS — Z7902 Long term (current) use of antithrombotics/antiplatelets: Secondary | ICD-10-CM | POA: Insufficient documentation

## 2017-03-08 DIAGNOSIS — L03116 Cellulitis of left lower limb: Secondary | ICD-10-CM | POA: Insufficient documentation

## 2017-03-08 DIAGNOSIS — F17210 Nicotine dependence, cigarettes, uncomplicated: Secondary | ICD-10-CM | POA: Insufficient documentation

## 2017-03-08 DIAGNOSIS — L03115 Cellulitis of right lower limb: Secondary | ICD-10-CM

## 2017-03-08 DIAGNOSIS — Z8541 Personal history of malignant neoplasm of cervix uteri: Secondary | ICD-10-CM | POA: Insufficient documentation

## 2017-03-08 LAB — CBC WITH DIFFERENTIAL/PLATELET
BASOS ABS: 0.1 10*3/uL (ref 0.0–0.1)
Basophils Relative: 0 %
Eosinophils Absolute: 0.1 10*3/uL (ref 0.0–0.7)
Eosinophils Relative: 0 %
HEMATOCRIT: 39.9 % (ref 36.0–46.0)
HEMOGLOBIN: 13.3 g/dL (ref 12.0–15.0)
LYMPHS PCT: 13 %
Lymphs Abs: 2.1 10*3/uL (ref 0.7–4.0)
MCH: 31.1 pg (ref 26.0–34.0)
MCHC: 33.3 g/dL (ref 30.0–36.0)
MCV: 93.4 fL (ref 78.0–100.0)
MONO ABS: 1 10*3/uL (ref 0.1–1.0)
MONOS PCT: 6 %
NEUTROS ABS: 13.1 10*3/uL — AB (ref 1.7–7.7)
NEUTROS PCT: 81 %
Platelets: 361 10*3/uL (ref 150–400)
RBC: 4.27 MIL/uL (ref 3.87–5.11)
RDW: 12.7 % (ref 11.5–15.5)
WBC: 16.2 10*3/uL — ABNORMAL HIGH (ref 4.0–10.5)

## 2017-03-08 LAB — I-STAT TROPONIN, ED: Troponin i, poc: 0 ng/mL (ref 0.00–0.08)

## 2017-03-08 LAB — COMPREHENSIVE METABOLIC PANEL
ALBUMIN: 4.1 g/dL (ref 3.5–5.0)
ALT: 14 U/L (ref 14–54)
ANION GAP: 10 (ref 5–15)
AST: 21 U/L (ref 15–41)
Alkaline Phosphatase: 78 U/L (ref 38–126)
BUN: 16 mg/dL (ref 6–20)
CO2: 21 mmol/L — AB (ref 22–32)
Calcium: 9.1 mg/dL (ref 8.9–10.3)
Chloride: 104 mmol/L (ref 101–111)
Creatinine, Ser: 1.02 mg/dL — ABNORMAL HIGH (ref 0.44–1.00)
GFR calc non Af Amer: >60 mL/min (ref >60)
GLUCOSE: 128 mg/dL — AB (ref 65–99)
POTASSIUM: 3.5 mmol/L (ref 3.5–5.1)
SODIUM: 135 mmol/L (ref 135–145)
TOTAL PROTEIN: 7.6 g/dL (ref 6.5–8.1)
Total Bilirubin: 0.8 mg/dL (ref 0.3–1.2)

## 2017-03-08 MED ORDER — CLINDAMYCIN HCL 150 MG PO CAPS: 300.0000 mg | ORAL_CAPSULE | Freq: Three times a day (TID) | ORAL | 0 refills | Status: AC

## 2017-03-08 MED ORDER — OXYCODONE-ACETAMINOPHEN 5-325 MG PO TABS
1.0000 | ORAL_TABLET | Freq: Once | ORAL | Status: AC
  Administered 2017-03-08: 1 via ORAL

## 2017-03-08 MED ORDER — OXYCODONE-ACETAMINOPHEN 5-325 MG PO TABS
ORAL_TABLET | ORAL | Status: AC
  Filled 2017-03-08: qty 1

## 2017-03-08 MED ORDER — SODIUM CHLORIDE 0.9 % IV BOLUS (SEPSIS): 1000.0000 mL | Freq: Once | INTRAVENOUS | Status: DC

## 2017-03-08 MED ORDER — KETOROLAC TROMETHAMINE 30 MG/ML IJ SOLN
30.0000 mg | Freq: Once | INTRAMUSCULAR | Status: DC
  Filled 2017-03-08: qty 1

## 2017-03-08 MED ORDER — HYDROCODONE-ACETAMINOPHEN 5-325 MG PO TABS: 1.0000 | ORAL_TABLET | Freq: Four times a day (QID) | ORAL | 0 refills | Status: DC | PRN

## 2017-03-08 MED ORDER — VANCOMYCIN HCL IN DEXTROSE 1-5 GM/200ML-% IV SOLN
1000.0000 mg | Freq: Two times a day (BID) | INTRAVENOUS | Status: DC
  Filled 2017-03-08: qty 200

## 2017-03-08 MED FILL — CLINDAMYCIN HCL 150 MG CAPS: 150 | 7 days supply | Qty: 42 | Fill #0

## 2017-03-08 NOTE — Progress Notes (Signed)
Pharmacy Antibiotic Note  Courtnei Ruddell is a 51 y.o. female admitted on 03/08/2017 with cellulitis.  Pharmacy has been consulted for vancomycin dosing. Pt is afebrile but WBC is elevated at 16.2. Scr is WNL at 1.02. She was recently started on keflex without improvement.   Plan: Vancomycin 1gm IV Q12H F/u renal fxn, C&S, clinical status and trough at SS     Temp (24hrs), Avg:98.8 F (37.1 C), Min:98.8 F (37.1 C), Max:98.8 F (37.1 C)   Recent Labs Lab 03/08/17 0719  WBC 16.2*  CREATININE 1.02*    Estimated Creatinine Clearance: 71.8 mL/min (A) (by C-G formula based on SCr of 1.02 mg/dL (H)).    No Known Allergies  Antimicrobials this admission: Vanc 8/30>>  Dose adjustments this admission: N/A  Microbiology results: Pending  Thank you for allowing pharmacy to be a part of this patient's care.  Jorma Tassinari, Rande Lawman 03/08/2017 9:08 AM

## 2017-03-08 NOTE — ED Notes (Signed)
Dr. Little at bedside.  

## 2017-03-08 NOTE — ED Notes (Signed)
Pt provided with [employer].

## 2017-03-08 NOTE — ED Notes (Signed)
Got patient into a gown on the monitor patient is resting with call bell in reach 

## 2017-03-08 NOTE — ED Provider Notes (Addendum)
[facility] DEPT Provider Note   CSN: [id number] Arrival date & time: 03/08/17  0551     History   Chief Complaint Chief Complaint  Patient presents with  . Cellulitis    HPI Katie Sherman is a 51 y.o. female.  51yo F w/ PMH including bipolar d/o, cervical CA, kidney stones, homelessness who p/w leg rash. Yesterday she began having redness, itching, and burning of her lower legs. She went to her orthopedic doctor yesterday for routine follow up and told him about her legs; he started her on keflex for cellulitis. She has had 3 doses of keflex but the burning and pain has been worse today and she has had b/l foot swelling.  She reports staying with a friend recently who had fleas and she got a lot of flea bites. She tried aloe on her skin with no relief. No fevers, vomiting, or recent illness. No other areas of rash. No chest pain or SOB, no cough. She does note she is homeless and has to walk a lot.   The history is provided by the patient.    Past Medical History:  Diagnosis Date  . Cervical cancer ([place])   . Cervical cancer ([place])   . History of exercise stress test    ETT-Echo 5/18: normal EF, no ischemia  . History of loop recorder    currently on person  . Kidney stone     Patient Active Problem List   Diagnosis Date Noted  . Postmenopausal 01/22/2017  . Bipolar 1 disorder, depressed ([place]) 12/29/2016  . Family history of suicide 11/20/2016  . Depression 11/20/2016  . Family history of breast cancer 11/20/2016  . Adhesive capsulitis of left shoulder 11/19/2016  . Greater trochanteric bursitis of right hip 11/19/2016    Past Surgical History:  Procedure Laterality Date  . CERVICAL CONE BIOPSY    . TUBAL LIGATION      OB History    Gravida Para Term Preterm AB Living   6 5 5   1 5    SAB TAB Ectopic Multiple Live Births     1     5       Home Medications    Prior to Admission medications   Medication Sig Start Date End Date Taking? Authorizing  Provider  ARIPiprazole (ABILIFY) 15 MG tablet Take 1 tablet (15 mg total) by mouth daily. For mood control 01/24/17   Fredia Beets R, FNP  ARIPiprazole (ABILIFY) 20 MG tablet Take 20 mg by mouth daily. 02/08/17   [provider]  cephALEXin (KEFLEX) 500 MG capsule Take 3 times daily for 7 days. 03/07/17   Lake, Christoper P, MD  clindamycin (CLEOCIN) 150 MG capsule Take 2 capsules (300 mg total) by mouth 3 (three) times daily. 03/08/17 03/15/17  Sachi Boulay, Wenda Overland, MD  gabapentin (NEURONTIN) 100 MG capsule Take 1 capsule (100 mg total) by mouth 3 (three) times daily. For agitation 01/04/17   Lindell Spar I, NP  HYDROcodone-acetaminophen (NORCO/VICODIN) 5-325 MG tablet Take 1-2 tablets by mouth every 6 (six) hours as needed for severe pain. 03/08/17   Haadi Santellan, Wenda Overland, MD  ibuprofen (ADVIL,MOTRIN) 800 MG tablet TAKE 1 TABLET (800 MG TOTAL) BY MOUTH EVERY 8 (EIGHT) HOURS AS NEEDED FOR MODERATE PAIN (TAKE WITH FOOD). 02/22/17   Alfonse Spruce, FNP  LORazepam (ATIVAN) 0.5 MG tablet Take 1 tablet (0.5 mg total) by mouth every 6 (six) hours as needed for anxiety. 01/24/17   Alfonse Spruce, FNP  nicotine (NICODERM CQ -  DOSED IN MG/24 HOURS) 14 mg/24hr patch Place 1 patch (14 mg total) onto the skin daily. For smoking cessation 03/01/17   Alfonse Spruce, FNP  omega-3 acid ethyl esters (LOVAZA) 1 g capsule Take 1 capsule (1 g total) by mouth 2 (two) times daily. For high cholesterol 01/24/17   Alfonse Spruce, FNP  pravastatin (PRAVACHOL) 20 MG tablet Take 1 tablet (20 mg total) by mouth daily at 6 PM. For high cholesterol 01/24/17   Alfonse Spruce, FNP  ranitidine (ZANTAC) 150 MG tablet Take 1 tablet (150 mg total) by mouth 2 (two) times daily as needed for heartburn. 01/24/17   Alfonse Spruce, FNP  traZODone (DESYREL) 100 MG tablet TAKE 1 1 2  TABLET BY MOUTH AT BEDTIME 02/08/17   [provider]  traZODone (DESYREL) 150 MG tablet Take 1 tablet (150 mg total)  by mouth at bedtime as needed for sleep. 01/24/17   Alfonse Spruce, FNP  Vitamin D, Ergocalciferol, (DRISDOL) 50000 units CAPS capsule Take 1 capsule (50,000 Units total) by mouth every 7 (seven) days. 01/30/17 03/21/17  Alfonse Spruce, FNP    Family History Family History  Problem Relation Age of Onset  . Heart attack Mother 83  . Heart disease Father   . Heart attack Father 66    Social History Social History  Substance Use Topics  . Smoking status: Current Every Day Smoker    Packs/day: 1.00    Years: 36.00    Types: Cigarettes    Start date: 64  . Smokeless tobacco: Never Used  . Alcohol use No     Allergies   Patient has no known allergies.   Review of Systems Review of Systems All other systems reviewed and are negative except that which was mentioned in HPI   Physical Exam Updated Vital Signs BP 120/84   Pulse 83   Temp 98.3 F (36.8 C) (Oral)   Resp 16   LMP 04/29/2012   SpO2 100%   Physical Exam  Constitutional: She is oriented to person, place, and time. She appears well-developed and well-nourished. No distress.  uncomfortable  HENT:  Head: Normocephalic and atraumatic.  Moist mucous membranes  Eyes: Pupils are equal, round, and reactive to light. Conjunctivae are normal.  Neck: Neck supple.  Cardiovascular: Normal rate, regular rhythm, normal heart sounds and intact distal pulses.   No murmur heard. Pulmonary/Chest: Effort normal and breath sounds normal.  Abdominal: Soft. Bowel sounds are normal. She exhibits no distension. There is no tenderness.  Musculoskeletal: She exhibits edema and tenderness.  Mild swelling of ankles and b/l dorsal feet  Neurological: She is alert and oriented to person, place, and time.  Fluent speech, normal gait  Skin: Skin is warm and dry. Rash noted.  Erythema, warmth of b/l anterior lower legs and small area on dorsal L foot, tender to palpation  Psychiatric: Judgment normal.  Anxious  Nursing note  and vitals reviewed.      ED Treatments / Results  Labs (all labs ordered are listed, but only abnormal results are displayed) Labs Reviewed  COMPREHENSIVE METABOLIC PANEL - Abnormal; Notable for the following:       Result Value   CO2 21 (*)    Glucose, Bld 128 (*)    Creatinine, Ser 1.02 (*)    All other components within normal limits  CBC WITH DIFFERENTIAL/PLATELET - Abnormal; Notable for the following:    WBC 16.2 (*)    Neutro Abs 13.1 (*)  All other components within normal limits  URINALYSIS, ROUTINE W REFLEX MICROSCOPIC  I-STAT CG4 LACTIC ACID, ED    EKG  EKG Interpretation None       Radiology Dg Chest 2 View  Result Date: 03/08/2017 CLINICAL DATA:  Lower extremity cellulitis. EXAM: CHEST  2 VIEW COMPARISON:  10/04/2016. FINDINGS: Mediastinum and hilar structures normal. Lungs are clear. Heart size normal. No pleural effusion or pneumothorax. IMPRESSION: No acute cardiopulmonary disease. Electronically Signed   By: Marcello Moores  Register   On: 03/08/2017 07:40    Procedures Procedures (including critical care time)  Medications Ordered in ED Medications  oxyCODONE-acetaminophen (PERCOCET/ROXICET) 5-325 MG per tablet (not administered)  oxyCODONE-acetaminophen (PERCOCET/ROXICET) 5-325 MG per tablet 1 tablet (1 tablet Oral Given 03/08/17 0354)     Initial Impression / Assessment and Plan / ED Course  I have reviewed the triage vital signs and the nursing notes.  Pertinent labs & imaging results that were available during my care of the patient were reviewed by me and considered in my medical decision making (see chart for details).     Pt w/ redness, burning, and pain of b/l lower legs Since yesterday, history of multiple flea bites recently at friend's house. Has been taking Keflex since yesterday with no improvement. She was uncomfortable but nontoxic on exam with normal vital signs, afebrile. Findings as above, concerning for cellulitis. Lab work here shows  glucose 128, BUNs 16, creatinine 1.02, WBC 16.2. No evidence of dehydration or severe hyperglycemia. She denies any history of diabetes. I recommended brought her coverage with a medication such as clindamycin. I initially ordered a dose of IV vancomycin, toradol, and IV fluids. I was called to the patient's room because she stated she wanted to leave. She stated she did not want to stay to receive this treatment but just wanted a prescription and discharge. I counseled her on supportive measures and extensively reviewed return precautions with her, instructing her to return to ER if she has any worsening redness, fevers, or any other worsening symptoms. Patient voiced understanding and was discharged in satisfactory condition.  Final Clinical Impressions(s) / ED Diagnoses   Final diagnoses:  Bilateral lower leg cellulitis    New Prescriptions New Prescriptions   CLINDAMYCIN (CLEOCIN) 150 MG CAPSULE    Take 2 capsules (300 mg total) by mouth 3 (three) times daily.   HYDROCODONE-ACETAMINOPHEN (NORCO/VICODIN) 5-325 MG TABLET    Take 1-2 tablets by mouth every 6 (six) hours as needed for severe pain.     Tamiki Kuba, Wenda Overland, MD 03/08/17 [id number]    Rex Kras Wenda Overland, MD 03/08/17 [phone]

## 2017-03-08 NOTE — ED Notes (Signed)
Dr. Rex Kras at bedside with this RN.

## 2017-03-08 NOTE — ED Triage Notes (Addendum)
Cellulitis to bilateral lower legs since yesterday dxed yesterday orthopedic MD and given keflex. Bilateral redness and swelling noted.  Pt given narcotic pain medicine in triage. Advised of side effects and instructed to avoid driving for a minimum of four hours.

## 2017-03-14 ENCOUNTER — Other Ambulatory Visit: Payer: Self-pay | Admitting: Family Medicine

## 2017-03-14 ENCOUNTER — Ambulatory Visit (INDEPENDENT_AMBULATORY_CARE_PROVIDER_SITE_OTHER): Payer: Self-pay | Admitting: Family Medicine

## 2017-03-14 VITALS — BP 134/90 | Ht 66.0 in | Wt 170.0 lb

## 2017-03-14 DIAGNOSIS — R600 Localized edema: Secondary | ICD-10-CM

## 2017-03-14 DIAGNOSIS — M7061 Trochanteric bursitis, right hip: Secondary | ICD-10-CM

## 2017-03-14 DIAGNOSIS — Z1231 Encounter for screening mammogram for malignant neoplasm of breast: Secondary | ICD-10-CM

## 2017-03-14 DIAGNOSIS — L03119 Cellulitis of unspecified part of limb: Secondary | ICD-10-CM

## 2017-03-14 MED ORDER — METHYLPREDNISOLONE ACETATE 40 MG/ML IJ SUSP
40.0000 mg | Freq: Once | INTRAMUSCULAR | Status: AC
  Administered 2017-03-14: 40 mg via INTRA_ARTICULAR

## 2017-03-14 NOTE — Progress Notes (Signed)
Chief complaint: Acute on chronic right hip pain 3 months  History of present illness: Katie Sherman is a 51 year old female who presents to the sports medicine office today for acute on chronic right hip pain. She was seen here in the office one week ago for this issue. At last office visit she was starting to develop early cellulitis of her lower extremities, was placed on Keflex. Cortisone injection to the right greater trochanter bursa was deferred due to concerns for early cellulitis. Unfortunately, even with Keflex symptoms worsen the next day where she had to be evaluated in the emergency department due to worsening erythema, swelling, and pain in the lower extremities. Her antibiotic coverage was broadened, the ED physician started her on clindamycin. She reports of interval improvement in her symptoms, but still has swelling in her bilateral lower extremities. She reports that today is her last day of clindamycin. She does not report of any fevers, chills, or night sweats. She does report a continued right hip pain today, does request steroid injection into the hip today. Reports any type of walking or prolonged standing aggravates her symptoms. She reports that she did get Percocet 10 mg in the emergency department, which slightly helped, but has been taking ibuprofen 800 mg daily over the last week, which has not helped her symptoms.  Interval past medical history, surgical history, family history, and social history reviewed, unchanged from last office visit unless stated above.  Review of systems:  As stated above  Physical exam: Vital signs are reviewed and are documented in the chart Gen.: Alert, oriented, appears stated age, in no apparent distress HEENT: Moist oral mucosa Respiratory: Normal respirations, able to speak in full sentences Cardiac: Regular rate, distal pulses 2+, bilateral 2+ pitting edema in feet and distal fourth of lower legs Integumentary: She does have resolving  cellulitis bilateral lower extremities, still has slight warmth, minimal erythema in distal fourth of bilateral lower extremities Neurologic: Strength 4+/5 with hip abduction on right side, pain is limiting factor, otherwise strength 5/5 in bilateral lower extremities, sensation 2+ in bilateral lower extremities Psych: Normal affect, mood is described as good Musculoskeletal: Inspection of right hip reveals no obvious deformity or muscular atrophy, no warmth, erythema, ecchymosis, or effusion noted, tender to palpation over the right greater trochanter bursa, specifically near anterior facet, pain with any type of hip movement, range of motion is the same with 45 of hip external and internal range of motion  Procedure: After obtaining written informed consent, discussing risk of bleeding, infection, and steroid flare, with benefit of pain relief, patient agrees to proceed with cortisone injection to the right greater trochanter bursa. Overlying skin was cleaned with Betadine and alcohol wipes, ethyl chloride was used as anesthetic spray. Using a 3-1 mixture of lidocaine and Depo-Medrol using spinal gauge needle, the right greater trochanteric bursa was injected with a needle pointing more anteromedially. Afterwards, Band-Aid was applied. No post procedure complications noted. She was observed in the office for 5-10 minutes to observe for any neurologic symptoms, which she did not have.  Assessment and plan: 1. Right greater trochanter bursitis 2. Resolving cellulitis in bilateral lower extremities 3. Bilateral lower extremity edema  Right greater trochanter bursitis -Given that she is having resolving cellulitis, I do feel comfortable in proceeding with cortisone injection to the right greater trochanter bursa -In light of recent cellulitis, will give half the dose of the steroid and lidocaine which was the reason why I used a 3-1 mixture of lidocaine and Depo-Medrol  Resolving cellulitis in  bilateral lower extremities -She is to be improving, today is her last day of clindamycin  Bilateral lower extremity edema -I discussed the touch base with her primary physician regarding this, does raise suspension of lymphedema, seems to have had negative workup, normal echocardiogram, discussed to have discussion primary physician about medication options  She will return to office in 2 weeks for follow-up of shoulder pain, diagnosed with adhesive capsulitis. Otherwise, she will return sooner as needed.    Mort Sawyers, M.D. [place]

## 2017-03-14 NOTE — Patient Instructions (Signed)
Please call office or go to emergency department if you develop any fevers, chills, night sweats, warmth, redness, or swelling around injection site. Also notify me of any new numbness, tingling, or weakness in your right lower extremity.

## 2017-03-21 ENCOUNTER — Ambulatory Visit: Payer: Self-pay | Attending: Family Medicine | Admitting: Family Medicine

## 2017-03-21 ENCOUNTER — Encounter: Payer: Self-pay | Admitting: Family Medicine

## 2017-03-21 ENCOUNTER — Ambulatory Visit: Payer: Self-pay | Admitting: Family Medicine

## 2017-03-21 VITALS — BP 100/69 | HR 83 | Temp 98.4°F | Resp 18 | Ht 66.0 in | Wt 184.0 lb

## 2017-03-21 DIAGNOSIS — R399 Unspecified symptoms and signs involving the genitourinary system: Secondary | ICD-10-CM

## 2017-03-21 DIAGNOSIS — M545 Low back pain, unspecified: Secondary | ICD-10-CM

## 2017-03-21 DIAGNOSIS — F319 Bipolar disorder, unspecified: Secondary | ICD-10-CM | POA: Insufficient documentation

## 2017-03-21 DIAGNOSIS — E860 Dehydration: Secondary | ICD-10-CM | POA: Insufficient documentation

## 2017-03-21 MED ORDER — CYCLOBENZAPRINE HCL 10 MG PO TABS: 10.0000 mg | ORAL_TABLET | Freq: Three times a day (TID) | ORAL | 0 refills | Status: DC | PRN

## 2017-03-21 MED ORDER — TRAMADOL HCL 50 MG PO TABS: 50.0000 mg | ORAL_TABLET | Freq: Three times a day (TID) | ORAL | 0 refills | Status: DC | PRN

## 2017-03-21 NOTE — Patient Instructions (Signed)
Dehydration, Adult Dehydration is when there is not enough fluid or water in your body. This happens when you lose more fluids than you take in. Dehydration can range from mild to very bad. It should be treated right away to keep it from getting very bad. Symptoms of mild dehydration may include:  Thirst.  Dry lips.  Slightly dry mouth.  Dry, warm skin.  Dizziness. Symptoms of moderate dehydration may include:  Very dry mouth.  Muscle cramps.  Dark pee (urine). Pee may be the color of tea.  Your body making less pee.  Your eyes making fewer tears.  Heartbeat that is uneven or faster than normal (palpitations).  Headache.  Light-headedness, especially when you stand up from sitting.  Fainting (syncope). Symptoms of very bad dehydration may include:  Changes in skin, such as: ? Cold and clammy skin. ? Blotchy (mottled) or pale skin. ? Skin that does not quickly return to normal after being lightly pinched and let go (poor skin turgor).  Changes in body fluids, such as: ? Feeling very thirsty. ? Your eyes making fewer tears. ? Not sweating when body temperature is high, such as in hot weather. ? Your body making very little pee.  Changes in vital signs, such as: ? Weak pulse. ? Pulse that is more than 100 beats a minute when you are sitting still. ? Fast breathing. ? Low blood pressure.  Other changes, such as: ? Sunken eyes. ? Cold hands and feet. ? Confusion. ? Lack of energy (lethargy). ? Trouble waking up from sleep. ? Short-term weight loss. ? Unconsciousness. Follow these instructions at home:  If told by your doctor, drink an ORS: ? Make an ORS by using instructions on the package. ? Start by drinking small amounts, about  cup (120 mL) every 5-10 minutes. ? Slowly drink more until you have had the amount that your doctor said to have.  Drink enough clear fluid to keep your pee clear or pale yellow. If you were told to drink an ORS, finish the ORS  first, then start slowly drinking clear fluids. Drink fluids such as: ? Water. Do not drink only water by itself. Doing that can make the salt (sodium) level in your body get too low (hyponatremia). ? Ice chips. ? Fruit juice that you have added water to (diluted). ? Low-calorie sports drinks.  Avoid: ? Alcohol. ? Drinks that have a lot of sugar. These include high-calorie sports drinks, fruit juice that does not have water added, and soda. ? Caffeine. ? Foods that are greasy or have a lot of fat or sugar.  Take over-the-counter and prescription medicines only as told by your doctor.  Do not take salt tablets. Doing that can make the salt level in your body get too high (hypernatremia).  Eat foods that have minerals (electrolytes). Examples include bananas, oranges, potatoes, tomatoes, and spinach.  Keep all follow-up visits as told by your doctor. This is important. Contact a doctor if:  You have belly (abdominal) pain that: ? Gets worse. ? Stays in one area (localizes).  You have a rash.  You have a stiff neck.  You get angry or annoyed more easily than normal (irritability).  You are more sleepy than normal.  You have a harder time waking up than normal.  You feel: ? Weak. ? Dizzy. ? Very thirsty.  You have peed (urinated) only a small amount of very dark pee during 6-8 hours. Get help right away if:  You have symptoms of   very bad dehydration.  You cannot drink fluids without throwing up (vomiting).  Your symptoms get worse with treatment.  You have a fever.  You have a very bad headache.  You are throwing up or having watery poop (diarrhea) and it: ? Gets worse. ? Does not go away.  You have blood or something green (bile) in your throw-up.  You have blood in your poop (stool). This may cause poop to look black and tarry.  You have not peed in 6-8 hours.  You pass out (faint).  Your heart rate when you are sitting still is more than 100 beats a  minute.  You have trouble breathing. This information is not intended to replace advice given to you by your health care provider. Make sure you discuss any questions you have with your health care provider. Document Released: 04/22/2009 Document Revised: 01/14/2016 Document Reviewed: 08/20/2015 Elsevier Interactive Patient Education  2018 [employer] Inc[employer]  

## 2017-03-21 NOTE — Progress Notes (Signed)
Subjective:  Patient ID: Katie Sherman, female    DOB: 03-15-1966  Age: 51 y.o. MRN: [id number]  CC: Follow-up   HPI Katie Sherman presents for complaints of lower back pain. Symptoms several days ago. The pain is rated 7-8/10 /10, and is located at the left lumbar area. The pain is described as aching, sharp, and occurs constant. Symptoms worse with walking. She does reports tea colored urine. Factors which relieve the pain include nothing. She reports sleeping and decreased fluid intake over the last several days. History of bipolar disorder.  She denies current suicidal and homicidal plan or intent.  Current  treatment includes abilify and trazodoneShe complains of the following side effects from the treatment: none. She declines speaking with LCSW at this time. History of cellulitis.  Cellulitis located to bilateral lower extremities. Was seen in the ED for symptoms and was prescribed a course of antibiotics to treat. She reports symptoms have resolved. Denies any redness, pain, or swelling.      Outpatient Medications Prior to Visit  Medication Sig Dispense Refill  . ARIPiprazole (ABILIFY) 15 MG tablet Take 1 tablet (15 mg total) by mouth daily. For mood control 30 tablet 0  . ARIPiprazole (ABILIFY) 20 MG tablet Take 20 mg by mouth daily.  0  . cephALEXin (KEFLEX) 500 MG capsule Take 3 times daily for 7 days. 21 capsule 0  . gabapentin (NEURONTIN) 100 MG capsule Take 1 capsule (100 mg total) by mouth 3 (three) times daily. For agitation 90 capsule 0  . HYDROcodone-acetaminophen (NORCO/VICODIN) 5-325 MG tablet Take 1-2 tablets by mouth every 6 (six) hours as needed for severe pain. 10 tablet 0  . ibuprofen (ADVIL,MOTRIN) 800 MG tablet TAKE 1 TABLET (800 MG TOTAL) BY MOUTH EVERY 8 (EIGHT) HOURS AS NEEDED FOR MODERATE PAIN (TAKE WITH FOOD). 30 tablet 0  . LORazepam (ATIVAN) 0.5 MG tablet Take 1 tablet (0.5 mg total) by mouth every 6 (six) hours as needed for anxiety. 10 tablet 0  .  nicotine (NICODERM CQ - DOSED IN MG/24 HOURS) 14 mg/24hr patch Place 1 patch (14 mg total) onto the skin daily. For smoking cessation 21 patch 1  . omega-3 acid ethyl esters (LOVAZA) 1 g capsule Take 1 capsule (1 g total) by mouth 2 (two) times daily. For high cholesterol 60 capsule 2  . pravastatin (PRAVACHOL) 20 MG tablet Take 1 tablet (20 mg total) by mouth daily at 6 PM. For high cholesterol 30 tablet 2  . ranitidine (ZANTAC) 150 MG tablet Take 1 tablet (150 mg total) by mouth 2 (two) times daily as needed for heartburn. 30 tablet 6  . traZODone (DESYREL) 100 MG tablet TAKE 1 1 2  TABLET BY MOUTH AT BEDTIME  0  . traZODone (DESYREL) 150 MG tablet Take 1 tablet (150 mg total) by mouth at bedtime as needed for sleep. 30 tablet 0  . Vitamin D, Ergocalciferol, (DRISDOL) 50000 units CAPS capsule Take 1 capsule (50,000 Units total) by mouth every 7 (seven) days. 8 capsule 0   No facility-administered medications prior to visit.     ROS Review of Systems  Constitutional: Negative.   Eyes: Negative.   Respiratory: Negative.   Cardiovascular: Negative.   Gastrointestinal: Negative.   Genitourinary: Negative for difficulty urinating.       Dark urine  Musculoskeletal: Positive for back pain.  Skin: Negative.   Psychiatric/Behavioral: Negative for suicidal ideas.    Objective:  BP 100/69 (BP Location: Left Arm, Patient Position: Sitting, Cuff Size: Normal)  Pulse 83   Temp 98.4 F (36.9 C) (Oral)   Resp 18   Ht 5\' 6"  (1.676 m)   Wt 184 lb (83.5 kg)   LMP 04/29/2012   SpO2 97%   BMI 29.70 kg/m   BP/Weight 03/21/2017 03/14/2017 08/21/2480  Systolic BP 500 370 488  Diastolic BP 69 90 84  Wt. (Lbs) 184 170 -  BMI 29.7 27.44 -  Some encounter information is confidential and restricted. Go to Review Flowsheets activity to see all data.     Physical Exam  Constitutional: She appears well-developed and well-nourished.  HENT:  Head: Normocephalic and atraumatic.  Right Ear: External  ear normal.  Left Ear: External ear normal.  Nose: Nose normal.  Mouth/Throat: Oropharynx is clear and moist.  Eyes: Pupils are equal, round, and reactive to light. Conjunctivae are normal.  Cardiovascular: Normal rate, regular rhythm, normal heart sounds and intact distal pulses.   Pulmonary/Chest: Effort normal and breath sounds normal.  Abdominal: Soft. Bowel sounds are normal. There is no tenderness.  Musculoskeletal:       Lumbar back: She exhibits pain (left sided ).  Skin: Skin is warm and dry. No erythema.  Psychiatric: She expresses no homicidal and no suicidal ideation. She expresses no suicidal plans and no homicidal plans.  Nursing note and vitals reviewed.   Assessment & Plan:   1. Acute left-sided low back pain without sciatica  - cyclobenzaprine (FLEXERIL) 10 MG tablet; Take 1 tablet (10 mg total) by mouth 3 (three) times daily as needed for muscle spasms.  Dispense: 30 tablet; Refill: 0 - traMADol (ULTRAM) 50 MG tablet; Take 1 tablet (50 mg total) by mouth every 8 (eight) hours as needed.  Dispense: 30 tablet; Refill: 0  2. Dehydration -U/A performed. -Increase fluid intake.    Follow-up: Return if symptoms worsen or fail to improve.   Alfonse Spruce FNP

## 2017-03-21 NOTE — Progress Notes (Signed)
Patient is here for ED f/up   Patient complains left lower back pain & cold sweats

## 2017-03-27 ENCOUNTER — Encounter (HOSPITAL_COMMUNITY): Payer: Self-pay

## 2017-03-27 ENCOUNTER — Emergency Department (HOSPITAL_COMMUNITY)
Admission: EM | Admit: 2017-03-27 | Discharge: 2017-03-28 | Disposition: A | Discharge status: Home / Self Care | Payer: No Typology Code available for payment source | Attending: Emergency Medicine | Admitting: Emergency Medicine

## 2017-03-27 DIAGNOSIS — F319 Bipolar disorder, unspecified: Secondary | ICD-10-CM

## 2017-03-27 DIAGNOSIS — Z8541 Personal history of malignant neoplasm of cervix uteri: Secondary | ICD-10-CM | POA: Insufficient documentation

## 2017-03-27 DIAGNOSIS — Z79899 Other long term (current) drug therapy: Secondary | ICD-10-CM | POA: Insufficient documentation

## 2017-03-27 DIAGNOSIS — L539 Erythematous condition, unspecified: Secondary | ICD-10-CM | POA: Insufficient documentation

## 2017-03-27 DIAGNOSIS — F329 Major depressive disorder, single episode, unspecified: Secondary | ICD-10-CM | POA: Diagnosis present

## 2017-03-27 DIAGNOSIS — R45851 Suicidal ideations: Secondary | ICD-10-CM

## 2017-03-27 DIAGNOSIS — F17210 Nicotine dependence, cigarettes, uncomplicated: Secondary | ICD-10-CM | POA: Insufficient documentation

## 2017-03-27 DIAGNOSIS — F32A Depression, unspecified: Secondary | ICD-10-CM | POA: Diagnosis present

## 2017-03-27 LAB — COMPREHENSIVE METABOLIC PANEL
ALBUMIN: 4.2 g/dL (ref 3.5–5.0)
ALT: 14 U/L (ref 14–54)
AST: 17 U/L (ref 15–41)
Alkaline Phosphatase: 72 U/L (ref 38–126)
Anion gap: 9 (ref 5–15)
BUN: 10 mg/dL (ref 6–20)
CHLORIDE: 104 mmol/L (ref 101–111)
CO2: 27 mmol/L (ref 22–32)
Calcium: 9 mg/dL (ref 8.9–10.3)
Creatinine, Ser: 0.97 mg/dL (ref 0.44–1.00)
GFR calc Af Amer: >60 mL/min (ref >60)
GFR calc non Af Amer: >60 mL/min (ref >60)
GLUCOSE: 110 mg/dL — AB (ref 65–99)
POTASSIUM: 3.4 mmol/L — AB (ref 3.5–5.1)
SODIUM: 140 mmol/L (ref 135–145)
Total Bilirubin: 0.3 mg/dL (ref 0.3–1.2)
Total Protein: 7.2 g/dL (ref 6.5–8.1)

## 2017-03-27 LAB — CBC
HEMATOCRIT: 38.8 % (ref 36.0–46.0)
Hemoglobin: 13 g/dL (ref 12.0–15.0)
MCH: 31.3 pg (ref 26.0–34.0)
MCHC: 33.5 g/dL (ref 30.0–36.0)
MCV: 93.3 fL (ref 78.0–100.0)
Platelets: 280 10*3/uL (ref 150–400)
RBC: 4.16 MIL/uL (ref 3.87–5.11)
RDW: 12.9 % (ref 11.5–15.5)
WBC: 5.6 10*3/uL (ref 4.0–10.5)

## 2017-03-27 LAB — RAPID URINE DRUG SCREEN, HOSP PERFORMED
AMPHETAMINES: NOT DETECTED
BARBITURATES: NOT DETECTED
BENZODIAZEPINES: NOT DETECTED
COCAINE: NOT DETECTED
Opiates: NOT DETECTED
Tetrahydrocannabinol: POSITIVE — AB

## 2017-03-27 LAB — SALICYLATE LEVEL: Salicylate Lvl: <7 mg/dL (ref 2.8–30.0)

## 2017-03-27 LAB — ETHANOL: Alcohol, Ethyl (B): <5 mg/dL (ref <5)

## 2017-03-27 LAB — ACETAMINOPHEN LEVEL: Acetaminophen (Tylenol), Serum: <10 ug/mL — ABNORMAL LOW (ref 10–30)

## 2017-03-27 MED ORDER — CLINDAMYCIN HCL 300 MG PO CAPS
300.0000 mg | ORAL_CAPSULE | Freq: Three times a day (TID) | ORAL | Status: DC
  Administered 2017-03-27 – 2017-03-28 (×4): 300 mg via ORAL
  Filled 2017-03-27 (×5): qty 1

## 2017-03-27 MED ORDER — FAMOTIDINE 20 MG PO TABS: 20.0000 mg | ORAL_TABLET | Freq: Two times a day (BID) | ORAL | Status: DC | PRN

## 2017-03-27 MED ORDER — OXYCODONE-ACETAMINOPHEN 5-325 MG PO TABS
1.0000 | ORAL_TABLET | Freq: Once | ORAL | Status: AC
  Administered 2017-03-27: 1 via ORAL
  Filled 2017-03-27: qty 1

## 2017-03-27 MED ORDER — IBUPROFEN 200 MG PO TABS
600.0000 mg | ORAL_TABLET | Freq: Once | ORAL | Status: DC
  Filled 2017-03-27: qty 3

## 2017-03-27 MED ORDER — CEPHALEXIN 500 MG PO CAPS: 500.0000 mg | ORAL_CAPSULE | Freq: Once | ORAL | Status: DC

## 2017-03-27 MED ORDER — PRAVASTATIN SODIUM 20 MG PO TABS
20.0000 mg | ORAL_TABLET | Freq: Every day | ORAL | Status: DC
  Administered 2017-03-27: 20 mg via ORAL
  Filled 2017-03-27 (×2): qty 1

## 2017-03-27 MED ORDER — NICOTINE 21 MG/24HR TD PT24
21.0000 mg | MEDICATED_PATCH | Freq: Every day | TRANSDERMAL | Status: DC
  Administered 2017-03-27 – 2017-03-28 (×2): 21 mg via TRANSDERMAL
  Filled 2017-03-27 (×2): qty 1

## 2017-03-27 MED ORDER — GABAPENTIN 100 MG PO CAPS
100.0000 mg | ORAL_CAPSULE | Freq: Three times a day (TID) | ORAL | Status: DC
  Administered 2017-03-27 – 2017-03-28 (×3): 100 mg via ORAL
  Filled 2017-03-27 (×3): qty 1

## 2017-03-27 MED ORDER — TRAZODONE HCL 50 MG PO TABS
150.0000 mg | ORAL_TABLET | Freq: Every evening | ORAL | Status: DC | PRN
  Administered 2017-03-27: 150 mg via ORAL
  Filled 2017-03-27: qty 1

## 2017-03-27 MED ORDER — ARIPIPRAZOLE 10 MG PO TABS
20.0000 mg | ORAL_TABLET | Freq: Every day | ORAL | Status: DC
  Filled 2017-03-27 (×2): qty 2

## 2017-03-27 NOTE — ED Provider Notes (Signed)
[facility] DEPT Provider Note   CSN: [id number] Arrival date & time: 03/27/17  0422     History   Chief Complaint Chief Complaint  Patient presents with  . Suicidal    HPI Katie Sherman is a 51 y.o. female.  HPI   Katie Sherman is a 51 y.o. female, with a history of Suicide attempt, presenting to the ED with complaint of suicidal thoughts. States, "I just don't want to be here anymore." Denies a plan.  States she started smoking "Cloyde Reams" about two weeks ago. Last used 9/16. Denies alcohol use.  Denies HI. Denies A/V hallucinations. Endorses previous suicide attempt "years ago" via drug overdose. States that her husband committed suicide 8 years ago. Stopped taking her abilify on her own about a month ago because she "didn't like the side effects" including muscle stiffness. She has not seen her provider since she stopped the medication. States she has not taken any of her other medications in about a week.   Also complains of left foot pain and erythema for the last two days. States she was previously diagnosed with bilateral lower extremity cellulitis on 8/29, started on Keflex, then in the ED on 8/30 and switched to Clindamycin 300mg  TID for 7 days. States this cleared up the problem, but it is recurring on her left foot.   Denies fever/chills, CP, SOB, falls/trauma, numbness, weakness, headaches, N/V/D, abdominal pain, or any other complaints.     Past Medical History:  Diagnosis Date  . Cervical cancer ([place])   . Cervical cancer ([place])   . History of exercise stress test    ETT-Echo 5/18: normal EF, no ischemia  . History of loop recorder    currently on person  . Kidney stone     Patient Active Problem List   Diagnosis Date Noted  . Postmenopausal 01/22/2017  . Bipolar 1 disorder, depressed ([place]) 12/29/2016  . Family history of suicide 11/20/2016  . Depression 11/20/2016  . Family history of breast cancer 11/20/2016  . Adhesive capsulitis of left shoulder  11/19/2016  . Greater trochanteric bursitis of right hip 11/19/2016    Past Surgical History:  Procedure Laterality Date  . CERVICAL CONE BIOPSY    . TUBAL LIGATION      OB History    Gravida Para Term Preterm AB Living   6 5 5   1 5    SAB TAB Ectopic Multiple Live Births     1     5       Home Medications    Prior to Admission medications   Medication Sig Start Date End Date Taking? Authorizing Provider  ARIPiprazole (ABILIFY) 20 MG tablet Take 20 mg by mouth daily. 02/08/17  Yes [provider]  gabapentin (NEURONTIN) 100 MG capsule Take 1 capsule (100 mg total) by mouth 3 (three) times daily. For agitation 01/04/17  Yes Nwoko, Herbert Pun I, NP  ibuprofen (ADVIL,MOTRIN) 800 MG tablet TAKE 1 TABLET (800 MG TOTAL) BY MOUTH EVERY 8 (EIGHT) HOURS AS NEEDED FOR MODERATE PAIN (TAKE WITH FOOD). 02/22/17  Yes Hairston, Maylon Peppers, FNP  omega-3 acid ethyl esters (LOVAZA) 1 g capsule Take 1 capsule (1 g total) by mouth 2 (two) times daily. For high cholesterol 01/24/17  Yes Hairston, Mandesia R, FNP  pravastatin (PRAVACHOL) 20 MG tablet Take 1 tablet (20 mg total) by mouth daily at 6 PM. For high cholesterol 01/24/17  Yes Hairston, Mandesia R, FNP  ranitidine (ZANTAC) 150 MG tablet Take 1 tablet (150 mg total) by  mouth 2 (two) times daily as needed for heartburn. 01/24/17  Yes Hairston, Maylon Peppers, FNP  traMADol (ULTRAM) 50 MG tablet Take 1 tablet (50 mg total) by mouth every 8 (eight) hours as needed. Patient taking differently: Take 50 mg by mouth every 8 (eight) hours as needed for moderate pain.  03/21/17  Yes Hairston, Maylon Peppers, FNP  traZODone (DESYREL) 150 MG tablet Take 1 tablet (150 mg total) by mouth at bedtime as needed for sleep. 01/24/17  Yes Hairston, Toy Baker R, FNP  ARIPiprazole (ABILIFY) 15 MG tablet Take 1 tablet (15 mg total) by mouth daily. For mood control 01/24/17   Alfonse Spruce, FNP  cephALEXin (KEFLEX) 500 MG capsule Take 3 times daily for 7 days. Patient not  taking: Reported on 03/27/2017 03/07/17   Lake, Christoper P, MD  cyclobenzaprine (FLEXERIL) 10 MG tablet Take 1 tablet (10 mg total) by mouth 3 (three) times daily as needed for muscle spasms. 03/21/17   Alfonse Spruce, FNP  HYDROcodone-acetaminophen (NORCO/VICODIN) 5-325 MG tablet Take 1-2 tablets by mouth every 6 (six) hours as needed for severe pain. Patient not taking: Reported on 03/27/2017 03/08/17   Little, Wenda Overland, MD  LORazepam (ATIVAN) 0.5 MG tablet Take 1 tablet (0.5 mg total) by mouth every 6 (six) hours as needed for anxiety. Patient not taking: Reported on 03/27/2017 01/24/17   Alfonse Spruce, FNP  nicotine (NICODERM CQ - DOSED IN MG/24 HOURS) 14 mg/24hr patch Place 1 patch (14 mg total) onto the skin daily. For smoking cessation Patient not taking: Reported on 03/27/2017 03/01/17   Alfonse Spruce, FNP    Family History Family History  Problem Relation Age of Onset  . Heart attack Mother 30  . Heart disease Father   . Heart attack Father 43    Social History Social History  Substance Use Topics  . Smoking status: Current Every Day Smoker    Packs/day: 1.00    Years: 36.00    Types: Cigarettes    Start date: 7  . Smokeless tobacco: Never Used  . Alcohol use No     Allergies   Patient has no known allergies.   Review of Systems Review of Systems  Constitutional: Negative for chills, diaphoresis and fever.  Respiratory: Negative for shortness of breath.   Cardiovascular: Negative for chest pain.  Gastrointestinal: Negative for abdominal pain, nausea and vomiting.  Musculoskeletal: Positive for arthralgias.  Skin: Positive for color change.  Neurological: Negative for dizziness, weakness, light-headedness, numbness and headaches.  Psychiatric/Behavioral: Positive for suicidal ideas. Negative for hallucinations.  All other systems reviewed and are negative.    Physical Exam Updated Vital Signs BP 110/74 (BP Location: Left Arm)   Pulse  (!) 53   Temp 97.8 F (36.6 C) (Oral)   Resp 13   LMP 04/29/2012   SpO2 100%   Physical Exam  Constitutional: She appears well-developed and well-nourished. No distress.  HENT:  Head: Normocephalic and atraumatic.  Eyes: Conjunctivae are normal.  Neck: Neck supple.  Cardiovascular: Normal rate, regular rhythm, normal heart sounds and intact distal pulses.   Pulmonary/Chest: Effort normal and breath sounds normal. No respiratory distress.  Abdominal: Soft. There is no tenderness. There is no guarding.  Musculoskeletal: She exhibits edema and tenderness.  Lymphadenopathy:    She has no cervical adenopathy.  Neurological: She is alert.  No noted sensory deficits in bilateral lower extremities. Strength 5/5 with flexion/extension in the bilateral ankles and knees.   Skin: Skin is warm and  dry. Capillary refill takes less than 2 seconds. She is not diaphoretic. There is erythema.  Tenderness and erythema to the left medial foot and ankle. Swelling and warm to touch.   Psychiatric: She is withdrawn. She exhibits a depressed mood.  Nursing note and vitals reviewed.        ED Treatments / Results  Labs (all labs ordered are listed, but only abnormal results are displayed) Labs Reviewed  COMPREHENSIVE METABOLIC PANEL - Abnormal; Notable for the following:       Result Value   Potassium 3.4 (*)    Glucose, Bld 110 (*)    All other components within normal limits  ACETAMINOPHEN LEVEL - Abnormal; Notable for the following:    Acetaminophen (Tylenol), Serum <10 (*)    All other components within normal limits  ETHANOL  SALICYLATE LEVEL  CBC  RAPID URINE DRUG SCREEN, HOSP PERFORMED    EKG  EKG Interpretation None       Radiology No results found.  Procedures Procedures (including critical care time)  Medications Ordered in ED Medications  nicotine (NICODERM CQ - dosed in mg/24 hours) patch 21 mg (not administered)  ibuprofen (ADVIL,MOTRIN) tablet 600 mg (not  administered)  clindamycin (CLEOCIN) capsule 300 mg (not administered)  oxyCODONE-acetaminophen (PERCOCET/ROXICET) 5-325 MG per tablet 1 tablet (not administered)  traZODone (DESYREL) tablet 150 mg (not administered)  famotidine (PEPCID) tablet 20 mg (not administered)  pravastatin (PRAVACHOL) tablet 20 mg (not administered)  gabapentin (NEURONTIN) capsule 100 mg (not administered)  ARIPiprazole (ABILIFY) tablet 20 mg (not administered)     Initial Impression / Assessment and Plan / ED Course  I have reviewed the triage vital signs and the nursing notes.  Pertinent labs & imaging results that were available during my care of the patient were reviewed by me and considered in my medical decision making (see chart for details).     Patient presents with complaint of suicidal ideations. SAD PERSONS score of 5.  Likely cellulitis on left foot. Reinitiate 300 mg PO clindamycin 3 times a day, take for 10 days. Lab results encouraging. Patient otherwise medically cleared. Home medications ordered.   SAD PERSONS scale S - Sex:  1 if female; 0 if female; (more females attempt, more males succeed) A - Age: 27 if < 20 or > 61 D - Depression: 1 if depression is present P - Previous attempt: 1 if present E - Ethanol abuse: 1 if present R - Rational thinking loss (psychosis): 1 if present S - Social Supports Lacking: 1 if present O - Organized Plan: 1 if plan is made and lethal N - No Spouse: 1 if divorced, widowed, separated, or single S - Sickness: 1 if chronic, debilitating, and severe  0-2 Send home with followup 3-4 Close follow up; consider hospitalization 5-6 Strongly consider hospitalization, depending on confidence in follow up arrangement 7-10 Hospitalize or commit  Score of 5  Vitals:   03/27/17 0532  BP: 110/74  Pulse: (!) 53  Resp: 13  Temp: 97.8 F (36.6 C)  TempSrc: Oral  SpO2: 100%     Final Clinical Impressions(s) / ED Diagnoses   Final diagnoses:  Suicidal  ideations    New Prescriptions New Prescriptions   No medications on file     Layla Maw 03/27/17 [place], [place], MD 03/27/17 1422

## 2017-03-27 NOTE — ED Notes (Signed)
Pt states she is suicidal and she's coming off of drugs Pt states she last used meth and molly two days ago Pt also complains of a red swollen left foot, she's had antibiotics for it and it's coming back

## 2017-03-27 NOTE — ED Notes (Signed)
Pt no noted distress. Denies SI/HI/AVH, contract for safety. Pt stated she was tired wants to rest. Staff will continue to monitor, meet needs, and maintain safety.

## 2017-03-27 NOTE — ED Notes (Signed)
Pt A&O x 3, sleeping at present, no distress noted, calm & cooperative.  Monitoring for safety, Q 15 min checks in effect.

## 2017-03-27 NOTE — ED Notes (Signed)
Bed: [facility] Expected date:  Expected time:  Means of arrival:  Comments: TRI 3
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
## 2017-03-28 ENCOUNTER — Ambulatory Visit: Payer: Self-pay | Admitting: Family Medicine

## 2017-03-28 DIAGNOSIS — R451 Restlessness and agitation: Secondary | ICD-10-CM

## 2017-03-28 DIAGNOSIS — F1190 Opioid use, unspecified, uncomplicated: Secondary | ICD-10-CM

## 2017-03-28 DIAGNOSIS — F1290 Cannabis use, unspecified, uncomplicated: Secondary | ICD-10-CM

## 2017-03-28 DIAGNOSIS — F329 Major depressive disorder, single episode, unspecified: Secondary | ICD-10-CM

## 2017-03-28 DIAGNOSIS — F17210 Nicotine dependence, cigarettes, uncomplicated: Secondary | ICD-10-CM

## 2017-03-28 DIAGNOSIS — F1910 Other psychoactive substance abuse, uncomplicated: Secondary | ICD-10-CM

## 2017-03-28 DIAGNOSIS — R4587 Impulsiveness: Secondary | ICD-10-CM

## 2017-03-28 NOTE — Consult Note (Signed)
[place] Psychiatry Consult   Reason for Consult:  Suicidal Ideation Referring Physician:  EDP Patient Identification: Katie Sherman MRN:  [id number] Principal Diagnosis: Depression Diagnosis:   Patient Active Problem List   Diagnosis Date Noted  . Postmenopausal [Z78.0] 01/22/2017  . Bipolar 1 disorder, depressed ([place]) [F31.9] 12/29/2016  . Family history of suicide [Z81.8] 11/20/2016  . Depression [F32.9] 11/20/2016  . Family history of breast cancer [Z80.3] 11/20/2016  . Adhesive capsulitis of left shoulder [M75.02] 11/19/2016  . Greater trochanteric bursitis of right hip [M70.61] 11/19/2016    Total Time spent with patient: 45 minutes  Subjective:   Katie Sherman is a 51 y.o. female patient admitted with suicidal ideation while "high" on drugs.  HPI:  Pt was seen and chart reviewed with treatment team and Dr Darleene Cleaver. Pt presented to the [facility] with suicidal ideations while "hih" on [place] and THC. Pt's UDS positive for THC , BAL negative. Pt stated she has been using "Molly", for the first time in her life, for the past three weeks. Pt stated she stopped taking her Abilify three weeks ago and stopped going to her therapy appointments.  Pt denies suicidal/homicidal ideation, denies auditory/visual hallucinations and does not appear to be responding to internal stimuli. Pt was calm and cooperative, alert & oriented x 4 and appropriate for the situation. Pt was instructed to return to [facility] and let the psychiatrist know the Abilify is giving her muscle stiffness. Pt is psychiatrically cleared for discharge.   Past Psychiatric History: As above  Risk to Self: None Risk to Others: None  Prior Inpatient Therapy: Prior Inpatient Therapy: Yes [facility]and when I was a teenager") Prior Therapy Facilty/Provider(s):  [facility]) Reason for Treatment:  (suicidal ) Prior Outpatient Therapy: Prior Outpatient Therapy: Yes Prior Therapy Dates:  (current) Prior  Therapy Facilty/Provider(s):  ([facility] ) Reason for Treatment:  (Medication managment ) Does patient have an ACCT team?: No Does patient have Intensive In-House Services?  : No Does patient have [facility] services? : No Does patient have P4CC services?: No  Past Medical History:  Past Medical History:  Diagnosis Date  . Cervical cancer ([place])   . Cervical cancer ([place])   . History of exercise stress test    ETT-Echo 5/18: normal EF, no ischemia  . History of loop recorder    currently on person  . Kidney stone     Past Surgical History:  Procedure Laterality Date  . CERVICAL CONE BIOPSY    . TUBAL LIGATION     Family History:  Family History  Problem Relation Age of Onset  . Heart attack Mother 8  . Heart disease Father   . Heart attack Father 19   Family Psychiatric  History: Unknown Social History:  History  Alcohol Use No     History  Drug Use  . Types: Marijuana    Social History   Social History  . Marital status: Widowed    Spouse name: N/A  . Number of children: N/A  . Years of education: N/A   Occupational History  . unemployed    Social History Main Topics  . Smoking status: Current Every Day Smoker    Packs/day: 1.00    Years: 36.00    Types: Cigarettes    Start date: 60  . Smokeless tobacco: Never Used  . Alcohol use No  . Drug use: Yes    Types: Marijuana  . Sexual activity: Not Currently  Birth control/ protection: None   Other Topics Concern  . None   Social History Narrative   Prior traffic Administrator, sports - unemployed now   Widow   5 children, 10 grandchildren   Moved here from [place] 2013   Additional Social History:    Allergies:  No Known Allergies  Labs:  Results for orders placed or performed during the hospital encounter of 03/27/17 (from the past 48 hour(s))  Comprehensive metabolic panel     Status: Abnormal   Collection Time: 03/27/17  6:18 AM  Result Value Ref Range   Sodium 140  135 - 145 mmol/L   Potassium 3.4 (L) 3.5 - 5.1 mmol/L   Chloride 104 101 - 111 mmol/L   CO2 27 22 - 32 mmol/L   Glucose, Bld 110 (H) 65 - 99 mg/dL   BUN 10 6 - 20 mg/dL   Creatinine, Ser 0.97 0.44 - 1.00 mg/dL   Calcium 9.0 8.9 - 10.3 mg/dL   Total Protein 7.2 6.5 - 8.1 g/dL   Albumin 4.2 3.5 - 5.0 g/dL   AST 17 15 - 41 U/L   ALT 14 14 - 54 U/L   Alkaline Phosphatase 72 38 - 126 U/L   Total Bilirubin 0.3 0.3 - 1.2 mg/dL   GFR calc non Af Amer >60 >60 mL/min   GFR calc Af Amer >60 >60 mL/min    Comment: (NOTE) The eGFR has been calculated using the CKD EPI equation. This calculation has not been validated in all clinical situations. eGFR's persistently <60 mL/min signify possible Chronic Kidney Disease.    Anion gap 9 5 - 15  Ethanol     Status: None   Collection Time: 03/27/17  6:18 AM  Result Value Ref Range   Alcohol, Ethyl (B) <5 <5 mg/dL    Comment:        LOWEST DETECTABLE LIMIT FOR SERUM ALCOHOL IS 5 mg/dL FOR MEDICAL PURPOSES ONLY   Salicylate level     Status: None   Collection Time: 03/27/17  6:18 AM  Result Value Ref Range   Salicylate Lvl <4.9 2.8 - 30.0 mg/dL  Acetaminophen level     Status: Abnormal   Collection Time: 03/27/17  6:18 AM  Result Value Ref Range   Acetaminophen (Tylenol), Serum <10 (L) 10 - 30 ug/mL    Comment:        THERAPEUTIC CONCENTRATIONS VARY SIGNIFICANTLY. A RANGE OF 10-30 ug/mL MAY BE AN EFFECTIVE CONCENTRATION FOR MANY PATIENTS. HOWEVER, SOME ARE BEST TREATED AT CONCENTRATIONS OUTSIDE THIS RANGE. ACETAMINOPHEN CONCENTRATIONS >150 ug/mL AT 4 HOURS AFTER INGESTION AND >50 ug/mL AT 12 HOURS AFTER INGESTION ARE OFTEN ASSOCIATED WITH TOXIC REACTIONS.   cbc     Status: None   Collection Time: 03/27/17  6:18 AM  Result Value Ref Range   WBC 5.6 4.0 - 10.5 K/uL   RBC 4.16 3.87 - 5.11 MIL/uL   Hemoglobin 13.0 12.0 - 15.0 g/dL   HCT 38.8 36.0 - 46.0 %   MCV 93.3 78.0 - 100.0 fL   MCH 31.3 26.0 - 34.0 pg   MCHC 33.5 30.0 - 36.0  g/dL   RDW 12.9 11.5 - 15.5 %   Platelets 280 150 - 400 K/uL  Rapid urine drug screen (hospital performed)     Status: Abnormal   Collection Time: 03/27/17 12:47 PM  Result Value Ref Range   Opiates NONE DETECTED NONE DETECTED   Cocaine NONE DETECTED NONE DETECTED   Benzodiazepines NONE DETECTED NONE  DETECTED   Amphetamines NONE DETECTED NONE DETECTED   Tetrahydrocannabinol POSITIVE (A) NONE DETECTED   Barbiturates NONE DETECTED NONE DETECTED    Comment:        DRUG SCREEN FOR MEDICAL PURPOSES ONLY.  IF CONFIRMATION IS NEEDED FOR ANY PURPOSE, NOTIFY LAB WITHIN 5 DAYS.        LOWEST DETECTABLE LIMITS FOR URINE DRUG SCREEN Drug Class       Cutoff (ng/mL) Amphetamine      1000 Barbiturate      200 Benzodiazepine   675 Tricyclics       916 Opiates          300 Cocaine          300 THC              50     Current Facility-Administered Medications  Medication Dose Route Frequency Provider Last Rate Last Dose  . ARIPiprazole (ABILIFY) tablet 20 mg  20 mg Oral Daily Joy, Shawn C, PA-C      . clindamycin (CLEOCIN) capsule 300 mg  300 mg Oral Q8H Joy, Shawn C, PA-C   300 mg at 03/28/17 0605  . famotidine (PEPCID) tablet 20 mg  20 mg Oral BID PRN Joy, Shawn C, PA-C      . gabapentin (NEURONTIN) capsule 100 mg  100 mg Oral TID Joy, Shawn C, PA-C   100 mg at 03/28/17 0932  . ibuprofen (ADVIL,MOTRIN) tablet 600 mg  600 mg Oral Once Joy, Shawn C, PA-C      . nicotine (NICODERM CQ - dosed in mg/24 hours) patch 21 mg  21 mg Transdermal Daily Joy, Shawn C, PA-C   21 mg at 03/28/17 0933  . pravastatin (PRAVACHOL) tablet 20 mg  20 mg Oral q1800 Joy, Shawn C, PA-C   20 mg at 03/27/17 1718  . traZODone (DESYREL) tablet 150 mg  150 mg Oral QHS PRN Joy, Shawn C, PA-C   150 mg at 03/27/17 2143   Current Outpatient Prescriptions  Medication Sig Dispense Refill  . ARIPiprazole (ABILIFY) 20 MG tablet Take 20 mg by mouth daily.  0  . gabapentin (NEURONTIN) 100 MG capsule Take 1 capsule (100 mg total)  by mouth 3 (three) times daily. For agitation 90 capsule 0  . ibuprofen (ADVIL,MOTRIN) 800 MG tablet TAKE 1 TABLET (800 MG TOTAL) BY MOUTH EVERY 8 (EIGHT) HOURS AS NEEDED FOR MODERATE PAIN (TAKE WITH FOOD). 30 tablet 0  . omega-3 acid ethyl esters (LOVAZA) 1 g capsule Take 1 capsule (1 g total) by mouth 2 (two) times daily. For high cholesterol 60 capsule 2  . pravastatin (PRAVACHOL) 20 MG tablet Take 1 tablet (20 mg total) by mouth daily at 6 PM. For high cholesterol 30 tablet 2  . ranitidine (ZANTAC) 150 MG tablet Take 1 tablet (150 mg total) by mouth 2 (two) times daily as needed for heartburn. 30 tablet 6  . traMADol (ULTRAM) 50 MG tablet Take 1 tablet (50 mg total) by mouth every 8 (eight) hours as needed. (Patient taking differently: Take 50 mg by mouth every 8 (eight) hours as needed for moderate pain. ) 30 tablet 0  . traZODone (DESYREL) 150 MG tablet Take 1 tablet (150 mg total) by mouth at bedtime as needed for sleep. 30 tablet 0  . ARIPiprazole (ABILIFY) 15 MG tablet Take 1 tablet (15 mg total) by mouth daily. For mood control 30 tablet 0  . cephALEXin (KEFLEX) 500 MG capsule Take 3 times daily for 7 days. (  Patient not taking: Reported on 03/27/2017) 21 capsule 0  . cyclobenzaprine (FLEXERIL) 10 MG tablet Take 1 tablet (10 mg total) by mouth 3 (three) times daily as needed for muscle spasms. 30 tablet 0  . HYDROcodone-acetaminophen (NORCO/VICODIN) 5-325 MG tablet Take 1-2 tablets by mouth every 6 (six) hours as needed for severe pain. (Patient not taking: Reported on 03/27/2017) 10 tablet 0  . LORazepam (ATIVAN) 0.5 MG tablet Take 1 tablet (0.5 mg total) by mouth every 6 (six) hours as needed for anxiety. (Patient not taking: Reported on 03/27/2017) 10 tablet 0  . nicotine (NICODERM CQ - DOSED IN MG/24 HOURS) 14 mg/24hr patch Place 1 patch (14 mg total) onto the skin daily. For smoking cessation (Patient not taking: Reported on 03/27/2017) 21 patch 1    Musculoskeletal: Strength & Muscle Tone:  within normal limits Gait & Station: normal Patient leans: N/A  Psychiatric Specialty Exam: Physical Exam  Constitutional: She is oriented to person, place, and time. She appears well-developed and well-nourished.  HENT:  Head: Normocephalic.  Respiratory: Effort normal.  Musculoskeletal: Normal range of motion.  Neurological: She is alert and oriented to person, place, and time.  Psychiatric: Her speech is normal. Thought content normal. She is agitated. Cognition and memory are normal. She expresses impulsivity. She exhibits a depressed mood.    Review of Systems  Psychiatric/Behavioral: Positive for depression and substance abuse. Negative for hallucinations, memory loss and suicidal ideas. The patient is not nervous/anxious and does not have insomnia.   All other systems reviewed and are negative.   Blood pressure (!) 99/55, pulse (!) 59, temperature (!) 97.3 F (36.3 C), temperature source Oral, resp. rate 20, last menstrual period 04/29/2012, SpO2 98 %.There is no height or weight on file to calculate BMI.  General Appearance: Casual  Eye Contact:  Good  Speech:  Clear and Coherent  Volume:  Normal  Mood:  Depressed and Irritable  Affect:  Congruent and Depressed  Thought Process:  Coherent and Linear  Orientation:  Full (Time, Place, and Person)  Thought Content:  Logical  Suicidal Thoughts:  No  Homicidal Thoughts:  No  Memory:  Immediate;   Good Recent;   Good Remote;   Fair  Judgement:  Good  Insight:  Good  Psychomotor Activity:  Normal  Concentration:  Concentration: Good and Attention Span: Good  Recall:  Good  Fund of Knowledge:  Good  Language:  Good  Akathisia:  No  Handed:  Right  AIMS (if indicated):     Assets:  Agricultural consultant Housing Social Support  ADL's:  Intact  Cognition:  WNL  Sleep:        Treatment Plan Summary: Plan Discharge Home Follow up with [facility] [place] for medication  management and therapy Take all medications as prescribed Avoid the use of alcohol and illicit drugs  Disposition: No evidence of imminent risk to self or others at present.   Patient does not meet criteria for psychiatric inpatient admission. Supportive therapy provided about ongoing stressors. Discussed crisis plan, support from social network, calling 911, coming to the Emergency Department, and calling Suicide Hotline.  Ethelene Hal, NP 03/28/2017 12:58 PM  Patient seen face-to-face for psychiatric evaluation, chart reviewed and case discussed with the physician extender and developed treatment plan. Reviewed the information documented and agree with the treatment plan. Corena Pilgrim, MD

## 2017-03-28 NOTE — [facility] Suicide Risk Assessment (Signed)
Suicide Risk Assessment  Discharge Assessment   [facility] Discharge Suicide Risk Assessment   Principal Problem: Depression Discharge Diagnoses:  Patient Active Problem List   Diagnosis Date Noted  . Postmenopausal [Z78.0] 01/22/2017  . Bipolar 1 disorder, depressed ([place]) [F31.9] 12/29/2016  . Family history of suicide [Z81.8] 11/20/2016  . Depression [F32.9] 11/20/2016  . Family history of breast cancer [Z80.3] 11/20/2016  . Adhesive capsulitis of left shoulder [M75.02] 11/19/2016  . Greater trochanteric bursitis of right hip [M70.61] 11/19/2016    Total Time spent with patient: 45 minutes  Musculoskeletal: Strength & Muscle Tone: within normal limits Gait & Station: normal Patient leans: N/A  Psychiatric Specialty Exam: Physical Exam  Constitutional: She is oriented to person, place, and time. She appears well-developed and well-nourished.  HENT:  Head: Normocephalic.  Respiratory: Effort normal.  Musculoskeletal: Normal range of motion.  Neurological: She is alert and oriented to person, place, and time.  Psychiatric: Her speech is normal. Thought content normal. She is agitated. Cognition and memory are normal. She expresses impulsivity. She exhibits a depressed mood.   Review of Systems  Psychiatric/Behavioral: Positive for depression and substance abuse. Negative for hallucinations, memory loss and suicidal ideas. The patient is not nervous/anxious and does not have insomnia.   All other systems reviewed and are negative.  Blood pressure (!) 99/55, pulse (!) 59, temperature (!) 97.3 F (36.3 C), temperature source Oral, resp. rate 20, last menstrual period 04/29/2012, SpO2 98 %.There is no height or weight on file to calculate BMI. General Appearance: Casual Eye Contact:  Good Speech:  Clear and Coherent Volume:  Normal Mood:  Depressed and Irritable Affect:  Congruent and Depressed Thought Process:  Coherent and Linear Orientation:  Full (Time, Place, and  Person) Thought Content:  Logical Suicidal Thoughts:  No Homicidal Thoughts:  No Memory:  Immediate;   Good Recent;   Good Remote;   Fair Judgement:  Good Insight:  Good Psychomotor Activity:  Normal Concentration:  Concentration: Good and Attention Span: Good Recall:  Good Fund of Knowledge:  Good Language:  Good Akathisia:  No Handed:  Right AIMS (if indicated):    Assets:  Agricultural consultant Housing Social Support ADL's:  Intact Cognition:  WNL  Mental Status Per Nursing Assessment::   On Admission:   suicidal while "high" on drugs  Demographic Factors:  Caucasian, Low socioeconomic status and Unemployed  Loss Factors: Financial problems/change in socioeconomic status  Historical Factors: Impulsivity  Risk Reduction Factors:   Sense of responsibility to family  Continued Clinical Symptoms:  Bipolar Disorder:   Depressive phase Alcohol/Substance Abuse/Dependencies More than one psychiatric diagnosis Previous Psychiatric Diagnoses and Treatments  Cognitive Features That Contribute To Risk:  Closed-mindedness    Suicide Risk:  Minimal: No identifiable suicidal ideation.  Patients presenting with no risk factors but with morbid ruminations; may be classified as minimal risk based on the severity of the depressive symptoms  Follow-up [facility] Follow up.   Specialty:  Professional Counselor Why:  Please follow up and bring discharge instructions. Open 8AM-4PM Contact information: [facility] [place] [address] [phone]           Plan Of Care/Follow-up recommendations:  Activity:  as tolerated Diet:  Heart Healthy  Ethelene Hal, NP 03/28/2017, 1:07 PM

## 2017-03-28 NOTE — ED Notes (Signed)
Pt refused abilify r/t causes muscle stiffness.

## 2017-03-28 NOTE — ED Notes (Signed)
She noted she had self harm thoughts earlier this morning. Notes not SI/HI/AVH. C/o pain BLE. She refused the motrin. Requesting percocet for pain regimen. Staff will continue to monitor, meet needs, and maintain safety.

## 2017-04-02 LAB — POCT URINALYSIS DIPSTICK
Glucose, UA: NEGATIVE
Nitrite, UA: NEGATIVE
PH UA: 5.5 (ref 5.0–8.0)
PROTEIN UA: 30
RBC UA: NEGATIVE
SPEC GRAV UA: 1.02 (ref 1.010–1.025)
UROBILINOGEN UA: 2 U/dL — AB

## 2017-04-09 MED FILL — raNITIdine HCL 150 MG TABS: 150 | 15 days supply | Qty: 30 | Fill #3

## 2017-04-09 MED FILL — traMADol HCL 50 MG TABS: 50 | 10 days supply | Qty: 30 | Fill #0

## 2017-04-12 MED FILL — CYCLOBENZAPRINE 10 MG TAB: 10 | 10 days supply | Qty: 30 | Fill #0

## 2017-04-13 ENCOUNTER — Ambulatory Visit: Payer: Self-pay | Attending: Family Medicine | Admitting: Family Medicine

## 2017-04-13 ENCOUNTER — Other Ambulatory Visit: Payer: Self-pay

## 2017-04-13 ENCOUNTER — Encounter: Payer: Self-pay | Admitting: Family Medicine

## 2017-04-13 VITALS — BP 94/63 | HR 67 | Temp 97.9°F | Resp 18 | Ht 66.0 in | Wt 176.0 lb

## 2017-04-13 DIAGNOSIS — Z09 Encounter for follow-up examination after completed treatment for conditions other than malignant neoplasm: Secondary | ICD-10-CM

## 2017-04-13 DIAGNOSIS — Z131 Encounter for screening for diabetes mellitus: Secondary | ICD-10-CM

## 2017-04-13 DIAGNOSIS — Z760 Encounter for issue of repeat prescription: Secondary | ICD-10-CM

## 2017-04-13 DIAGNOSIS — Z0000 Encounter for general adult medical examination without abnormal findings: Secondary | ICD-10-CM

## 2017-04-13 DIAGNOSIS — E119 Type 2 diabetes mellitus without complications: Secondary | ICD-10-CM | POA: Insufficient documentation

## 2017-04-13 DIAGNOSIS — Z79899 Other long term (current) drug therapy: Secondary | ICD-10-CM | POA: Insufficient documentation

## 2017-04-13 DIAGNOSIS — E782 Mixed hyperlipidemia: Secondary | ICD-10-CM

## 2017-04-13 DIAGNOSIS — Z87898 Personal history of other specified conditions: Secondary | ICD-10-CM

## 2017-04-13 DIAGNOSIS — G8929 Other chronic pain: Secondary | ICD-10-CM

## 2017-04-13 DIAGNOSIS — F319 Bipolar disorder, unspecified: Secondary | ICD-10-CM

## 2017-04-13 DIAGNOSIS — M25512 Pain in left shoulder: Secondary | ICD-10-CM | POA: Insufficient documentation

## 2017-04-13 DIAGNOSIS — Z23 Encounter for immunization: Secondary | ICD-10-CM

## 2017-04-13 DIAGNOSIS — E876 Hypokalemia: Secondary | ICD-10-CM

## 2017-04-13 DIAGNOSIS — R002 Palpitations: Secondary | ICD-10-CM | POA: Insufficient documentation

## 2017-04-13 LAB — POCT GLYCOSYLATED HEMOGLOBIN (HGB A1C): HEMOGLOBIN A1C: 5.6

## 2017-04-13 MED ORDER — PRAVASTATIN SODIUM 20 MG PO TABS: 20.0000 mg | ORAL_TABLET | Freq: Every day | ORAL | 2 refills | Status: DC

## 2017-04-13 MED ORDER — IBUPROFEN 800 MG PO TABS: 800.0000 mg | ORAL_TABLET | Freq: Three times a day (TID) | ORAL | 0 refills | Status: DC | PRN

## 2017-04-13 MED ORDER — LAMOTRIGINE 25 MG PO TABS: ORAL_TABLET | ORAL | 0 refills | Status: DC

## 2017-04-13 MED ORDER — GABAPENTIN 100 MG PO CAPS: 100.0000 mg | ORAL_CAPSULE | Freq: Three times a day (TID) | ORAL | 0 refills | Status: DC

## 2017-04-13 MED ORDER — POTASSIUM CHLORIDE ER 10 MEQ PO TBCR: 10.0000 meq | EXTENDED_RELEASE_TABLET | Freq: Every day | ORAL | 0 refills | Status: DC

## 2017-04-13 MED ORDER — OMEGA-3-ACID ETHYL ESTERS 1 G PO CAPS: 1.0000 g | ORAL_CAPSULE | Freq: Two times a day (BID) | ORAL | 2 refills | Status: DC

## 2017-04-13 MED ORDER — TRAZODONE HCL 150 MG PO TABS: 150.0000 mg | ORAL_TABLET | Freq: Every evening | ORAL | 0 refills | Status: DC | PRN

## 2017-04-13 MED FILL — PRAVASTATIN NA 20 MG TAB: 20 | 30 days supply | Qty: 30 | Fill #0

## 2017-04-13 MED FILL — POTASSIUM CL 10 MEQ TAB SA: 10 | 21 days supply | Qty: 21 | Fill #0

## 2017-04-13 MED FILL — traZODone HCL 150 MG TABS: 150 | 30 days supply | Qty: 30 | Fill #0

## 2017-04-13 MED FILL — lamoTRIgine 25 MG TABS: 25 | 21 days supply | Qty: 42 | Fill #0

## 2017-04-13 MED FILL — OMEGA-3 ETHYL ESTERS 1 GM C: 1 | 30 days supply | Qty: 60 | Fill #0

## 2017-04-13 MED FILL — GABAPENTIN 100 MG CAPSULE: 100 | 30 days supply | Qty: 90 | Fill #0

## 2017-04-13 MED FILL — IBUPROFEN 800 MG TABLET: 800 | 10 days supply | Qty: 30 | Fill #0

## 2017-04-13 NOTE — Progress Notes (Signed)
  Family services  1 month without therapy   2 weeks ago , diines EKG   Depression  symptosm withou use   Tetanus 10 years ago   Interpreter  [phone]  Recenty   ibprofen

## 2017-04-13 NOTE — Patient Instructions (Signed)
Bipolar 1 Disorder Bipolar 1 disorder is a mental health disorder in which a person has episodes of emotional highs (mania), and may also have episodes of emotional lows (depression) in addition to highs. Bipolar 1 disorder is different from other bipolar disorders because it involves extreme manic episodes. These episodes last at least one week or involve symptoms that are so severe that hospitalization is needed to keep the person safe. What increases the risk? The cause of this condition is not known. However, certain factors make you more likely to have bipolar disorder, such as:  Having a family member with the disorder.  An imbalance of certain chemicals in the brain (neurotransmitters).  Stress, such as illness, financial problems, or a death.  Certain conditions that affect the brain or spinal cord (neurologic conditions).  Brain injury (trauma).  Having another mental health disorder, such as: ? Obsessive compulsive disorder. ? Schizophrenia.  What are the signs or symptoms? Symptoms of mania include:  Very high self-esteem or self-confidence.  Decreased need for sleep.  Unusual talkativeness or feeling a need to keep talking. Speech may be very fast. It may seem like you cannot stop talking.  Racing thoughts or constant talking, with quick shifts between topics that may or may not be related (flight of ideas).  Decreased ability to focus or concentrate.  Increased purposeful activity, such as work, studies, or social activity.  Increased nonproductive activity. This could be pacing, squirming and fidgeting, or finger and toe tapping.  Impulsive behavior and poor judgment. This may result in high-risk activities, such as having unprotected sex or spending a lot of money.  Symptoms of depression include:  Feeling sad, hopeless, or helpless.  Frequent or uncontrollable crying.  Lack of feeling or caring about anything.  Sleeping too much.  Moving more slowly  than usual.  Not being able to enjoy things you used to enjoy.  Wanting to be alone all the time.  Feeling guilty or worthless.  Lack of energy or motivation.  Trouble concentrating or remembering.  Trouble making decisions.  Increased appetite.  Thoughts of death, or the desire to harm yourself.  Sometimes, you may have a mixed mood. This means having symptoms of depression and mania. Stress can make symptoms worse. How is this diagnosed? To diagnose bipolar disorder, your health care provider may ask about your:  Emotional episodes.  Medical history.  Alcohol and drug use. This includes prescription medicines. Certain medical conditions and substances can cause symptoms that seem like bipolar disorder (secondary bipolar disorder).  How is this treated? Bipolar disorder is a long-term (chronic) illness. It is best controlled with ongoing (continuous) treatment rather than treatment only when symptoms occur. Treatment may include:  Medicine. Medicine can be prescribed by a provider who specializes in treating mental disorders (psychiatrist). ? Medicines called mood stabilizers are usually prescribed. ? If symptoms occur even while taking a mood stabilizer, other medicines may be added.  Psychotherapy. Some forms of talk therapy, such as cognitive-behavioral therapy (CBT), can provide support, education, and guidance.  Coping methods, such as journaling or relaxation exercises. These may include: ? Yoga. ? Meditation. ? Deep breathing.  Lifestyle changes, such as: ? Limiting alcohol and drug use. ? Exercising regularly. ? Getting plenty of sleep. ? Making healthy eating choices.  A combination of medicine, talk therapy, and coping methods is best. A procedure in which electricity is applied to the brain through the scalp (electroconvulsive therapy) may be used in cases of severe mania when medicine   and psychotherapy work too slowly or do not work. Follow these  instructions at home: Activity   Return to your normal activities as told by your health care provider.  Find activities that you enjoy, and make time to do them.  Exercise regularly as told by your health care provider. Lifestyle  Limit alcohol intake to no more than 1 drink a day for nonpregnant women and 2 drinks a day for men. One drink equals 12 oz of beer, 5 oz of wine, or 1 oz of hard liquor.  Follow a set schedule for eating and sleeping.  Eat a balanced diet that includes fresh fruits and vegetables, whole grains, low-fat dairy, and lean meat.  Get 7-8 hours of sleep each night. General instructions  Take over-the-counter and prescription medicines only as told by your health care provider.  Think about joining a support group. Your health care provider may be able to recommend a support group.  Talk with your family and loved ones about your treatment goals and how they can help.  Keep all follow-up visits as told by your health care provider. This is important. Where to find more information: For more information about bipolar disorder, visit the following websites:  [employer] on Mental Illness: www.nami.[place]: https://carter.com/  Contact a health care provider if:  Your symptoms get worse.  You have side effects from your medicine, and they get worse.  You have trouble sleeping.  You have trouble doing daily activities.  You feel unsafe in your surroundings.  You are dealing with substance abuse. Get help right away if:  You have new symptoms.  You have thoughts about harming yourself.  You self-harm. This information is not intended to replace advice given to you by your health care provider. Make sure you discuss any questions you have with your health care provider. Document Released: 10/02/2000 Document Revised: 02/20/2016 Document Reviewed: 02/24/2016 Elsevier Interactive Patient Education  2018  [employer].  Lamotrigine tablets What is this medicine? LAMOTRIGINE (la MOE Hendricks Limes) is used to control seizures in adults and children with epilepsy and Lennox-Gastaut syndrome. It is also used in adults to treat bipolar disorder. This medicine may be used for other purposes; ask your health care provider or pharmacist if you have questions. COMMON BRAND NAME(S): Lamictal What should I tell my health care provider before I take this medicine? They need to know if you have any of these conditions: -a history of depression or bipolar disorder -aseptic meningitis during prior use of lamotrigine -folate deficiency -kidney disease -liver disease -suicidal thoughts, plans, or attempt; a previous suicide attempt by you or a family member -an unusual or allergic reaction to lamotrigine or other seizure medications, other medicines, foods, dyes, or preservatives -pregnant or trying to get pregnant -breast-feeding How should I use this medicine? Take this medicine by mouth with a glass of water. Follow the directions on the prescription label. Do not chew these tablets. If this medicine upsets your stomach, take it with food or milk. Take your doses at regular intervals. Do not take your medicine more often than directed. A special MedGuide will be given to you by the pharmacist with each new prescription and refill. Be sure to read this information carefully each time. Talk to your pediatrician regarding the use of this medicine in children. While this drug may be prescribed for children as young as 2 years for selected conditions, precautions do apply. Overdosage: If you think you  have taken too much of this medicine contact a poison control center or emergency room at once. NOTE: This medicine is only for you. Do not share this medicine with others. What if I miss a dose? If you miss a dose, take it as soon as you can. If it is almost time for your next dose, take only that dose. Do not take  double or extra doses. What may interact with this medicine? -carbamazepine -female hormones, including contraceptive or birth control pills -methotrexate -phenobarbital -phenytoin -primidone -pyrimethamine -rifampin -trimethoprim -valproic acid This list may not describe all possible interactions. Give your health care provider a list of all the medicines, herbs, non-prescription drugs, or dietary supplements you use. Also tell them if you smoke, drink alcohol, or use illegal drugs. Some items may interact with your medicine. What should I watch for while using this medicine? Visit your doctor or health care professional for regular checks on your progress. If you take this medicine for seizures, wear a Medic Alert bracelet or necklace. Carry an identification card with information about your condition, medicines, and doctor or health care professional. It is important to take this medicine exactly as directed. When first starting treatment, your dose will need to be adjusted slowly. It may take weeks or months before your dose is stable. You should contact your doctor or health care professional if your seizures get worse or if you have any new types of seizures. Do not stop taking this medicine unless instructed by your doctor or health care professional. Stopping your medicine suddenly can increase your seizures or their severity. Contact your doctor or health care professional right away if you develop a rash while taking this medicine. Rashes may be very severe and sometimes require treatment in the hospital. Deaths from rashes have occurred. Serious rashes occur more often in children than adults taking this medicine. It is more common for these serious rashes to occur during the first 2 months of treatment, but a rash can occur at any time. You may get drowsy, dizzy, or have blurred vision. Do not drive, use machinery, or do anything that needs mental alertness until you know how this  medicine affects you. To reduce dizzy or fainting spells, do not sit or stand up quickly, especially if you are an older patient. Alcohol can increase drowsiness and dizziness. Avoid alcoholic drinks. If you are taking this medicine for bipolar disorder, it is important to report any changes in your mood to your doctor or health care professional. If your condition gets worse, you get mentally depressed, feel very hyperactive or manic, have difficulty sleeping, or have thoughts of hurting yourself or committing suicide, you need to get help from your health care professional right away. If you are a caregiver for someone taking this medicine for bipolar disorder, you should also report these behavioral changes right away. The use of this medicine may increase the chance of suicidal thoughts or actions. Pay special attention to how you are responding while on this medicine. Your mouth may get dry. Chewing sugarless gum or sucking hard candy, and drinking plenty of water may help. Contact your doctor if the problem does not go away or is severe. Women who become pregnant while using this medicine may enroll in the [place] Pregnancy Registry by calling [phone]. This registry collects information about the safety of antiepileptic drug use during pregnancy. What side effects may I notice from receiving this medicine? Side effects that you should report to  your doctor or health care professional as soon as possible: -allergic reactions like skin rash, itching or hives, swelling of the face, lips, or tongue -blurred or double vision -difficulty walking or controlling muscle movements -fever -headache, stiff neck, and sensitivity to light -painful sores in the mouth, eyes, or nose -redness, blistering, peeling or loosening of the skin, including inside the mouth -severe muscle pain -swollen lymph glands -uncontrollable eye movements -unusual bruising or bleeding -unusually  weak or tired -vomiting -worsening of mood, thoughts or actions of suicide or dying -yellowing of the eyes or skin Side effects that usually do not require medical attention (report to your doctor or health care professional if they continue or are bothersome): -diarrhea or constipation -difficulty sleeping -nausea -tremors This list may not describe all possible side effects. Call your doctor for medical advice about side effects. You may report side effects to FDA at 1-800-FDA-1088. Where should I keep my medicine? Keep out of reach of children. Store at room temperature between 15 and 30 degrees C (59 and 86 degrees F). Throw away any unused medicine after the expiration date. NOTE: This sheet is a summary. It may not cover all possible information. If you have questions about this medicine, talk to your doctor, pharmacist, or health care provider.  2018 Elsevier/Gold Standard (2015-07-29 09:29:40)

## 2017-04-13 NOTE — Progress Notes (Signed)
JAPatient is here for f/up   Patient has right hi pain that comes & goes

## 2017-04-14 LAB — CBC
Hematocrit: 42.6 % (ref 34.0–46.6)
Hemoglobin: 13.6 g/dL (ref 11.1–15.9)
MCH: 30.9 pg (ref 26.6–33.0)
MCHC: 31.9 g/dL (ref 31.5–35.7)
MCV: 97 fL (ref 79–97)
PLATELETS: 289 10*3/uL (ref 150–379)
RBC: 4.4 x10E6/uL (ref 3.77–5.28)
RDW: 13.7 % (ref 12.3–15.4)
WBC: 5.6 10*3/uL (ref 3.4–10.8)

## 2017-04-14 LAB — BASIC METABOLIC PANEL
BUN / CREAT RATIO: 11 (ref 9–23)
BUN: 11 mg/dL (ref 6–24)
CHLORIDE: 104 mmol/L (ref 96–106)
CO2: 23 mmol/L (ref 20–29)
CREATININE: 1.04 mg/dL — AB (ref 0.57–1.00)
Calcium: 9.2 mg/dL (ref 8.7–10.2)
GFR calc Af Amer: 73 mL/min/{1.73_m2} (ref >59)
GFR calc non Af Amer: 63 mL/min/{1.73_m2} (ref >59)
GLUCOSE: 77 mg/dL (ref 65–99)
Potassium: 4.3 mmol/L (ref 3.5–5.2)
SODIUM: 142 mmol/L (ref 134–144)

## 2017-04-14 LAB — TSH: TSH: 1.25 u[IU]/mL (ref 0.450–4.500)

## 2017-04-14 LAB — LIPID PANEL
CHOL/HDL RATIO: 4.4 ratio (ref 0.0–4.4)
Cholesterol, Total: 206 mg/dL — ABNORMAL HIGH (ref 100–199)
HDL: 47 mg/dL (ref >39)
LDL CALC: 140 mg/dL — AB (ref 0–99)
TRIGLYCERIDES: 95 mg/dL (ref 0–149)
VLDL CHOLESTEROL CAL: 19 mg/dL (ref 5–40)

## 2017-04-19 ENCOUNTER — Other Ambulatory Visit: Payer: Self-pay | Admitting: Family Medicine

## 2017-04-19 ENCOUNTER — Telehealth: Payer: Self-pay

## 2017-04-19 DIAGNOSIS — E782 Mixed hyperlipidemia: Secondary | ICD-10-CM

## 2017-04-19 MED ORDER — PRAVASTATIN SODIUM 40 MG PO TABS: 40.0000 mg | ORAL_TABLET | Freq: Every day | ORAL | 6 refills | Status: DC

## 2017-04-19 MED FILL — ?PRAVASTATIN SODIUM 40MG TA: 40 | 30 days supply | Qty: 30 | Fill #0

## 2017-04-19 NOTE — Telephone Encounter (Signed)
-----   Message from Alfonse Spruce, [place] sent at 04/19/2017  1:13 PM EDT ----- Labs that evaluated your blood cells, fluid and electrolyte balance are normal. No signs of anemia. Kidney function normal. Creatine levels slightly elevated. Increase water intake. Thyroid function normal. When abnormal it can cause cardiovascular symptoms. Lipid levels have improved since last check but are still elevated. Your dose of pravastatin will be increased. This can increase your risk of heart disease overtime. Start eating a diet low in saturated fat. Limit your intake of fried foods, red meats, and whole milk.

## 2017-04-19 NOTE — Telephone Encounter (Signed)
CMA call regarding lab results  Patient did not answer but left a detailed message per DPR if have any questions just to call back  

## 2017-04-20 ENCOUNTER — Encounter (HOSPITAL_COMMUNITY): Payer: Self-pay

## 2017-04-20 ENCOUNTER — Encounter: Payer: No Typology Code available for payment source | Admitting: Vascular Surgery

## 2017-04-20 NOTE — Progress Notes (Signed)
Subjective:  Patient ID: Katie Sherman, female    DOB: Nov 10, 1965  Age: 51 y.o. MRN: [id number]  CC: Follow-up   HPI Katie Sherman presents for follow up. She chronic shoulder pain: 3 months. Reports aching with decreased ROM. reports pain makes it difficult to sleep at night. Denies any history of injury or heaving lifting. Denies any paresthesia symptoms. History bipolar.  She complains of depressed mood, feelings of worthlessness/guilt, recurrent thoughts of death and suicidal thoughts with specific plan. Onset was approximately several years ago, gradually worsening since that time.  She denies current suicidal and homicidal plan or intent.   Family history significant for depression and suicide.Possible organic causes contributing are: none. She does reports symptoms of heart palpitations. She denies any near syncope, last episode 2 weeks ago.  Risk factors: positive family history in  brother(s) and ex husband, negative life event including the suicides of family and close friends. She reports [facility]. She report not follow up for a few months. She reports not taking medications for 1 month. She declines to speaking with LCSW today.    Outpatient Medications Prior to Visit  Medication Sig Dispense Refill  . cephALEXin (KEFLEX) 500 MG capsule Take 3 times daily for 7 days. (Patient not taking: Reported on 03/27/2017) 21 capsule 0  . cyclobenzaprine (FLEXERIL) 10 MG tablet Take 1 tablet (10 mg total) by mouth 3 (three) times daily as needed for muscle spasms. 30 tablet 0  . HYDROcodone-acetaminophen (NORCO/VICODIN) 5-325 MG tablet Take 1-2 tablets by mouth every 6 (six) hours as needed for severe pain. (Patient not taking: Reported on 03/27/2017) 10 tablet 0  . LORazepam (ATIVAN) 0.5 MG tablet Take 1 tablet (0.5 mg total) by mouth every 6 (six) hours as needed for anxiety. (Patient not taking: Reported on 03/27/2017) 10 tablet 0  .  nicotine (NICODERM CQ - DOSED IN MG/24 HOURS) 14 mg/24hr patch Place 1 patch (14 mg total) onto the skin daily. For smoking cessation (Patient not taking: Reported on 03/27/2017) 21 patch 1  . ranitidine (ZANTAC) 150 MG tablet Take 1 tablet (150 mg total) by mouth 2 (two) times daily as needed for heartburn. 30 tablet 6  . traMADol (ULTRAM) 50 MG tablet Take 1 tablet (50 mg total) by mouth every 8 (eight) hours as needed. (Patient taking differently: Take 50 mg by mouth every 8 (eight) hours as needed for moderate pain. ) 30 tablet 0  . ARIPiprazole (ABILIFY) 15 MG tablet Take 1 tablet (15 mg total) by mouth daily. For mood control 30 tablet 0  . ARIPiprazole (ABILIFY) 20 MG tablet Take 20 mg by mouth daily.  0  . gabapentin (NEURONTIN) 100 MG capsule Take 1 capsule (100 mg total) by mouth 3 (three) times daily. For agitation 90 capsule 0  . ibuprofen (ADVIL,MOTRIN) 800 MG tablet TAKE 1 TABLET (800 MG TOTAL) BY MOUTH EVERY 8 (EIGHT) HOURS AS NEEDED FOR MODERATE PAIN (TAKE WITH FOOD). 30 tablet 0  . omega-3 acid ethyl esters (LOVAZA) 1 g capsule Take 1 capsule (1 g total) by mouth 2 (two) times daily. For high cholesterol 60 capsule 2  . pravastatin (PRAVACHOL) 20 MG tablet Take 1 tablet (20 mg total) by mouth daily at 6 PM. For high cholesterol 30 tablet 2  . traZODone (DESYREL) 150 MG tablet Take 1 tablet (150 mg total) by mouth at bedtime as needed for sleep. 30 tablet 0   No facility-administered medications prior to visit.  ROS Review of Systems  Constitutional: Negative.   Eyes: Negative.   Respiratory: Negative.   Cardiovascular: Negative.   Gastrointestinal: Negative.   Musculoskeletal: Positive for arthralgias.  Skin: Negative.   Psychiatric/Behavioral: Positive for dysphoric mood (history of bipolar disorder). Negative for suicidal ideas.    Objective:  BP 94/63 (BP Location: Left Arm, Patient Position: Sitting, Cuff Size: Normal)   Pulse 67   Temp 97.9 F (36.6 C) (Oral)    Resp 18   Ht 5\' 6"  (1.676 m)   Wt 176 lb (79.8 kg)   LMP 04/29/2012   SpO2 97%   BMI 28.41 kg/m   BP/Weight 04/13/2017 03/28/2017 0/53/9767  Systolic BP 94 341 937  Diastolic BP 63 62 69  Wt. (Lbs) 176 - 184  BMI 28.41 - 29.7  Some encounter information is confidential and restricted. Go to Review Flowsheets activity to see all data.     Physical Exam  Constitutional: She appears well-developed and well-nourished.  HENT:  Head: Normocephalic and atraumatic.  Right Ear: External ear normal.  Left Ear: External ear normal.  Nose: Nose normal.  Mouth/Throat: Oropharynx is clear and moist.  Eyes: Pupils are equal, round, and reactive to light. Conjunctivae are normal.  Cardiovascular: Normal rate, regular rhythm, normal heart sounds and intact distal pulses.   Pulmonary/Chest: Effort normal and breath sounds normal.  Abdominal: Soft. Bowel sounds are normal. There is no tenderness.  Musculoskeletal:       Lumbar back: She exhibits pain (left sided ).  Skin: Skin is warm and dry. No erythema.  Psychiatric: She expresses no homicidal and no suicidal ideation. She expresses no suicidal plans and no homicidal plans.  Nursing note and vitals reviewed.   Assessment & Plan:  1. Follow up   2. Diabetes mellitus screening  - HgB A1c  3. Bipolar 1 disorder, depressed ([place])  - traZODone (DESYREL) 150 MG tablet; Take 1 tablet (150 mg total) by mouth at bedtime as needed for sleep.  Dispense: 30 tablet; Refill: 0 - lamoTRIgine (LAMICTAL) 25 MG tablet; TAKE ONE TABLET BY MOUTH ONCE DAILY FOR 14 DAYS. MAY INCREASE TO TWO TABLETS BY MOUTH ONCE A DAILY.  Dispense: 42 tablet; Refill: 0 - Ambulatory referral to Psychiatry  4. Chronic left shoulder pain  - ibuprofen (ADVIL,MOTRIN) 800 MG tablet; Take 1 tablet (800 mg total) by mouth every 8 (eight) hours as needed for moderate pain (Take with food).  Dispense: 30 tablet; Refill: 0  5. Medication refill  - gabapentin (NEURONTIN) 100 MG  capsule; Take 1 capsule (100 mg total) by mouth 3 (three) times daily. For agitation  Dispense: 90 capsule; Refill: 0  6. Mixed hyperlipidemia  - Lipid Panel - omega-3 acid ethyl esters (LOVAZA) 1 g capsule; Take 1 capsule (1 g total) by mouth 2 (two) times daily. For high cholesterol  Dispense: 60 capsule; Refill: 2  7. Healthcare maintenance  - Tdap vaccine greater than or equal to 7yo IM  8. History of palpitations  - EKG 12-Lead - CBC - Basic Metabolic Panel - TSH  9. Hypokalemia Per last lab potassium level decreased - potassium chloride (K-DUR) 10 MEQ tablet; Take 1 tablet (10 mEq total) by mouth daily.  Dispense: 21 tablet; Refill: 0    Follow-up: Return in about 3 months (around 07/14/2017), or if symptoms worsen or fail to improve, for HLD.   Alfonse Spruce FNP

## 2017-04-23 ENCOUNTER — Telehealth: Payer: Self-pay

## 2017-04-23 ENCOUNTER — Telehealth: Payer: Self-pay | Admitting: Family Medicine

## 2017-04-23 DIAGNOSIS — M545 Low back pain, unspecified: Secondary | ICD-10-CM

## 2017-04-23 DIAGNOSIS — F319 Bipolar disorder, unspecified: Secondary | ICD-10-CM

## 2017-04-23 MED ORDER — TRAZODONE HCL 50 MG PO TABS: 150.0000 mg | ORAL_TABLET | Freq: Every evening | ORAL | 0 refills | Status: DC | PRN

## 2017-04-23 MED ORDER — CYCLOBENZAPRINE HCL 10 MG PO TABS: 10.0000 mg | ORAL_TABLET | Freq: Three times a day (TID) | ORAL | 0 refills | Status: DC | PRN

## 2017-04-23 MED FILL — raNITIdine HCL 150 MG TABS: 150 | 15 days supply | Qty: 30 | Fill #4

## 2017-04-23 MED FILL — traZODone HCL 50 MG TABS: 50 | 30 days supply | Qty: 90 | Fill #0

## 2017-04-23 MED FILL — ?CYCLOBENZAPRINE 10 MG TABL: 10 | 10 days supply | Qty: 30 | Fill #0

## 2017-04-23 NOTE — Telephone Encounter (Signed)
Reordered as 50 mg tablets -take 3 tablets to equal 150 dose.

## 2017-04-23 NOTE — Telephone Encounter (Signed)
Pt called to is possible to change her prescription of traZODone (DESYREL) 150 MG tablet   to 100mg  table since the 150 is too big and she can not take that, please follow up, see if you can prescribe is a smaller dose like 100 and 50 mg since she suppose take 150mg  total, if you have any question please follow up

## 2017-04-23 NOTE — Telephone Encounter (Signed)
Pt contacted the office and is reuqesting a refill on her flexeril 10mg  and would like it to be sent to [facility]. Please f/u

## 2017-04-23 NOTE — Telephone Encounter (Signed)
Refilled

## 2017-04-25 ENCOUNTER — Encounter: Payer: Self-pay | Admitting: Family Medicine

## 2017-04-25 ENCOUNTER — Ambulatory Visit (INDEPENDENT_AMBULATORY_CARE_PROVIDER_SITE_OTHER): Payer: Self-pay | Admitting: Family Medicine

## 2017-04-25 VITALS — BP 110/72 | Ht 67.0 in | Wt 170.0 lb

## 2017-04-25 DIAGNOSIS — M7061 Trochanteric bursitis, right hip: Secondary | ICD-10-CM

## 2017-04-25 DIAGNOSIS — M7502 Adhesive capsulitis of left shoulder: Secondary | ICD-10-CM

## 2017-04-25 MED ORDER — METHYLPREDNISOLONE ACETATE 40 MG/ML IJ SUSP
40.0000 mg | Freq: Once | INTRAMUSCULAR | Status: AC
  Administered 2017-04-25: 40 mg via INTRA_ARTICULAR

## 2017-04-25 NOTE — Progress Notes (Signed)
Chief complaint: Left shoulder pain 10 months  History of present illness: Katie Sherman is 51 year old female who presents to the sports medicine office today for follow-up of left shoulder pain. She does have known history of adhesive capsulitis, this is been ongoing since January of this past year. In review, she does not report of any inciting incident, trauma, or injury due to cause the pain and decreased mobility that she is having. She did have glenohumeral cortisone injection back in May of this past year, she did have symptom relief for approximately 2.5 months. She does request to have repeat injection today. It was recommended for her to see physical therapy, but she declined and wanted do therapy on her own. She reports that she is amenable to doing physical therapy today. Does not report of any interval injury, trauma, or inciting factor to cause the continued stiffness she is having. She does not report of any numbness, tingling or burning paresthesias. She does not report of any pain today, just reports of stiffness and decreased range of motion. She is right hand dominant.  Review of systems:  As stated above  Interval past medical history, surgical history, family history, and social history obtained and unchanged.  Physical exam: Vital signs are reviewed and are documented in the chart Gen.: Alert, oriented, appears stated age, in no apparent distress HEENT: Moist oral mucosa Respiratory: Normal respirations, able to speak in full sentences Cardiac: Regular rate, distal pulses 2+ Integumentary: No rashes on visible skin:  Neurologic: Strength 5/5, sensation 2+ in bilateral upper extremities Psych: Normal affect, mood is described as good Musculoskeletal: Inspection of her left shoulder reveals no obvious deformity or muscle atrophy, no warmth, erythema, ecchymosis, or effusion noted, no tenderness to palpation today along clavicle, AC joint, acromion, head of biceps, triceps,  deltoid, slight tenderness to deep palpation noted along the scapular spine, no cervical paraspinal tenderness to palpation over midline cervical spine all tenderness to palpation, she does have decreased range of motion in her left shoulder, is only able to go to 90 of forward flexion and abduction, external range of motion is to 45, internal range of motion is to 45, rotator cuff impingement testing negative, Neer, Hawkins, Empty Can negative, she cannot fully adduct her arm across her body to perform crossarm testing, speed's and Yergason's and negative, Spurling negative  Procedure note: Discussed option of cortisone injection today in the left subacromial bursa. She does agree to this today. Prior to procedure, written informed consent was obtained, outlining benefits and risks, with risks including bleeding, infection, and steroid flare. After informed consent signed by the patient, Betadine was used to clean off the skin. Alcohol was additionally used. Using 22-gauge needle 4 cc of 1% lidocaine and 2 cc depo-Medrol  was used to inject the left subacromial bursa from inferolateral approach. Area that was injected was 1 cm inferior and medial from the acromion from posterior shoulder. No complications were noted. Band-Aid was applied afterward. Patient was observed for 5-10 minutes following injection.  Assessment and plan: 1. Left adhesive capsulitis 2. Right greater trochanteric bursitis  Left adhesive capsulitis -She does request for second injection today, discussed will try for subacromial injection today to see if this provides her any additional relief of symptoms -Did discuss with her that this would be last time she'll have cortisone injection for this issue, she does understand this -Did discuss physical therapy referral, discussed this would be really beneficial for her to increase her range of motion, she is  agreeable to this and thus will have this set up -Did discuss realistic  timeframe that symptoms could last for 12-18 months from symptom onset, oftentimes with spontaneous recovery without any meaningful intervention  She does also discuss that she is still having right hip pain from right greater trochanter bursitis. Discussed with her that it is too early to discuss repeat cortisone injection. Discussed that physical therapy would also help out with this and placed referral to have this addressed at physical therapy as well.  She will follow-up in 4 weeks or sooner as needed. No medications were asked for or given today.   Mort Sawyers, M.D. [place]

## 2017-05-01 ENCOUNTER — Ambulatory Visit: Payer: No Typology Code available for payment source | Attending: Family Medicine | Admitting: Physical Therapy

## 2017-05-02 ENCOUNTER — Encounter (HOSPITAL_COMMUNITY): Payer: Self-pay

## 2017-05-02 ENCOUNTER — Encounter: Payer: No Typology Code available for payment source | Admitting: Vascular Surgery

## 2017-05-08 ENCOUNTER — Telehealth: Payer: Self-pay | Admitting: Family Medicine

## 2017-05-08 NOTE — Telephone Encounter (Signed)
Patient needs a refill on lamictal and would like to know if she needs to go up on the medication to 75mg  not 50. Please fu

## 2017-05-09 ENCOUNTER — Other Ambulatory Visit: Payer: Self-pay | Admitting: Family Medicine

## 2017-05-09 DIAGNOSIS — F319 Bipolar disorder, unspecified: Secondary | ICD-10-CM

## 2017-05-09 MED ORDER — LAMOTRIGINE 100 MG PO TABS: ORAL_TABLET | ORAL | 1 refills | Status: DC

## 2017-05-09 NOTE — Telephone Encounter (Signed)
Spoke with patient & she is also requesting a antidepressant medication she stated that she does great with Wellbutrin

## 2017-05-09 NOTE — Telephone Encounter (Signed)
Medication sent to [facility] pharmacy. Increased dosage of Lamictal to 100 mg daily. Recommend she keep follow up appointment.

## 2017-05-09 NOTE — Telephone Encounter (Signed)
Lamictal helps w/ bipolar and depression. Dosage was increased. Would not recommend Wellbutrin at this time. Recommend she keeps follow up appointment.

## 2017-05-09 NOTE — Telephone Encounter (Signed)
Will increase dosage to 100 mg per day. Recommend she keep follow up in office appointment.

## 2017-05-09 NOTE — Telephone Encounter (Signed)
Will forward to PCP 

## 2017-05-09 NOTE — Telephone Encounter (Signed)
Patient called to check on her refill. She wants to let you know she has two days lest of her medication.  lamictal and would like to know if she needs to go up on the medication to 75mg  please follow up.

## 2017-05-09 NOTE — Telephone Encounter (Signed)
CMA call patient regarding medication sent to pharmacy   Patient was aware and understood

## 2017-05-11 ENCOUNTER — Other Ambulatory Visit: Payer: Self-pay | Admitting: Family Medicine

## 2017-05-11 DIAGNOSIS — Z760 Encounter for issue of repeat prescription: Secondary | ICD-10-CM

## 2017-05-11 DIAGNOSIS — M545 Low back pain, unspecified: Secondary | ICD-10-CM

## 2017-05-11 MED FILL — raNITIdine HCL 150 MG TABS: 150 | 15 days supply | Qty: 30 | Fill #5

## 2017-05-11 MED FILL — OMEGA-3 ETHYL ESTERS 1 GM C: 1 | 30 days supply | Qty: 60 | Fill #1

## 2017-05-11 MED FILL — GABAPENTIN 100 MG CAPSULE: 100 | 30 days supply | Qty: 90 | Fill #0

## 2017-05-11 MED FILL — lamoTRIgine 100 MG TABS: 100 | 30 days supply | Qty: 30 | Fill #0

## 2017-05-11 MED FILL — ?CYCLOBENZAPRINE 10 MG TABL: 10 | 10 days supply | Qty: 30 | Fill #0

## 2017-05-14 NOTE — Telephone Encounter (Signed)
CMA call regarding to advice patient regarding a medication   Patient was aware and understood

## 2017-05-18 ENCOUNTER — Ambulatory Visit: Payer: Self-pay | Attending: Family Medicine

## 2017-05-22 ENCOUNTER — Telehealth: Payer: Self-pay | Admitting: Family Medicine

## 2017-05-22 NOTE — Telephone Encounter (Signed)
Pt came in to request a refill on  traZODone (DESYREL) 50 MG tablet Please follow up.

## 2017-05-23 ENCOUNTER — Ambulatory Visit (INDEPENDENT_AMBULATORY_CARE_PROVIDER_SITE_OTHER): Payer: Self-pay | Admitting: Family Medicine

## 2017-05-23 ENCOUNTER — Encounter: Payer: Self-pay | Admitting: Family Medicine

## 2017-05-23 ENCOUNTER — Other Ambulatory Visit: Payer: Self-pay | Admitting: Family Medicine

## 2017-05-23 ENCOUNTER — Ambulatory Visit: Payer: Self-pay

## 2017-05-23 VITALS — BP 108/80 | Ht 66.5 in | Wt 178.0 lb

## 2017-05-23 DIAGNOSIS — F99 Mental disorder, not otherwise specified: Principal | ICD-10-CM

## 2017-05-23 DIAGNOSIS — F5105 Insomnia due to other mental disorder: Secondary | ICD-10-CM

## 2017-05-23 DIAGNOSIS — M7502 Adhesive capsulitis of left shoulder: Secondary | ICD-10-CM

## 2017-05-23 DIAGNOSIS — M25559 Pain in unspecified hip: Secondary | ICD-10-CM

## 2017-05-23 MED ORDER — DICLOFENAC SODIUM 75 MG PO TBEC: 75.0000 mg | DELAYED_RELEASE_TABLET | Freq: Two times a day (BID) | ORAL | 0 refills | Status: DC

## 2017-05-23 MED ORDER — TRAZODONE HCL 50 MG PO TABS: 150.0000 mg | ORAL_TABLET | Freq: Every evening | ORAL | 0 refills | Status: DC | PRN

## 2017-05-23 MED FILL — ?DICLOFENAC SOD DR 75 MG TA: 75 | 15 days supply | Qty: 30 | Fill #0

## 2017-05-23 MED FILL — traZODone HCL 50 MG TABS: 50 | 30 days supply | Qty: 90 | Fill #0

## 2017-05-23 NOTE — Progress Notes (Signed)
Chief complaint: Follow-up of left shoulder pain 11 months, right hip pain 12 months  History of present illness: Katie Sherman is a 51 year old female who presents to the sports medicine office today for follow-up of left shoulder pain as well as right hip pain. She does have a known diagnosis of left shoulder adhesive capsulitis. She does report interval improvement in her symptoms today in regards to her left shoulder, reports still has decreased range of motion and having pain with certain movements. She reports specifically any type of shoulder extension or abduction causes pain. 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 She reports that she is now on a routine schedule and will call physical therapy to set up an appointment after leaving the office today. She does not report of any interval injury or trauma. She does not report of any numbness, tingling, or burning paresthesias.  She also today reports of continued pain in the right lateral hip. She does have known diagnosis of right greater trochanter bursitis. It was discussed to have her do physical therapy for this as well. She does not report of any interval changes. She reports any type of prolonged standing causes pain. No numbness, tingling, or burning paresthesias going down her leg. Has been using 800 mg ibuprofen, with very mild relief of symptoms. She did have injection a few months ago, does not report of any interval improvement with her symptoms after that.  Review of systems:  As stated above  Interval past medical history, surgical history, family history, and social history obtained and unchanged.  No history of diabetes, does report of current tobacco use, no surgery involving right hip or left shoulder, family history notable for heart disease.  Physical exam: Vital signs are reviewed and are  documented in the chart Gen.: Alert, oriented, appears stated age, in no apparent distress HEENT: Moist oral mucosa Respiratory: Normal respirations, able to speak in full sentences Cardiac: Regular rate, distal pulses 2+ Integumentary: No rashes on visible skin:  Neurologic: Strength 5/5, sensation 2+ bilateral upper extremities and lower extremities Psych: Normal affect, mood is described as good Musculoskeletal: Inspection of her left shoulder reveals no obvious deformity or muscle atrophy, no warmth, erythema, ecchymosis, or effusion noted, no tenderness to palpation today along clavicle, AC joint, acromion, head of biceps, triceps, deltoid, slight tenderness to deep palpation noted along the superior aspect of scapular spine and trapezius, she continues to have decreased range of motion in her left shoulder, improved though, with is only able to go to 100 of forward flexion and abduction, external range of motion is to 45, internal range of motion is to 45, rotator cuff impingement testing negative, Neer, Hawkins, Empty Can negative, she still cannot fully adduct her arm across her body to perform crossarm testing, Speed's and Yergason's and negative, Spurling negative;she does continue to have tenderness to palpation on the lateral aspect of the right hip over the greater trochanter bursa, no tenderness to palpation over anterior hip and posterior hip, no tenderness over lumbar spine, paraspinal lumbar processes, or SI joint bilaterally  Assessment and plan: 1. Left shoulder adhesive capsulitis 2. Right greater trochanteric pain syndrome  Plan: I discussed with Bernarda today that mainstay of therapy from here on out will be to do physical therapy. I discussed that at least for the left shoulder adhesive capsulitis she needs to commit  to doing formal physical therapy to help with increased range of motion. Discussed that she has had 2 rounds of cortisone injection to her left shoulder  already and that any further injection would not be helpful. I discussed that after doing formal physical therapy for 8 weeks, if she does not have any interval improvement in her symptoms will consider getting a MRI of her left shoulder for further evaluation and ensure of no large rotator cuff tear that would require surgical intervention. I also discussed with her for her right hip pain that this is still consistent right greater trochanter bursitis/pain syndrome. Discussed that it is too early to consider doing an injection, discussed that at this time would probably not be helpful for her. Discussed mainstay would need to be physical therapy for the iliopsoas muscles and IT band. Will prescribe diclofenac 75 mg twice daily to help out with pain to help her be able to get the most out of physical therapy. Discussed with her not to use any other NSAIDs including ibuprofen, Motrin, or Aleve while she is on diclofenac. I will plan to see her back in about 8 weeks or sooner as needed.   Mort Sawyers, M.D. [place]

## 2017-05-23 NOTE — Telephone Encounter (Signed)
CMA call regarding medication refill is already been sent to pharmacy  Patient was aware and understood

## 2017-05-23 NOTE — Telephone Encounter (Signed)
Refill sent to [facility] pharmacy

## 2017-05-28 ENCOUNTER — Other Ambulatory Visit: Payer: Self-pay | Admitting: Family Medicine

## 2017-05-28 DIAGNOSIS — M545 Low back pain, unspecified: Secondary | ICD-10-CM

## 2017-05-29 ENCOUNTER — Other Ambulatory Visit: Payer: Self-pay | Admitting: Family Medicine

## 2017-05-29 DIAGNOSIS — M545 Low back pain, unspecified: Secondary | ICD-10-CM

## 2017-05-30 MED FILL — ?CYCLOBENZAPRINE 10 MG TABL: 10 | 10 days supply | Qty: 30 | Fill #0

## 2017-06-04 ENCOUNTER — Telehealth: Payer: Self-pay | Admitting: Family Medicine

## 2017-06-04 MED FILL — raNITIdine HCL 150 MG TABS: 150 | 15 days supply | Qty: 30 | Fill #6

## 2017-06-04 MED FILL — ?PRAVASTATIN SODIUM 40MG TA: 40 | 30 days supply | Qty: 30 | Fill #1

## 2017-06-04 MED FILL — lamoTRIgine 100 MG TABS: 100 | 30 days supply | Qty: 30 | Fill #1

## 2017-06-04 NOTE — Telephone Encounter (Signed)
Pt. Called requesting a refill on gabapentin (NEURONTIN) 100 MG capsule Pt. Uses [facility] pharmacy. Please f/u

## 2017-06-05 ENCOUNTER — Other Ambulatory Visit: Payer: Self-pay | Admitting: Family Medicine

## 2017-06-05 ENCOUNTER — Ambulatory Visit: Payer: Self-pay | Attending: Family Medicine

## 2017-06-05 ENCOUNTER — Other Ambulatory Visit: Payer: Self-pay

## 2017-06-05 DIAGNOSIS — M6281 Muscle weakness (generalized): Secondary | ICD-10-CM | POA: Insufficient documentation

## 2017-06-05 DIAGNOSIS — M25551 Pain in right hip: Secondary | ICD-10-CM | POA: Insufficient documentation

## 2017-06-05 DIAGNOSIS — Z760 Encounter for issue of repeat prescription: Secondary | ICD-10-CM

## 2017-06-05 DIAGNOSIS — F319 Bipolar disorder, unspecified: Secondary | ICD-10-CM

## 2017-06-05 DIAGNOSIS — M25512 Pain in left shoulder: Secondary | ICD-10-CM | POA: Insufficient documentation

## 2017-06-05 DIAGNOSIS — M25612 Stiffness of left shoulder, not elsewhere classified: Secondary | ICD-10-CM | POA: Insufficient documentation

## 2017-06-05 DIAGNOSIS — G8929 Other chronic pain: Secondary | ICD-10-CM | POA: Insufficient documentation

## 2017-06-05 MED ORDER — DICLOFENAC SODIUM 75 MG PO TBEC: 75.0000 mg | DELAYED_RELEASE_TABLET | Freq: Two times a day (BID) | ORAL | 1 refills | Status: DC

## 2017-06-05 MED ORDER — GABAPENTIN 100 MG PO CAPS: ORAL_CAPSULE | ORAL | 1 refills | Status: DC

## 2017-06-05 NOTE — Telephone Encounter (Signed)
Medication refill sent to [facility] pharmacy.

## 2017-06-05 NOTE — Telephone Encounter (Signed)
CMA call regarding medication refill been sent to our pharmacy  Patient did not answer but left a detailed message & if have any questions just to call back

## 2017-06-05 NOTE — Therapy (Signed)
[place], [place], [address] Phone: [phone]   Fax:  [phone]  Physical Therapy Evaluation  Patient Details  Name: Katie Sherman MRN: [id number] Date of Birth: Aug 04, 1965 Referring Provider: Lilia Argue DO   Encounter Date: 06/05/2017  PT End of Session - 06/05/17 1522    Visit Number  1    Number of Visits  16    Date for PT Re-Evaluation  07/27/17    Authorization Type  CAFA    PT Start Time  0300    PT Stop Time  0345    PT Time Calculation (min)  45 min    Activity Tolerance  Patient tolerated treatment well    Behavior During Therapy  [facility] for tasks assessed/performed       Past Medical History:  Diagnosis Date  . Cervical cancer ([place])   . Cervical cancer ([place])   . History of exercise stress test    ETT-Echo 5/18: normal EF, no ischemia  . History of loop recorder    currently on person  . Kidney stone     Past Surgical History:  Procedure Laterality Date  . CERVICAL CONE BIOPSY    . TUBAL LIGATION      There were no vitals filed for this visit.   Subjective Assessment - 06/05/17 1503    Subjective  She reports RT hip bursitis and pain and stiffness of LT shoulder.   She reports hormonal imbalance 07/2016 onset of pain.    She has received cortisone shots into Lt shoulder.  Pain better post.   . She has exercises she does in shower.     Limitations  Lifting;Walking unable to reach behind to release bra.   difficulty  reaching into cabinet.   reaching seat belt.  crossing legs     How long can you sit comfortably?  30 min    How long can you stand comfortably?  60 min    How long can you walk comfortably?  2 blocks    Diagnostic tests  xrays LT shoulder    Patient Stated Goals  Releive pain improve movement.     Currently in Pain?  Yes    Pain Score  2     Pain Location  Hip    Pain Orientation  Right;Lateral    Pain Descriptors / Indicators  Stabbing;Shooting    Pain  Type  Chronic pain    Pain Onset  More than a month ago    Pain Frequency  Intermittent    Aggravating Factors   crossing legs , being on feet    Pain Relieving Factors  volatrin, rest    Multiple Pain Sites  Yes    Pain Score  0    Pain Location  Shoulder    Pain Orientation  Left;Anterior    Pain Descriptors / Indicators  -- unbearable    Pain Type  Chronic pain    Pain Onset  More than a month ago    Pain Frequency  Intermittent    Aggravating Factors   moving certain directions    Pain Relieving Factors  not moving arm         [facility] PT Assessment - 06/05/17 0001      Assessment   Medical Diagnosis  Lt shoulder adhesive capsulitis, Rt hip bursitis    Referring Provider  Lilia Argue DO    Onset Date/Surgical Date  -- shoulder 07/2016,  12 years hip    Next MD Visit  6 weeks    Prior Therapy  No      Precautions   Precautions  None      Restrictions   Weight Bearing Restrictions  No      Balance Screen   Has the patient fallen in the past 6 months  Yes    How many times?  1 last 4 weeks ago    Has the patient had a decrease in activity level because of a fear of falling?   No    Is the patient reluctant to leave their home because of a fear of falling?   No      Prior Function   Level of Independence  Independent    Vocation  Full time employment    Vocation Requirements  stands on feet, reaches and lifts      Cognition   Overall Cognitive Status  Within Functional Limits for tasks assessed      ROM / Strength   AROM / PROM / Strength  AROM;PROM;Strength      AROM   AROM Assessment Site  Shoulder    Right/Left Shoulder  Left    Left Shoulder Flexion  115 Degrees    Left Shoulder ABduction  80 Degrees    Left Shoulder Internal Rotation  40 Degrees    Left Shoulder External Rotation  50 Degrees    Left Shoulder Horizontal ABduction  0 Degrees    Left Shoulder Horizontal ADduction  92 Degrees      PROM   PROM Assessment Site  Shoulder    Right/Left  Shoulder  Left    Left Shoulder Extension  45 Degrees    Left Shoulder Flexion  135 Degrees    Left Shoulder ABduction  135 Degrees    Left Shoulder Internal Rotation  65 Degrees    Left Shoulder External Rotation  90 Degrees    Left Shoulder Horizontal ABduction  5 Degrees    Left Shoulder Horizontal ADduction  95 Degrees      Strength   Overall Strength Comments  UE strength WNL both shoulders      Flexibility   Soft Tissue Assessment /Muscle Length  yes    Hamstrings  RT and LT passive 80 degrees    ITB  + RT with pain      Palpation   Palpation comment  Tender glut med and piriformis      Ambulation/Gait   Gait Comments  WNL             Objective measurements completed on examination: See above findings.              PT Education - 06/05/17 1755    Education provided  Yes    Education Details  POC    Person(s) Educated  Patient    Methods  Explanation    Comprehension  Verbalized understanding       PT Short Term Goals - 06/05/17 1516      PT SHORT TERM GOAL #1   Title  She will be independent with initial HEP    Time  3    Period  Weeks    Status  New      PT SHORT TERM GOAL #2   Title  she will repor tpain in LTR hio decr 30% or more  with wlaking and standing    Time  4    Period  Weeks  Status  New      PT SHORT TERM GOAL #3   Title  She will be able to walk 3 blocks before incr hip pain    Time  4    Period  Weeks    Status  New      PT SHORT TERM GOAL #4   Title  She will be able to wortk with 30% decreased Lt shoulder pain    Time  4    Period  Weeks    Status  New      PT SHORT TERM GOAL #5   Title  She will reach overhead with 130 degree flexion     Time  4    Period  Weeks    Status  New        PT Long Term Goals - 06/05/17 1539      PT LONG TERM GOAL #1   Title  She will be independent with all hEp issued    Time  8    Period  Weeks    Status  New      PT LONG TERM GOAL #2   Title  She will have full LT  shouleractive ROM with 1-2 max pain    Time  8    Period  Weeks    Status  New      PT LONG TERM GOAL #3   Title  She will be able to reach into cabinets with 1-2 max pain    Time  8    Period  Weeks    Status  New      PT LONG TERM GOAL #4   Title  She will be able to walk community with 2-3 max pain RT hip    Time  8    Period  Weeks    Status  New      PT LONG TERM GOAL #5   Title  Foto score for hip will improve from 54% limited with hip to 40% limited or less    Time  8    Period  Weeks    Status  New             Plan - 06/05/17 1800    Clinical Impression Statement  Ms Simmering presents with long term chronic hip and shoulder pain.   She has stiffness of shoulder with pain and sitffness or hip with pain. Isometrically she demo good strength in shoulder and hip.  She was generally tender around shoulder and RT hip soft tissues    History and Personal Factors relevant to plan of care:  Chronic hip and knee pain. Depression , Bipoloar    Clinical Presentation  Unstable    Clinical Decision Making  Moderate    Rehab Potential  Fair    Clinical Impairments Affecting Rehab Potential  chronicity of problems    PT Frequency  2x / week    PT Duration  8 weeks    PT Treatment/Interventions  Cryotherapy;Electrical Stimulation;Iontophoresis 4mg /ml Dexamethasone;Moist Heat;Ultrasound;Therapeutic activities;Therapeutic exercise;Manual techniques;Dry needling;Taping;Patient/family education    PT Next Visit Plan  Start exer for home LT shoulder isometrics, LT hip isometrics.  stretchingSTW to shoulder and hip, modalites    Consulted and Agree with Plan of Care  Patient       Patient will benefit from skilled therapeutic intervention in order to improve the following deficits and impairments:  Difficulty walking, Decreased activity tolerance, Decreased range of motion, Increased muscle spasms, Postural dysfunction, Pain  Visit Diagnosis: Pain in right hip  Chronic left shoulder  pain  Stiffness of left shoulder, not elsewhere classified  Muscle weakness (generalized)     Problem List Patient Active Problem List   Diagnosis Date Noted  . 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 Postmenopausal 01/22/2017  . Bipolar 1 disorder, depressed ([place]) 12/29/2016  . Family history of suicide 11/20/2016  . Depression 11/20/2016  . Family history of breast cancer 11/20/2016  . Adhesive capsulitis of left shoulder 11/19/2016  . Greater trochanteric bursitis of right hip 11/19/2016    Darrel Hoover  PT 06/05/2017, 6:09 PM  [facility] [address] [place], [place], [address] Phone: [phone]   Fax:  [phone]  Name: Katie Sherman MRN: [id number] Date of Birth: March 19, 1966

## 2017-06-06 ENCOUNTER — Encounter: Payer: Self-pay | Admitting: Physical Therapy

## 2017-06-06 ENCOUNTER — Ambulatory Visit: Payer: Self-pay | Admitting: Physical Therapy

## 2017-06-06 DIAGNOSIS — M25612 Stiffness of left shoulder, not elsewhere classified: Secondary | ICD-10-CM

## 2017-06-06 DIAGNOSIS — M25551 Pain in right hip: Secondary | ICD-10-CM

## 2017-06-06 DIAGNOSIS — G8929 Other chronic pain: Secondary | ICD-10-CM

## 2017-06-06 DIAGNOSIS — M25512 Pain in left shoulder: Secondary | ICD-10-CM

## 2017-06-06 DIAGNOSIS — M6281 Muscle weakness (generalized): Secondary | ICD-10-CM

## 2017-06-06 MED FILL — ?DICLOFENAC SOD DR 75 MG TA: 75 | 30 days supply | Qty: 60 | Fill #0

## 2017-06-06 MED FILL — GABAPENTIN 100 MG CAPSULE: 100 | 30 days supply | Qty: 90 | Fill #0

## 2017-06-07 ENCOUNTER — Encounter: Payer: Self-pay | Admitting: Physical Therapy

## 2017-06-07 NOTE — Therapy (Signed)
[place], [place], [address] Phone: [phone]   Fax:  [phone]  Physical Therapy Treatment  Patient Details  Name: Katie Sherman MRN: [id number] Date of Birth: 07-30-1965 Referring Provider: Lilia Argue DO   Encounter Date: 06/06/2017  PT End of Session - 06/07/17 1300    Visit Number  2    Number of Visits  16    Date for PT Re-Evaluation  07/27/17    Authorization Type  CAFA    PT Start Time  1630    PT Stop Time  1714    PT Time Calculation (min)  44 min    Activity Tolerance  Patient tolerated treatment well    Behavior During Therapy  [facility] for tasks assessed/performed       Past Medical History:  Diagnosis Date  . Cervical cancer ([place])   . Cervical cancer ([place])   . History of exercise stress test    ETT-Echo 5/18: normal EF, no ischemia  . History of loop recorder    currently on person  . Kidney stone     Past Surgical History:  Procedure Laterality Date  . CERVICAL CONE BIOPSY    . TUBAL LIGATION      There were no vitals filed for this visit.  Subjective Assessment - 06/07/17 1257    Subjective  Patient rpeorts no increase in soreness after her evaluation yesterday. Her Shoulder is feeling a little better. Her C/O at this timeis pain in her hip.     Limitations  Lifting;Walking    Currently in Pain?  Yes    Pain Score  3     Pain Location  Hip    Pain Orientation  Right    Pain Descriptors / Indicators  Aching    Pain Type  Chronic pain    Pain Onset  More than a month ago    Pain Frequency  Intermittent    Aggravating Factors   crossing legs     Pain Relieving Factors  voltarin                       [facility] Adult PT Treatment/Exercise - 06/07/17 0001      Exercises   Exercises  Knee/Hip      Knee/Hip Exercises: Stretches   Passive Hamstring Stretch Limitations  hamstring stretch 3x20 sec hold     Piriformis Stretch Limitations  2x20 sec hold      Other Knee/Hip Stretches  lower trunk rotation x10 each way       Knee/Hip Exercises: Supine   Bridges Limitations  2x10     Straight Leg Raises Limitations  2x10 right     Other Supine Knee/Hip Exercises  suopine clam shell red 2x10       Manual Therapy   Manual Therapy  Soft tissue mobilization    Soft tissue mobilization  IASTYM to gluteals and trochanter area; IT band roll out and eduction on slef roll out              PT Education - 06/07/17 1300    Education provided  Yes    Education Details  TPDN benefits and risks     Person(s) Educated  Patient    Methods  Explanation    Comprehension  Verbalized understanding;Returned demonstration;Verbal cues required;Tactile cues required       PT Short Term Goals - 06/07/17 1309      PT SHORT TERM GOAL #1  Title  She will be independent with initial HEP    Time  3    Period  Weeks    Status  On-going      PT SHORT TERM GOAL #2   Title  she will repor tpain in LTR hio decr 30% or more  with wlaking and standing    Time  4    Period  Weeks    Status  On-going      PT SHORT TERM GOAL #3   Title  She will be able to walk 3 blocks before incr hip pain    Time  4    Period  Weeks    Status  On-going      PT SHORT TERM GOAL #4   Title  She will be able to wortk with 30% decreased Lt shoulder pain    Time  4    Period  Weeks    Status  On-going      PT SHORT TERM GOAL #5   Title  She will reach overhead with 130 degree flexion     Time  4    Period  Weeks    Status  On-going        PT Long Term Goals - 06/05/17 1539      PT LONG TERM GOAL #1   Title  She will be independent with all hEp issued    Time  8    Period  Weeks    Status  New      PT LONG TERM GOAL #2   Title  She will have full LT shouleractive ROM with 1-2 max pain    Time  8    Period  Weeks    Status  New      PT LONG TERM GOAL #3   Title  She will be able to reach into cabinets with 1-2 max pain    Time  8    Period  Weeks     Status  New      PT LONG TERM GOAL #4   Title  She will be able to walk community with 2-3 max pain RT hip    Time  8    Period  Weeks    Status  New      PT LONG TERM GOAL #5   Title  Foto score for hip will improve from 54% limited with hip to 40% limited or less    Time  8    Period  Weeks    Status  New            Plan - 06/07/17 1301    Clinical Impression Statement  Patient tolerated trigger point dry needling well. Patient did not have good twitch responses in the piriformis and upper hamstrings but she had great twitch responses in the vastus lateralis. She wasgiven stretching and strenghtening exercixes to reduce post needle soreness.     Clinical Presentation  Unstable    Clinical Decision Making  Moderate    Rehab Potential  Good    Clinical Impairments Affecting Rehab Potential  chronicity of problems    PT Frequency  2x / week    PT Duration  8 weeks    PT Treatment/Interventions  Cryotherapy;Electrical Stimulation;Iontophoresis 4mg /ml Dexamethasone;Moist Heat;Ultrasound;Therapeutic activities;Therapeutic exercise;Manual techniques;Dry needling;Taping;Patient/family education    PT Next Visit Plan  Start exer for home LT shoulder isometrics, LT hip isometrics.  stretchingSTW to shoulder and hip, modalites    Consulted and  Agree with Plan of Care  Patient       Patient will benefit from skilled therapeutic intervention in order to improve the following deficits and impairments:  Difficulty walking, Decreased activity tolerance, Decreased range of motion, Increased muscle spasms, Postural dysfunction, Pain  Visit Diagnosis: Pain in right hip  Chronic left shoulder pain  Stiffness of left shoulder, not elsewhere classified  Muscle weakness (generalized)     Problem List Patient Active Problem List   Diagnosis Date Noted  . 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 Postmenopausal 01/22/2017  . Bipolar 1 disorder, depressed ([place]) 12/29/2016  .  Family history of suicide 11/20/2016  . Depression 11/20/2016  . Family history of breast cancer 11/20/2016  . Adhesive capsulitis of left shoulder 11/19/2016  . Greater trochanteric bursitis of right hip 11/19/2016    Carney Living PT DPT  06/07/2017, 1:19 PM  [facility] [address] [place], [place], [address] Phone: [phone]   Fax:  [phone]  Name: Katie Sherman MRN: [id number] Date of Birth: Jan 30, 1966

## 2017-06-12 ENCOUNTER — Ambulatory Visit: Payer: Self-pay | Attending: Family Medicine

## 2017-06-12 DIAGNOSIS — M25612 Stiffness of left shoulder, not elsewhere classified: Secondary | ICD-10-CM | POA: Insufficient documentation

## 2017-06-12 DIAGNOSIS — M6281 Muscle weakness (generalized): Secondary | ICD-10-CM | POA: Insufficient documentation

## 2017-06-12 DIAGNOSIS — M25512 Pain in left shoulder: Secondary | ICD-10-CM | POA: Insufficient documentation

## 2017-06-12 DIAGNOSIS — G8929 Other chronic pain: Secondary | ICD-10-CM | POA: Insufficient documentation

## 2017-06-12 DIAGNOSIS — M25551 Pain in right hip: Secondary | ICD-10-CM | POA: Insufficient documentation

## 2017-06-12 NOTE — Patient Instructions (Addendum)
Strengthening: Isometric Flexion  Using wall for resistance, press right fist into ball using light pressure. Hold ____ seconds. Repeat ____ times per set. Do ____ sets per session. Do ____ sessions per day.  SHOULDER: Abduction (Isometric)  Use wall as resistance. Press arm against pillow. Keep elbow straight. Hold ___ seconds. ___ reps per set, ___ sets per day, ___ days per week  Extension (Isometric)  Place left bent elbow and back of arm against wall. Press elbow against wall. Hold ____ seconds. Repeat ____ times. Do ____ sessions per day.  Internal Rotation (Isometric)  Place palm of right fist against door frame, with elbow bent. Press fist against door frame. Hold ____ seconds. Repeat ____ times. Do ____ sessions per day.  External Rotation (Isometric)  Place back of left fist against door frame, with elbow bent. Press fist against door frame. Hold ____ seconds. Repeat ____ times. Do ____ sessions per day.  Copyright  VHI. All rights reserved.   All 5-10 reps with 5-10 sec hold 1-2x/day                                             ISSUED SCISSORS/SHOULDER BRIDGE/ SIDELYE CLAM  10 REPS DAILY

## 2017-06-12 NOTE — Therapy (Signed)
[place], [place], [address] Phone: [phone]   Fax:  [phone]  Physical Therapy Treatment  Patient Details  Name: Katie Sherman MRN: [id number] Date of Birth: 06/09/1966 Referring Provider: Lilia Argue DO   Encounter Date: 06/12/2017  PT End of Session - 06/12/17 1051    Visit Number  3    Number of Visits  16    Date for PT Re-Evaluation  07/27/17    Authorization Type  CAFA    PT Start Time  2542    PT Stop Time  1148    PT Time Calculation (min)  56 min    Activity Tolerance  Patient tolerated treatment well;No increased pain    Behavior During Therapy  WFL for tasks assessed/performed       Past Medical History:  Diagnosis Date  . Cervical cancer ([place])   . Cervical cancer ([place])   . History of exercise stress test    ETT-Echo 5/18: normal EF, no ischemia  . History of loop recorder    currently on person  . Kidney stone     Past Surgical History:  Procedure Laterality Date  . CERVICAL CONE BIOPSY    . TUBAL LIGATION      There were no vitals filed for this visit.  Subjective Assessment - 06/12/17 1055    Subjective  She reports not sure if needling helped .   Yesterday pain incr with shooting pains.  Did stretching and band exercises    Currently in Pain?  Yes    Pain Score  4     Pain Location  Hip    Pain Orientation  Right    Pain Descriptors / Indicators  Aching    Pain Type  Chronic pain    Pain Onset  More than a month ago    Pain Frequency  Intermittent    Aggravating Factors   cross leg    Pain Score  0 at rest    Pain Type  Chronic pain    Pain Onset  More than a month ago    Pain Frequency  Intermittent    Aggravating Factors   moving arm                      [facility] Adult PT Treatment/Exercise - 06/12/17 0001      Knee/Hip Exercises: Stretches   Passive Hamstring Stretch Limitations  --    Piriformis Stretch Limitations  2x20 sec hold      Other Knee/Hip Stretches  lower trunk rotation x10 each way with light overpressure going LT       Knee/Hip Exercises: Supine   Straight Leg Raises Limitations  2x10 right     Other Supine Knee/Hip Exercises  bent knee raise x 8 RT/LT      Knee/Hip Exercises: Sidelying   Clams  x12 and reverse clam x 12      Shoulder Exercises: Isometric Strengthening   Flexion  5X5"    Extension  5X5"    External Rotation  5X5"    Internal Rotation  5X5"    ABduction  5X5"      Modalities   Modalities  Moist Heat      Moist Heat Therapy   Number Minutes Moist Heat  12 Minutes    Moist Heat Location  Hip RT      Manual Therapy   Manual Therapy  Passive ROM;Manual Traction    Passive ROM  LT shoulder all planes all painful end range.     Manual Traction  RT leg pull x 75 reps GR 4             PT Education - 06/12/17 1108    Education provided  Yes    Education Details  isometrics, clam, scissors, shoulder bridge    Person(s) Educated  Patient    Methods  Explanation;Demonstration;Handout;Verbal cues;Tactile cues    Comprehension  Verbalized understanding;Returned demonstration       PT Short Term Goals - 06/12/17 1147      PT SHORT TERM GOAL #1   Title  She will be independent with initial HEP    Status  On-going      PT SHORT TERM GOAL #2   Title  she will repor t pain in RT hip decr 30% or more  with walking and standing      PT SHORT TERM GOAL #3   Title  She will be able to walk 3 blocks before incr hip pain    Status  On-going      PT SHORT TERM GOAL #4   Title  She will be able to wortk with 30% decreased Lt shoulder pain    Status  On-going      PT SHORT TERM GOAL #5   Title  She will reach overhead with 130 degree flexion     Status  Unable to assess        PT Long Term Goals - 06/05/17 1539      PT LONG TERM GOAL #1   Title  She will be independent with all hEp issued    Time  8    Period  Weeks    Status  New      PT LONG TERM GOAL #2   Title  She  will have full LT shouleractive ROM with 1-2 max pain    Time  8    Period  Weeks    Status  New      PT LONG TERM GOAL #3   Title  She will be able to reach into cabinets with 1-2 max pain    Time  8    Period  Weeks    Status  New      PT LONG TERM GOAL #4   Title  She will be able to walk community with 2-3 max pain RT hip    Time  8    Period  Weeks    Status  New      PT LONG TERM GOAL #5   Title  Foto score for hip will improve from 54% limited with hip to 40% limited or less    Time  8    Period  Weeks    Status  New            Plan - 06/12/17 1051    Clinical Impression Statement  No improvement overall so far.  tolerated session but tender RT ITB and lateral hamstring and shoulder ROM  Able to lye on Lt shoulder for 15-20 min RT thigh more sore from workout    PT Treatment/Interventions  Cryotherapy;Electrical Stimulation;Iontophoresis 4mg /ml Dexamethasone;Moist Heat;Ultrasound;Therapeutic activities;Therapeutic exercise;Manual techniques;Dry needling;Taping;Patient/family education    Consulted and Agree with Plan of Care  Patient       Patient will benefit from skilled therapeutic intervention in order to improve the following deficits and impairments:  Difficulty walking, Decreased activity tolerance, Decreased range of motion, Increased muscle  spasms, Postural dysfunction, Pain  Visit Diagnosis: Chronic left shoulder pain  Stiffness of left shoulder, not elsewhere classified  Muscle weakness (generalized)  Pain in right hip     Problem List Patient Active Problem List   Diagnosis Date Noted  . 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 Postmenopausal 01/22/2017  . Bipolar 1 disorder, depressed ([place]) 12/29/2016  . Family history of suicide 11/20/2016  . Depression 11/20/2016  . Family history of breast cancer 11/20/2016  . Adhesive capsulitis of left shoulder 11/19/2016  . Greater trochanteric bursitis of right hip 11/19/2016     Darrel Hoover  PT 06/12/2017, 11:53 AM  [facility] [address] [place], [place], [address] Phone: [phone]   Fax:  [phone]  Name: Arvada Seaborn MRN: [id number] Date of Birth: 25-Nov-1965

## 2017-06-19 ENCOUNTER — Ambulatory Visit (HOSPITAL_COMMUNITY): Payer: Self-pay | Admitting: Psychiatry

## 2017-06-20 ENCOUNTER — Encounter: Payer: Self-pay | Admitting: Physical Therapy

## 2017-06-20 ENCOUNTER — Ambulatory Visit: Payer: Self-pay | Admitting: Physical Therapy

## 2017-06-20 DIAGNOSIS — M25612 Stiffness of left shoulder, not elsewhere classified: Secondary | ICD-10-CM

## 2017-06-20 DIAGNOSIS — G8929 Other chronic pain: Secondary | ICD-10-CM

## 2017-06-20 DIAGNOSIS — M25551 Pain in right hip: Secondary | ICD-10-CM

## 2017-06-20 DIAGNOSIS — M25512 Pain in left shoulder: Principal | ICD-10-CM

## 2017-06-20 DIAGNOSIS — M6281 Muscle weakness (generalized): Secondary | ICD-10-CM

## 2017-06-21 NOTE — Therapy (Signed)
[place], [place], [address] Phone: [phone]   Fax:  [phone]  Physical Therapy Treatment  Patient Details  Name: Katie Sherman MRN: [id number] Date of Birth: 1966-01-03 Referring Provider: Lilia Argue DO   Encounter Date: 06/20/2017  PT End of Session - 06/21/17 1019    Visit Number  4    Number of Visits  16    Date for PT Re-Evaluation  07/27/17    Authorization Type  CAFA    PT Start Time  7096    PT Stop Time  2836    PT Time Calculation (min)  42 min    Activity Tolerance  Patient tolerated treatment well;No increased pain    Behavior During Therapy  WFL for tasks assessed/performed       Past Medical History:  Diagnosis Date  . Cervical cancer ([place])   . Cervical cancer ([place])   . History of exercise stress test    ETT-Echo 5/18: normal EF, no ischemia  . History of loop recorder    currently on person  . Kidney stone     Past Surgical History:  Procedure Laterality Date  . CERVICAL CONE BIOPSY    . TUBAL LIGATION      There were no vitals filed for this visit.  Subjective Assessment - 06/20/17 1635    Subjective  Patient had some shooting pain after the needling but those had gone away and she feels like it is a little better. She has been up on her feet all day and is not having much pain   (Pended)     Limitations  Lifting;Walking  (Pended)     How long can you sit comfortably?  30 min  (Pended)     How long can you stand comfortably?  60 min  (Pended)     How long can you walk comfortably?  2 blocks  (Pended)     Diagnostic tests  xrays LT shoulder  (Pended)     Patient Stated Goals  Releive pain improve movement.   (Pended)     Currently in Pain?  Yes  (Pended)     Pain Score  6   (Pended)     Pain Orientation  Right  (Pended)     Pain Descriptors / Indicators  Aching  (Pended)     Pain Type  Chronic pain  (Pended)     Pain Onset  More than a month ago  (Pended)      Pain Frequency  Intermittent  (Pended)     Aggravating Factors   cross leg   (Pended)                       [facility] Adult PT Treatment/Exercise - 06/21/17 0001      Knee/Hip Exercises: Stretches   Piriformis Stretch Limitations  2x20 sec hold     Other Knee/Hip Stretches  lower trunk rotation x10 each way with light overpressure going LT       Knee/Hip Exercises: Sidelying   Clams  x12 and reverse clam x 12      Modalities   Modalities  Iontophoresis      Iontophoresis   Type of Iontophoresis  Dexamethasone    Location  left trochanter     Dose  1cc     Time  slow release       Manual Therapy   Manual Therapy  Soft tissue mobilization  Soft tissue mobilization  IASTYM to gluteals and trochanter area; IT band roll out IASTYM to IT band        Trigger Point Dry Needling - 06/21/17 1032    Consent Given?  Yes    Education Handout Provided  Yes    Muscles Treated Lower Body  Quadriceps    Quadriceps Response  Twitch response elicited           PT Education - 06/21/17 1018    Education provided  Yes    Education Details  reviewed benefits and risks of TPDN    Person(s) Educated  Patient    Methods  Demonstration;Explanation;Tactile cues;Verbal cues    Comprehension  Verbalized understanding;Returned demonstration       PT Short Term Goals - 06/21/17 1036      PT SHORT TERM GOAL #1   Title  She will be independent with initial HEP    Time  3    Period  Weeks    Status  On-going      PT SHORT TERM GOAL #2   Title  she will repor t pain in RT hip decr 30% or more  with walking and standing    Time  4    Period  Weeks    Status  On-going      PT SHORT TERM GOAL #3   Title  She will be able to walk 3 blocks before incr hip pain    Time  4    Period  Weeks    Status  On-going      PT SHORT TERM GOAL #4   Title  She will be able to wortk with 30% decreased Lt shoulder pain    Time  4    Period  Weeks    Status  On-going        PT  Long Term Goals - 06/05/17 1539      PT LONG TERM GOAL #1   Title  She will be independent with all hEp issued    Time  8    Period  Weeks    Status  New      PT LONG TERM GOAL #2   Title  She will have full LT shouleractive ROM with 1-2 max pain    Time  8    Period  Weeks    Status  New      PT LONG TERM GOAL #3   Title  She will be able to reach into cabinets with 1-2 max pain    Time  8    Period  Weeks    Status  New      PT LONG TERM GOAL #4   Title  She will be able to walk community with 2-3 max pain RT hip    Time  8    Period  Weeks    Status  New      PT LONG TERM GOAL #5   Title  Foto score for hip will improve from 54% limited with hip to 40% limited or less    Time  8    Period  Weeks    Status  New            Plan - 06/21/17 1021    Clinical Impression Statement  Patient had soreness with palpation of the trohcanter. Therapy decided to use ionto to work on that inflammation. She tolerated dry needling well. she had a good twitch response for all needles.  She was encouraged to work on her exercises at home.     Clinical Presentation  Unstable    Clinical Decision Making  Moderate    Rehab Potential  Good    Clinical Impairments Affecting Rehab Potential  chronicity of problems    PT Frequency  2x / week    PT Duration  8 weeks    PT Treatment/Interventions  Cryotherapy;Electrical Stimulation;Iontophoresis 4mg /ml Dexamethasone;Moist Heat;Ultrasound;Therapeutic activities;Therapeutic exercise;Manual techniques;Dry needling;Taping;Patient/family education    PT Next Visit Plan  Start exer for home LT shoulder isometrics, LT hip isometrics.  stretchingSTW to shoulder and hip, modalites    Consulted and Agree with Plan of Care  Patient       Patient will benefit from skilled therapeutic intervention in order to improve the following deficits and impairments:  Difficulty walking, Decreased activity tolerance, Decreased range of motion, Increased muscle  spasms, Postural dysfunction, Pain  Visit Diagnosis: Chronic left shoulder pain  Stiffness of left shoulder, not elsewhere classified  Muscle weakness (generalized)  Pain in right hip     Problem List Patient Active Problem List   Diagnosis Date Noted  . 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 Postmenopausal 01/22/2017  . Bipolar 1 disorder, depressed ([place]) 12/29/2016  . Family history of suicide 11/20/2016  . Depression 11/20/2016  . Family history of breast cancer 11/20/2016  . Adhesive capsulitis of left shoulder 11/19/2016  . Greater trochanteric bursitis of right hip 11/19/2016    Carney Living PT DPT  06/21/2017, 10:40 AM  [facility] [address] [place], [place], [address] Phone: [phone]   Fax:  [phone]  Name: Katie Sherman MRN: [id number] Date of Birth: Apr 21, 1966

## 2017-06-25 ENCOUNTER — Telehealth: Payer: Self-pay | Admitting: Family Medicine

## 2017-06-25 NOTE — Telephone Encounter (Signed)
Patient called asking for a refill on Trazodnone, Flexeril, and the ranitidine. Please fu with patient. She also wants to be taken off of lamictal and latuda, please fu with patient.

## 2017-06-27 ENCOUNTER — Ambulatory Visit: Payer: Self-pay | Admitting: Physical Therapy

## 2017-06-27 ENCOUNTER — Telehealth: Payer: Self-pay | Admitting: Physical Therapy

## 2017-06-27 NOTE — Telephone Encounter (Signed)
Voice message was left about the missed visit today on phone number [phone].  I informed her of the date and time of the next appointment and asked  her to please call to cancel if she cannot attend or if she no longer requires PT.  I told her that when patients miss appointments we ask them to refer to the attendance policy.  I told her we were looking forward to working with her.  Melvenia Needles PTA

## 2017-06-28 ENCOUNTER — Other Ambulatory Visit: Payer: Self-pay | Admitting: Family Medicine

## 2017-06-28 ENCOUNTER — Telehealth: Payer: Self-pay | Admitting: Family Medicine

## 2017-06-28 DIAGNOSIS — M545 Low back pain, unspecified: Secondary | ICD-10-CM

## 2017-06-28 DIAGNOSIS — F5105 Insomnia due to other mental disorder: Secondary | ICD-10-CM

## 2017-06-28 DIAGNOSIS — F99 Mental disorder, not otherwise specified: Secondary | ICD-10-CM

## 2017-06-28 DIAGNOSIS — K219 Gastro-esophageal reflux disease without esophagitis: Secondary | ICD-10-CM

## 2017-06-28 MED ORDER — TRAZODONE HCL 50 MG PO TABS: 150.0000 mg | ORAL_TABLET | Freq: Every evening | ORAL | 0 refills | Status: DC | PRN

## 2017-06-28 MED ORDER — RANITIDINE HCL 150 MG PO TABS: 150.0000 mg | ORAL_TABLET | Freq: Two times a day (BID) | ORAL | 6 refills | Status: DC | PRN

## 2017-06-28 MED ORDER — CYCLOBENZAPRINE HCL 10 MG PO TABS: ORAL_TABLET | ORAL | 0 refills | Status: DC

## 2017-06-28 NOTE — Telephone Encounter (Signed)
Refills sent to [facility] pharmacy.

## 2017-06-28 NOTE — Telephone Encounter (Signed)
Pt called to receive an update on her medication refill for -traZODone (DESYREL) 50 MG tablet  -cyclobenzaprine (FLEXERIL) 10 MG tablet  -ranitidine (ZANTAC) 150 MG tablet  Please follow up

## 2017-07-04 ENCOUNTER — Ambulatory Visit: Payer: Self-pay | Admitting: Physical Therapy

## 2017-07-11 ENCOUNTER — Ambulatory Visit: Payer: Self-pay | Attending: Family Medicine

## 2017-07-11 DIAGNOSIS — M25612 Stiffness of left shoulder, not elsewhere classified: Secondary | ICD-10-CM | POA: Insufficient documentation

## 2017-07-11 DIAGNOSIS — M25551 Pain in right hip: Secondary | ICD-10-CM | POA: Insufficient documentation

## 2017-07-11 DIAGNOSIS — M25512 Pain in left shoulder: Secondary | ICD-10-CM | POA: Insufficient documentation

## 2017-07-11 DIAGNOSIS — G8929 Other chronic pain: Secondary | ICD-10-CM | POA: Insufficient documentation

## 2017-07-11 DIAGNOSIS — M6281 Muscle weakness (generalized): Secondary | ICD-10-CM | POA: Insufficient documentation

## 2017-07-11 NOTE — Therapy (Addendum)
[place], [place], [address] Phone: [phone]   Fax:  [phone]  Physical Therapy Treatment/ Discharge   Patient Details  Name: Katie Sherman MRN: [id number] Date of Birth: 09/08/1965 Referring Provider: Lilia Argue DO   Encounter Date: 07/11/2017  PT End of Session - 07/11/17 1543    Visit Number  5    Number of Visits  16    Date for PT Re-Evaluation  08/10/17    Authorization Type  CAFA    PT Start Time  0345    PT Stop Time  4431    PT Time Calculation (min)  42 min    Activity Tolerance  Patient tolerated treatment well;No increased pain    Behavior During Therapy  WFL for tasks assessed/performed       Past Medical History:  Diagnosis Date  . Cervical cancer ([place])   . Cervical cancer ([place])   . History of exercise stress test    ETT-Echo 5/18: normal EF, no ischemia  . History of loop recorder    currently on person  . Kidney stone     Past Surgical History:  Procedure Laterality Date  . CERVICAL CONE BIOPSY    . TUBAL LIGATION      There were no vitals filed for this visit.  Subjective Assessment - 07/11/17 1555    Subjective  Hip not better . shooting pains have returned.  Lt shoulder improved alot.  Frozen shoulder better.        Currently in Pain?  Yes    Pain Score  9     Pain Location  Hip    Pain Orientation  Right    Pain Descriptors / Indicators  Aching;Shooting    Pain Type  Chronic pain    Pain Onset  More than a month ago    Pain Frequency  Constant    Aggravating Factors   cross leg    Pain Relieving Factors  voltarine    Pain Score  0    Pain Location  Shoulder         [facility] PT Assessment - 07/11/17 0001      Assessment   Medical Diagnosis  Lt shoulder adhesive capsulitis, Rt hip bursitis      AROM   Left Shoulder Flexion  140 Degrees    Left Shoulder ABduction  130 Degrees    Left Shoulder Internal Rotation  60 Degrees    Left Shoulder External  Rotation  67 Degrees    Left Shoulder Horizontal ABduction  3 Degrees    Left Shoulder Horizontal ADduction  90 Degrees      Strength   Overall Strength Comments  UE strength WNL both shoulders                  [facility] Adult PT Treatment/Exercise - 07/11/17 0001      Knee/Hip Exercises: Stretches   Piriformis Stretch Limitations  2x20 sec hold       Iontophoresis   Type of Iontophoresis  Dexamethasone    Location  righttrochanter     Dose  1cc     Time  slow release       Manual Therapy   Soft tissue mobilization  IASTYM to gluteals and trochanter area; IT band roll out IASTYM to IT band     Passive ROM  LT shoulder all planes all painful end range. but more tolerable    Manual Traction  RT leg pull  x 75 reps GR 2-3             PT Education - 07/11/17 1640    Education provided  Yes    Education Details  reviewed wear time and removal of ionto patch with irritation of skin or 4-6 hours if no irritation    Person(s) Educated  Patient    Methods  Explanation    Comprehension  Verbalized understanding       PT Short Term Goals - 07/11/17 1637      PT SHORT TERM GOAL #1   Title  She will be independent with initial HEP    Status  On-going      PT SHORT TERM GOAL #2   Title  she will repor t pain in RT hip decr 30% or more  with walking and standing    Baseline  no improvement    Status  On-going      PT SHORT TERM GOAL #3   Title  She will be able to walk 3 blocks before incr hip pain    Status  On-going      PT SHORT TERM GOAL #4   Title  She will be able to wortk with 30% decreased Lt shoulder pain    Baseline  pain significantly reduced    Status  Achieved      PT SHORT TERM GOAL #5   Title  She will reach overhead with 130 degree flexion     Status  Achieved        PT Long Term Goals - 07/11/17 1637      PT LONG TERM GOAL #1   Title  She will be independent with all hEp issued    Status  On-going      PT LONG TERM GOAL #2   Title   She will have full LT shouleractive ROM with 1-2 max pain    Status  On-going      PT LONG TERM GOAL #3   Title  She will be able to reach into cabinets with 1-2 max pain    Status  On-going      PT LONG TERM GOAL #4   Title  She will be able to walk community with 2-3 max pain RT hip    Status  On-going      PT LONG TERM GOAL #5   Title  Foto score for hip will improve from 54% limited with hip to 40% limited or less    Status  Unable to assess            Plan - 07/11/17 1544    Clinical Impression Statement  Shoulder improving . Hip is not. Still very tender RT trochantor and soft tissues of hip and ITband.  She relates this to being on feet for long periods at work.  Due to limtiations with off days at work treatment has not been consistent and this may affect progress. She reports ionto patch was somewhat helpful.       Clinical Impairments Affecting Rehab Potential  chronicity of problems    PT Frequency  2x / week    PT Duration  3 weeks after MD visit next week    PT Treatment/Interventions  Cryotherapy;Electrical Stimulation;Iontophoresis 23m/ml Dexamethasone;Moist Heat;Ultrasound;Therapeutic activities;Therapeutic exercise;Manual techniques;Dry needling;Taping;Patient/family education    PT Next Visit Plan  Start exer for home LT shoulder isometrics, LT hip isometrics.  stretching STW to shoulder and hip, modalites    PT Home Exercise Plan  encouraged active ROM into pain /tightness to facilitate gains in LT shoulder ROM.     Consulted and Agree with Plan of Care  Patient       Patient will benefit from skilled therapeutic intervention in order to improve the following deficits and impairments:  Difficulty walking, Decreased activity tolerance, Decreased range of motion, Increased muscle spasms, Postural dysfunction, Pain  Visit Diagnosis: Chronic left shoulder pain  Stiffness of left shoulder, not elsewhere classified  Muscle weakness (generalized)  Pain in right  hip    PHYSICAL THERAPY DISCHARGE SUMMARY  Visits from Start of Care: 5  Current functional level related to goals / functional outcomes: Unknown patient did not return for follow up    Remaining deficits: Unknown    Education / Equipment: Unknown   Plan: Patient agrees to discharge.  Patient goals were not met. Patient is being discharged due to meeting the stated rehab goals.  ?????      Problem List Patient Active Problem List   Diagnosis Date Noted  . 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 Postmenopausal 01/22/2017  . Bipolar 1 disorder, depressed ([place]) 12/29/2016  . Family history of suicide 11/20/2016  . Depression 11/20/2016  . Family history of breast cancer 11/20/2016  . Adhesive capsulitis of left shoulder 11/19/2016  . Greater trochanteric bursitis of right hip 11/19/2016    Carolyne Littles PT DPT  08/30/2017  Darrel Hoover  PT 07/11/2017, 4:45 PM  [facility] [address] [place], [place], [address] Phone: [phone]   Fax:  [phone]  Name: Katie Sherman MRN: [id number] Date of Birth: [birth year]

## 2017-07-13 ENCOUNTER — Other Ambulatory Visit: Payer: Self-pay | Admitting: Family Medicine

## 2017-07-13 ENCOUNTER — Encounter: Payer: Self-pay | Admitting: Family Medicine

## 2017-07-13 ENCOUNTER — Ambulatory Visit: Payer: Self-pay | Attending: Family Medicine | Admitting: Family Medicine

## 2017-07-13 VITALS — BP 104/70 | HR 77 | Temp 98.3°F | Resp 18 | Ht 66.0 in | Wt 183.0 lb

## 2017-07-13 DIAGNOSIS — Z760 Encounter for issue of repeat prescription: Secondary | ICD-10-CM | POA: Insufficient documentation

## 2017-07-13 DIAGNOSIS — M25551 Pain in right hip: Secondary | ICD-10-CM | POA: Insufficient documentation

## 2017-07-13 DIAGNOSIS — F319 Bipolar disorder, unspecified: Secondary | ICD-10-CM

## 2017-07-13 DIAGNOSIS — F99 Mental disorder, not otherwise specified: Secondary | ICD-10-CM

## 2017-07-13 DIAGNOSIS — G8929 Other chronic pain: Secondary | ICD-10-CM | POA: Insufficient documentation

## 2017-07-13 DIAGNOSIS — Z79899 Other long term (current) drug therapy: Secondary | ICD-10-CM | POA: Insufficient documentation

## 2017-07-13 DIAGNOSIS — E782 Mixed hyperlipidemia: Secondary | ICD-10-CM | POA: Insufficient documentation

## 2017-07-13 DIAGNOSIS — M25512 Pain in left shoulder: Secondary | ICD-10-CM | POA: Insufficient documentation

## 2017-07-13 DIAGNOSIS — K219 Gastro-esophageal reflux disease without esophagitis: Secondary | ICD-10-CM | POA: Insufficient documentation

## 2017-07-13 DIAGNOSIS — R42 Dizziness and giddiness: Secondary | ICD-10-CM | POA: Insufficient documentation

## 2017-07-13 DIAGNOSIS — F5105 Insomnia due to other mental disorder: Secondary | ICD-10-CM | POA: Insufficient documentation

## 2017-07-13 DIAGNOSIS — F3130 Bipolar disorder, current episode depressed, mild or moderate severity, unspecified: Secondary | ICD-10-CM | POA: Insufficient documentation

## 2017-07-13 MED ORDER — OMEPRAZOLE 20 MG PO CPDR: 20.0000 mg | DELAYED_RELEASE_CAPSULE | Freq: Every day | ORAL | 3 refills | Status: DC

## 2017-07-13 MED ORDER — LAMOTRIGINE 200 MG PO TABS: ORAL_TABLET | ORAL | 1 refills | Status: DC

## 2017-07-13 MED ORDER — OMEGA-3-ACID ETHYL ESTERS 1 G PO CAPS: 1.0000 g | ORAL_CAPSULE | Freq: Two times a day (BID) | ORAL | 2 refills | Status: DC

## 2017-07-13 MED ORDER — MECLIZINE HCL 25 MG PO TABS: 25.0000 mg | ORAL_TABLET | Freq: Three times a day (TID) | ORAL | 0 refills | Status: DC | PRN

## 2017-07-13 MED ORDER — PRAVASTATIN SODIUM 40 MG PO TABS: 40.0000 mg | ORAL_TABLET | Freq: Every day | ORAL | 6 refills | Status: DC

## 2017-07-13 MED ORDER — DICLOFENAC SODIUM 75 MG PO TBEC: 75.0000 mg | DELAYED_RELEASE_TABLET | Freq: Two times a day (BID) | ORAL | 0 refills | Status: DC

## 2017-07-13 MED ORDER — TRAZODONE HCL 50 MG PO TABS: 150.0000 mg | ORAL_TABLET | Freq: Every evening | ORAL | 0 refills | Status: DC | PRN

## 2017-07-13 MED ORDER — GABAPENTIN 100 MG PO CAPS: ORAL_CAPSULE | ORAL | 1 refills | Status: DC

## 2017-07-13 MED FILL — GABAPENTIN 100 MG CAPSULE: 100 | 30 days supply | Qty: 90 | Fill #0

## 2017-07-13 MED FILL — ?OMEPRAZOLE DR 20MG CAPSULE: 20 | 30 days supply | Qty: 30 | Fill #0

## 2017-07-13 MED FILL — CYCLOBENZAPRINE 10 MG TAB: 10 | 10 days supply | Qty: 30 | Fill #0

## 2017-07-13 MED FILL — traZODone HCL 50 MG TABS: 50 | 30 days supply | Qty: 90 | Fill #0

## 2017-07-13 MED FILL — ?MECLIZINE 25 MG TABLET: 25 | 10 days supply | Qty: 30 | Fill #0

## 2017-07-13 MED FILL — PRAVASTATIN NA 40 MG TAB: 40 | 30 days supply | Qty: 30 | Fill #0

## 2017-07-13 MED FILL — OMEGA-3 ETHYL ESTERS 1 GM C: 1 | 30 days supply | Qty: 60 | Fill #0

## 2017-07-13 MED FILL — lamoTRIgine 200 MG TABS: 200 | 30 days supply | Qty: 30 | Fill #0

## 2017-07-13 MED FILL — ?DICLOFENAC SOD DR 75 MG TA: 75 | 30 days supply | Qty: 60 | Fill #1

## 2017-07-13 NOTE — Patient Instructions (Addendum)
Heart-Healthy Eating Plan Heart-healthy meal planning includes:  Limiting unhealthy fats.  Increasing healthy fats.  Making other small dietary changes.  You may need to talk with your doctor or a diet specialist (dietitian) to create an eating plan that is right for you. What types of fat should I choose?  Choose healthy fats. These include olive oil and canola oil, flaxseeds, walnuts, almonds, and seeds.  Eat more omega-3 fats. These include salmon, mackerel, sardines, tuna, flaxseed oil, and ground flaxseeds. Try to eat fish at least twice each week.  Limit saturated fats. ? Saturated fats are often found in animal products, such as meats, butter, and cream. ? Plant sources of saturated fats include palm oil, palm kernel oil, and coconut oil.  Avoid foods with partially hydrogenated oils in them. These include stick margarine, some tub margarines, cookies, crackers, and other baked goods. These contain trans fats. What general guidelines do I need to follow?  Check food labels carefully. Identify foods with trans fats or high amounts of saturated fat.  Fill one half of your plate with vegetables and green salads. Eat 4-5 servings of vegetables per day. A serving of vegetables is: ? 1 cup of raw leafy vegetables. ?  cup of raw or cooked cut-up vegetables. ?  cup of vegetable juice.  Fill one fourth of your plate with whole grains. Look for the word "whole" as the first word in the ingredient list.  Fill one fourth of your plate with lean protein foods.  Eat 4-5 servings of fruit per day. A serving of fruit is: ? One medium whole fruit. ?  cup of dried fruit. ?  cup of fresh, frozen, or canned fruit. ?  cup of 100% fruit juice.  Eat more foods that contain soluble fiber. These include apples, broccoli, carrots, beans, peas, and barley. Try to get 20-30 g of fiber per day.  Eat more home-cooked food. Eat less restaurant, buffet, and fast food.  Limit or avoid  alcohol.  Limit foods high in starch and sugar.  Avoid fried foods.  Avoid frying your food. Try baking, boiling, grilling, or broiling it instead. You can also reduce fat by: ? Removing the skin from poultry. ? Removing all visible fats from meats. ? Skimming the fat off of stews, soups, and gravies before serving them. ? Steaming vegetables in water or broth.  Lose weight if you are overweight.  Eat 4-5 servings of nuts, legumes, and seeds per week: ? One serving of dried beans or legumes equals  cup after being cooked. ? One serving of nuts equals 1 ounces. ? One serving of seeds equals  ounce or one tablespoon.  You may need to keep track of how much salt or sodium you eat. This is especially true if you have high blood pressure. Talk with your doctor or dietitian to get more information. What foods can I eat? Grains Breads, including French, white, pita, wheat, raisin, rye, oatmeal, and Italian. Tortillas that are neither fried nor made with lard or trans fat. Low-fat rolls, including hotdog and hamburger buns and English muffins. Biscuits. Muffins. Waffles. Pancakes. Light popcorn. Whole-grain cereals. Flatbread. Melba toast. Pretzels. Breadsticks. Rusks. Low-fat snacks. Low-fat crackers, including oyster, saltine, matzo, graham, animal, and rye. Rice and pasta, including brown rice and pastas that are made with whole wheat. Vegetables All vegetables. Fruits All fruits, but limit coconut. Meats and Other Protein Sources Lean, well-trimmed beef, veal, pork, and lamb. Chicken and turkey without skin. All fish and shellfish.   Wild duck, rabbit, pheasant, and venison. Egg whites or low-cholesterol egg substitutes. Dried beans, peas, lentils, and tofu. Seeds and most nuts. Dairy Low-fat or nonfat cheeses, including ricotta, string, and mozzarella. Skim or 1% milk that is liquid, powdered, or evaporated. Buttermilk that is made with low-fat milk. Nonfat or low-fat  yogurt. Beverages Mineral water. Diet carbonated beverages. Sweets and Desserts Sherbets and fruit ices. Honey, jam, marmalade, jelly, and syrups. Meringues and gelatins. Pure sugar candy, such as hard candy, jelly beans, gumdrops, mints, marshmallows, and small amounts of dark chocolate. [employer] Eat all sweets and desserts in moderation. Fats and Oils Nonhydrogenated (trans-free) margarines. Vegetable oils, including soybean, sesame, sunflower, olive, peanut, safflower, corn, canola, and cottonseed. Salad dressings or mayonnaise made with a vegetable oil. Limit added fats and oils that you use for cooking, baking, salads, and as spreads. Other Cocoa powder. Coffee and tea. All seasonings and condiments. The items listed above may not be a complete list of recommended foods or beverages. Contact your dietitian for more options. What foods are not recommended? Grains Breads that are made with saturated or trans fats, oils, or whole milk. Croissants. Butter rolls. Cheese breads. Sweet rolls. Donuts. Buttered popcorn. Chow mein noodles. High-fat crackers, such as cheese or butter crackers. Meats and Other Protein Sources Fatty meats, such as hotdogs, short ribs, sausage, spareribs, bacon, rib eye roast or steak, and mutton. High-fat deli meats, such as salami and bologna. Caviar. Domestic duck and goose. Organ meats, such as kidney, liver, sweetbreads, and heart. Dairy Cream, sour cream, cream cheese, and creamed cottage cheese. Whole-milk cheeses, including blue (bleu), Monte[place] Jack, [place], [place], American, [place], Swiss, cheddar, [place], and [place]. Whole or 2% milk that is liquid, evaporated, or condensed. Whole buttermilk. Cream sauce or high-fat cheese sauce. Yogurt that is made from whole milk. Beverages Regular sodas and juice drinks with added sugar. Sweets and Desserts Frosting. Pudding. Cookies. Cakes other than angel food cake. Candy that has milk chocolate or white  chocolate, hydrogenated fat, butter, coconut, or unknown ingredients. Buttered syrups. Full-fat ice cream or ice cream drinks. Fats and Oils Gravy that has suet, meat fat, or shortening. Cocoa butter, hydrogenated oils, palm oil, coconut oil, palm kernel oil. These can often be found in baked products, candy, fried foods, nondairy creamers, and whipped toppings. Solid fats and shortenings, including bacon fat, salt pork, lard, and butter. Nondairy cream substitutes, such as coffee creamers and sour cream substitutes. Salad dressings that are made of unknown oils, cheese, or sour cream. The items listed above may not be a complete list of foods and beverages to avoid. Contact your dietitian for more information. This information is not intended to replace advice given to you by your health care provider. Make sure you discuss any questions you have with your health care provider. Document Released: 12/26/2011 Document Revised: 12/02/2015 Document Reviewed: 12/18/2013 Elsevier Interactive Patient Education  2018 [employer]. Dizziness Dizziness is a common problem. It makes you feel unsteady or light-headed. You may feel like you are about to pass out (faint). Dizziness can lead to getting hurt if you stumble or fall. Dizziness can be caused by many things, including:  Medicines.  Not having enough water in your body (dehydration).  Illness.  Follow these instructions at home: Eating and drinking  Drink enough fluid to keep your pee (urine) clear or pale yellow. This helps to keep you from getting dehydrated. Try to drink more clear fluids, such as water.  Do not drink alcohol.  Limit how much caffeine you drink or eat, if your doctor tells you to do that.  Limit how much salt (sodium) you drink or eat, if your doctor tells you to do that. Activity  Avoid making quick movements. ? When you stand up from sitting in a chair, steady yourself until you feel okay. ? In the morning, first sit  up on the side of the bed. When you feel okay, stand slowly while you hold onto something. Do this until you know that your balance is fine.  If you need to stand in one place for a long time, move your legs often. Tighten and relax the muscles in your legs while you are standing.  Do not drive or use heavy machinery if you feel dizzy.  Avoid bending down if you feel dizzy. Place items in your home so you can reach them easily without leaning over. Lifestyle  Do not use any products that contain nicotine or tobacco, such as cigarettes and e-cigarettes. If you need help quitting, ask your doctor.  Try to lower your stress level. You can do this by using methods such as yoga or meditation. Talk with your doctor if you need help. General instructions  Watch your dizziness for any changes.  Take over-the-counter and prescription medicines only as told by your doctor. Talk with your doctor if you think that you are dizzy because of a medicine that you are taking.  Tell a friend or a family member that you are feeling dizzy. If he or she notices any changes in your behavior, have this person call your doctor.  Keep all follow-up visits as told by your doctor. This is important. Contact a doctor if:  Your dizziness does not go away.  Your dizziness or light-headedness gets worse.  You feel sick to your stomach (nauseous).  You have trouble hearing.  You have new symptoms.  You are unsteady on your feet.  You feel like the room is spinning. Get help right away if:  You throw up (vomit) or have watery poop (diarrhea), and you cannot eat or drink anything.  You have trouble: ? Talking. ? Walking. ? Swallowing. ? Using your arms, hands, or legs.  You feel generally weak.  You are not thinking clearly, or you have trouble forming sentences. A friend or family member may notice this.  You have: ? Chest pain. ? Pain in your belly (abdomen). ? Shortness of  breath. ? Sweating.  Your vision changes.  You are bleeding.  You have a very bad headache.  You have neck pain or a stiff neck.  You have a fever. These symptoms may be an emergency. Do not wait to see if the symptoms will go away. Get medical help right away. Call your local emergency services (911 in the [place] Do not drive yourself to the hospital. Summary  Dizziness makes you feel unsteady or light-headed. You may feel like you are about to pass out (faint).  Drink enough fluid to keep your pee (urine) clear or pale yellow. Do not drink alcohol.  Avoid making quick movements if you feel dizzy.  Watch your dizziness for any changes. This information is not intended to replace advice given to you by your health care provider. Make sure you discuss any questions you have with your health care provider. Document Released: 06/15/2011 Document Revised: 07/13/2016 Document Reviewed: 07/13/2016 Elsevier Interactive Patient Education  2017 [employer].

## 2017-07-13 NOTE — Progress Notes (Signed)
Subjective:  Patient ID: Katie Sherman, female    DOB: 1965-11-02  Age: 52 y.o. MRN: [id number]  CC: Hyperlipidemia   HPI Katie Sherman presents for follow up.  History bipolar.  She complains of mood swings. Onset was approximately several years ago, gradually worsening since that time.  She denies current suicidal and homicidal plan or intent.   Family history significant for depression and suicide. Possible organic causes contributing are: none. Risk factors: positive family history in  brother(s) and ex husband, negative life event including the suicides of family and close friends. She reports upcoming appointment with mental health provider this month. She is adherent with lamotrigine use but reports minimal relief of symptoms on current dosage. She expresses wanting to try [place]. She c/o dizziness. Onset 2 week ago. Symptoms occurring regardless of position change. She denies any tinnitus, palpitations, or SOB. History of GERD she reports taking Zantac with minimal relief of symptoms. She reports decreasing her soda intake. She reports drinking 2 cups of coffee every morning and eating a regular diet. History of chronic shoulder pain. She reports symptoms having improved with PT and NSAID use. She request medication refill.   Outpatient Medications Prior to Visit  Medication Sig Dispense Refill  . cyclobenzaprine (FLEXERIL) 10 MG tablet TAKE 1 TABLET BY MOUTH 3 TIMES DAILY AS NEEDED FOR MUSCLE SPASMS. 30 tablet 0  . diclofenac (VOLTAREN) 75 MG EC tablet Take 1 tablet (75 mg total) by mouth 2 (two) times daily. 60 tablet 1  . gabapentin (NEURONTIN) 100 MG capsule TAKE 1 CAPSULE BY MOUTH 3 TIMES DAILY FOR AGITATION 90 capsule 1  . ibuprofen (ADVIL,MOTRIN) 800 MG tablet Take 1 tablet (800 mg total) by mouth every 8 (eight) hours as needed for moderate pain (Take with food). 30 tablet 0  . omega-3 acid ethyl esters (LOVAZA) 1 g capsule Take 1 capsule (1 g total) by mouth 2 (two) times daily.  For high cholesterol 60 capsule 2  . pravastatin (PRAVACHOL) 40 MG tablet Take 1 tablet (40 mg total) by mouth daily at 6 PM. For high cholesterol 30 tablet 6  . traZODone (DESYREL) 50 MG tablet Take 3 tablets (150 mg total) by mouth at bedtime as needed for sleep. 90 tablet 0  . cephALEXin (KEFLEX) 500 MG capsule Take 3 times daily for 7 days. (Patient not taking: Reported on 03/27/2017) 21 capsule 0  . HYDROcodone-acetaminophen (NORCO/VICODIN) 5-325 MG tablet Take 1-2 tablets by mouth every 6 (six) hours as needed for severe pain. (Patient not taking: Reported on 03/27/2017) 10 tablet 0  . lamoTRIgine (LAMICTAL) 100 MG tablet TAKE ONE TABLET BY MOUTH ONCE DAILY. . (Patient not taking: Reported on 07/13/2017) 30 tablet 1  . LORazepam (ATIVAN) 0.5 MG tablet Take 1 tablet (0.5 mg total) by mouth every 6 (six) hours as needed for anxiety. (Patient not taking: Reported on 03/27/2017) 10 tablet 0  . nicotine (NICODERM CQ - DOSED IN MG/24 HOURS) 14 mg/24hr patch Place 1 patch (14 mg total) onto the skin daily. For smoking cessation (Patient not taking: Reported on 03/27/2017) 21 patch 1  . potassium chloride (K-DUR) 10 MEQ tablet Take 1 tablet (10 mEq total) by mouth daily. (Patient not taking: Reported on 06/05/2017) 21 tablet 0  . ranitidine (ZANTAC) 150 MG tablet Take 1 tablet (150 mg total) by mouth 2 (two) times daily as needed for heartburn. (Patient not taking: Reported on 07/13/2017) 30 tablet 6  . traMADol (ULTRAM) 50 MG tablet Take 1 tablet (50 mg  total) by mouth every 8 (eight) hours as needed. (Patient not taking: Reported on 06/05/2017) 30 tablet 0   No facility-administered medications prior to visit.     ROS Review of Systems  Constitutional: Negative.   Eyes: Negative.   Respiratory: Negative.   Cardiovascular: Negative.   Gastrointestinal: Negative.   Musculoskeletal: Positive for arthralgias.  Skin: Negative.   Psychiatric/Behavioral: Positive for dysphoric mood (history of bipolar  disorder). Negative for suicidal ideas.    Objective:  BP 104/70 (BP Location: Left Arm, Patient Position: Sitting, Cuff Size: Normal)   Pulse 77   Temp 98.3 F (36.8 C) (Oral)   Resp 18   Ht 5\' 6"  (1.676 m)   Wt 183 lb (83 kg)   LMP 04/29/2012   SpO2 99%   BMI 29.54 kg/m   BP/Weight 07/18/2017 07/13/2017 25/11/3974  Systolic BP 734 193 790  Diastolic BP 72 70 80  Wt. (Lbs) 183 183 178  BMI 29.09 29.54 28.3  Some encounter information is confidential and restricted. Go to Review Flowsheets activity to see all data.     Physical Exam  Constitutional: She appears well-developed and well-nourished.  Cardiovascular: Normal rate, regular rhythm, normal heart sounds and intact distal pulses.  Pulmonary/Chest: Effort normal and breath sounds normal.  Abdominal: Soft. Bowel sounds are normal. There is no tenderness.  Musculoskeletal:       Left shoulder: She exhibits pain.       Right hip: She exhibits normal range of motion.  Right hip pain.  Skin: Skin is warm and dry. No erythema.  Psychiatric: Her mood appears anxious. She expresses no homicidal and no suicidal ideation. She expresses no suicidal plans and no homicidal plans. She is communicative. She is attentive.  Nursing note and vitals reviewed.   Assessment & Plan:   1. Mixed hyperlipidemia  - Lipid Panel - omega-3 acid ethyl esters (LOVAZA) 1 g capsule; Take 1 capsule (1 g total) by mouth 2 (two) times daily. For high cholesterol  Dispense: 60 capsule; Refill: 2  2. Bipolar 1 disorder, depressed (HCC) Increase Lamictal dosage. Follow up with mental health provider. - lamoTRIgine (LAMICTAL) 200 MG tablet; TAKE ONE TABLET BY MOUTH ONCE DAILY. Marland Kitchen  Dispense: 30 tablet; Refill: 1 - gabapentin (NEURONTIN) 100 MG capsule; TAKE 1 CAPSULE BY MOUTH 3 TIMES DAILY FOR AGITATION  Dispense: 90 capsule; Refill: 1  3. Insomnia due to other mental disorder  - traZODone (DESYREL) 50 MG tablet; Take 3 tablets (150 mg total) by mouth  at bedtime as needed for sleep.  Dispense: 90 tablet; Refill: 0  4. Chronic left shoulder pain  - diclofenac (VOLTAREN) 75 MG EC tablet; Take 1 tablet (75 mg total) by mouth 2 (two) times daily.  Dispense: 60 tablet; Refill: 0  5. Chronic pain of right hip  - diclofenac (VOLTAREN) 75 MG EC tablet; Take 1 tablet (75 mg total) by mouth 2 (two) times daily.  Dispense: 60 tablet; Refill: 0  6. Medication refill  - gabapentin (NEURONTIN) 100 MG capsule; TAKE 1 CAPSULE BY MOUTH 3 TIMES DAILY FOR AGITATION  Dispense: 90 capsule; Refill: 1  7. Dizziness Orthostatic VS taken. - meclizine (ANTIVERT) 25 MG tablet; Take 1 tablet (25 mg total) by mouth 3 (three) times daily as needed for dizziness.  Dispense: 30 tablet; Refill: 0  8. Gastroesophageal reflux disease without esophagitis Discussed dietary interventions - omeprazole (PRILOSEC) 20 MG capsule; Take 1 capsule (20 mg total) by mouth daily.  Dispense: 30 capsule; Refill: 3  Follow-up: Return in about 8 weeks (around 09/07/2017), or if symptoms worsen or fail to improve, for Depression.   Alfonse Spruce FNP

## 2017-07-14 LAB — LIPID PANEL
CHOL/HDL RATIO: 3.6 ratio (ref 0.0–4.4)
Cholesterol, Total: 197 mg/dL (ref 100–199)
HDL: 55 mg/dL (ref >39)
LDL CALC: 115 mg/dL — AB (ref 0–99)
Triglycerides: 134 mg/dL (ref 0–149)
VLDL Cholesterol Cal: 27 mg/dL (ref 5–40)

## 2017-07-18 ENCOUNTER — Ambulatory Visit (INDEPENDENT_AMBULATORY_CARE_PROVIDER_SITE_OTHER): Payer: Self-pay | Admitting: Family Medicine

## 2017-07-18 ENCOUNTER — Other Ambulatory Visit: Payer: Self-pay | Admitting: Family Medicine

## 2017-07-18 ENCOUNTER — Encounter: Payer: Self-pay | Admitting: Family Medicine

## 2017-07-18 ENCOUNTER — Ambulatory Visit
Admission: RE | Admit: 2017-07-18 | Discharge: 2017-07-18 | Disposition: A | Discharge status: Home / Self Care | Payer: No Typology Code available for payment source | Source: Ambulatory Visit | Attending: Family Medicine | Admitting: Family Medicine

## 2017-07-18 VITALS — BP 108/72 | Ht 66.5 in | Wt 183.0 lb

## 2017-07-18 DIAGNOSIS — E782 Mixed hyperlipidemia: Secondary | ICD-10-CM

## 2017-07-18 DIAGNOSIS — M7502 Adhesive capsulitis of left shoulder: Secondary | ICD-10-CM

## 2017-07-18 DIAGNOSIS — M7061 Trochanteric bursitis, right hip: Secondary | ICD-10-CM

## 2017-07-18 MED ORDER — METHYLPREDNISOLONE ACETATE 40 MG/ML IJ SUSP
40.0000 mg | Freq: Once | INTRAMUSCULAR | Status: AC
  Administered 2017-07-18: 40 mg via INTRA_ARTICULAR

## 2017-07-18 MED ORDER — PRAVASTATIN SODIUM 80 MG PO TABS: 80.0000 mg | ORAL_TABLET | Freq: Every day | ORAL | 5 refills | Status: DC

## 2017-07-18 NOTE — Progress Notes (Signed)
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 She reports that she has been working with physical therapy for both of these are she reports that her left shoulder is improving, her range of motion is improving. She reports to having some pain and limitations with shoulder extension and internal range of motion. She reports now she is able reach behind her back to hook on her bra strap. She does not report of any pain in her left shoulder today. Unfortunately, she cannot say she has had the same success with her right hip. She reports it still continues severe pain in her right lateral hip. She has been working intermittently was physical therapy. Last session with physical therapy was a week ago back on 07/11/17. In her no, then mention that she was still very tender in the right greater trochanter and in the soft tissues of the hip and IT band. They have been working with her with strengthening of her pelvic, hip, quadriceps, hamstrings. Additionally they have been doing dry needling and iontophoresis. She reports dry Has not been helping but she has noticed some benefit with iontophoresis. She reports that the diclofenac 75 mg twice daily has been helping her. She did run out of this medication over the Christmas holiday and the pain returned. She reports that she does see her primary provider a few days ago she was given a refill. Upon clarification, she reports that she was given diclofenac, not ibuprofen 800 mg. She is concerned however regarding long-term side effects of chronic NSAID  use and asked today if she can have another cortisone shot into her right greater trochanteric bursa. She does not report of any numbness, tingling, or burning paresthesias. No interval injury or trauma.  Review of systems:  As stated above  Interval past medical history, surgical history, family history, and social history obtained and unchanged.  Physical exam: Vital signs are reviewed and are documented in the chart Gen.: Alert, oriented, appears stated age, in no apparent distress HEENT: Moist oral mucosa Respiratory: Normal respirations, able to speak in full sentences Cardiac: Regular rate, distal pulses 2+ Integumentary: No rashes on visible skin:  Neurologic: She does have continued slight weakness with hip abductor, hip flexion, and hamstring strength, would categorize this as 4+/5, quad strength normal on the right side she does have full rotator cuff strength on the left shoulder  Psych: Normal affect, mood is described as good Musculoskeletal: Inspection of left shoulder reveals no obvious deformity or muscle atrophy, no warmth, erythema, ecchymosis, or effusion, no tenderness to palpation anywhere in her left shoulder today, she has much improved range of motion of her shoulder today, she is able to get to 160 of forward flexion, as well as 160 of abduction, she still has pain with external range of motion, she is able to get to about 50 of external range of motion when she starts to have pain, her internal range of motion is to 60, rotator cuff impingement testing negative; section of right hip reveals no obvious deformity or muscle atrophy, no warmth, erythema, ecchymosis, or effusion, she is done told her to point  palpation over the right greater trochanteric bursa, no tenderness to palpation over the right anterior hip or posterior hip, she still has tenderness over the tensor fasciae latae and proximal IT band, as noted above she does continue to have slight weakness with hip  abduction, hip flexion, and hamstring strength  Procedure: After obtaining written informed consent, discussing risk of bleeding, infection, and steroid flare, with benefit of pain relief, patient agrees to proceed with cortisone injection to the right greater trochanter bursa. Overlying skin was cleaned with Betadine and alcohol wipes, ethyl chloride was used as anesthetic spray. Using a 3-1 mixture of lidocaine and Depo-Medrol using spinal gauge needle, the right greater trochanteric bursa was injected with a needle pointing more anteromedially. Afterwards, Band-Aid was applied. No post procedure complications noted. She was observed in the office for 5-10 minutes to observe for any neurologic symptoms, which she did not have. 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  Assessment and plan: 1. Right greater trochanter pain syndrome 2. Shoulder adhesive capsulitis, symptoms improving  Plan: In regards to left shoulder, I do feel that she is progressing quite well and I don't forsee and immediate setbacks. Discussed importance of continued range of motion and physical therapy exercises. Did discuss with her that I am okay with doing another cortisone injection into her right greater trochanter bursa today, however, did discuss that this will be the last injection that I would do for the next 6 months at least, due to excessive cortisone use and tissue related injury secondary to that. She is understanding of this. Injection was done as noted above without any complications noted. Discussed with her since she has not had any x-rays of her hip will obtain one today just to make sure there are no obvious structural abnormalities. I did discuss with her that it is very important continue with formal physical therapy. Unfortunately, given her job situation she is not able to go as consistent as what would be ideal. She reports that she does have 2 future appointment set up, discussed that she does  have weakness with hip abduction, hip flexion, and hamstring and this will help out with her pain symptoms overall. I discussed with her to continue with the diclofenac as needed for pain. I discussed continuing with iontophoresis at physical therapy. I will call her after x-ray results today to let her know results, but would like for her to return in 6 weeks for follow-up. If no interval improvement at that time, next step would be consideration of MRI of her right hip.    Mort Sawyers, M.D. [place]

## 2017-07-20 ENCOUNTER — Telehealth: Payer: Self-pay | Admitting: Family Medicine

## 2017-07-20 NOTE — Telephone Encounter (Signed)
Spoke with Katie Sherman today at [facility] today to discuss her x-ray results of her right hip that were done on 07/18/17. Did personally and independently reviewed her x-ray today. Fortunately, no evidence of any type of osteoarthritic changes were seen. She has normal joint space in her hip. Discussed with her that with this, this rules out any type of arthritic changes that would make [place] go down a different direction and management. Discussed continue with physical therapy. Her follow-up in a few weeks for previously scheduled visit or sooner as needed.  Mort Sawyers, MD Primary Care Sports Medicine Fellow [facility]

## 2017-07-24 ENCOUNTER — Ambulatory Visit: Payer: Self-pay

## 2017-08-01 ENCOUNTER — Ambulatory Visit: Payer: Self-pay | Admitting: Family Medicine

## 2017-08-01 ENCOUNTER — Ambulatory Visit: Payer: No Typology Code available for payment source | Admitting: Physical Therapy

## 2017-08-07 ENCOUNTER — Ambulatory Visit (INDEPENDENT_AMBULATORY_CARE_PROVIDER_SITE_OTHER): Payer: No Typology Code available for payment source | Admitting: Psychiatry

## 2017-08-07 ENCOUNTER — Telehealth: Payer: Self-pay | Admitting: Family Medicine

## 2017-08-07 ENCOUNTER — Encounter (HOSPITAL_COMMUNITY): Payer: Self-pay | Admitting: Psychiatry

## 2017-08-07 VITALS — BP 128/74 | HR 66 | Ht 66.5 in | Wt 186.0 lb

## 2017-08-07 DIAGNOSIS — Z818 Family history of other mental and behavioral disorders: Secondary | ICD-10-CM

## 2017-08-07 DIAGNOSIS — F1921 Other psychoactive substance dependence, in remission: Secondary | ICD-10-CM

## 2017-08-07 DIAGNOSIS — Z91411 Personal history of adult psychological abuse: Secondary | ICD-10-CM

## 2017-08-07 DIAGNOSIS — Z760 Encounter for issue of repeat prescription: Secondary | ICD-10-CM

## 2017-08-07 DIAGNOSIS — Z62810 Personal history of physical and sexual abuse in childhood: Secondary | ICD-10-CM

## 2017-08-07 DIAGNOSIS — Z560 Unemployment, unspecified: Secondary | ICD-10-CM

## 2017-08-07 DIAGNOSIS — F5105 Insomnia due to other mental disorder: Secondary | ICD-10-CM

## 2017-08-07 DIAGNOSIS — F17210 Nicotine dependence, cigarettes, uncomplicated: Secondary | ICD-10-CM

## 2017-08-07 DIAGNOSIS — F99 Mental disorder, not otherwise specified: Secondary | ICD-10-CM

## 2017-08-07 DIAGNOSIS — Z915 Personal history of self-harm: Secondary | ICD-10-CM

## 2017-08-07 DIAGNOSIS — F319 Bipolar disorder, unspecified: Secondary | ICD-10-CM

## 2017-08-07 DIAGNOSIS — Z91410 Personal history of adult physical and sexual abuse: Secondary | ICD-10-CM

## 2017-08-07 DIAGNOSIS — F4010 Social phobia, unspecified: Secondary | ICD-10-CM

## 2017-08-07 DIAGNOSIS — F419 Anxiety disorder, unspecified: Secondary | ICD-10-CM

## 2017-08-07 DIAGNOSIS — Z62811 Personal history of psychological abuse in childhood: Secondary | ICD-10-CM

## 2017-08-07 DIAGNOSIS — F4310 Post-traumatic stress disorder, unspecified: Secondary | ICD-10-CM

## 2017-08-07 MED ORDER — GABAPENTIN 100 MG PO CAPS: 200.0000 mg | ORAL_CAPSULE | Freq: Three times a day (TID) | ORAL | 0 refills | Status: DC

## 2017-08-07 MED ORDER — LAMOTRIGINE 150 MG PO TABS: 300.0000 mg | ORAL_TABLET | Freq: Every day | ORAL | 0 refills | Status: DC

## 2017-08-07 MED ORDER — TRAZODONE HCL 50 MG PO TABS: 150.0000 mg | ORAL_TABLET | Freq: Every evening | ORAL | 0 refills | Status: DC | PRN

## 2017-08-07 MED FILL — GABAPENTIN 100 MG CAPSULE: 100 | 30 days supply | Qty: 180 | Fill #0

## 2017-08-07 MED FILL — PRAVASTATIN SODIUM 80 MG TA: 80 | 30 days supply | Qty: 30 | Fill #0

## 2017-08-07 MED FILL — lamoTRIgine 150 MG TABS: 150 | 30 days supply | Qty: 60 | Fill #0

## 2017-08-07 MED FILL — traZODone HCL 50 MG TABS: 50 | 30 days supply | Qty: 90 | Fill #0

## 2017-08-07 NOTE — Telephone Encounter (Signed)
Flexeril cannot be increased. If she is experiencing symptoms rec.she schedule follow up.

## 2017-08-07 NOTE — Telephone Encounter (Signed)
Patient wants a refill on flexeril and wants you to up it to 20 mg. Please fu with patient as soon as possible.
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
## 2017-08-09 NOTE — Telephone Encounter (Signed)
Pt aware of message. States she was already informed of message and appointment.

## 2017-08-10 ENCOUNTER — Telehealth (HOSPITAL_COMMUNITY): Payer: Self-pay

## 2017-08-10 NOTE — Telephone Encounter (Signed)
Patient called and reported a rash with blisters on arms, chest and back. I advised patient to stop the Lamictal immediately and if the rash does not start going away within 24 hours to go to urgent care. Spoke to Dr. Adele Schilder and he agreed with the plan. Patient is aware.

## 2017-08-13 MED FILL — OMEGA-3 ETHYL ESTERS 1 GM C: 1 | 30 days supply | Qty: 60 | Fill #1

## 2017-08-13 MED FILL — ?OMEPRAZOLE 20 MG CPDR: 20 | 30 days supply | Qty: 30 | Fill #1

## 2017-08-14 ENCOUNTER — Encounter (HOSPITAL_COMMUNITY): Payer: Self-pay | Admitting: Psychiatry

## 2017-08-14 ENCOUNTER — Other Ambulatory Visit (HOSPITAL_COMMUNITY): Payer: Self-pay

## 2017-08-14 ENCOUNTER — Ambulatory Visit: Payer: Self-pay | Admitting: Family Medicine

## 2017-08-14 ENCOUNTER — Ambulatory Visit (INDEPENDENT_AMBULATORY_CARE_PROVIDER_SITE_OTHER): Payer: No Typology Code available for payment source | Admitting: Psychiatry

## 2017-08-14 VITALS — BP 128/74 | HR 88 | Ht 66.5 in | Wt 187.0 lb

## 2017-08-14 DIAGNOSIS — Z915 Personal history of self-harm: Secondary | ICD-10-CM

## 2017-08-14 DIAGNOSIS — F1921 Other psychoactive substance dependence, in remission: Secondary | ICD-10-CM

## 2017-08-14 DIAGNOSIS — F319 Bipolar disorder, unspecified: Secondary | ICD-10-CM

## 2017-08-14 DIAGNOSIS — Z560 Unemployment, unspecified: Secondary | ICD-10-CM

## 2017-08-14 DIAGNOSIS — Z62810 Personal history of physical and sexual abuse in childhood: Secondary | ICD-10-CM

## 2017-08-14 DIAGNOSIS — F419 Anxiety disorder, unspecified: Secondary | ICD-10-CM

## 2017-08-14 DIAGNOSIS — Z62811 Personal history of psychological abuse in childhood: Secondary | ICD-10-CM

## 2017-08-14 DIAGNOSIS — F4310 Post-traumatic stress disorder, unspecified: Secondary | ICD-10-CM

## 2017-08-14 DIAGNOSIS — F17210 Nicotine dependence, cigarettes, uncomplicated: Secondary | ICD-10-CM

## 2017-08-14 MED ORDER — LURASIDONE HCL 40 MG PO TABS: 40.0000 mg | ORAL_TABLET | Freq: Every day | ORAL | 0 refills | Status: DC
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
## 2017-08-20 ENCOUNTER — Telehealth (HOSPITAL_COMMUNITY): Payer: Self-pay

## 2017-08-20 NOTE — Telephone Encounter (Signed)
Patient called and reports that the rash she got from Lamictal has returned. She is going to see her PCP and has been taking benadryl. Please advise, thank you

## 2017-08-21 ENCOUNTER — Other Ambulatory Visit (HOSPITAL_COMMUNITY): Payer: Self-pay | Admitting: Psychiatry

## 2017-08-21 NOTE — Telephone Encounter (Signed)
She was told to stop the Lamictal.  Is she still taking it?  She should see primary care physician or go to the local urgent care if rash do not resolve.

## 2017-08-21 NOTE — Telephone Encounter (Signed)
Patient is not taking the Lamictal, the rash came back on its own. Patient was going to see her PCP yesterday.

## 2017-08-21 NOTE — Telephone Encounter (Signed)
She needs to be seen by primary care physician or urgent care.

## 2017-08-22 ENCOUNTER — Other Ambulatory Visit: Payer: Self-pay | Admitting: Internal Medicine

## 2017-08-22 ENCOUNTER — Ambulatory Visit: Payer: No Typology Code available for payment source | Attending: Family Medicine | Admitting: Physician Assistant

## 2017-08-22 VITALS — BP 111/75 | HR 70 | Temp 97.4°F | Resp 16 | Wt 188.2 lb

## 2017-08-22 DIAGNOSIS — L42 Pityriasis rosea: Secondary | ICD-10-CM | POA: Insufficient documentation

## 2017-08-22 DIAGNOSIS — F319 Bipolar disorder, unspecified: Secondary | ICD-10-CM

## 2017-08-22 DIAGNOSIS — L03116 Cellulitis of left lower limb: Secondary | ICD-10-CM

## 2017-08-22 MED ORDER — CETIRIZINE HCL 10 MG PO TABS: 10.0000 mg | ORAL_TABLET | Freq: Every day | ORAL | 11 refills | Status: DC

## 2017-08-22 MED ORDER — HYDROCORTISONE 1 % EX LOTN: 1.0000 "application " | TOPICAL_LOTION | Freq: Two times a day (BID) | CUTANEOUS | 0 refills | Status: DC

## 2017-08-22 MED ORDER — MUPIROCIN 2 % EX OINT
TOPICAL_OINTMENT | CUTANEOUS | 0 refills | Status: DC
Start: 2017-08-22 — End: 2017-11-14

## 2017-08-22 MED ORDER — LURASIDONE HCL 40 MG PO TABS: 40.0000 mg | ORAL_TABLET | Freq: Every day | ORAL | 0 refills | Status: DC

## 2017-08-22 MED ORDER — HYDROXYZINE HCL 25 MG PO TABS: 25.0000 mg | ORAL_TABLET | Freq: Three times a day (TID) | ORAL | 0 refills | Status: DC | PRN

## 2017-08-22 MED FILL — hydrOXYzine HCL 25 MG TABS: 25 | 10 days supply | Qty: 30 | Fill #0

## 2017-08-22 MED FILL — ?CETIRIZINE HCL 10 MG TABLE: 10 | 30 days supply | Qty: 30 | Fill #0

## 2017-08-22 MED FILL — LATUDA 40 MG TABLET: 40 | 30 days supply | Qty: 30 | Fill #0

## 2017-08-22 MED FILL — MUPIROCIN 2% OINTMENT: 2 | 7 days supply | Qty: 22 | Fill #0

## 2017-08-22 NOTE — Patient Instructions (Signed)
Pityriasis Rosea Pityriasis rosea is a rash that usually appears on the trunk of the body. It may also appear on the upper arms and upper legs. It usually begins as a single patch, and then more patches begin to develop. The rash may cause mild itching, but it normally does not cause other problems. It usually goes away without treatment. However, it may take weeks or months for the rash to go away completely. What are the causes? The cause of this condition is not known. The condition does not spread from person to person (is noncontagious). What increases the risk? This condition is more likely to develop in young adults and children. It is most common in the spring and fall. What are the signs or symptoms? The main symptom of this condition is a rash.  The rash usually begins with a single oval patch that is larger than the ones that follow. This is called a herald patch. It generally appears a week or more before the rest of the rash appears.  When more patches start to develop, they spread quickly on the trunk, back, and arms. These patches are smaller than the first one.  The patches that make up the rash are usually oval-shaped and pink or red in color. They are usually flat, but they may sometimes be raised so that they can be felt with a finger. They may also be finely crinkled and have a scaly ring around the edge.  The rash does not typically appear on areas of the skin that are exposed to the sun.  Most people who have this condition do not have other symptoms, but some have mild itching. In a few cases, a mild headache or body aches may occur before the rash appears and then go away. How is this diagnosed? Your health care provider may diagnose this condition by doing a physical exam and taking your medical history. To rule out other possible causes for the rash, the health care provider may order blood tests or take a skin sample from the rash to be looked at under a microscope. How  is this treated? Usually, treatment is not needed for this condition. The rash will probably go away on its own in 4-8 weeks. In some cases, a health care provider may recommend or prescribe medicine to reduce itching. Follow these instructions at home:  Take medicines only as directed by your health care provider.  Avoid scratching the affected areas of skin.  Do not take hot baths or use a sauna. Use only warm water when bathing or showering. Heat can increase itching. Contact a health care provider if:  Your rash does not go away in 8 weeks.  Your rash gets much worse.  You have a fever.  You have swelling or pain in the rash area.  You have fluid, blood, or pus coming from the rash area. This information is not intended to replace advice given to you by your health care provider. Make sure you discuss any questions you have with your health care provider. Document Released: 08/02/2001 Document Revised: 12/02/2015 Document Reviewed: 06/03/2014 Elsevier Interactive Patient Education  2018 [employer] Inc[employer]  

## 2017-08-22 NOTE — Progress Notes (Signed)
Patient ID: Katie Sherman, female   DOB: 09/27/1965, 52 y.o.   MRN: [id number]     Shahira Fiske, is a 52 y.o. female  [id number]:[id number]  [id number]:[id number]  DOB - Dec 07, 1965  Subjective:  Chief Complaint and HPI: Katie Sherman is a 52 y.o. female here today for rash. She Was on Lamictal and it started causing a rash/hives about 2 weeks ago.  lamictal was stopped.  Vistaril was helping rash but she ran out.  Rash is persistent.  She describes to me as a rash starting on her L arm as one spot about 1 week after lamictal dose had been increased.  She had been on Lamictal for several years.  The rash then spread to B arms and on the R flank/back area with a few lesions.  She denies f/c.  She did feel more tired a couple of weeks ago and just generally didn't feel well.     ROS:   Constitutional:  No f/c, No night sweats, No unexplained weight loss. EENT:  No vision changes, No blurry vision, No hearing changes. No mouth, throat, or ear problems.  Respiratory: No cough, No SOB Cardiac: No CP, no palpitations GI:  No abd pain, No N/V/D. GU: No Urinary s/sx Musculoskeletal: No joint pain Neuro: No headache, no dizziness, no motor weakness.  Skin: + rash Endocrine:  No polydipsia. No polyuria.  Psych: Denies SI/HI  No problems updated.  ALLERGIES: Allergies  Allergen Reactions  . Lamictal [Lamotrigine] Rash    PAST MEDICAL HISTORY: Past Medical History:  Diagnosis Date  . Cervical cancer ([place])   . Cervical cancer ([place])   . History of exercise stress test    ETT-Echo 5/18: normal EF, no ischemia  . History of loop recorder    currently on person  . Kidney stone     MEDICATIONS AT HOME: Prior to Admission medications   Medication Sig Start Date End Date Taking? Authorizing Provider  cetirizine (ZYRTEC) 10 MG tablet Take 1 tablet (10 mg total) by mouth daily. Prn itching 08/22/17   Argentina Donovan, PA-C  cyclobenzaprine (FLEXERIL) 10 MG tablet TAKE 1 TABLET BY MOUTH 3 TIMES DAILY  AS NEEDED FOR MUSCLE SPASMS. 06/28/17   Alfonse Spruce, FNP  diclofenac (VOLTAREN) 75 MG EC tablet Take 1 tablet (75 mg total) by mouth 2 (two) times daily. 07/13/17   Alfonse Spruce, FNP  gabapentin (NEURONTIN) 100 MG capsule Take 2 capsules (200 mg total) by mouth 3 (three) times daily. 08/07/17   Arfeen, Arlyce Harman, MD  hydrocortisone 1 % lotion Apply 1 application topically 2 (two) times daily. 08/22/17   Argentina Donovan, PA-C  hydrOXYzine (ATARAX/VISTARIL) 25 MG tablet Take 1 tablet (25 mg total) by mouth 3 (three) times daily as needed. 08/22/17   Argentina Donovan, PA-C  lurasidone (LATUDA) 40 MG TABS tablet Take 1 tablet (40 mg total) by mouth daily with breakfast. 08/14/17   Arfeen, Arlyce Harman, MD  meclizine (ANTIVERT) 25 MG tablet Take 1 tablet (25 mg total) by mouth 3 (three) times daily as needed for dizziness. 07/13/17   Alfonse Spruce, FNP  mupirocin ointment (BACTROBAN) 2 % Apply bid to infected skin X 1 week 08/22/17   Argentina Donovan, PA-C  omega-3 acid ethyl esters (LOVAZA) 1 g capsule Take 1 capsule (1 g total) by mouth 2 (two) times daily. For high cholesterol 07/13/17   Alfonse Spruce, FNP  omeprazole (PRILOSEC) 20 MG capsule Take 1 capsule (20 mg total) by  mouth daily. 07/13/17   Alfonse Spruce, FNP  pravastatin (PRAVACHOL) 80 MG tablet Take 1 tablet (80 mg total) by mouth daily at 6 PM. For high cholesterol 07/18/17   Alfonse Spruce, FNP  traZODone (DESYREL) 50 MG tablet Take 3 tablets (150 mg total) by mouth at bedtime as needed for sleep. 08/07/17   Arfeen, Arlyce Harman, MD     Objective:  EXAM:   Vitals:   08/22/17 1353  BP: 111/75  Pulse: 70  Resp: 16  Temp: (!) 97.4 F (36.3 C)  TempSrc: Oral  SpO2: 99%  Weight: 188 lb 3.2 oz (85.4 kg)    General appearance : A&OX3. NAD. Non-toxic-appearing HEENT: Atraumatic and Normocephalic.  PERRLA. EOM intact.   Neck: supple, no JVD. No cervical lymphadenopathy. No thyromegaly Chest/Lungs:   Breathing-non-labored, Good air entry bilaterally, breath sounds normal without rales, rhonchi, or wheezing  Upper inner L thigh-1cm boil/abscess that is fluctuant w/o induration CVS: S1 S2 regular, no murmurs, gallops, rubs  Extremities: Bilateral Lower Ext shows no edema, both legs are warm to touch with = pulse throughout Neurology:  CN II-XII grossly intact, Non focal.   Psych:  TP linear. J/I WNL. Normal speech. Appropriate eye contact and affect.  Skin:  Oval patches on B arms.  No urticaria or dermatographia  Data Review Lab Results  Component Value Date   HGBA1C 5.6 04/13/2017   HGBA1C 5.3 12/30/2016     Assessment & Plan   1. Pityriasis rosea Do not think this is a drug rash- - hydrOXYzine (ATARAX/VISTARIL) 25 MG tablet; Take 1 tablet (25 mg total) by mouth 3 (three) times daily as needed.  Dispense: 30 tablet; Refill: 0 - hydrocortisone 1 % lotion; Apply 1 application topically 2 (two) times daily.  Dispense: 118 mL; Refill: 0 - cetirizine (ZYRTEC) 10 MG tablet; Take 1 tablet (10 mg total) by mouth daily. Prn itching  Dispense: 30 tablet; Refill: 11  2. Cellulitis of left lower extremity - mupirocin ointment (BACTROBAN) 2 %; Apply bid to infected skin X 1 week  Dispense: 22 g; Refill: 0   Patient have been counseled extensively about nutrition and exercise  Return in about 3 months (around 11/19/2017) for aasign new PCP.  The patient was given clear instructions to go to ER or return to medical center if symptoms don't improve, worsen or new problems develop. The patient verbalized understanding. The patient was told to call to get lab results if they haven't heard anything in the next week.     Freeman Caldron, PA-C [facility] and [place] [facility], [place]   08/22/2017, 2:44 PM

## 2017-08-29 ENCOUNTER — Encounter (HOSPITAL_COMMUNITY): Payer: Self-pay | Admitting: Psychiatry

## 2017-08-29 ENCOUNTER — Ambulatory Visit (INDEPENDENT_AMBULATORY_CARE_PROVIDER_SITE_OTHER): Payer: No Typology Code available for payment source | Admitting: Psychiatry

## 2017-08-29 ENCOUNTER — Ambulatory Visit: Payer: No Typology Code available for payment source | Attending: Internal Medicine | Admitting: Physician Assistant

## 2017-08-29 VITALS — BP 119/76 | HR 60 | Ht 66.0 in | Wt 189.6 lb

## 2017-08-29 VITALS — BP 116/74 | HR 63 | Temp 97.7°F | Resp 16 | Ht 66.0 in | Wt 190.6 lb

## 2017-08-29 DIAGNOSIS — F99 Mental disorder, not otherwise specified: Secondary | ICD-10-CM

## 2017-08-29 DIAGNOSIS — L0232 Furuncle of buttock: Secondary | ICD-10-CM | POA: Insufficient documentation

## 2017-08-29 DIAGNOSIS — F419 Anxiety disorder, unspecified: Secondary | ICD-10-CM

## 2017-08-29 DIAGNOSIS — L02425 Furuncle of right lower limb: Secondary | ICD-10-CM | POA: Insufficient documentation

## 2017-08-29 DIAGNOSIS — L0292 Furuncle, unspecified: Secondary | ICD-10-CM

## 2017-08-29 DIAGNOSIS — R450 Nervousness: Secondary | ICD-10-CM

## 2017-08-29 DIAGNOSIS — Z79899 Other long term (current) drug therapy: Secondary | ICD-10-CM | POA: Insufficient documentation

## 2017-08-29 DIAGNOSIS — Z915 Personal history of self-harm: Secondary | ICD-10-CM

## 2017-08-29 DIAGNOSIS — F4310 Post-traumatic stress disorder, unspecified: Secondary | ICD-10-CM

## 2017-08-29 DIAGNOSIS — Z8541 Personal history of malignant neoplasm of cervix uteri: Secondary | ICD-10-CM | POA: Insufficient documentation

## 2017-08-29 DIAGNOSIS — Z560 Unemployment, unspecified: Secondary | ICD-10-CM

## 2017-08-29 DIAGNOSIS — Z202 Contact with and (suspected) exposure to infections with a predominantly sexual mode of transmission: Secondary | ICD-10-CM | POA: Insufficient documentation

## 2017-08-29 DIAGNOSIS — F17210 Nicotine dependence, cigarettes, uncomplicated: Secondary | ICD-10-CM

## 2017-08-29 DIAGNOSIS — F5105 Insomnia due to other mental disorder: Secondary | ICD-10-CM

## 2017-08-29 DIAGNOSIS — Z87442 Personal history of urinary calculi: Secondary | ICD-10-CM | POA: Insufficient documentation

## 2017-08-29 DIAGNOSIS — F319 Bipolar disorder, unspecified: Secondary | ICD-10-CM

## 2017-08-29 DIAGNOSIS — F1921 Other psychoactive substance dependence, in remission: Secondary | ICD-10-CM

## 2017-08-29 MED ORDER — DOXYCYCLINE HYCLATE 100 MG PO TABS: 100.0000 mg | ORAL_TABLET | Freq: Two times a day (BID) | ORAL | 0 refills | Status: DC

## 2017-08-29 MED ORDER — LAMOTRIGINE 100 MG PO TABS: ORAL_TABLET | ORAL | 0 refills | Status: DC

## 2017-08-29 MED ORDER — GABAPENTIN 300 MG PO CAPS: 300.0000 mg | ORAL_CAPSULE | Freq: Three times a day (TID) | ORAL | 1 refills | Status: DC

## 2017-08-29 MED ORDER — TRAZODONE HCL 50 MG PO TABS: 150.0000 mg | ORAL_TABLET | Freq: Every evening | ORAL | 1 refills | Status: DC | PRN

## 2017-08-29 MED ORDER — FLUCONAZOLE 150 MG PO TABS: 150.0000 mg | ORAL_TABLET | Freq: Once | ORAL | 0 refills | Status: AC

## 2017-08-29 MED FILL — lamoTRIgine 100 MG TABS: 100 | 30 days supply | Qty: 60 | Fill #0

## 2017-08-29 MED FILL — GABAPENTIN 300 MG CAPSULE: 300 | 30 days supply | Qty: 90 | Fill #0

## 2017-08-29 MED FILL — ?DOXYCYCLINE 100MG TABLET: 100 | 10 days supply | Qty: 20 | Fill #0

## 2017-08-29 MED FILL — FLUCONAZOLE 150 MG TABLET: 150 | 1 days supply | Qty: 1 | Fill #0

## 2017-08-29 MED FILL — traZODone HCL 50 MG TABS: 50 | 30 days supply | Qty: 90 | Fill #0

## 2017-08-29 NOTE — Progress Notes (Signed)
Patient would like to have STD screening.

## 2017-08-29 NOTE — Progress Notes (Signed)
Patient ID: Katie Sherman, female   DOB: 1965/09/18, 52 y.o.   MRN: [id number]     Kamilia Carollo, is a 52 y.o. female  [id number]  [id number]  DOB - 10-30-1965  Subjective:  Chief Complaint and HPI: Katie Sherman is a 52 y.o. female here today for New partner in January 2019.  Now has a few "boils" in her vaginal region and a slight vaginal discharge odor.  No pelvic pain. No dysuria.  No f/c.  No abdominal or pelvic pain.  She noticed that her new BF is taking amoxicillin fo a "tooth infection" and became suspicious that he might have an STI.     ROS:   Constitutional:  No f/c, No night sweats, No unexplained weight loss. EENT:  No vision changes, No blurry vision, No hearing changes. No mouth, throat, or ear problems.  Respiratory: No cough, No SOB Cardiac: No CP, no palpitations GI:  No abd pain, No N/V/D. GU: No Urinary s/sx Musculoskeletal: No joint pain Neuro: No headache, no dizziness, no motor weakness.  Skin: No rash Endocrine:  No polydipsia. No polyuria.  Psych: Denies SI/HI  No problems updated.  ALLERGIES: No Active Allergies  PAST MEDICAL HISTORY: Past Medical History:  Diagnosis Date  . Cervical cancer ([place])   . Cervical cancer ([place])   . History of exercise stress test    ETT-Echo 5/18: normal EF, no ischemia  . History of loop recorder    currently on person  . Kidney stone     MEDICATIONS AT HOME: Prior to Admission medications   Medication Sig Start Date End Date Taking? Authorizing Provider  cetirizine (ZYRTEC) 10 MG tablet Take 1 tablet (10 mg total) by mouth daily. Prn itching 08/22/17  Yes Emmalynne Courtney M, PA-C  cyclobenzaprine (FLEXERIL) 10 MG tablet TAKE 1 TABLET BY MOUTH 3 TIMES DAILY AS NEEDED FOR MUSCLE SPASMS. 06/28/17  Yes Hairston, Maylon Peppers, FNP  diclofenac (VOLTAREN) 75 MG EC tablet Take 1 tablet (75 mg total) by mouth 2 (two) times daily. 07/13/17  Yes Hairston, Maylon Peppers, FNP  gabapentin (NEURONTIN) 300 MG capsule Take 1  capsule (300 mg total) by mouth 3 (three) times daily. 08/29/17  Yes Arfeen, Arlyce Harman, MD  hydrocortisone 1 % lotion Apply 1 application topically 2 (two) times daily. 08/22/17  Yes Argentina Donovan, PA-C  hydrOXYzine (ATARAX/VISTARIL) 25 MG tablet Take 1 tablet (25 mg total) by mouth 3 (three) times daily as needed. 08/22/17  Yes Argentina Donovan, PA-C  lamoTRIgine (LAMICTAL) 100 MG tablet Take 1/2 tab daily for 1 week, than one tab daily for next week, 150 mg for next one week and than 2 tab daily. 08/29/17  Yes Arfeen, Arlyce Harman, MD  mupirocin ointment (BACTROBAN) 2 % Apply bid to infected skin X 1 week 08/22/17  Yes Gina Costilla M, PA-C  omega-3 acid ethyl esters (LOVAZA) 1 g capsule Take 1 capsule (1 g total) by mouth 2 (two) times daily. For high cholesterol 07/13/17  Yes Hairston, Mandesia R, FNP  omeprazole (PRILOSEC) 20 MG capsule Take 1 capsule (20 mg total) by mouth daily. 07/13/17  Yes Alfonse Spruce, FNP  pravastatin (PRAVACHOL) 80 MG tablet Take 1 tablet (80 mg total) by mouth daily at 6 PM. For high cholesterol 07/18/17  Yes Hairston, Mandesia R, FNP  traZODone (DESYREL) 50 MG tablet Take 3 tablets (150 mg total) by mouth at bedtime as needed for sleep. 08/29/17  Yes Arfeen, Arlyce Harman, MD  doxycycline (VIBRA-TABS) 100 MG tablet  Take 1 tablet (100 mg total) by mouth 2 (two) times daily. 08/29/17   Argentina Donovan, PA-C  fluconazole (DIFLUCAN) 150 MG tablet Take 1 tablet (150 mg total) by mouth once for 1 dose. If needed for yeast infection 08/29/17 08/29/17  Argentina Donovan, PA-C  meclizine (ANTIVERT) 25 MG tablet Take 1 tablet (25 mg total) by mouth 3 (three) times daily as needed for dizziness. Patient not taking: Reported on 08/29/2017 07/13/17   Alfonse Spruce, FNP     Objective:  EXAM:   Vitals:   08/29/17 1521  BP: 116/74  Pulse: 63  Resp: 16  Temp: 97.7 F (36.5 C)  TempSrc: Oral  SpO2: 98%  Weight: 190 lb 9.6 oz (86.5 kg)  Height: 5\' 6"  (1.676 m)    General appearance  : A&OX3. NAD. Non-toxic-appearing HEENT: Atraumatic and Normocephalic.  PERRLA. EOM intact.  TM clear B. Mouth-MMM, post pharynx WNL w/o erythema, No PND. Neck: supple, no JVD. No cervical lymphadenopathy. No thyromegaly Chest/Lungs:  Breathing-non-labored, Good air entry bilaterally, breath sounds normal without rales, rhonchi, or wheezing  CVS: S1 S2 regular, no murmurs, gallops, rubs  GU: 1 small boil on upper inner R thigh and on L buttocks-both <1cm and slightly fluctuant, not indurated, mild erythema.  External genitalia without lesion.  speculum inserted.  Scant whitish d/c.  Cervix appears WNL.  Swab taken.  Bimanual unremarkable.   Extremities: Bilateral Lower Ext shows no edema, both legs are warm to touch with = pulse throughout Neurology:  CN II-XII grossly intact, Non focal.   Psych:  TP linear. J/I WNL. Normal speech. Appropriate eye contact and affect.  Skin:  No Rash  Data Review Lab Results  Component Value Date   HGBA1C 5.6 04/13/2017   HGBA1C 5.3 12/30/2016     Assessment & Plan   1. Exposure to STD Await testing- - HIV antibody (with reflex) - RPR - Cervicovaginal ancillary only  2. Boil - doxycycline (VIBRA-TABS) 100 MG tablet; Take 1 tablet (100 mg total) by mouth 2 (two) times daily.  Dispense: 20 tablet; Refill: 0(will also cover for chlamydia if warrnated)   - fluconazole (DIFLUCAN) 150 MG tablet; Take 1 tablet (150 mg total) by mouth once for 1 dose. If needed for yeast infection  Dispense: 1 tablet; Refill: 0   Patient have been counseled extensively about nutrition and exercise  Return if symptoms worsen or fail to improve.  The patient was given clear instructions to go to ER or return to medical center if symptoms don't improve, worsen or new problems develop. The patient verbalized understanding. The patient was told to call to get lab results if they haven't heard anything in the next week.     Freeman Caldron, PA-C [facility]  and [place], [place]   08/29/2017, 4:02 PM
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
## 2017-08-30 LAB — RPR: RPR: NONREACTIVE

## 2017-08-30 LAB — CERVICOVAGINAL ANCILLARY ONLY
Bacterial vaginitis: POSITIVE — AB
Candida vaginitis: POSITIVE — AB
Chlamydia: NEGATIVE
NEISSERIA GONORRHEA: NEGATIVE
TRICH (WINDOWPATH): NEGATIVE

## 2017-08-30 LAB — HIV ANTIBODY (ROUTINE TESTING W REFLEX): HIV Screen 4th Generation wRfx: NONREACTIVE

## 2017-08-31 ENCOUNTER — Other Ambulatory Visit: Payer: Self-pay | Admitting: Physician Assistant

## 2017-08-31 MED ORDER — METRONIDAZOLE 500 MG PO TABS: 500.0000 mg | ORAL_TABLET | Freq: Two times a day (BID) | ORAL | 0 refills | Status: DC

## 2017-08-31 MED ORDER — FLUCONAZOLE 150 MG PO TABS: 150.0000 mg | ORAL_TABLET | Freq: Once | ORAL | 0 refills | Status: AC

## 2017-08-31 MED FILL — ?METRONIDAZOLE 500MG TABS: 500 | 7 days supply | Qty: 14 | Fill #0

## 2017-08-31 MED FILL — FLUCONAZOLE 150 MG TABLET: 150 | 1 days supply | Qty: 1 | Fill #0

## 2017-09-03 ENCOUNTER — Telehealth: Payer: Self-pay

## 2017-09-03 NOTE — Telephone Encounter (Signed)
Contacted pt to go over culture results and pt stated she seen results on MyChart

## 2017-09-04 ENCOUNTER — Other Ambulatory Visit: Payer: Self-pay | Admitting: Pharmacist

## 2017-09-04 DIAGNOSIS — L42 Pityriasis rosea: Secondary | ICD-10-CM

## 2017-09-04 MED ORDER — HYDROXYZINE HCL 25 MG PO TABS: 25.0000 mg | ORAL_TABLET | Freq: Three times a day (TID) | ORAL | 0 refills | Status: DC | PRN

## 2017-09-04 MED FILL — hydrOXYzine HCL 25 MG TABS: 25 | 10 days supply | Qty: 30 | Fill #0

## 2017-09-05 ENCOUNTER — Ambulatory Visit: Payer: No Typology Code available for payment source | Attending: Internal Medicine

## 2017-09-07 MED FILL — PRAVASTATIN SODIUM 80 MG TA: 80 | 30 days supply | Qty: 30 | Fill #1

## 2017-09-10 ENCOUNTER — Other Ambulatory Visit: Payer: Self-pay | Admitting: Pharmacist

## 2017-09-10 DIAGNOSIS — E782 Mixed hyperlipidemia: Secondary | ICD-10-CM

## 2017-09-10 MED ORDER — OMEGA-3-ACID ETHYL ESTERS 1 G PO CAPS: 1.0000 g | ORAL_CAPSULE | Freq: Two times a day (BID) | ORAL | 2 refills | Status: DC

## 2017-09-10 MED FILL — ?OMEPRAZOLE 20 MG CPDR: 20 | 30 days supply | Qty: 30 | Fill #2

## 2017-09-10 MED FILL — OMEGA-3 ETHYL ESTERS 1 GM C: 1 | 30 days supply | Qty: 60 | Fill #2

## 2017-10-02 ENCOUNTER — Ambulatory Visit (INDEPENDENT_AMBULATORY_CARE_PROVIDER_SITE_OTHER): Payer: No Typology Code available for payment source | Admitting: Psychiatry

## 2017-10-02 ENCOUNTER — Encounter (HOSPITAL_COMMUNITY): Payer: Self-pay | Admitting: Psychiatry

## 2017-10-02 DIAGNOSIS — F419 Anxiety disorder, unspecified: Secondary | ICD-10-CM

## 2017-10-02 DIAGNOSIS — Z915 Personal history of self-harm: Secondary | ICD-10-CM

## 2017-10-02 DIAGNOSIS — F17210 Nicotine dependence, cigarettes, uncomplicated: Secondary | ICD-10-CM

## 2017-10-02 DIAGNOSIS — L42 Pityriasis rosea: Secondary | ICD-10-CM

## 2017-10-02 DIAGNOSIS — F5105 Insomnia due to other mental disorder: Secondary | ICD-10-CM

## 2017-10-02 DIAGNOSIS — F4310 Post-traumatic stress disorder, unspecified: Secondary | ICD-10-CM

## 2017-10-02 DIAGNOSIS — F1921 Other psychoactive substance dependence, in remission: Secondary | ICD-10-CM

## 2017-10-02 DIAGNOSIS — F99 Mental disorder, not otherwise specified: Secondary | ICD-10-CM

## 2017-10-02 DIAGNOSIS — F319 Bipolar disorder, unspecified: Secondary | ICD-10-CM

## 2017-10-02 MED ORDER — TRAZODONE HCL 50 MG PO TABS: 150.0000 mg | ORAL_TABLET | Freq: Every evening | ORAL | 1 refills | Status: DC | PRN

## 2017-10-02 MED ORDER — LAMOTRIGINE 100 MG PO TABS: 300.0000 mg | ORAL_TABLET | Freq: Every day | ORAL | 1 refills | Status: DC

## 2017-10-02 MED ORDER — HYDROXYZINE HCL 50 MG PO TABS: 50.0000 mg | ORAL_TABLET | Freq: Three times a day (TID) | ORAL | 1 refills | Status: DC | PRN

## 2017-10-02 MED ORDER — GABAPENTIN 300 MG PO CAPS: 300.0000 mg | ORAL_CAPSULE | Freq: Three times a day (TID) | ORAL | 1 refills | Status: DC

## 2017-10-02 MED FILL — ?LAMOTRIGINE 100MG TABLET: 100 | 30 days supply | Qty: 90 | Fill #0

## 2017-10-02 MED FILL — hydrOXYzine HCL 50 MG TABS: 50 | 10 days supply | Qty: 30 | Fill #0

## 2017-10-02 MED FILL — GABAPENTIN 300 MG CAPSULE: 300 | 30 days supply | Qty: 90 | Fill #0

## 2017-10-02 MED FILL — traZODone HCL 50 MG TABS: 50 | 30 days supply | Qty: 90 | Fill #0
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
## 2017-10-03 ENCOUNTER — Ambulatory Visit: Payer: Self-pay | Admitting: Family Medicine

## 2017-10-10 MED FILL — PRAVASTATIN SODIUM 80 MG TA: 80 | 30 days supply | Qty: 30 | Fill #2

## 2017-10-10 MED FILL — ?OMEPRAZOLE DR 20MG CAPSULE: 20 | 30 days supply | Qty: 30 | Fill #3

## 2017-10-17 ENCOUNTER — Ambulatory Visit: Payer: Self-pay | Attending: Internal Medicine | Admitting: Physician Assistant

## 2017-10-17 VITALS — BP 114/77 | HR 80 | Temp 98.2°F | Resp 18 | Ht 66.0 in | Wt 199.6 lb

## 2017-10-17 DIAGNOSIS — Z8541 Personal history of malignant neoplasm of cervix uteri: Secondary | ICD-10-CM | POA: Insufficient documentation

## 2017-10-17 DIAGNOSIS — Z79899 Other long term (current) drug therapy: Secondary | ICD-10-CM | POA: Insufficient documentation

## 2017-10-17 DIAGNOSIS — N898 Other specified noninflammatory disorders of vagina: Secondary | ICD-10-CM | POA: Insufficient documentation

## 2017-10-17 DIAGNOSIS — Z202 Contact with and (suspected) exposure to infections with a predominantly sexual mode of transmission: Secondary | ICD-10-CM | POA: Insufficient documentation

## 2017-10-17 LAB — POCT URINALYSIS DIPSTICK
Bilirubin, UA: NEGATIVE
Blood, UA: NEGATIVE
GLUCOSE UA: NEGATIVE
Ketones, UA: NEGATIVE
Leukocytes, UA: NEGATIVE
Nitrite, UA: NEGATIVE
Protein, UA: NEGATIVE
Spec Grav, UA: <=1.005 — AB (ref 1.010–1.025)
Urobilinogen, UA: 0.2 E.U./dL
pH, UA: 7 (ref 5.0–8.0)

## 2017-10-17 NOTE — Progress Notes (Signed)
Patient ID: Katie Sherman, female   DOB: 10-26-1965, 52 y.o.   MRN: [id number]     Katie Sherman, is a 52 y.o. female  TIR:[id number]  QPY:[id number]  DOB - [birth year]  Subjective:  Chief Complaint and HPI: Katie Sherman is a 52 y.o. female here today for concerns for STI exposure. Recent testing negative except for BV. Also did HIV and RPR testing.  Recently ended relationship with partner who may have [place] another woman [place] then beach.  Some vaginal discharge.  No dysuria   ROS:   Constitutional:  No f/c, No night sweats, No unexplained weight loss. EENT:  No vision changes, No blurry vision, No hearing changes. No mouth, throat, or ear problems.  Respiratory: No cough, No SOB Cardiac: No CP, no palpitations GI:  No abd pain, No N/V/D. GU: No Urinary s/sx Musculoskeletal: No joint pain Neuro: No headache, no dizziness, no motor weakness.  Skin: No rash Endocrine:  No polydipsia. No polyuria.  Psych: Denies SI/HI  No problems updated.  ALLERGIES: No Active Allergies  PAST MEDICAL HISTORY: Past Medical History:  Diagnosis Date  . Cervical cancer ([place])   . Cervical cancer ([place])   . History of exercise stress test    ETT-Echo 5/18: normal EF, no ischemia  . History of loop recorder    currently on person  . Kidney stone     MEDICATIONS AT HOME: Prior to Admission medications   Medication Sig Start Date End Date Taking? Authorizing Provider  cetirizine (ZYRTEC) 10 MG tablet Take 1 tablet (10 mg total) by mouth daily. Prn itching 08/22/17   Argentina Donovan, PA-C  cyclobenzaprine (FLEXERIL) 10 MG tablet TAKE 1 TABLET BY MOUTH 3 TIMES DAILY AS NEEDED FOR MUSCLE SPASMS. 06/28/17   Alfonse Spruce, FNP  diclofenac (VOLTAREN) 75 MG EC tablet Take 1 tablet (75 mg total) by mouth 2 (two) times daily. 07/13/17   Alfonse Spruce, FNP  doxycycline (VIBRA-TABS) 100 MG tablet Take 1 tablet (100 mg total) by mouth 2 (two) times daily. 08/29/17   Argentina Donovan, PA-C    gabapentin (NEURONTIN) 300 MG capsule Take 1 capsule (300 mg total) by mouth 3 (three) times daily. 10/02/17   Arfeen, Arlyce Harman, MD  hydrocortisone 1 % lotion Apply 1 application topically 2 (two) times daily. 08/22/17   Argentina Donovan, PA-C  hydrOXYzine (ATARAX/VISTARIL) 50 MG tablet Take 1 tablet (50 mg total) by mouth 3 (three) times daily as needed. 10/02/17   Arfeen, Arlyce Harman, MD  lamoTRIgine (LAMICTAL) 100 MG tablet Take 3 tablets (300 mg total) by mouth daily. 10/02/17   Arfeen, Arlyce Harman, MD  meclizine (ANTIVERT) 25 MG tablet Take 1 tablet (25 mg total) by mouth 3 (three) times daily as needed for dizziness. Patient not taking: Reported on 08/29/2017 07/13/17   Alfonse Spruce, FNP  metroNIDAZOLE (FLAGYL) 500 MG tablet Take 1 tablet (500 mg total) by mouth 2 (two) times daily. 08/31/17   Argentina Donovan, PA-C  mupirocin ointment (BACTROBAN) 2 % Apply bid to infected skin X 1 week 08/22/17   Argentina Donovan, PA-C  omega-3 acid ethyl esters (LOVAZA) 1 g capsule Take 1 capsule (1 g total) by mouth 2 (two) times daily. For high cholesterol 09/10/17   Tresa Garter, MD  omeprazole (PRILOSEC) 20 MG capsule Take 1 capsule (20 mg total) by mouth daily. 07/13/17   Alfonse Spruce, FNP  pravastatin (PRAVACHOL) 80 MG tablet Take 1 tablet (80 mg total) by mouth  daily at 6 PM. For high cholesterol 07/18/17   Alfonse Spruce, FNP  traZODone (DESYREL) 50 MG tablet Take 3 tablets (150 mg total) by mouth at bedtime as needed for sleep. 10/02/17   Arfeen, Arlyce Harman, MD     Objective:  EXAM:   Vitals:   10/17/17 1057  BP: 114/77  Pulse: 80  Resp: 18  Temp: 98.2 F (36.8 C)  TempSrc: Oral  SpO2: 99%  Weight: 199 lb 9.6 oz (90.5 kg)  Height: 5\' 6"  (1.676 m)    General appearance : A&OX3. NAD. Non-toxic-appearing HEENT: Atraumatic and Normocephalic.  PERRLA. EOM intact.   Neck: supple, no JVD. No cervical lymphadenopathy. No thyromegaly Chest/Lungs:  Breathing-non-labored, Good air entry  bilaterally, breath sounds normal without rales, rhonchi, or wheezing  CVS: S1 S2 regular, no murmurs, gallops, rubs  Extremities: Bilateral Lower Ext shows no edema, both legs are warm to touch with = pulse throughout Neurology:  CN II-XII grossly intact, Non focal.   Psych:  TP linear. J/I WNL. Normal speech. Appropriate eye contact and affect.  Skin:  No Rash  Data Review Lab Results  Component Value Date   HGBA1C 5.6 04/13/2017   HGBA1C 5.3 12/30/2016     Assessment & Plan   1. Vaginal discharge Possible but not definite STI exposure.   - Urinalysis Dipstick - Urine cytology ancillary only    Patient have been counseled extensively about nutrition and exercise  Return for keep 5/14 appt with Geryl Rankins.  The patient was given clear instructions to go to ER or return to medical center if symptoms don't improve, worsen or new problems develop. The patient verbalized understanding. The patient was told to call to get lab results if they haven't heard anything in the next week.     Freeman Caldron, PA-C [facility], [place]   10/17/2017, 11:32 AM

## 2017-10-18 LAB — URINE CYTOLOGY ANCILLARY ONLY
Chlamydia: POSITIVE — AB
NEISSERIA GONORRHEA: NEGATIVE
Trichomonas: NEGATIVE

## 2017-10-20 LAB — URINE CYTOLOGY ANCILLARY ONLY
Bacterial vaginitis: NEGATIVE
Candida vaginitis: NEGATIVE

## 2017-10-21 ENCOUNTER — Other Ambulatory Visit: Payer: Self-pay | Admitting: Physician Assistant

## 2017-10-21 MED ORDER — AZITHROMYCIN 250 MG PO TABS: ORAL_TABLET | ORAL | 0 refills | Status: DC

## 2017-10-21 MED ORDER — FLUCONAZOLE 150 MG PO TABS: 150.0000 mg | ORAL_TABLET | Freq: Once | ORAL | 0 refills | Status: AC

## 2017-10-22 MED FILL — AZITHROMYCIN 250 MG TABLET: 250 | 1 days supply | Qty: 4 | Fill #0

## 2017-10-22 MED FILL — FLUCONAZOLE 150 MG TABLET: 150 | 1 days supply | Qty: 1 | Fill #0

## 2017-10-22 NOTE — Progress Notes (Signed)
Patient is aware of results and states she will pick up the medication on her day off tomorrow. Patient states partner has left town since.

## 2017-10-25 MED FILL — traZODone HCL 50 MG TABS: 50 | 30 days supply | Qty: 90 | Fill #1

## 2017-11-01 MED FILL — hydrOXYzine HCL 50 MG TABS: 50 | 10 days supply | Qty: 30 | Fill #1

## 2017-11-01 MED FILL — ?LAMOTRIGINE 100MG TABLET: 100 | 30 days supply | Qty: 90 | Fill #1

## 2017-11-06 ENCOUNTER — Ambulatory Visit (INDEPENDENT_AMBULATORY_CARE_PROVIDER_SITE_OTHER): Payer: Self-pay | Admitting: Family Medicine

## 2017-11-06 ENCOUNTER — Encounter: Payer: Self-pay | Admitting: Family Medicine

## 2017-11-06 VITALS — BP 110/70 | Ht 67.0 in | Wt 180.0 lb

## 2017-11-06 DIAGNOSIS — G8929 Other chronic pain: Secondary | ICD-10-CM

## 2017-11-06 DIAGNOSIS — M7061 Trochanteric bursitis, right hip: Secondary | ICD-10-CM

## 2017-11-06 DIAGNOSIS — M25561 Pain in right knee: Secondary | ICD-10-CM

## 2017-11-06 DIAGNOSIS — M25551 Pain in right hip: Secondary | ICD-10-CM

## 2017-11-06 DIAGNOSIS — M25461 Effusion, right knee: Secondary | ICD-10-CM

## 2017-11-06 MED ORDER — METHYLPREDNISOLONE ACETATE 40 MG/ML IJ SUSP
40.0000 mg | Freq: Once | INTRAMUSCULAR | Status: AC
  Administered 2017-11-06: 40 mg via INTRA_ARTICULAR

## 2017-11-06 NOTE — Progress Notes (Signed)
Chief complaint: Acute right knee pain and swelling 2 weeks, continued right lateral hip pain  History of present illness: Katie Sherman is a 52 year old female who presents to sports medicine office today with chief complaint of acute right knee pain and swelling.  She reports that symptoms have been present for approximately 2 weeks.  She does not report of any specific inciting incident, trauma, or injury to explain the pain.  She does work in a [employer], is often on her feet for more than 10 to 12 hours at a time for 5 to 6 days a week.  She reports noticing painful swelling and pain with either flexing or extending her right knee.she does not report of any fevers, chills, night sweats, or any unintentional weight loss. She describes the pain as a sharp,, stabbing, nonradiating pain.  She reports noticing that it particularly worse after standing for prolonged periods of time.   She has had a previous x-ray of her right knee, Which was done on 01/29/17.  Fortunately, no arthritic changes were seen on her x-rays.  She has had a cortisone injection into her right knee before.  No interval injury or trauma that she could recall.  She does report limitations in range of motion secondary to pain in the swelling.  She does report of pain waking her up from sleep at nighttime.  Rest and not moving her right knee are only alleviating factors.  Due to her right knee pain, she reports that her right lateral hip is still bothering to her.  She has been having issues related to greater trochanter pain syndrome and bursitis.  She has had multiple injections in the greater trochanter bursa as well as formal physical therapy, with no interval improvement in her symptoms.  She did have an x-ray of her right hip back on 07/18/17, which did not show any acute bony abnormalities.  Review of systems:  As stated above  Interval past medical history, surgical history, family history, and social history obtained and  unchanged. Her past medical history is notable for insomnia  and bipolar disorder; surgical history notable for cervical cone biopsy and tubal ligation; she does report a current tobacco use on daily basis, 1 pack per day for the last 36 years; family history notable for CAD and breast cancer; allergies and medications are reviewed and are reflected in EMR.  Physical exam: Vital signs are reviewed and are documented in the chart Gen.: Alert, oriented, appears stated age, in no apparent distress HEENT: Moist oral mucosa Respiratory: Normal respirations, able to speak in full sentences Cardiac: Regular rate, distal pulses 2+ Integumentary: No rashes on visible skin:  Neurologic: She does have decreased strength with knee flexion and knee extension of her right knee secondary to pain, would categorize strength as 4/5, sensation 2+ in bilateral lower extremities, she still has weakness with hip abduction on the right side, would also categorize strength as 4/5 Psych: Normal affect, mood is described as good Musculoskeletal: Inspection of right knee reveals that she does have obvious suprapatellar effusion noted,, would categorize it as 1-2+, slight warmth, no erythema or ecchymosis noted, she is tender palpation diffusely in the right knee, mainly along the anterolateral where the effusion is most prominent, No signs of ligamentous instability is Lachman, anterior drawer, valgus, varus stress testing negative, unable to fully perform McMurray secondary to pain, her range of motion today is from 0 to 100, pain prevents her from going further, she does walk with an antalgic gait  favoring the right side, she is still tender to palpation over the right greater trochanteric bursal region  Procedure: After written informed consent signed and obtained, and benefit of pain relief and risk of bleeding, infection, and steroid flare discussed, Katie Sherman agreed to proceed with aspiration and cortisone injection into  the right knee . After timeout, area  was cleaned with Betadine and alcohol wipes. Ethyl chloride was used as topical anesthetic spray. Using  10 cc 1% lidocaine without epinephrine, from superolateral approach the suprapatellar pouch was injected. Subsequently, approximately 30 cc of clear, synovial appearing fluid was aspirated and 2 cc of 40 mg Depo-Medrol  was injected. She did not have any bleeding afterwards. No complications noted from procedure.    Assessment and plan: 1. Acute right knee pain with effusion 2. Refractory chronic right lateral hip pain with clinical symptoms suggestive of right greater trochanteric bursitis 3. History of left shoulder adhesive capsulitis  Plan: Ultimately discussed with Katie Sherman today given acute right knee pain and swelling, discussed option of aspiration with subsequent injection of cortisone into the right knee.  She is agreeable to this.  Injection done as noted above without any complications noted.  Discussed given that her right knee otherwise looks unremarkable, I would like to send the aspirate off for Gram stain, fluid cytology,and culture to rule out any type of infection, crystal arthropathy, or any type of rheumatologic cause to explain the swelling in her right knee.  Discussed compression of the right knee.  Given refractory right lateral hip pain despite  Conservative measures to include 2 cortisone injections,  Home in formal physical therapy, as well as oral anti-inflammatory medications, as well as x-ray not showing any bony abnormality, do feel that next best step would be to obtain an MRI of her right hip to rule out anything else that could explain her chronic pain that she is having. She will follow up in 4 weeks or sooner as needed.  I will call her after lab work and MRI have been obtained otherwise.   Mort Sawyers, M.D. [facility]

## 2017-11-07 LAB — SYNOVIAL FLUID, CELL COUNT
Eos, Fluid: 0 %
Lining Cells, Synovial: 0 %
Lymphs, Fluid: 60 %
MACROPHAGES FLD: 32 %
NUC CELL # FLD: 118 {cells}/uL (ref 0–200)
POLYS FL: 8 %

## 2017-11-08 MED FILL — PRAVASTATIN SODIUM 80 MG TA: 80 | 30 days supply | Qty: 30 | Fill #3

## 2017-11-09 LAB — GRAM STAIN: ORGANISM ID, BACTERIA: NONE SEEN

## 2017-11-10 ENCOUNTER — Ambulatory Visit
Admission: RE | Admit: 2017-11-10 | Discharge: 2017-11-10 | Disposition: A | Discharge status: Home / Self Care | Payer: No Typology Code available for payment source | Source: Ambulatory Visit | Attending: Family Medicine | Admitting: Family Medicine

## 2017-11-10 DIAGNOSIS — M25551 Pain in right hip: Principal | ICD-10-CM

## 2017-11-10 DIAGNOSIS — G8929 Other chronic pain: Secondary | ICD-10-CM

## 2017-11-11 LAB — BODY FLUID CULTURE

## 2017-11-12 MED FILL — GABAPENTIN 300 MG CAPSULE: 300 | 30 days supply | Qty: 90 | Fill #1

## 2017-11-13 ENCOUNTER — Other Ambulatory Visit: Payer: Self-pay | Admitting: *Deleted

## 2017-11-13 ENCOUNTER — Telehealth: Payer: Self-pay | Admitting: Family Medicine

## 2017-11-13 MED ORDER — NITROGLYCERIN 0.2 MG/HR TD PT24: MEDICATED_PATCH | TRANSDERMAL | 1 refills | Status: DC

## 2017-11-13 MED FILL — NITROGLYCERIN 0.2 MG/HR PAT: 0.2 | 28 days supply | Qty: 7 | Fill #0

## 2017-11-13 NOTE — Telephone Encounter (Signed)
Called and spoke to [place] this morning at 0850 to discuss a few things.  First off, discussed that her knee fluid cytology, culture, and Gram stain were all unremarkable for either infection, crystals, or any sort of autoimmune/inflammatory cause to her symptoms.  She reports to me this morning that her right knee is much better and the swelling has improved.  I discussed most likely just being on her feet for long periods of time and minor amount of arthritis where the main aggravating factors to this.  In addition, she did have MRI of her right hip due to chronic right lateral hip pain.  Discussed with her that the main finding showed right gluteus tendinosis and peri-trochanteric bursal fluid, confirming our suspicion that she is just been having chronic issue of gluteus medius syndrome and greater trochanteric bursal inflammation and irritation.  She also has mild contralateral left gluteus minimus tendinosis.  There was no intra-articular hip pathology or labral pathology noted.  No other bony pathology noted.  Discussed we will have her started on nitroglycerin protocol, to use one quarter size patch in place on affected area for 24 hours, place maximum site of tenderness, slightly rotating site on daily basis to minimize risk of rash or skin irritation.  She does not have any contraindications including migraine headaches or rosacea.  Discussed side effects do include local skin irritation and migraines.  Discussed to notify us if this were to occur.  We will have her start this for a few weeks then have her restart formal physical therapy.  I would like to see her in the office in the next 6 weeks for follow-up.  She does understand and agrees with plan.    Mort Sawyers, MD Primary Care Sports Medicine Fellow [facility]

## 2017-11-14 ENCOUNTER — Encounter: Payer: Self-pay | Admitting: Internal Medicine

## 2017-11-14 ENCOUNTER — Ambulatory Visit: Payer: Self-pay | Attending: Internal Medicine | Admitting: Internal Medicine

## 2017-11-14 VITALS — BP 111/70 | HR 77 | Temp 97.7°F | Resp 16 | Ht 66.5 in | Wt 195.6 lb

## 2017-11-14 DIAGNOSIS — Z79899 Other long term (current) drug therapy: Secondary | ICD-10-CM | POA: Insufficient documentation

## 2017-11-14 DIAGNOSIS — F3130 Bipolar disorder, current episode depressed, mild or moderate severity, unspecified: Secondary | ICD-10-CM | POA: Insufficient documentation

## 2017-11-14 DIAGNOSIS — F17210 Nicotine dependence, cigarettes, uncomplicated: Secondary | ICD-10-CM | POA: Insufficient documentation

## 2017-11-14 DIAGNOSIS — Z803 Family history of malignant neoplasm of breast: Secondary | ICD-10-CM | POA: Insufficient documentation

## 2017-11-14 DIAGNOSIS — K219 Gastro-esophageal reflux disease without esophagitis: Secondary | ICD-10-CM | POA: Insufficient documentation

## 2017-11-14 DIAGNOSIS — E782 Mixed hyperlipidemia: Secondary | ICD-10-CM | POA: Insufficient documentation

## 2017-11-14 DIAGNOSIS — Z1211 Encounter for screening for malignant neoplasm of colon: Secondary | ICD-10-CM

## 2017-11-14 DIAGNOSIS — Z9189 Other specified personal risk factors, not elsewhere classified: Secondary | ICD-10-CM

## 2017-11-14 MED ORDER — OMEGA-3-ACID ETHYL ESTERS 1 G PO CAPS: 1.0000 g | ORAL_CAPSULE | Freq: Two times a day (BID) | ORAL | 1 refills | Status: DC

## 2017-11-14 MED ORDER — OMEPRAZOLE 20 MG PO CPDR: 20.0000 mg | DELAYED_RELEASE_CAPSULE | Freq: Every day | ORAL | 1 refills | Status: DC

## 2017-11-14 MED FILL — ?OMEPRAZOLE DR 20MG CAPSULE: 20 | 30 days supply | Qty: 30 | Fill #0

## 2017-11-14 MED FILL — OMEGA-3 ETHYL ESTERS 1 GM C: 1 | 30 days supply | Qty: 60 | Fill #0

## 2017-11-14 NOTE — Progress Notes (Signed)
Patient ID: Katie Sherman, female    DOB: 06/21/1966  MRN: [id number]  CC: re-establish and Medication Management   Subjective: Katie Sherman is a 52 y.o. female who presents for chronic ds.   Used to see NP Hairston who has left the practice. Her concerns today include:  Pt with hx of GERD, HL, bipolar 1, chronic LT shoulder and Rt hip pain.    Pt needing RF on Omeprazole and Lovaza.  Seeing sports medicine for RT hip and knee pain.  Recent MRI hip revealed gluteus tendinosis.  Prescribed Nito patch to use on hip.  Wearing brace on RT knee  HM:  Over due for MMG.  Fhx of MMG in maternal GM and aunt. Last MMG was at age 56.  It was an unpleasant experience and she does not wish to have one any more.  Agreeable to colon CA screen and request dental referral.  Patient Active Problem List   Diagnosis Date Noted  . 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 Postmenopausal 01/22/2017  . Bipolar 1 disorder, depressed ([place]) 12/29/2016  . Family history of suicide 11/20/2016  . Depression 11/20/2016  . Family history of breast cancer 11/20/2016  . Adhesive capsulitis of left shoulder 11/19/2016  . Greater trochanteric bursitis of right hip 11/19/2016     Current Outpatient Medications on File Prior to Visit  Medication Sig Dispense Refill  . azithromycin (ZITHROMAX) 250 MG tablet Take 4 tabs at once 4 each 0  . cetirizine (ZYRTEC) 10 MG tablet Take 1 tablet (10 mg total) by mouth daily. Prn itching 30 tablet 11  . cyclobenzaprine (FLEXERIL) 10 MG tablet TAKE 1 TABLET BY MOUTH 3 TIMES DAILY AS NEEDED FOR MUSCLE SPASMS. 30 tablet 0  . diclofenac (VOLTAREN) 75 MG EC tablet Take 1 tablet (75 mg total) by mouth 2 (two) times daily. 60 tablet 0  . doxycycline (VIBRA-TABS) 100 MG tablet Take 1 tablet (100 mg total) by mouth 2 (two) times daily. 20 tablet 0  . gabapentin (NEURONTIN) 300 MG capsule Take 1 capsule (300 mg total) by mouth 3 (three) times daily. 90 capsule 1  .  hydrocortisone 1 % lotion Apply 1 application topically 2 (two) times daily. 118 mL 0  . hydrOXYzine (ATARAX/VISTARIL) 50 MG tablet Take 1 tablet (50 mg total) by mouth 3 (three) times daily as needed. 30 tablet 1  . lamoTRIgine (LAMICTAL) 100 MG tablet Take 3 tablets (300 mg total) by mouth daily. 90 tablet 1  . meclizine (ANTIVERT) 25 MG tablet Take 1 tablet (25 mg total) by mouth 3 (three) times daily as needed for dizziness. (Patient not taking: Reported on 08/29/2017) 30 tablet 0  . metroNIDAZOLE (FLAGYL) 500 MG tablet Take 1 tablet (500 mg total) by mouth 2 (two) times daily. 14 tablet 0  . mupirocin ointment (BACTROBAN) 2 % Apply bid to infected skin X 1 week 22 g 0  . nitroGLYCERIN (NITRODUR - DOSED IN MG/24 HR) 0.2 mg/hr patch Use 1/4 patch daily to the affected area 30 patch 1  . omega-3 acid ethyl esters (LOVAZA) 1 g capsule Take 1 capsule (1 g total) by mouth 2 (two) times daily. For high cholesterol 60 capsule 2  . omeprazole (PRILOSEC) 20 MG capsule Take 1 capsule (20 mg total) by mouth daily. 30 capsule 3  . pravastatin (PRAVACHOL) 80 MG tablet Take 1 tablet (80 mg total) by mouth daily at 6 PM. For high cholesterol 30 tablet 5  . traZODone (DESYREL) 50  MG tablet Take 3 tablets (150 mg total) by mouth at bedtime as needed for sleep. 90 tablet 1   No current facility-administered medications on file prior to visit.     No Active Allergies  Social History   Socioeconomic History  . Marital status: Widowed    Spouse name: Not on file  . Number of children: Not on file  . Years of education: Not on file  . Highest education level: Not on file  Occupational History  . Occupation: unemployed  Social Needs  . Financial resource strain: Not on file  . Food insecurity:    Worry: Not on file    Inability: Not on file  . Transportation needs:    Medical: Not on file    Non-medical: Not on file  Tobacco Use  . Smoking status: Current Every Day Smoker    Packs/day: 1.00     Years: 36.00    Pack years: 36.00    Types: Cigarettes    Start date: 67  . Smokeless tobacco: Never Used  . Tobacco comment: 3 months without  Substance and Sexual Activity  . Alcohol use: No  . Drug use: No    Types: Marijuana    Comment: stopped 04/06/2017  . Sexual activity: Not Currently    Birth control/protection: None  Lifestyle  . Physical activity:    Days per week: Not on file    Minutes per session: Not on file  . Stress: Not on file  Relationships  . Social connections:    Talks on phone: Not on file    Gets together: Not on file    Attends religious service: Not on file    Active member of club or organization: Not on file    Attends meetings of clubs or organizations: Not on file    Relationship status: Not on file  . Intimate partner violence:    Fear of current or ex partner: Not on file    Emotionally abused: Not on file    Physically abused: Not on file    Forced sexual activity: Not on file  Other Topics Concern  . Not on file  Social History Narrative   Prior traffic control specialist - unemployed now   Widow   5 children, 10 grandchildren   Moved here from [place] 2013    Family History  Problem Relation Age of Onset  . Heart attack Mother 24  . Heart disease Father   . Heart attack Father 68    Past Surgical History:  Procedure Laterality Date  . CERVICAL CONE BIOPSY    . TUBAL LIGATION      ROS: Review of Systems Neg except as above PHYSICAL EXAM: BP 111/70   Pulse 77   Temp 97.7 F (36.5 C) (Oral)   Resp 16   Ht 5' 6.5" (1.689 m)   Wt 195 lb 9.6 oz (88.7 kg)   LMP 04/29/2012   SpO2 97%   BMI 31.10 kg/m   Wt Readings from Last 3 Encounters:  11/14/17 195 lb 9.6 oz (88.7 kg)  11/06/17 180 lb (81.6 kg)  10/17/17 199 lb 9.6 oz (90.5 kg)    Physical Exam General appearance - alert, well appearing, middle-aged Caucasian female and in no distress Mental status - normal mood, behavior, speech, dress, motor activity, and  thought processes Chest - clear to auscultation, no wheezes, rales or rhonchi, symmetric air entry Heart - normal rate, regular rhythm, normal S1, S2, no murmurs, rubs, clicks  or gallops Extremities - peripheral pulses normal, no pedal edema, no clubbing or cyanosis  ASSESSMENT AND PLAN: 1. Mixed hyperlipidemia - omega-3 acid ethyl esters (LOVAZA) 1 g capsule; Take 1 capsule (1 g total) by mouth 2 (two) times daily. For high cholesterol  Dispense: 180 capsule; Refill: 1  2. Gastroesophageal reflux disease without esophagitis - omeprazole (PRILOSEC) 20 MG capsule; Take 1 capsule (20 mg total) by mouth daily.  Dispense: 90 capsule; Refill: 1  3. Need for dental care - Ambulatory referral to Dentistry  4. Colon cancer screening - Ambulatory referral to Gastroenterology  I discussed breast cancer screening and strongly advised that she gets mammogram done given her family history of breast cancer.  However patient is adamant at this time that she does not wish to have screening done. Patient was given the opportunity to ask questions.  Patient verbalized understanding of the plan and was able to repeat key elements of the plan.   No orders of the defined types were placed in this encounter.    Requested Prescriptions    No prescriptions requested or ordered in this encounter    No follow-ups on file.  Karle Plumber, MD, FACP

## 2017-11-16 ENCOUNTER — Encounter: Payer: Self-pay | Admitting: Gastroenterology

## 2017-11-20 ENCOUNTER — Ambulatory Visit: Payer: Self-pay | Admitting: Nurse Practitioner

## 2017-11-27 ENCOUNTER — Ambulatory Visit (INDEPENDENT_AMBULATORY_CARE_PROVIDER_SITE_OTHER): Payer: Self-pay | Admitting: Nurse Practitioner

## 2017-11-28 ENCOUNTER — Other Ambulatory Visit (HOSPITAL_COMMUNITY): Payer: Self-pay

## 2017-11-28 DIAGNOSIS — F5105 Insomnia due to other mental disorder: Secondary | ICD-10-CM

## 2017-11-28 DIAGNOSIS — F99 Mental disorder, not otherwise specified: Principal | ICD-10-CM

## 2017-11-28 MED ORDER — TRAZODONE HCL 50 MG PO TABS: 150.0000 mg | ORAL_TABLET | Freq: Every evening | ORAL | 0 refills | Status: DC | PRN

## 2017-11-28 MED FILL — traZODone HCL 50 MG TABS: 50 | 10 days supply | Qty: 30 | Fill #0

## 2017-11-28 NOTE — Progress Notes (Signed)
Sent in 30 tabs to the pharmacy until patients appointment on 12-04-17, per Reg[place]

## 2017-12-04 ENCOUNTER — Encounter (HOSPITAL_COMMUNITY): Payer: Self-pay | Admitting: Psychiatry

## 2017-12-04 ENCOUNTER — Ambulatory Visit (INDEPENDENT_AMBULATORY_CARE_PROVIDER_SITE_OTHER): Payer: No Typology Code available for payment source | Admitting: Psychiatry

## 2017-12-04 VITALS — BP 128/80 | HR 81 | Ht 66.5 in | Wt 206.0 lb

## 2017-12-04 DIAGNOSIS — Z62810 Personal history of physical and sexual abuse in childhood: Secondary | ICD-10-CM

## 2017-12-04 DIAGNOSIS — F17210 Nicotine dependence, cigarettes, uncomplicated: Secondary | ICD-10-CM

## 2017-12-04 DIAGNOSIS — Z6229 Other upbringing away from parents: Secondary | ICD-10-CM

## 2017-12-04 DIAGNOSIS — Z560 Unemployment, unspecified: Secondary | ICD-10-CM

## 2017-12-04 DIAGNOSIS — Z62811 Personal history of psychological abuse in childhood: Secondary | ICD-10-CM

## 2017-12-04 DIAGNOSIS — F319 Bipolar disorder, unspecified: Secondary | ICD-10-CM

## 2017-12-04 DIAGNOSIS — F4310 Post-traumatic stress disorder, unspecified: Secondary | ICD-10-CM

## 2017-12-04 DIAGNOSIS — F1910 Other psychoactive substance abuse, uncomplicated: Secondary | ICD-10-CM

## 2017-12-04 DIAGNOSIS — F99 Mental disorder, not otherwise specified: Secondary | ICD-10-CM

## 2017-12-04 DIAGNOSIS — F411 Generalized anxiety disorder: Secondary | ICD-10-CM

## 2017-12-04 DIAGNOSIS — F5105 Insomnia due to other mental disorder: Secondary | ICD-10-CM

## 2017-12-04 DIAGNOSIS — M2550 Pain in unspecified joint: Secondary | ICD-10-CM

## 2017-12-04 DIAGNOSIS — Z6379 Other stressful life events affecting family and household: Secondary | ICD-10-CM

## 2017-12-04 DIAGNOSIS — R450 Nervousness: Secondary | ICD-10-CM

## 2017-12-04 MED ORDER — GABAPENTIN 300 MG PO CAPS: 300.0000 mg | ORAL_CAPSULE | Freq: Three times a day (TID) | ORAL | 1 refills | Status: DC

## 2017-12-04 MED ORDER — TRAZODONE HCL 100 MG PO TABS: 200.0000 mg | ORAL_TABLET | Freq: Every evening | ORAL | 1 refills | Status: DC | PRN

## 2017-12-04 MED ORDER — HYDROXYZINE HCL 50 MG PO TABS: 50.0000 mg | ORAL_TABLET | Freq: Three times a day (TID) | ORAL | 1 refills | Status: DC | PRN

## 2017-12-04 MED ORDER — LAMOTRIGINE 100 MG PO TABS: 300.0000 mg | ORAL_TABLET | Freq: Every day | ORAL | 1 refills | Status: DC

## 2017-12-04 MED FILL — traZODone HCL 100 MG TABS: 100 | 30 days supply | Qty: 60 | Fill #0

## 2017-12-04 MED FILL — lamoTRIgine 100 MG TABS: 100 | 30 days supply | Qty: 90 | Fill #0

## 2017-12-04 MED FILL — hydrOXYzine HCL 50 MG TABS: 50 | 10 days supply | Qty: 30 | Fill #0
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
## 2017-12-06 ENCOUNTER — Other Ambulatory Visit (HOSPITAL_COMMUNITY): Payer: Self-pay

## 2017-12-06 DIAGNOSIS — F5105 Insomnia due to other mental disorder: Secondary | ICD-10-CM

## 2017-12-06 DIAGNOSIS — F99 Mental disorder, not otherwise specified: Principal | ICD-10-CM

## 2017-12-06 MED ORDER — TRAZODONE HCL 50 MG PO TABS
200.0000 mg | ORAL_TABLET | Freq: Every evening | ORAL | 0 refills | Status: DC | PRN
Start: 2017-12-06 — End: 2018-01-02

## 2017-12-06 MED FILL — traZODone HCL 50 MG TABS: 50 | 30 days supply | Qty: 120 | Fill #0

## 2017-12-13 MED FILL — OMEPRAZOLE 20 MG CAP: 20 | 30 days supply | Qty: 30 | Fill #1

## 2017-12-13 MED FILL — PRAVASTATIN SODIUM 80 MG TA: 80 | 30 days supply | Qty: 30 | Fill #4

## 2017-12-14 ENCOUNTER — Encounter: Payer: Self-pay | Admitting: Family Medicine

## 2017-12-14 ENCOUNTER — Ambulatory Visit (INDEPENDENT_AMBULATORY_CARE_PROVIDER_SITE_OTHER): Payer: Self-pay | Admitting: Family Medicine

## 2017-12-14 VITALS — BP 124/82 | Ht 66.5 in | Wt 200.0 lb

## 2017-12-14 DIAGNOSIS — G8929 Other chronic pain: Secondary | ICD-10-CM

## 2017-12-14 DIAGNOSIS — M25551 Pain in right hip: Secondary | ICD-10-CM

## 2017-12-14 DIAGNOSIS — R600 Localized edema: Secondary | ICD-10-CM

## 2017-12-14 NOTE — Progress Notes (Signed)
Chief complaint: Follow-up of chronic right lateral hip pain x18 months, new problem of intermittent lower extremity edema  History of present illness: Katie Sherman is a 52 year old female presents to sports medicine office today for follow-up of chronic right lateral hip pain.  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  She has had multiple injections in the past in her greater trochanter bursal region as well as formal physical therapy, with fleeting improvement in her symptoms.  She did have a MRI of her right hip back in March which showed right gluteus tendinosis, with no labral abnormality noted otherwise.  There was no significant hip joint effusion or any significant greater trochanteric bursal effusion.  She is currently on nitroglycerin protocol for the right greater trochanteric pain syndrome.  When she was last here a few months ago, she did have acute knee pain and swelling.  Aspiration followed by cortisone injection was done in her right knee, which fortunately did provide her relief.  She does not report of any knee pain today.  She reports of continued pain in her right hip with prolonged standing and walking.  She does not report of any interval injury or trauma.  She does report of slight interval improvement in symptoms following the right knee aspiration and cortisone injection.  Unfortunately, given her financial situation she cannot continually to go to formal physical therapy.  She has, admittedly, been inconsistently doing home exercise program at home.  She describes the pain today as a throbbing, aching, and occasionally sharp pain.  She points to the lateral aspect of her right hip and upper leg as to where she feels the pain.  Also today, she wants to discuss having intermittent bilateral lower extremity edema for the last 4 weeks.  She does not report of any specific inciting incident, trauma, or injury to explain the pain.   She reports that she will notice symptoms of swelling near the end the day after standing at work.  She does not report of any recent changes in shoewear.  She does not report of any ankle instability.  She does not report of any chest pain, palpitations, shortness of breath, or dyspnea at rest or with exertion.  She has a history of extremity edema.  She does not report of any recent long travel, immobilization, or personal/family history of VTE.   Review of systems:  As stated above   Interval past medical history, surgical history, family history, and social history obtained and unchanged. Her past medical history is notable for insomnia  and bipolar disorder; surgical history notable for cervical cone biopsy and tubal ligation; she does report a current tobacco use on daily basis, 1 pack per day for the last 36 years; family history notable for CAD and breast cancer; allergies and medications are reviewed and are reflected in EMR.   Physical exam: Vital signs are reviewed and are documented in the chart Gen.: Alert, oriented, appears stated age, in no apparent distress HEENT: Moist oral mucosa Respiratory: Normal respirations, able to speak in full sentences Cardiac: Regular rate, distal pulses 2+ Integumentary: No rashes on visible skin:  Neurologic: She does have slight weakness with hip abduction, hip external range of motion, and hip extension on the right side, would categorize strength as 4+/5, otherwise strength is intact with hip flexion, hip adduction, knee flexion, knee extension, sensation 2+ in bilateral lower extremities Psych: Normal affect, mood is described as good Musculoskeletal: Inspection of her right  hip reveals no obvious deformity or muscle atrophy, no warmth, erythema, ecchymosis, or effusion, she is tender palpation of multiple areas along the right gluteus medius, right gluteus minimus, piriformis, tensor fascia lata, and proximal IT band, she does report of pain with  extremes of hip flexion, hip extension, and hip external range of motion, she does have pain with FABER, no pain with straight leg, FADIR and logroll, no evidence of lower extremity edema, no calf tenderness or swelling, she does not walk with an antalgic gait  Assessment and plan: 1.  Chronic right lateral hip pain, with clinical symptoms suggestive of right greater trochanteric pain syndrome, with MRI evidence of right gluteus medius tendinopathy with no other findings 2.  Suspect intermittent dependent lower extremity edema 3.  History of acute right knee pain with effusion, status post aspiration with cortisone injection back on 11/06/2017, doing much better today 4.  History of left shoulder adhesive capsulitis, asymptomatic today  Plan: Discussed with Katie Sherman today that next best step in regards to her hip pain would be to have her go back into formal physical therapy.  Discussed that MRI does not show any type of labral tear or any other abnormality other than gluteus medius tendinopathy.  Discussed that this is not a surgical intervention.  Discussed the mainstay will be physical therapy and strengthening.  She is agreeable to a few more sessions of formal physical therapy.  Discussed to continue the nitroglycerin patch.  In regards to intermittent bilateral lower extremity edema, discussed that symptoms seem to be consistent with dependent edema.  I do not see any evidence of edema on examination today.  No evidence to suggest VTE.  Discussed to have her wear compression stockings.  Did provide DME order to have her fitted for 15 mmHg compression stockings.  She will follow-up in 4 weeks if she does not have any interval improvement in symptoms.  Otherwise, will have her follow-up on as-needed basis.   Mort Sawyers, M.D. [facility]

## 2017-12-18 MED FILL — NITROGLYCERIN 0.2 MG/HR PAT: 0.2 | 28 days supply | Qty: 7 | Fill #1

## 2018-01-02 ENCOUNTER — Other Ambulatory Visit (HOSPITAL_COMMUNITY): Payer: Self-pay

## 2018-01-02 DIAGNOSIS — F99 Mental disorder, not otherwise specified: Principal | ICD-10-CM

## 2018-01-02 DIAGNOSIS — F5105 Insomnia due to other mental disorder: Secondary | ICD-10-CM

## 2018-01-02 MED ORDER — TRAZODONE HCL 50 MG PO TABS: 200.0000 mg | ORAL_TABLET | Freq: Every evening | ORAL | 0 refills | Status: DC | PRN

## 2018-01-02 MED FILL — lamoTRIgine 100 MG TABS: 100 | 30 days supply | Qty: 90 | Fill #1

## 2018-01-02 MED FILL — GABAPENTIN 300 MG CAPSULE: 300 | 30 days supply | Qty: 90 | Fill #0

## 2018-01-02 MED FILL — hydrOXYzine HCL 50 MG TABS: 50 | 10 days supply | Qty: 30 | Fill #1

## 2018-01-02 MED FILL — traZODone HCL 50 MG TABS: 50 | 30 days supply | Qty: 120 | Fill #0

## 2018-01-07 ENCOUNTER — Ambulatory Visit: Payer: No Typology Code available for payment source

## 2018-01-08 MED FILL — OMEPRAZOLE 20 MG CAP: 20 | 30 days supply | Qty: 30 | Fill #2

## 2018-01-11 ENCOUNTER — Ambulatory Visit: Payer: No Typology Code available for payment source | Attending: Internal Medicine

## 2018-01-11 MED FILL — NITROGLYCERIN 0.2 MG/HR PAT: 0.2 | 28 days supply | Qty: 7 | Fill #2

## 2018-01-15 ENCOUNTER — Encounter: Payer: Self-pay | Admitting: Internal Medicine

## 2018-01-15 ENCOUNTER — Ambulatory Visit: Payer: No Typology Code available for payment source | Attending: Internal Medicine | Admitting: Internal Medicine

## 2018-01-15 VITALS — BP 114/74 | HR 74 | Temp 98.0°F | Resp 16 | Wt 204.6 lb

## 2018-01-15 DIAGNOSIS — K219 Gastro-esophageal reflux disease without esophagitis: Secondary | ICD-10-CM | POA: Insufficient documentation

## 2018-01-15 DIAGNOSIS — Z79899 Other long term (current) drug therapy: Secondary | ICD-10-CM | POA: Insufficient documentation

## 2018-01-15 DIAGNOSIS — Z8249 Family history of ischemic heart disease and other diseases of the circulatory system: Secondary | ICD-10-CM | POA: Insufficient documentation

## 2018-01-15 DIAGNOSIS — M62830 Muscle spasm of back: Secondary | ICD-10-CM | POA: Insufficient documentation

## 2018-01-15 DIAGNOSIS — M545 Low back pain, unspecified: Secondary | ICD-10-CM

## 2018-01-15 DIAGNOSIS — F319 Bipolar disorder, unspecified: Secondary | ICD-10-CM | POA: Insufficient documentation

## 2018-01-15 DIAGNOSIS — E782 Mixed hyperlipidemia: Secondary | ICD-10-CM | POA: Insufficient documentation

## 2018-01-15 DIAGNOSIS — G5603 Carpal tunnel syndrome, bilateral upper limbs: Secondary | ICD-10-CM | POA: Insufficient documentation

## 2018-01-15 DIAGNOSIS — R200 Anesthesia of skin: Secondary | ICD-10-CM

## 2018-01-15 DIAGNOSIS — F17210 Nicotine dependence, cigarettes, uncomplicated: Secondary | ICD-10-CM | POA: Insufficient documentation

## 2018-01-15 DIAGNOSIS — M25551 Pain in right hip: Secondary | ICD-10-CM | POA: Insufficient documentation

## 2018-01-15 DIAGNOSIS — M25512 Pain in left shoulder: Secondary | ICD-10-CM | POA: Insufficient documentation

## 2018-01-15 DIAGNOSIS — Z803 Family history of malignant neoplasm of breast: Secondary | ICD-10-CM | POA: Insufficient documentation

## 2018-01-15 DIAGNOSIS — R202 Paresthesia of skin: Secondary | ICD-10-CM

## 2018-01-15 DIAGNOSIS — Z1211 Encounter for screening for malignant neoplasm of colon: Secondary | ICD-10-CM | POA: Insufficient documentation

## 2018-01-15 MED ORDER — CYCLOBENZAPRINE HCL 10 MG PO TABS: 10.0000 mg | ORAL_TABLET | Freq: Every day | ORAL | 3 refills | Status: DC | PRN

## 2018-01-15 MED FILL — CYCLOBENZAPRINE 10 MG TAB: 10 | 30 days supply | Qty: 30 | Fill #0

## 2018-01-15 NOTE — Progress Notes (Signed)
Patient ID: Katie Sherman, female    DOB: 01-Aug-1965  MRN: [id number]  CC: Carpal Tunnel and Hip Pain   Subjective: Katie Sherman is a 52 y.o. female who presents for chronic disease management Her concerns today include:  Pt with hx of GERD, HL, bipolar 1, chronic LT shoulder and Rt hip pain.    Colon cancer screening: Referred for colonoscopy with the orange card.  She has an appointment in [place] later this month for it.  However she reports that transportation is an issue.    Complains of numbness and tingling in both hands.  Reports being diagnosed with carpal tunnel syndrome back in 2015 and was given a cock-up wrist splint which she used for a while with resolution of symptoms.  However symptoms restarted again about 3 weeks ago and she feels that the splint is not helping.  She also started having symptoms in the right hand.  Symptoms are worse in the mornings.  She is requesting refill on Flexeril. She reports that it helps with her right hip pain.  She has gluteus tendinopathy for which she sees sports medicine.  She is on nitro patch for that which she finds helpful.  She also gets a lot of spasms in the lower back because of the problem that she has with the right hip.  Patient Active Problem List   Diagnosis Date Noted  . Mixed hyperlipidemia 11/14/2017  . 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 Postmenopausal 01/22/2017  . Bipolar 1 disorder, depressed ([place]) 12/29/2016  . Family history of suicide 11/20/2016  . Depression 11/20/2016  . Family history of breast cancer 11/20/2016  . Adhesive capsulitis of left shoulder 11/19/2016  . Greater trochanteric bursitis of right hip 11/19/2016     Current Outpatient Medications on File Prior to Visit  Medication Sig Dispense Refill  . gabapentin (NEURONTIN) 300 MG capsule Take 1 capsule (300 mg total) by mouth 3 (three) times daily. 90 capsule 1  . hydrOXYzine (ATARAX/VISTARIL) 50 MG tablet Take 1 tablet (50  mg total) by mouth 3 (three) times daily as needed. 30 tablet 1  . lamoTRIgine (LAMICTAL) 100 MG tablet Take 3 tablets (300 mg total) by mouth daily. 90 tablet 1  . nitroGLYCERIN (NITRODUR - DOSED IN MG/24 HR) 0.2 mg/hr patch Use 1/4 patch daily to the affected area 30 patch 1  . omega-3 acid ethyl esters (LOVAZA) 1 g capsule Take 1 capsule (1 g total) by mouth 2 (two) times daily. For high cholesterol 180 capsule 1  . omeprazole (PRILOSEC) 20 MG capsule Take 1 capsule (20 mg total) by mouth daily. 90 capsule 1  . pravastatin (PRAVACHOL) 80 MG tablet Take 1 tablet (80 mg total) by mouth daily at 6 PM. For high cholesterol 30 tablet 5  . traZODone (DESYREL) 50 MG tablet Take 4 tablets (200 mg total) by mouth at bedtime as needed for sleep. 120 tablet 0   No current facility-administered medications on file prior to visit.     No Active Allergies  Social History   Socioeconomic History  . Marital status: Widowed    Spouse name: Not on file  . Number of children: Not on file  . Years of education: Not on file  . Highest education level: Not on file  Occupational History  . Occupation: unemployed  Social Needs  . Financial resource strain: Not on file  . Food insecurity:    Worry: Not on file    Inability: Not on  file  . Transportation needs:    Medical: Not on file    Non-medical: Not on file  Tobacco Use  . Smoking status: Current Every Day Smoker    Packs/day: 1.00    Years: 36.00    Pack years: 36.00    Types: Cigarettes    Start date: 38  . Smokeless tobacco: Never Used  . Tobacco comment: 3 months without  Substance and Sexual Activity  . Alcohol use: No  . Drug use: No    Types: Marijuana    Comment: stopped 04/06/2017  . Sexual activity: Not Currently    Birth control/protection: None  Lifestyle  . Physical activity:    Days per week: Not on file    Minutes per session: Not on file  . Stress: Not on file  Relationships  . Social connections:    Talks on  phone: Not on file    Gets together: Not on file    Attends religious service: Not on file    Active member of club or organization: Not on file    Attends meetings of clubs or organizations: Not on file    Relationship status: Not on file  . Intimate partner violence:    Fear of current or ex partner: Not on file    Emotionally abused: Not on file    Physically abused: Not on file    Forced sexual activity: Not on file  Other Topics Concern  . Not on file  Social History Narrative   Prior traffic control specialist - unemployed now   Widow   5 children, 10 grandchildren   Moved here from [place] 2013    Family History  Problem Relation Age of Onset  . Heart attack Mother 65  . Heart disease Father   . Heart attack Father 76    Past Surgical History:  Procedure Laterality Date  . CERVICAL CONE BIOPSY    . TUBAL LIGATION      ROS: Review of Systems Negative except as stated above PHYSICAL EXAM: BP 114/74   Pulse 74   Temp 98 F (36.7 C) (Oral)   Resp 16   Wt 204 lb 9.6 oz (92.8 kg)   LMP 04/29/2012   SpO2 95%   BMI 32.53 kg/m   Physical Exam  General appearance - alert, well appearing, and in no distress Mental status - normal mood, behavior, speech, dress, motor activity, and thought processes Chest - clear to auscultation, no wheezes, rales or rhonchi, symmetric air entry Heart - normal rate, regular rhythm, normal S1, S2, no murmurs, rubs, clicks or gallops Musculoskeletal -hands: No signs of active inflammation.  No wasting of intrinsic hand muscles.  Grip is 5/5 bilaterally.  Tinel sign negative bilaterally. Extremities - peripheral pulses normal, no pedal edema, no clubbing or cyanosis  ASSESSMENT AND PLAN: 1. Colon cancer screening Since patient unable to get to [facility] for GI appointment, discuss other options of colon cancer screening with her including FIT.  She is willing to do the fit test.  We will go ahead and cancel the GI appointment.   However patient is aware that if the fit test is positive she will need to have a colonoscopy - Fecal occult blood, imunochemical([facility])  2. Spasm of muscle of lower back - cyclobenzaprine (FLEXERIL) 10 MG tablet; Take 1 tablet (10 mg total) by mouth daily as needed for muscle spasms.  Dispense: 30 tablet; Refill: 3  3. Bilateral carpal tunnel syndrome 4. Numbness and tingling in  both hands -We will have nurse give her a wrist splint for the right hand since she already has one for the left.  If no improvement in symptoms we can refer for EMG and then to orthopedics - Vitamin B12 - TSH   Patient was given the opportunity to ask questions.  Patient verbalized understanding of the plan and was able to repeat key elements of the plan.   Orders Placed This Encounter  Procedures  . Fecal occult blood, imunochemical([facility])  . Vitamin B12  . TSH     Requested Prescriptions   Signed Prescriptions Disp Refills  . cyclobenzaprine (FLEXERIL) 10 MG tablet 30 tablet 3    Sig: Take 1 tablet (10 mg total) by mouth daily as needed for muscle spasms.    Return in about 4 months (around 05/18/2018).  Karle Plumber, MD, FACP

## 2018-01-16 LAB — VITAMIN B12: Vitamin B-12: 682 pg/mL (ref 232–1245)

## 2018-01-16 LAB — TSH: TSH: 1.52 u[IU]/mL (ref 0.450–4.500)

## 2018-01-22 ENCOUNTER — Other Ambulatory Visit (HOSPITAL_COMMUNITY): Payer: Self-pay

## 2018-01-22 DIAGNOSIS — F5105 Insomnia due to other mental disorder: Secondary | ICD-10-CM

## 2018-01-22 DIAGNOSIS — F319 Bipolar disorder, unspecified: Secondary | ICD-10-CM

## 2018-01-22 DIAGNOSIS — F99 Mental disorder, not otherwise specified: Secondary | ICD-10-CM

## 2018-01-22 MED ORDER — HYDROXYZINE HCL 50 MG PO TABS: 50.0000 mg | ORAL_TABLET | Freq: Three times a day (TID) | ORAL | 1 refills | Status: DC | PRN

## 2018-01-22 MED ORDER — TRAZODONE HCL 50 MG PO TABS: 200.0000 mg | ORAL_TABLET | Freq: Every evening | ORAL | 0 refills | Status: DC | PRN

## 2018-01-22 MED ORDER — GABAPENTIN 300 MG PO CAPS: 300.0000 mg | ORAL_CAPSULE | Freq: Three times a day (TID) | ORAL | 0 refills | Status: DC

## 2018-01-22 MED ORDER — LAMOTRIGINE 100 MG PO TABS: 300.0000 mg | ORAL_TABLET | Freq: Every day | ORAL | 0 refills | Status: DC

## 2018-01-25 ENCOUNTER — Ambulatory Visit: Payer: No Typology Code available for payment source | Admitting: Gastroenterology

## 2018-01-30 MED FILL — traZODone HCL 50 MG TABS: 50 | 30 days supply | Qty: 120 | Fill #0

## 2018-01-30 MED FILL — GABAPENTIN 300 MG CAPSULE: 300 | 30 days supply | Qty: 90 | Fill #0

## 2018-01-30 MED FILL — hydrOXYzine HCL 50 MG TABS: 50 | 30 days supply | Qty: 90 | Fill #0

## 2018-01-30 MED FILL — lamoTRIgine 100 MG TABS: 100 | 30 days supply | Qty: 90 | Fill #0

## 2018-02-05 ENCOUNTER — Ambulatory Visit (HOSPITAL_COMMUNITY): Payer: Self-pay | Admitting: Psychiatry

## 2018-02-08 MED FILL — ?OMEPRazole 20mg CPDR: 20 | 30 days supply | Qty: 30 | Fill #3

## 2018-02-08 MED FILL — PRAVASTATIN SODIUM 80 MG TA: 80 | 30 days supply | Qty: 30 | Fill #5

## 2018-02-12 ENCOUNTER — Ambulatory Visit (HOSPITAL_COMMUNITY): Payer: No Typology Code available for payment source | Admitting: Psychiatry

## 2018-02-14 ENCOUNTER — Encounter: Payer: Self-pay | Admitting: Sports Medicine

## 2018-02-14 ENCOUNTER — Ambulatory Visit (INDEPENDENT_AMBULATORY_CARE_PROVIDER_SITE_OTHER): Payer: Self-pay | Admitting: Sports Medicine

## 2018-02-14 VITALS — BP 118/82 | Ht 66.5 in | Wt 200.0 lb

## 2018-02-14 DIAGNOSIS — G5603 Carpal tunnel syndrome, bilateral upper limbs: Secondary | ICD-10-CM

## 2018-02-14 MED FILL — CYCLOBENZAPRINE 10 MG TAB: 10 | 30 days supply | Qty: 30 | Fill #1

## 2018-02-14 NOTE — Patient Instructions (Signed)
You have symptoms that are consistent with bilateral carpal tunnel syndrome. For now, continue to wear the wrist braces at night. We will refer you for a nerve conduction study test that will allow [place] to see the severity of the carpal tunnel syndrome. After completing the nerve conduction study, schedule follow-up appointment back here at the sports medicine clinic to review the results and further treatment.

## 2018-02-14 NOTE — Progress Notes (Signed)
Katie Sherman - 52 y.o. female MRN [id number]  Date of birth: 1965-09-15    SUBJECTIVE:      Chief Complaint:/ HPI:  Katie Sherman is a 52 year old female who presents with bilateral wrist pain.  Patient dates the onset of her pain in the left wrist to 2015 she says she was diagnosed with carpal tunnel syndrome.  At that time she wore a wrist brace for a few months and eventually had resolution of her symptoms for about 4 years.  Now about 6 to 8 weeks ago her symptoms have returned and are worse this time.  Shortly after she also developed similar symptoms in the right hand but these are less severe.  The patient describes mostly nighttime burning and some paresthesias in the bilateral thumb, index, and middle fingers.  Her symptoms are most noticeable in the index and middle finger.  She sometimes also has some symptoms into the ring finger.  She has been wearing nighttime wrist braces on both wrists with minimal benefit. She denies any weakness in the hands.  Denies dropping any objects.  She denies any localized swelling, erythema, or bruising.   ROS:     See HPI  PERTINENT  PMH / PSH FH / / SH:  Past Medical, Surgical, Social, and Family History Reviewed & Updated in the EMR.  Pertinent findings include:  Patient's past medical history significant for bipolar 1 disorder with depression.  Prior to presentation today she is already taking gabapentin 300 mg 3 times daily.  OBJECTIVE: BP 118/82   Ht 5' 6.5" (1.689 m)   Wt 200 lb (90.7 kg)   LMP 04/29/2012   BMI 31.80 kg/m   Physical Exam:  Vital signs are reviewed.  GEN: Alert and oriented, NAD Pulm: Breathing unlabored PSY: normal mood, congruent affect  MSK: Right wrist/hand: Inspection: No obvious deformity.  No swelling, bruising, ecchymosis Palpation: No tenderness to palpation of the wrist or fingers ROM: Full range of motion of the elbow wrist and fingers Strength: 5/5 strength in wrist flexion, extension, radial deviation and  ulnar deviation.  Full strength of the intrinsic hand muscles Neurovascular: N/V intact Special tests: Negative Tinel's at the carpal tunnel, cubital tunnel, and Guyon's canal.  Her symptoms of sensation of burning and tingling are reproduced with reverse Phalen's and to a lesser extent with Phalen's testing.  Left hand/wrist:`   Inspection: No obvious deformity.  No swelling, bruising, ecchymosis Palpation: No tenderness to palpation of the wrist or fingers ROM: Full range of motion of the elbow wrist and fingers Strength: 5/5 strength in wrist flexion, extension, radial deviation and ulnar deviation.  Full strength of the intrinsic hand muscles Neurovascular: N/V intact Special tests: Negative Tinel's at the carpal tunnel, cubital tunnel, and Guyon's canal.  Her symptoms of sensation of burning and tingling are reproduced with reverse Phalen's and to a lesser extent with Phalen's testing.  Her symptoms are more pronounced with the testing of the left hand.      ASSESSMENT & PLAN:  1. Bilateral carpal tunnel syndrome: Patient history and physical exam today sounds consistent with carpal tunnel syndrome. -She should continue to wear her wrist braces at night.  She is provided with a more appropriate wrist brace for the left wrist compared to what she was wearing as this one will provide more wrist extension. - Referred for nerve conduction study/EMG to better characterize the severity of her couple tunnel syndrome. - She will return to the clinic following her nerve conduction study.  Consider steroid injections at that time.  Consider Ortho referral if she has severe carpal tunnel syndrome

## 2018-02-15 ENCOUNTER — Telehealth: Payer: Self-pay

## 2018-02-15 NOTE — Telephone Encounter (Signed)
Told pt we can try and get her in to [place] for B/L nerve conduction studies/EMGs. With her insurance we are limited to where she can go. Dr. Okey Dupre explained he will not inject her until after we get the test results. Pt understands.

## 2018-02-19 ENCOUNTER — Ambulatory Visit: Payer: Self-pay | Attending: Internal Medicine

## 2018-03-04 ENCOUNTER — Other Ambulatory Visit (HOSPITAL_COMMUNITY): Payer: Self-pay

## 2018-03-04 DIAGNOSIS — F99 Mental disorder, not otherwise specified: Secondary | ICD-10-CM

## 2018-03-04 DIAGNOSIS — F5105 Insomnia due to other mental disorder: Secondary | ICD-10-CM

## 2018-03-04 DIAGNOSIS — F319 Bipolar disorder, unspecified: Secondary | ICD-10-CM

## 2018-03-04 MED ORDER — GABAPENTIN 300 MG PO CAPS: 300.0000 mg | ORAL_CAPSULE | Freq: Three times a day (TID) | ORAL | 0 refills | Status: DC

## 2018-03-04 MED ORDER — LAMOTRIGINE 100 MG PO TABS: 300.0000 mg | ORAL_TABLET | Freq: Every day | ORAL | 0 refills | Status: DC

## 2018-03-04 MED ORDER — HYDROXYZINE HCL 50 MG PO TABS: 50.0000 mg | ORAL_TABLET | Freq: Three times a day (TID) | ORAL | 1 refills | Status: DC | PRN

## 2018-03-04 MED ORDER — TRAZODONE HCL 50 MG PO TABS: 200.0000 mg | ORAL_TABLET | Freq: Every evening | ORAL | 0 refills | Status: DC | PRN

## 2018-03-04 MED FILL — hydrOXYzine HCL 50 MG TABS: 50 | 30 days supply | Qty: 90 | Fill #0

## 2018-03-04 MED FILL — GABAPENTIN 300 MG CAPSULE: 300 | 30 days supply | Qty: 90 | Fill #0

## 2018-03-04 MED FILL — traZODone HCL 50 MG TABS: 50 | 30 days supply | Qty: 120 | Fill #0

## 2018-03-04 MED FILL — lamoTRIgine 100 MG TABS: 100 | 30 days supply | Qty: 90 | Fill #0

## 2018-03-12 MED FILL — ?OMEPRazole 20mg CPDR: 20 | 30 days supply | Qty: 30 | Fill #4

## 2018-03-12 MED FILL — CYCLOBENZAPRINE 10 MG TAB: 10 | 30 days supply | Qty: 30 | Fill #2

## 2018-03-13 ENCOUNTER — Telehealth: Payer: Self-pay | Admitting: Internal Medicine

## 2018-03-13 NOTE — Telephone Encounter (Signed)
Patient called for confirmation regarding a fax sent to you please follow up with patient.

## 2018-03-19 ENCOUNTER — Encounter: Payer: Self-pay | Admitting: Neurology

## 2018-03-19 ENCOUNTER — Ambulatory Visit (INDEPENDENT_AMBULATORY_CARE_PROVIDER_SITE_OTHER): Payer: Self-pay | Admitting: Neurology

## 2018-03-19 ENCOUNTER — Encounter

## 2018-03-19 DIAGNOSIS — G5603 Carpal tunnel syndrome, bilateral upper limbs: Secondary | ICD-10-CM

## 2018-03-19 DIAGNOSIS — G5602 Carpal tunnel syndrome, left upper limb: Secondary | ICD-10-CM | POA: Insufficient documentation

## 2018-03-19 HISTORY — DX: Carpal tunnel syndrome, bilateral upper limbs: G56.03

## 2018-03-19 NOTE — Procedures (Signed)
     HISTORY:  Katie Sherman is a 52 year old patient with a history of some numbness and discomfort in the left hand and wrist since 2015 with onset of similar symptoms on the right hand and wrist over the last 2 months.  The patient denies neck pain or pain down the arms.  She is being evaluated for a possible neuropathy or a cervical radiculopathy.  NERVE CONDUCTION STUDIES:  Nerve conduction studies were performed on both upper extremities.  The distal motor latencies for the median nerves were prolonged bilaterally with normal motor amplitudes for these nerves bilaterally.  The distal motor latencies and motor amplitudes for the ulnar nerves were normal bilaterally.  The nerve conduction velocities for the median and ulnar nerves were normal bilaterally.  The sensory latencies for the median nerves were prolonged bilaterally, normal for the ulnar nerves bilaterally.  The F-wave latencies for the ulnar nerves were normal bilaterally.  EMG STUDIES:  EMG study was performed on the left upper extremity:  The first dorsal interosseous muscle reveals 2 to 4 K units with full recruitment. No fibrillations or positive waves were noted. The abductor pollicis brevis muscle reveals 2 to 4 K units with full recruitment. No fibrillations or positive waves were noted. The extensor indicis proprius muscle reveals 1 to 3 K units with full recruitment. No fibrillations or positive waves were noted. The pronator teres muscle reveals 2 to 3 K units with full recruitment. No fibrillations or positive waves were noted. The biceps muscle reveals 1 to 2 K units with full recruitment. No fibrillations or positive waves were noted. The triceps muscle reveals 2 to 4 K units with full recruitment. No fibrillations or positive waves were noted. The anterior deltoid muscle reveals 2 to 3 K units with full recruitment. No fibrillations or positive waves were noted. The cervical paraspinal muscles were tested at 2  levels. No abnormalities of insertional activity were seen at either level tested. There was good relaxation.   IMPRESSION:  Nerve conduction studies done on both upper extremities show evidence of mild bilateral carpal tunnel syndrome.  EMG evaluation of the left upper extremity was unremarkable without evidence of an overlying cervical radiculopathy.  Jill Alexanders MD 03/19/2018 3:55 PM  [facility] [address] [place], [place] [address]  Phone [phone] Fax [phone]

## 2018-03-19 NOTE — Progress Notes (Signed)
Please refer to EMG and nerve conduction study procedure note. 

## 2018-03-19 NOTE — Progress Notes (Signed)
[facility]    Nerve / Sites Muscle Latency Ref. Amplitude Ref. Rel Amp Segments Distance Velocity Ref. Area    ms ms mV mV %  cm m/s m/s mVms  L Median - APB     Wrist APB 5.1 ?4.4 4.1 ?4.0 100 Wrist - APB 7   14.2     Upper arm APB 8.2  4.4  105 Upper arm - Wrist 19 61 ?49 17.0  R Median - APB     Wrist APB 4.6 ?4.4 6.5 ?4.0 100 Wrist - APB 7   22.1     Upper arm APB 8.3  6.2  95.4 Upper arm - Wrist 20 53 ?49 20.6  L Ulnar - ADM     Wrist ADM 2.7 ?3.3 8.4 ?6.0 100 Wrist - ADM 7   22.6     B.Elbow ADM 6.2  7.2  86.2 B.Elbow - Wrist 20 56 ?49 20.2     A.Elbow ADM 7.9  7.1  98.8 A.Elbow - B.Elbow 10 58 ?49 19.6         A.Elbow - Wrist      R Ulnar - ADM     Wrist ADM 2.6 ?3.3 8.9 ?6.0 100 Wrist - ADM 7   24.1     B.Elbow ADM 5.9  8.0  89.7 B.Elbow - Wrist 20 59 ?49 23.1     A.Elbow ADM 7.7  8.5  106 A.Elbow - B.Elbow 10 58 ?49 24.2         A.Elbow - Wrist                 SNC    Nerve / Sites Rec. Site Peak Lat Ref.  Amp Ref. Segments Distance    ms ms V V  cm  L Median - Orthodromic (Dig II, Mid palm)     Dig II Wrist 4.6 ?3.4 4 ?10 Dig II - Wrist 13  R Median - Orthodromic (Dig II, Mid palm)     Dig II Wrist 4.0 ?3.4 4 ?10 Dig II - Wrist 13  L Ulnar - Orthodromic, (Dig V, Mid palm)     Dig V Wrist 2.5 ?3.1 5 ?5 Dig V - Wrist 11  R Ulnar - Orthodromic, (Dig V, Mid palm)     Dig V Wrist 2.6 ?3.1 6 ?5 Dig V - Wrist 33              F  Wave    Nerve F Lat Ref.   ms ms  L Ulnar - ADM 26.8 ?32.0  R Ulnar - ADM 26.8 ?32.0

## 2018-03-21 ENCOUNTER — Ambulatory Visit (INDEPENDENT_AMBULATORY_CARE_PROVIDER_SITE_OTHER): Payer: Self-pay | Admitting: Sports Medicine

## 2018-03-21 ENCOUNTER — Encounter: Payer: Self-pay | Admitting: Sports Medicine

## 2018-03-21 ENCOUNTER — Encounter: Payer: Self-pay | Admitting: Internal Medicine

## 2018-03-21 ENCOUNTER — Ambulatory Visit: Payer: Self-pay | Attending: Internal Medicine | Admitting: Internal Medicine

## 2018-03-21 VITALS — BP 119/79 | HR 81 | Temp 97.9°F | Resp 16 | Wt 205.6 lb

## 2018-03-21 VITALS — BP 118/78 | Ht 66.5 in | Wt 200.0 lb

## 2018-03-21 DIAGNOSIS — R21 Rash and other nonspecific skin eruption: Secondary | ICD-10-CM | POA: Insufficient documentation

## 2018-03-21 DIAGNOSIS — Z9889 Other specified postprocedural states: Secondary | ICD-10-CM | POA: Insufficient documentation

## 2018-03-21 DIAGNOSIS — Z818 Family history of other mental and behavioral disorders: Secondary | ICD-10-CM | POA: Insufficient documentation

## 2018-03-21 DIAGNOSIS — Z8249 Family history of ischemic heart disease and other diseases of the circulatory system: Secondary | ICD-10-CM | POA: Insufficient documentation

## 2018-03-21 DIAGNOSIS — Z803 Family history of malignant neoplasm of breast: Secondary | ICD-10-CM | POA: Insufficient documentation

## 2018-03-21 DIAGNOSIS — F17210 Nicotine dependence, cigarettes, uncomplicated: Secondary | ICD-10-CM | POA: Insufficient documentation

## 2018-03-21 DIAGNOSIS — L02224 Furuncle of groin: Secondary | ICD-10-CM | POA: Insufficient documentation

## 2018-03-21 DIAGNOSIS — M25512 Pain in left shoulder: Secondary | ICD-10-CM | POA: Insufficient documentation

## 2018-03-21 DIAGNOSIS — F319 Bipolar disorder, unspecified: Secondary | ICD-10-CM | POA: Insufficient documentation

## 2018-03-21 DIAGNOSIS — E782 Mixed hyperlipidemia: Secondary | ICD-10-CM | POA: Insufficient documentation

## 2018-03-21 DIAGNOSIS — Z79899 Other long term (current) drug therapy: Secondary | ICD-10-CM | POA: Insufficient documentation

## 2018-03-21 DIAGNOSIS — K219 Gastro-esophageal reflux disease without esophagitis: Secondary | ICD-10-CM | POA: Insufficient documentation

## 2018-03-21 DIAGNOSIS — G5603 Carpal tunnel syndrome, bilateral upper limbs: Secondary | ICD-10-CM | POA: Insufficient documentation

## 2018-03-21 DIAGNOSIS — Z780 Asymptomatic menopausal state: Secondary | ICD-10-CM | POA: Insufficient documentation

## 2018-03-21 DIAGNOSIS — N898 Other specified noninflammatory disorders of vagina: Secondary | ICD-10-CM | POA: Insufficient documentation

## 2018-03-21 DIAGNOSIS — G8929 Other chronic pain: Secondary | ICD-10-CM | POA: Insufficient documentation

## 2018-03-21 DIAGNOSIS — M25551 Pain in right hip: Secondary | ICD-10-CM | POA: Insufficient documentation

## 2018-03-21 MED ORDER — SULFAMETHOXAZOLE-TRIMETHOPRIM 400-80 MG PO TABS: 1.0000 | ORAL_TABLET | Freq: Two times a day (BID) | ORAL | 0 refills | Status: DC

## 2018-03-21 MED ORDER — DICLOFENAC SODIUM 75 MG PO TBEC: DELAYED_RELEASE_TABLET | ORAL | 0 refills | Status: DC

## 2018-03-21 MED ORDER — DICLOFENAC SODIUM 1 % TD GEL: 2.0000 g | Freq: Four times a day (QID) | TRANSDERMAL | 1 refills | Status: DC

## 2018-03-21 MED ORDER — ACYCLOVIR 400 MG PO TABS: 400.0000 mg | ORAL_TABLET | Freq: Three times a day (TID) | ORAL | 0 refills | Status: DC

## 2018-03-21 MED FILL — ACYCLOVIR 400 MG TABLET: 400 | 7 days supply | Qty: 21 | Fill #0

## 2018-03-21 MED FILL — ?SULFAMETHOXAXOLE-TMP-SS TA: 400-80 | 7 days supply | Qty: 14 | Fill #0

## 2018-03-21 MED FILL — DICLOFENAC SOD EC 75 MG TAB: 75 | 30 days supply | Qty: 60 | Fill #0

## 2018-03-21 MED FILL — DICLOFENAC SODIUM 1% GEL: 1 | 12 days supply | Qty: 100 | Fill #0

## 2018-03-21 NOTE — Progress Notes (Signed)
Pt states she slept with a guy 3-4 weeks ago  Pt states since then she have notice a little patch of cluster blisters right on top of her vagina and a very large blister located by the groin area  Pt states she is having some discharge  Pt states it looks like a cold sore or a fever blister forming and she just noticed it yesterday  Pt states she has never had a cold sore or a fever blister

## 2018-03-21 NOTE — Progress Notes (Signed)
Patient ID: Katie Sherman, female    DOB: 1966/03/23  MRN: [id number]  CC: Vaginal Discharge   Subjective: Katie Sherman is a 52 y.o. female who presents for UC visit. Her concerns today include:  Pt with hx of GERD, HL, bipolar 1, chronic LT shoulder and Rt hip pain.   Pt has a new sex partner.  Had unprotected sex with him 3-4 wks ago -2-3 days ago, she noticed a cluster of blisters on the mons pubis.  Also has a small boil "in crease of panty line" on RT side -endorses white dischg x 1 wk.  -thinks she also has the start of a fever blister on lower lip -no previous hx of HSV  Patient Active Problem List   Diagnosis Date Noted  . Bilateral carpal tunnel syndrome 03/19/2018  . Mixed hyperlipidemia 11/14/2017  . 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 Postmenopausal 01/22/2017  . Bipolar 1 disorder, depressed ([place]) 12/29/2016  . Family history of suicide 11/20/2016  . Depression 11/20/2016  . Family history of breast cancer 11/20/2016  . Adhesive capsulitis of left shoulder 11/19/2016  . Greater trochanteric bursitis of right hip 11/19/2016     Current Outpatient Medications on File Prior to Visit  Medication Sig Dispense Refill  . cyclobenzaprine (FLEXERIL) 10 MG tablet Take 1 tablet (10 mg total) by mouth daily as needed for muscle spasms. 30 tablet 3  . diclofenac (VOLTAREN) 75 MG EC tablet Take one tab twice daily with food 60 tablet 0  . diclofenac sodium (VOLTAREN) 1 % GEL Apply 2 g topically 4 (four) times daily. 100 g 1  . gabapentin (NEURONTIN) 300 MG capsule Take 1 capsule (300 mg total) by mouth 3 (three) times daily. 90 capsule 0  . hydrOXYzine (ATARAX/VISTARIL) 50 MG tablet Take 1 tablet (50 mg total) by mouth 3 (three) times daily as needed. 90 tablet 1  . lamoTRIgine (LAMICTAL) 100 MG tablet Take 3 tablets (300 mg total) by mouth daily. 90 tablet 0  . nitroGLYCERIN (NITRODUR - DOSED IN MG/24 HR) 0.2 mg/hr patch Use 1/4 patch daily to the  affected area 30 patch 1  . omeprazole (PRILOSEC) 20 MG capsule Take 1 capsule (20 mg total) by mouth daily. 90 capsule 1  . traZODone (DESYREL) 50 MG tablet Take 4 tablets (200 mg total) by mouth at bedtime as needed for sleep. 120 tablet 0   No current facility-administered medications on file prior to visit.     No Active Allergies  Social History   Socioeconomic History  . Marital status: Widowed    Spouse name: Not on file  . Number of children: Not on file  . Years of education: Not on file  . Highest education level: Not on file  Occupational History  . Occupation: unemployed  Social Needs  . Financial resource strain: Not on file  . Food insecurity:    Worry: Not on file    Inability: Not on file  . Transportation needs:    Medical: Not on file    Non-medical: Not on file  Tobacco Use  . Smoking status: Current Every Day Smoker    Packs/day: 1.00    Years: 36.00    Pack years: 36.00    Types: Cigarettes    Start date: 31  . Smokeless tobacco: Never Used  . Tobacco comment: 3 months without  Substance and Sexual Activity  . Alcohol use: No  . Drug use: No    Types: Marijuana  Comment: stopped 04/06/2017  . Sexual activity: Not Currently    Birth control/protection: None  Lifestyle  . Physical activity:    Days per week: Not on file    Minutes per session: Not on file  . Stress: Not on file  Relationships  . Social connections:    Talks on phone: Not on file    Gets together: Not on file    Attends religious service: Not on file    Active member of club or organization: Not on file    Attends meetings of clubs or organizations: Not on file    Relationship status: Not on file  . Intimate partner violence:    Fear of current or ex partner: Not on file    Emotionally abused: Not on file    Physically abused: Not on file    Forced sexual activity: Not on file  Other Topics Concern  . Not on file  Social History Narrative   Prior traffic control  specialist - unemployed now   Widow   5 children, 10 grandchildren   Moved here from [place] 2013    Family History  Problem Relation Age of Onset  . Heart attack Mother 64  . Heart disease Father   . Heart attack Father 40    Past Surgical History:  Procedure Laterality Date  . CERVICAL CONE BIOPSY    . TUBAL LIGATION      ROS: Review of Systems Neg except as above PHYSICAL EXAM: BP 119/79   Pulse 81   Temp 97.9 F (36.6 C) (Oral)   Resp 16   Wt 205 lb 9.6 oz (93.3 kg)   LMP 04/29/2012   SpO2 96%   BMI 32.69 kg/m   Physical Exam  General appearance - alert, well appearing, and in no distress Mental status - normal mood, behavior, speech, dress, motor activity, and thought processes Pelvic - CMA Pollock present:  1 cm soft, raised, tender area right upper inner thigh close to the perineum.  No fluctuance appreciated.  It is not erythematous.  Also noted is a cluster of small vesicles lower aspect of the mons pubis centrally.  No other lesions or ulcers seen on the labia or clitoris.  Small amount of clear to off-white discharge in the vaginal vault and around the cervix. Skin: No lesions seen on the lips.  ASSESSMENT AND PLAN: 1. Vaginal discharge - Cervicovaginal ancillary only Patient declined HIV testing. 2. 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  Does not have a dermatomal pattern to suggest zoster.  We will check HSV and varicella PCR.  In the meantime we will put her on acyclovir 3 times daily for 7 days.  Advised good handwashing and to avoid sexual contact until this is completely healed. - HSV and VZV PCR Panel - acyclovir (ZOVIRAX) 400 MG tablet; Take 1 tablet (400 mg total) by mouth 3 (three) times daily.  Dispense: 21 tablet; Refill: 0  3. Boil of groin - sulfamethoxazole-trimethoprim (BACTRIM) 400-80 MG tablet; Take 1 tablet by mouth 2 (two) times daily.  Dispense: 14 tablet; Refill: 0  Patient was given the opportunity to ask questions.  Patient verbalized  understanding of the plan and was able to repeat key elements of the plan.   Orders Placed This Encounter  Procedures  . HSV and VZV PCR Panel     Requested Prescriptions   Signed Prescriptions Disp Refills  . acyclovir (ZOVIRAX) 400 MG tablet 21 tablet 0    Sig: Take 1 tablet (  400 mg total) by mouth 3 (three) times daily.  Marland Kitchen sulfamethoxazole-trimethoprim (BACTRIM) 400-80 MG tablet 14 tablet 0    Sig: Take 1 tablet by mouth 2 (two) times daily.    No follow-ups on file.  Karle Plumber, MD, FACP

## 2018-03-21 NOTE — Patient Instructions (Signed)
After reviewing your nerve conduction study, will we have confirmed your hand and wrist pain is due to carpal tunnel syndrome. I encourage you to continue to use the wrist braces Continue to take the gabapentin 3 times daily. I placed an order for 2 new medications for you.  You may choose which medication you wish to use: 1.  Diclofenac gel-you can use this up to 4 times daily 2.  Diclofenac 75 mg tablets.  You can take 1 of these every 12 hours with food.  Do not combine this with other NSAIDs  If you change your mind about getting steroid injections for your carpal tunnel syndrome, please contact the clinic and we will have you scheduled. Otherwise, follow-up in 4 weeks.

## 2018-03-21 NOTE — Progress Notes (Signed)
  Katie Sherman - 52 y.o. female MRN [id number]  Date of birth: [birth year]    SUBJECTIVE:      Chief Complaint:/ HPI:  52 year old female here for follow-up for bilateral wrist pain.  She recently underwent nerve conduction study which confirmed mild bilateral carpal tunnel syndrome.  Patient admits to not wearing her wrist braces at night.  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  She is also taking Neurontin 300 mg 3 times daily but admits to missing occasional doses.  Overall her symptoms have not changed she continues to have left (10/10) worse than right wrist pain rating into the first 3 digits of the hands.  Pain continues to be worsened with use of her left hand, specifically while working.  Only relieved with complete rest.  She denies any new swelling, erythema, or bruising.  She denies any weakness.  No skin changes.   ROS:     See HPI  PERTINENT  PMH / PSH FH / / SH:  Past Medical, Surgical, Social, and Family History Reviewed & Updated in the EMR.     OBJECTIVE: BP 118/78   Ht 5' 6.5" (1.689 m)   Wt 200 lb (90.7 kg)   LMP 04/29/2012   BMI 31.80 kg/m   Physical Exam:  Vital signs are reviewed.  GEN: Alert and oriented, NAD Pulm: Breathing unlabored   MSK: Right hand: Inspection: No obvious deformity. No swelling, erythema or bruising Palpation: no TTP ROM: Full ROM of the digits and wrist Strength: 5/5 strength in the forearm, wrist and interosseus muscles Neurovascular: NV intact Special tests: Negative finkelstein's, positive Tinel's at the carpal tunnel, positive Phalen's  Left hand: Inspection: No obvious deformity. No swelling, erythema or bruising Palpation: Tenderness over the carpal tunnel ROM: Full ROM of the digits and wrist Strength: 5/5 strength in the forearm, wrist and interosseus muscles Neurovascular: NV intact Special tests: Negative Finkelstein's, negative tinel's at the carpal tunnel, positive  Phalen's     ASSESSMENT & PLAN:  1.  Bilateral mild carpal tunnel syndrome- confirmed by nerve conduction study.  Left worse than right.  No weakness on exam - Encouraged regular wear of wrist braces for comfort -Continue Neurontin - Discussed steroid injections today.  Patient declined at this time and will contact the clinic if she decides to move forward with these - Prescription placed for diclofenac gel 4 times daily.  I also placed a prescription for diclofenac 75 mg tablet showed the diclofenac gel be too expensive -She will follow-up in 4 weeks if she does not return sooner for steroid injections.

## 2018-03-22 LAB — CERVICOVAGINAL ANCILLARY ONLY
Bacterial vaginitis: POSITIVE — AB
CANDIDA VAGINITIS: NEGATIVE
CHLAMYDIA, DNA PROBE: NEGATIVE
NEISSERIA GONORRHEA: NEGATIVE
Trichomonas: NEGATIVE

## 2018-03-23 ENCOUNTER — Other Ambulatory Visit: Payer: Self-pay | Admitting: Internal Medicine

## 2018-03-23 MED ORDER — METRONIDAZOLE 500 MG PO TABS: 500.0000 mg | ORAL_TABLET | Freq: Two times a day (BID) | ORAL | 0 refills | Status: DC

## 2018-03-25 LAB — HSV AND VZV PCR PANEL
HSV 2 DNA: NEGATIVE
HSV-1 DNA: NEGATIVE
Varicella-Zoster, PCR: NEGATIVE

## 2018-03-25 MED FILL — metroNIDAZOLE 500 MG TABS: 500 | 7 days supply | Qty: 14 | Fill #0

## 2018-03-27 ENCOUNTER — Ambulatory Visit (HOSPITAL_COMMUNITY): Payer: Self-pay | Admitting: Psychiatry

## 2018-03-28 ENCOUNTER — Ambulatory Visit: Payer: Self-pay | Admitting: Sports Medicine

## 2018-04-04 ENCOUNTER — Telehealth (HOSPITAL_COMMUNITY): Payer: Self-pay | Admitting: *Deleted

## 2018-04-04 DIAGNOSIS — F99 Mental disorder, not otherwise specified: Principal | ICD-10-CM

## 2018-04-04 DIAGNOSIS — F319 Bipolar disorder, unspecified: Secondary | ICD-10-CM

## 2018-04-04 DIAGNOSIS — F5105 Insomnia due to other mental disorder: Secondary | ICD-10-CM

## 2018-04-04 MED ORDER — GABAPENTIN 300 MG PO CAPS: 300.0000 mg | ORAL_CAPSULE | Freq: Three times a day (TID) | ORAL | 0 refills | Status: DC

## 2018-04-04 MED ORDER — TRAZODONE HCL 50 MG PO TABS: 200.0000 mg | ORAL_TABLET | Freq: Every evening | ORAL | 0 refills | Status: DC | PRN

## 2018-04-04 MED ORDER — LAMOTRIGINE 100 MG PO TABS: 300.0000 mg | ORAL_TABLET | Freq: Every day | ORAL | 0 refills | Status: DC

## 2018-04-04 MED FILL — lamoTRIgine 100 MG TABS: 100 | 30 days supply | Qty: 90 | Fill #0

## 2018-04-04 MED FILL — GABAPENTIN 300 MG CAPSULE: 300 | 30 days supply | Qty: 90 | Fill #0

## 2018-04-04 MED FILL — traZODone HCL 50 MG TABS: 50 | 30 days supply | Qty: 120 | Fill #0

## 2018-04-04 NOTE — Telephone Encounter (Signed)
Patient called for refills of Trazodone, Lamictal, and Gabapentin. Chart reviewed, next appointment 10/17. Refills for 30 day supply given.

## 2018-04-10 MED FILL — CYCLOBENZAPRINE 10 MG TAB: 10 | 30 days supply | Qty: 30 | Fill #3

## 2018-04-10 MED FILL — ?OMEPRazole 20mg CPDR: 20 | 30 days supply | Qty: 30 | Fill #5

## 2018-04-17 ENCOUNTER — Encounter

## 2018-04-17 ENCOUNTER — Encounter: Payer: Self-pay | Admitting: Neurology

## 2018-04-18 ENCOUNTER — Encounter: Payer: Self-pay | Admitting: Sports Medicine

## 2018-04-18 ENCOUNTER — Ambulatory Visit (INDEPENDENT_AMBULATORY_CARE_PROVIDER_SITE_OTHER): Payer: Self-pay | Admitting: Sports Medicine

## 2018-04-18 VITALS — BP 120/82 | Ht 67.0 in | Wt 200.0 lb

## 2018-04-18 DIAGNOSIS — G5603 Carpal tunnel syndrome, bilateral upper limbs: Secondary | ICD-10-CM

## 2018-04-18 MED ORDER — METHYLPREDNISOLONE ACETATE 40 MG/ML IJ SUSP
40.0000 mg | Freq: Once | INTRAMUSCULAR | Status: AC
  Administered 2018-04-18: 40 mg via INTRA_ARTICULAR

## 2018-04-18 NOTE — Progress Notes (Signed)
  Katie Sherman - 52 y.o. female MRN [id number]  Date of birth: 17-Nov-1965    SUBJECTIVE:      Chief Complaint:/ HPI:  Katie Sherman returns today for bilateral carpal tunnel syndrome.  She reports no improvement over the last few weeks since her prior visit.  She admits to not wearing the wrist braces due to them causing discomfort.  She would like to proceed with steroid injections today.   ROS:     See HPI  PERTINENT  PMH / PSH FH / / SH:  Past Medical, Surgical, Social, and Family History Reviewed & Updated in the EMR.     OBJECTIVE: BP 120/82   Ht 5\' 7"  (1.702 m)   Wt 200 lb (90.7 kg)   LMP 04/29/2012   BMI 31.32 kg/m   Physical Exam:  Vital signs are reviewed.  GEN: Alert and oriented, NAD Pulm: Breathing unlabored PSY: normal mood, congruent affect  MSK: Hand/wrist: Inspection: No obvious deformity. No swelling, erythema or bruising Palpation: no TTP ROM: Full ROM of the digits and wrist Strength: 5/5 strength in the forearm, wrist and interosseus muscles Neurovascular: NV intact Special tests: Positive Tinel's at the carpal tunnel bilaterally,    ASSESSMENT & PLAN:  1.  Mild bilateral carpal tunnel syndrome - Bilateral steroid injections performed today -Follow-up 4 weeks   Procedure performed: Bilateral carpal tunnel injection, ultrasound-guided  Consent obtained and verified. Time-out conducted. Noted no overlying erythema, induration, or other signs of local infection. The median nerve was identified with ultrasound. The overlying skin was prepped in a sterile fashion.  Medication was injected under direct visualization by ultrasound Topical analgesic spray: Ethyl chloride. Needle: 25-gauge, 1.5 inch Completed without difficulty. Meds: Depo-Medrol 40 mg, lidocaine 1 cc injected into each wrist  Advised to call if fevers/chills, erythema, induration, drainage, or persistent bleeding.

## 2018-04-25 ENCOUNTER — Ambulatory Visit (INDEPENDENT_AMBULATORY_CARE_PROVIDER_SITE_OTHER): Payer: Self-pay | Admitting: Psychiatry

## 2018-04-25 ENCOUNTER — Other Ambulatory Visit: Payer: Self-pay

## 2018-04-25 ENCOUNTER — Encounter (HOSPITAL_COMMUNITY): Payer: Self-pay | Admitting: Psychiatry

## 2018-04-25 VITALS — BP 120/86 | HR 73 | Resp 18 | Wt 201.8 lb

## 2018-04-25 DIAGNOSIS — F5105 Insomnia due to other mental disorder: Secondary | ICD-10-CM

## 2018-04-25 DIAGNOSIS — F4310 Post-traumatic stress disorder, unspecified: Secondary | ICD-10-CM

## 2018-04-25 DIAGNOSIS — F99 Mental disorder, not otherwise specified: Secondary | ICD-10-CM

## 2018-04-25 DIAGNOSIS — F319 Bipolar disorder, unspecified: Secondary | ICD-10-CM

## 2018-04-25 DIAGNOSIS — F1910 Other psychoactive substance abuse, uncomplicated: Secondary | ICD-10-CM

## 2018-04-25 MED ORDER — QUETIAPINE FUMARATE 25 MG PO TABS: ORAL_TABLET | ORAL | 1 refills | Status: DC

## 2018-04-25 MED ORDER — HYDROXYZINE HCL 50 MG PO TABS: 50.0000 mg | ORAL_TABLET | Freq: Three times a day (TID) | ORAL | 1 refills | Status: DC | PRN

## 2018-04-25 MED ORDER — TRAZODONE HCL 50 MG PO TABS: 200.0000 mg | ORAL_TABLET | Freq: Every evening | ORAL | 1 refills | Status: DC | PRN

## 2018-04-25 MED ORDER — GABAPENTIN 300 MG PO CAPS: 300.0000 mg | ORAL_CAPSULE | Freq: Three times a day (TID) | ORAL | 1 refills | Status: DC

## 2018-04-25 MED ORDER — LAMOTRIGINE ER 300 MG PO TB24: 300.0000 mg | ORAL_TABLET | Freq: Every day | ORAL | 2 refills | Status: DC

## 2018-04-25 MED FILL — traZODone HCL 50 MG TABS: 50 | 30 days supply | Qty: 90 | Fill #0

## 2018-04-25 MED FILL — QUETIAPINE FUMARATE 25 MG T: 25 | 30 days supply | Qty: 60 | Fill #0

## 2018-04-25 MED FILL — LamoTRIgine ER 100 MG TB24: 100 | 30 days supply | Qty: 90 | Fill #0

## 2018-04-25 MED FILL — hydrOXYzine HCL 50 MG TABS: 50 | 30 days supply | Qty: 90 | Fill #0
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
## 2018-05-07 ENCOUNTER — Telehealth: Payer: Self-pay | Admitting: Internal Medicine

## 2018-05-07 DIAGNOSIS — K219 Gastro-esophageal reflux disease without esophagitis: Secondary | ICD-10-CM

## 2018-05-07 DIAGNOSIS — M62830 Muscle spasm of back: Secondary | ICD-10-CM

## 2018-05-07 DIAGNOSIS — M545 Low back pain, unspecified: Secondary | ICD-10-CM

## 2018-05-07 MED ORDER — CYCLOBENZAPRINE HCL 10 MG PO TABS: 10.0000 mg | ORAL_TABLET | Freq: Every day | ORAL | 2 refills | Status: DC | PRN

## 2018-05-07 MED ORDER — OMEPRAZOLE 20 MG PO CPDR: 20.0000 mg | DELAYED_RELEASE_CAPSULE | Freq: Every day | ORAL | 2 refills | Status: DC

## 2018-05-07 MED FILL — CYCLOBENZAPRINE 10 MG TAB: 10 | 30 days supply | Qty: 30 | Fill #0

## 2018-05-07 MED FILL — OMEPRAZOLE 20 MG CAP: 20 | 30 days supply | Qty: 30 | Fill #0

## 2018-05-07 NOTE — Telephone Encounter (Signed)
Patient needs medication refill. Flexeril prilosec sent to [facility] pharmacy.

## 2018-05-07 NOTE — Telephone Encounter (Deleted)
Patient would like refills for flexeril

## 2018-05-16 ENCOUNTER — Ambulatory Visit: Payer: Self-pay | Admitting: Sports Medicine

## 2018-05-30 MED FILL — lamoTRIgine ER 100 MG TB24: 100 | 30 days supply | Qty: 90 | Fill #1

## 2018-06-03 MED FILL — traZODone HCL 50 MG TABS: 50 | 30 days supply | Qty: 90 | Fill #1

## 2018-06-04 ENCOUNTER — Encounter: Payer: Self-pay | Admitting: Internal Medicine

## 2018-06-04 ENCOUNTER — Ambulatory Visit: Payer: Self-pay | Attending: Internal Medicine | Admitting: Internal Medicine

## 2018-06-04 ENCOUNTER — Telehealth: Payer: Self-pay | Admitting: Internal Medicine

## 2018-06-04 VITALS — BP 115/77 | HR 65 | Temp 97.6°F | Resp 16 | Wt 206.0 lb

## 2018-06-04 DIAGNOSIS — Z79899 Other long term (current) drug therapy: Secondary | ICD-10-CM | POA: Insufficient documentation

## 2018-06-04 DIAGNOSIS — E782 Mixed hyperlipidemia: Secondary | ICD-10-CM | POA: Insufficient documentation

## 2018-06-04 DIAGNOSIS — K047 Periapical abscess without sinus: Secondary | ICD-10-CM | POA: Insufficient documentation

## 2018-06-04 DIAGNOSIS — G5603 Carpal tunnel syndrome, bilateral upper limbs: Secondary | ICD-10-CM | POA: Insufficient documentation

## 2018-06-04 DIAGNOSIS — F17210 Nicotine dependence, cigarettes, uncomplicated: Secondary | ICD-10-CM | POA: Insufficient documentation

## 2018-06-04 DIAGNOSIS — M25551 Pain in right hip: Secondary | ICD-10-CM | POA: Insufficient documentation

## 2018-06-04 DIAGNOSIS — F319 Bipolar disorder, unspecified: Secondary | ICD-10-CM | POA: Insufficient documentation

## 2018-06-04 DIAGNOSIS — Z8249 Family history of ischemic heart disease and other diseases of the circulatory system: Secondary | ICD-10-CM | POA: Insufficient documentation

## 2018-06-04 DIAGNOSIS — K219 Gastro-esophageal reflux disease without esophagitis: Secondary | ICD-10-CM | POA: Insufficient documentation

## 2018-06-04 MED ORDER — ACETAMINOPHEN-CODEINE #3 300-30 MG PO TABS: 1.0000 | ORAL_TABLET | Freq: Three times a day (TID) | ORAL | 0 refills | Status: DC | PRN

## 2018-06-04 MED ORDER — PENICILLIN V POTASSIUM 500 MG PO TABS: 500.0000 mg | ORAL_TABLET | Freq: Four times a day (QID) | ORAL | 0 refills | Status: DC

## 2018-06-04 MED FILL — ACETAMINOPHEN/COD #3 TABLET: 300-30 | 10 days supply | Qty: 30 | Fill #0

## 2018-06-04 MED FILL — PENICILLIN VK 500 MG TABLET: 500 | 7 days supply | Qty: 28 | Fill #0

## 2018-06-04 NOTE — Progress Notes (Signed)
Patient ID: Katie Sherman, female    DOB: 04-May-1966  MRN: [id number]  CC: Dental Pain   Subjective: Katie Sherman is a 52 y.o. female who presents for UC visit Her concerns today include:  Pt with hx of GERD, HL, bipolar 1, chronic LT shoulder and Rt hip pain.   C/o bad tooth ache on RT side of mouth for the past 3 wks.  Reports having some cavities on this side Taking 3 Extra Strength Tylenol Q 5 hrs Woke this morning with swelling of the RT jaw.  Denies any problems swallowing.  Patient Active Problem List   Diagnosis Date Noted  . Bilateral carpal tunnel syndrome 03/19/2018  . Mixed hyperlipidemia 11/14/2017  . 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 Postmenopausal 01/22/2017  . Bipolar 1 disorder, depressed ([place]) 12/29/2016  . Family history of suicide 11/20/2016  . Depression 11/20/2016  . Family history of breast cancer 11/20/2016  . Adhesive capsulitis of left shoulder 11/19/2016  . Greater trochanteric bursitis of right hip 11/19/2016     Current Outpatient Medications on File Prior to Visit  Medication Sig Dispense Refill  . acyclovir (ZOVIRAX) 400 MG tablet Take 1 tablet (400 mg total) by mouth 3 (three) times daily. (Patient not taking: Reported on 04/18/2018) 21 tablet 0  . cyclobenzaprine (FLEXERIL) 10 MG tablet Take 1 tablet (10 mg total) by mouth daily as needed for muscle spasms. 30 tablet 2  . diclofenac (VOLTAREN) 75 MG EC tablet Take one tab twice daily with food (Patient not taking: Reported on 06/04/2018) 60 tablet 0  . diclofenac sodium (VOLTAREN) 1 % GEL Apply 2 g topically 4 (four) times daily. (Patient not taking: Reported on 04/25/2018) 100 g 1  . gabapentin (NEURONTIN) 300 MG capsule Take 1 capsule (300 mg total) by mouth 3 (three) times daily. 90 capsule 1  . hydrOXYzine (ATARAX/VISTARIL) 50 MG tablet Take 1 tablet (50 mg total) by mouth 3 (three) times daily as needed. 90 tablet 1  . LamoTRIgine (LAMICTAL XR) 300 MG TB24 24 hour  tablet Take 1 tablet (300 mg total) by mouth daily at 8 pm. 30 tablet 2  . metroNIDAZOLE (FLAGYL) 500 MG tablet Take 1 tablet (500 mg total) by mouth 2 (two) times daily. (Patient not taking: Reported on 04/18/2018) 14 tablet 0  . nitroGLYCERIN (NITRODUR - DOSED IN MG/24 HR) 0.2 mg/hr patch Use 1/4 patch daily to the affected area 30 patch 1  . omeprazole (PRILOSEC) 20 MG capsule Take 1 capsule (20 mg total) by mouth daily. 30 capsule 2  . QUEtiapine (SEROQUEL) 25 MG tablet Take 1-2 tab at bed time 60 tablet 1  . sulfamethoxazole-trimethoprim (BACTRIM) 400-80 MG tablet Take 1 tablet by mouth 2 (two) times daily. (Patient not taking: Reported on 04/18/2018) 14 tablet 0  . traZODone (DESYREL) 50 MG tablet Take 4 tablets (200 mg total) by mouth at bedtime as needed for sleep. Take 2-3 tab at be dtime 90 tablet 1   No current facility-administered medications on file prior to visit.     No Active Allergies  Social History   Socioeconomic History  . Marital status: Widowed    Spouse name: Not on file  . Number of children: Not on file  . Years of education: Not on file  . Highest education level: Not on file  Occupational History  . Occupation: unemployed  Social Needs  . Financial resource strain: Not on file  . Food insecurity:    Worry: Not  on file    Inability: Not on file  . Transportation needs:    Medical: Not on file    Non-medical: Not on file  Tobacco Use  . Smoking status: Current Every Day Smoker    Packs/day: 1.00    Years: 36.00    Pack years: 36.00    Types: Cigarettes    Start date: 65  . Smokeless tobacco: Never Used  . Tobacco comment: 3 months without  Substance and Sexual Activity  . Alcohol use: No  . Drug use: No    Types: Marijuana    Comment: stopped 04/06/2017  . Sexual activity: Not Currently    Birth control/protection: None  Lifestyle  . Physical activity:    Days per week: Not on file    Minutes per session: Not on file  . Stress: Not on  file  Relationships  . Social connections:    Talks on phone: Not on file    Gets together: Not on file    Attends religious service: Not on file    Active member of club or organization: Not on file    Attends meetings of clubs or organizations: Not on file    Relationship status: Not on file  . Intimate partner violence:    Fear of current or ex partner: Not on file    Emotionally abused: Not on file    Physically abused: Not on file    Forced sexual activity: Not on file  Other Topics Concern  . Not on file  Social History Narrative   Prior traffic control specialist - unemployed now   Widow   5 children, 10 grandchildren   Moved here from [place] 2013    Family History  Problem Relation Age of Onset  . Heart attack Mother 31  . Heart disease Father   . Heart attack Father 53    Past Surgical History:  Procedure Laterality Date  . CERVICAL CONE BIOPSY    . TUBAL LIGATION      ROS: Review of Systems Negative except as stated above. PHYSICAL EXAM: BP 115/77   Pulse 65   Temp 97.6 F (36.4 C) (Oral)   Resp 16   Wt 206 lb (93.4 kg)   LMP 04/29/2012   SpO2 100%   BMI 32.26 kg/m   Physical Exam  General appearance - alert, well appearing, and in no distress Mental status - normal mood, behavior, speech, dress, motor activity, and thought processes Mouth -mild edema noted over the right cheek.  Patient has large cavity of the first molar in the lower jaw on the third molar in the upper jaw on the right side.  The molar in the upper jaw is tender to touch.  She has mild inflammation noted of the gum in the upper jaw on the right side.  Throat is clear without erythema or exudates.   ASSESSMENT AND PLAN:  1. Dental abscess Patient advised to see a dentist as soon as possible.  Referral submitted but patient informed that it takes a while to be seen so she may want to access dentistry on her own.  I have given some Tylenol 3's to use as needed for pain.  Advised  to stop the extra strength Tylenol.  Echo reviewed. - penicillin v potassium (VEETID) 500 MG tablet; Take 1 tablet (500 mg total) by mouth 4 (four) times daily.  Dispense: 28 tablet; Refill: 0  Patient was given the opportunity to ask questions.  Patient verbalized understanding  of the plan and was able to repeat key elements of the plan.   No orders of the defined types were placed in this encounter.    Requested Prescriptions   Signed Prescriptions Disp Refills  . penicillin v potassium (VEETID) 500 MG tablet 28 tablet 0    Sig: Take 1 tablet (500 mg total) by mouth 4 (four) times daily.  Marland Kitchen acetaminophen-codeine (TYLENOL #3) 300-30 MG tablet 30 tablet 0    Sig: Take 1-2 tablets by mouth every 8 (eight) hours as needed for moderate pain.    Return if symptoms worsen or fail to improve.  Karle Plumber, MD, FACP

## 2018-06-04 NOTE — Telephone Encounter (Signed)
Patient came in asking for antibiotics I made the patient a same day appointment.

## 2018-06-04 NOTE — Patient Instructions (Signed)
Stop the over-the-counter Tylenol.  I have prescribed some Tylenol with Codeine to use as needed for the pain.  You should try to get in with a dentist as soon as possible.

## 2018-06-10 MED FILL — ?OMEPRAZOLE 20 MG CAPSULE D: 20 | 30 days supply | Qty: 30 | Fill #1

## 2018-06-10 MED FILL — CYCLOBENZAPRINE 10 MG TAB: 10 | 30 days supply | Qty: 30 | Fill #1

## 2018-06-11 ENCOUNTER — Ambulatory Visit (HOSPITAL_COMMUNITY): Payer: Self-pay | Admitting: Psychiatry

## 2018-06-13 ENCOUNTER — Ambulatory Visit: Payer: Self-pay | Admitting: Sports Medicine

## 2018-07-04 MED FILL — lamoTRIgine ER 100 MG TB24: 100 | 30 days supply | Qty: 90 | Fill #2

## 2018-07-04 MED FILL — hydrOXYzine HCL 50 MG TABS: 50 | 30 days supply | Qty: 90 | Fill #1

## 2018-07-04 MED FILL — traZODone HCL 50 MG TABS: 50 | 30 days supply | Qty: 90 | Fill #1

## 2018-07-11 MED FILL — QUETIAPINE FUMARATE 25 MG T: 25 | 30 days supply | Qty: 60 | Fill #1

## 2018-07-11 MED FILL — CYCLOBENZAPRINE 10 MG TAB: 10 | 30 days supply | Qty: 30 | Fill #2

## 2018-07-11 MED FILL — ?OMEPRAZOLE 20 MG CAPSULE D: 20 | 30 days supply | Qty: 30 | Fill #2

## 2018-08-01 ENCOUNTER — Other Ambulatory Visit (HOSPITAL_COMMUNITY): Payer: Self-pay

## 2018-08-01 DIAGNOSIS — F5105 Insomnia due to other mental disorder: Secondary | ICD-10-CM

## 2018-08-01 DIAGNOSIS — F319 Bipolar disorder, unspecified: Secondary | ICD-10-CM

## 2018-08-01 DIAGNOSIS — F99 Mental disorder, not otherwise specified: Secondary | ICD-10-CM

## 2018-08-01 DIAGNOSIS — F4310 Post-traumatic stress disorder, unspecified: Secondary | ICD-10-CM

## 2018-08-01 MED ORDER — QUETIAPINE FUMARATE 25 MG PO TABS: ORAL_TABLET | ORAL | 0 refills | Status: DC

## 2018-08-01 MED ORDER — GABAPENTIN 300 MG PO CAPS: 300.0000 mg | ORAL_CAPSULE | Freq: Three times a day (TID) | ORAL | 0 refills | Status: DC

## 2018-08-01 MED ORDER — TRAZODONE HCL 50 MG PO TABS: 200.0000 mg | ORAL_TABLET | Freq: Every evening | ORAL | 0 refills | Status: DC | PRN

## 2018-08-01 MED ORDER — LAMOTRIGINE ER 300 MG PO TB24: 300.0000 mg | ORAL_TABLET | Freq: Every day | ORAL | 0 refills | Status: DC

## 2018-08-01 MED ORDER — HYDROXYZINE HCL 50 MG PO TABS: 50.0000 mg | ORAL_TABLET | Freq: Three times a day (TID) | ORAL | 0 refills | Status: DC | PRN

## 2018-08-02 MED FILL — GABAPENTIN 300 MG CAPSULE: 300 | 30 days supply | Qty: 90 | Fill #0

## 2018-08-02 MED FILL — $LAMICTAL XR 100 MG TABLET: 100 | 30 days supply | Qty: 90 | Fill #0

## 2018-08-02 MED FILL — traZODone HCL 50 MG TABS: 50 | 30 days supply | Qty: 90 | Fill #0

## 2018-08-02 MED FILL — hydrOXYzine HCL 50 MG TABS: 50 | 30 days supply | Qty: 90 | Fill #0

## 2018-08-12 ENCOUNTER — Telehealth: Payer: Self-pay | Admitting: Internal Medicine

## 2018-08-12 DIAGNOSIS — M545 Low back pain, unspecified: Secondary | ICD-10-CM

## 2018-08-12 DIAGNOSIS — M62830 Muscle spasm of back: Secondary | ICD-10-CM

## 2018-08-12 DIAGNOSIS — K219 Gastro-esophageal reflux disease without esophagitis: Secondary | ICD-10-CM

## 2018-08-12 NOTE — Telephone Encounter (Signed)
1) Medication(s) Requested (by name): omeprazole (PRILOSEC) 20 MG and cyclobenzaprine (FLEXERIL)    2) [facility]  3) Special Requests:   Approved medications will be sent to the pharmacy, we will reach out if there is an issue.  Requests made after 3pm may not be addressed until the following business day!  If a patient is unsure of the name of the medication(s) please note and ask patient to call back when they are able to provide all info, do not send to responsible party until all information is available!

## 2018-08-13 MED ORDER — OMEPRAZOLE 20 MG PO CPDR: 20.0000 mg | DELAYED_RELEASE_CAPSULE | Freq: Every day | ORAL | 2 refills | Status: DC

## 2018-08-13 MED ORDER — CYCLOBENZAPRINE HCL 10 MG PO TABS: 10.0000 mg | ORAL_TABLET | Freq: Every day | ORAL | 2 refills | Status: DC | PRN

## 2018-08-13 MED FILL — ?OMEPRAZOLE 20 MG CAPSULE D: 20 | 30 days supply | Qty: 30 | Fill #0

## 2018-08-13 MED FILL — CYCLOBENZAPRINE 10 MG TAB: 10 | 30 days supply | Qty: 30 | Fill #0

## 2018-09-03 ENCOUNTER — Ambulatory Visit: Payer: Self-pay | Admitting: Internal Medicine

## 2018-09-04 ENCOUNTER — Other Ambulatory Visit (HOSPITAL_COMMUNITY): Payer: Self-pay

## 2018-09-04 DIAGNOSIS — F4310 Post-traumatic stress disorder, unspecified: Secondary | ICD-10-CM

## 2018-09-04 DIAGNOSIS — F99 Mental disorder, not otherwise specified: Secondary | ICD-10-CM

## 2018-09-04 DIAGNOSIS — F5105 Insomnia due to other mental disorder: Secondary | ICD-10-CM

## 2018-09-04 DIAGNOSIS — F319 Bipolar disorder, unspecified: Secondary | ICD-10-CM

## 2018-09-04 MED ORDER — LAMOTRIGINE ER 300 MG PO TB24: 300.0000 mg | ORAL_TABLET | Freq: Every day | ORAL | 0 refills | Status: DC

## 2018-09-04 MED ORDER — QUETIAPINE FUMARATE 25 MG PO TABS: ORAL_TABLET | ORAL | 0 refills | Status: DC

## 2018-09-04 MED ORDER — HYDROXYZINE HCL 50 MG PO TABS: 50.0000 mg | ORAL_TABLET | Freq: Three times a day (TID) | ORAL | 0 refills | Status: DC | PRN

## 2018-09-04 MED ORDER — GABAPENTIN 300 MG PO CAPS: 300.0000 mg | ORAL_CAPSULE | Freq: Three times a day (TID) | ORAL | 0 refills | Status: DC

## 2018-09-04 MED ORDER — TRAZODONE HCL 50 MG PO TABS: 200.0000 mg | ORAL_TABLET | Freq: Every evening | ORAL | 0 refills | Status: DC | PRN

## 2018-09-05 MED FILL — $LAMICTAL XR 100 MG TABLET: 100 | 30 days supply | Qty: 90 | Fill #0

## 2018-09-05 MED FILL — traZODone HCL 50 MG TABS: 50 | 30 days supply | Qty: 90 | Fill #0

## 2018-09-05 MED FILL — QUETIAPINE FUMARATE 25 MG T: 25 | 30 days supply | Qty: 60 | Fill #0

## 2018-09-06 ENCOUNTER — Ambulatory Visit (INDEPENDENT_AMBULATORY_CARE_PROVIDER_SITE_OTHER): Payer: Self-pay | Admitting: Psychiatry

## 2018-09-06 DIAGNOSIS — F4310 Post-traumatic stress disorder, unspecified: Secondary | ICD-10-CM

## 2018-09-06 DIAGNOSIS — F5105 Insomnia due to other mental disorder: Secondary | ICD-10-CM

## 2018-09-06 DIAGNOSIS — F99 Mental disorder, not otherwise specified: Secondary | ICD-10-CM

## 2018-09-06 DIAGNOSIS — F319 Bipolar disorder, unspecified: Secondary | ICD-10-CM

## 2018-09-06 MED ORDER — HYDROXYZINE HCL 50 MG PO TABS: 50.0000 mg | ORAL_TABLET | Freq: Three times a day (TID) | ORAL | 0 refills | Status: DC | PRN

## 2018-09-06 MED ORDER — TRAZODONE HCL 50 MG PO TABS: ORAL_TABLET | ORAL | 1 refills | Status: DC

## 2018-09-06 MED ORDER — GABAPENTIN 300 MG PO CAPS: 300.0000 mg | ORAL_CAPSULE | Freq: Three times a day (TID) | ORAL | 1 refills | Status: DC

## 2018-09-06 MED ORDER — LAMOTRIGINE ER 300 MG PO TB24: 300.0000 mg | ORAL_TABLET | Freq: Every day | ORAL | 1 refills | Status: DC

## 2018-09-06 MED ORDER — QUETIAPINE FUMARATE 50 MG PO TABS: 50.0000 mg | ORAL_TABLET | Freq: Every day | ORAL | 1 refills | Status: DC
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
## 2018-09-09 MED FILL — CYCLOBENZAPRINE 10 MG TAB: 10 | 30 days supply | Qty: 30 | Fill #1

## 2018-09-09 MED FILL — ?OMEPRAZOLE 20 MG CAPSULE D: 20 | 30 days supply | Qty: 30 | Fill #1

## 2018-10-03 MED FILL — hydrOXYzine HCL 50 MG TABS: 50 | 30 days supply | Qty: 90 | Fill #0

## 2018-10-03 MED FILL — $LAMICTAL XR 300MG TABLET: 300 | 60 days supply | Qty: 60 | Fill #0

## 2018-10-03 MED FILL — QUETIAPINE FUMARATE 50 MG T: 50 | 30 days supply | Qty: 30 | Fill #0

## 2018-10-03 MED FILL — traZODone HCL 50 MG TABS: 50 | 30 days supply | Qty: 60 | Fill #0

## 2018-10-04 MED FILL — CYCLOBENZAPRINE 10 MG TAB: 10 | 30 days supply | Qty: 30 | Fill #2

## 2018-10-04 MED FILL — ?OMEPRAZOLE 20 MG CAPSULE D: 20 | 30 days supply | Qty: 30 | Fill #2

## 2018-10-09 ENCOUNTER — Other Ambulatory Visit (HOSPITAL_COMMUNITY): Payer: Self-pay

## 2018-10-09 DIAGNOSIS — F4310 Post-traumatic stress disorder, unspecified: Secondary | ICD-10-CM

## 2018-10-09 DIAGNOSIS — F319 Bipolar disorder, unspecified: Secondary | ICD-10-CM

## 2018-10-09 DIAGNOSIS — F5105 Insomnia due to other mental disorder: Secondary | ICD-10-CM

## 2018-10-09 DIAGNOSIS — F99 Mental disorder, not otherwise specified: Secondary | ICD-10-CM

## 2018-10-09 MED ORDER — LAMOTRIGINE ER 300 MG PO TB24: 300.0000 mg | ORAL_TABLET | Freq: Every day | ORAL | 0 refills | Status: DC

## 2018-10-09 MED ORDER — TRAZODONE HCL 50 MG PO TABS: ORAL_TABLET | ORAL | 0 refills | Status: DC

## 2018-10-09 MED ORDER — QUETIAPINE FUMARATE 50 MG PO TABS: 50.0000 mg | ORAL_TABLET | Freq: Every day | ORAL | 0 refills | Status: DC

## 2018-10-22 ENCOUNTER — Telehealth: Payer: Self-pay | Admitting: Internal Medicine

## 2018-10-22 DIAGNOSIS — K219 Gastro-esophageal reflux disease without esophagitis: Secondary | ICD-10-CM

## 2018-10-22 DIAGNOSIS — M545 Low back pain, unspecified: Secondary | ICD-10-CM

## 2018-10-22 DIAGNOSIS — M62830 Muscle spasm of back: Secondary | ICD-10-CM

## 2018-10-22 MED ORDER — CYCLOBENZAPRINE HCL 10 MG PO TABS: 10.0000 mg | ORAL_TABLET | Freq: Every day | ORAL | 2 refills | Status: DC | PRN

## 2018-10-22 MED ORDER — OMEPRAZOLE 20 MG PO CPDR: 20.0000 mg | DELAYED_RELEASE_CAPSULE | Freq: Every day | ORAL | 2 refills | Status: DC

## 2018-10-22 NOTE — Telephone Encounter (Signed)
1) Medication(s) Requested (by name): Flexeril omeprazole 2) Pharmacy of Choice: [facility] 3) Special Requests:   Approved medications will be sent to the pharmacy, we will reach out if there is an issue.  Requests made after 3pm may not be addressed until the following business day!  If a patient is unsure of the name of the medication(s) please note and ask patient to call back when they are able to provide all info, do not send to responsible party until all information is available!

## 2018-10-22 NOTE — Telephone Encounter (Signed)
Patient states she would like to get prescribed antibiotics for  BV states she has a boil in the crease of her leg and that she has the same symptoms as last time which include discharge. Please follow up

## 2018-10-23 MED ORDER — METRONIDAZOLE 500 MG PO TABS: 500.0000 mg | ORAL_TABLET | Freq: Two times a day (BID) | ORAL | 0 refills | Status: DC

## 2018-10-23 NOTE — Telephone Encounter (Signed)
Pt last seen for this issue was 03/21/2018. Will forward to pcp.

## 2018-10-24 ENCOUNTER — Other Ambulatory Visit: Payer: Self-pay

## 2018-10-24 DIAGNOSIS — M545 Low back pain, unspecified: Secondary | ICD-10-CM

## 2018-10-24 DIAGNOSIS — K219 Gastro-esophageal reflux disease without esophagitis: Secondary | ICD-10-CM

## 2018-10-24 DIAGNOSIS — M62830 Muscle spasm of back: Secondary | ICD-10-CM

## 2018-10-24 MED ORDER — OMEPRAZOLE 20 MG PO CPDR: 20.0000 mg | DELAYED_RELEASE_CAPSULE | Freq: Every day | ORAL | 2 refills | Status: DC

## 2018-10-24 MED ORDER — METRONIDAZOLE 500 MG PO TABS: 500.0000 mg | ORAL_TABLET | Freq: Two times a day (BID) | ORAL | 0 refills | Status: DC

## 2018-10-24 MED ORDER — CYCLOBENZAPRINE HCL 10 MG PO TABS: 10.0000 mg | ORAL_TABLET | Freq: Every day | ORAL | 2 refills | Status: DC | PRN

## 2018-10-24 MED FILL — CYCLOBENZAPRINE 10 MG TAB: 10 | 30 days supply | Qty: 30 | Fill #0

## 2018-10-24 MED FILL — metroNIDAZOLE 500 MG TABS: 500 | 7 days supply | Qty: 14 | Fill #0

## 2018-10-24 MED FILL — ?OMEPRAZOLE 20 MG CAPSULE D: 20 | 30 days supply | Qty: 30 | Fill #0

## 2018-10-24 NOTE — Telephone Encounter (Signed)
Contacted pt and made aware that rx was sent to [facility] pt requested for the rx be sent to [facility] resent rxs. Pt doesn't have any questions or concerns

## 2018-11-04 MED FILL — traZODone HCL 50 MG TABS: 50 | 30 days supply | Qty: 60 | Fill #1

## 2018-11-04 MED FILL — QUETIAPINE FUMARATE 50 MG T: 50 | 30 days supply | Qty: 30 | Fill #1

## 2018-11-05 ENCOUNTER — Ambulatory Visit (HOSPITAL_COMMUNITY): Payer: Self-pay | Admitting: Psychiatry

## 2018-11-14 ENCOUNTER — Other Ambulatory Visit: Payer: Self-pay

## 2018-11-14 ENCOUNTER — Encounter (HOSPITAL_COMMUNITY): Payer: Self-pay | Admitting: Psychiatry

## 2018-11-14 ENCOUNTER — Ambulatory Visit (INDEPENDENT_AMBULATORY_CARE_PROVIDER_SITE_OTHER): Payer: Self-pay | Admitting: Psychiatry

## 2018-11-14 DIAGNOSIS — F1210 Cannabis abuse, uncomplicated: Secondary | ICD-10-CM

## 2018-11-14 DIAGNOSIS — F4310 Post-traumatic stress disorder, unspecified: Secondary | ICD-10-CM

## 2018-11-14 DIAGNOSIS — F5105 Insomnia due to other mental disorder: Secondary | ICD-10-CM

## 2018-11-14 DIAGNOSIS — F319 Bipolar disorder, unspecified: Secondary | ICD-10-CM

## 2018-11-14 DIAGNOSIS — F99 Mental disorder, not otherwise specified: Secondary | ICD-10-CM

## 2018-11-14 MED ORDER — HYDROXYZINE HCL 50 MG PO TABS: 50.0000 mg | ORAL_TABLET | Freq: Two times a day (BID) | ORAL | 2 refills | Status: DC | PRN

## 2018-11-14 MED ORDER — LAMOTRIGINE ER 300 MG PO TB24: 300.0000 mg | ORAL_TABLET | Freq: Every day | ORAL | 2 refills | Status: DC

## 2018-11-14 MED ORDER — QUETIAPINE FUMARATE 100 MG PO TABS: 100.0000 mg | ORAL_TABLET | Freq: Every day | ORAL | 2 refills | Status: DC

## 2018-11-14 MED ORDER — TRAZODONE HCL 50 MG PO TABS: ORAL_TABLET | ORAL | 2 refills | Status: DC
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
## 2018-11-15 MED FILL — QUETIAPINE FUMARATE 100 MG: 100 | 30 days supply | Qty: 30 | Fill #0

## 2018-11-15 MED FILL — $LAMICTAL XR 300MG TABLET: 300 | 30 days supply | Qty: 30 | Fill #0

## 2018-11-22 MED FILL — CYCLOBENZAPRINE 10 MG TAB: 10 | 30 days supply | Qty: 30 | Fill #1

## 2018-12-10 MED FILL — ?OMEPRAZOLE 20 MG CAPSULE D: 20 | 30 days supply | Qty: 30 | Fill #1

## 2018-12-23 MED FILL — CYCLOBENZAPRINE 10 MG TAB: 10 | 30 days supply | Qty: 30 | Fill #2

## 2019-01-02 MED FILL — QUETIAPINE FUMARATE 100 MG: 100 | 30 days supply | Qty: 30 | Fill #1

## 2019-01-02 MED FILL — $LAMICTAL XR 300MG TABLET: 300 | 30 days supply | Qty: 30 | Fill #1

## 2019-01-02 MED FILL — ?OMEPRAZOLE 20 MG CAPSULE D: 20 | 30 days supply | Qty: 30 | Fill #2

## 2019-01-23 ENCOUNTER — Encounter (HOSPITAL_COMMUNITY): Payer: Self-pay

## 2019-01-23 ENCOUNTER — Telehealth: Payer: Self-pay | Admitting: Internal Medicine

## 2019-01-23 ENCOUNTER — Ambulatory Visit (HOSPITAL_COMMUNITY)
Admission: EM | Admit: 2019-01-23 | Discharge: 2019-01-23 | Disposition: A | Discharge status: Home / Self Care | Payer: Self-pay | Attending: Internal Medicine | Admitting: Internal Medicine

## 2019-01-23 ENCOUNTER — Other Ambulatory Visit: Payer: Self-pay

## 2019-01-23 DIAGNOSIS — S70262A Insect bite (nonvenomous), left hip, initial encounter: Secondary | ICD-10-CM

## 2019-01-23 DIAGNOSIS — W57XXXA Bitten or stung by nonvenomous insect and other nonvenomous arthropods, initial encounter: Secondary | ICD-10-CM

## 2019-01-23 MED ORDER — DOXYCYCLINE HYCLATE 100 MG PO CAPS: 100.0000 mg | ORAL_CAPSULE | Freq: Two times a day (BID) | ORAL | 0 refills | Status: DC

## 2019-01-23 NOTE — Telephone Encounter (Signed)
Pt called states she has a tic bite that has gotten big and red and hard in one day. Advised will forward to triage nurse for return call.

## 2019-01-23 NOTE — ED Triage Notes (Signed)
Pt presents with tick bite on left hip that she noticed a day ago.

## 2019-01-23 NOTE — Telephone Encounter (Signed)
1) Medication(s) Requested (by name): omeprazole & flexeril  2) Pharmacy of Choice: [facility]  3) Special Requests:   Approved medications will be sent to the pharmacy, we will reach out if there is an issue.  Requests made after 3pm may not be addressed until the following business day!  If a patient is unsure of the name of the medication(s) please note and ask patient to call back when they are able to provide all info, do not send to responsible party until all information is available!

## 2019-01-23 NOTE — Telephone Encounter (Signed)
Patients call returned.  Patient identified by name and date of birth.  Patient states tick bite she received yesterday has grown to three times the size is very red and swollen and hard.    Patient advised to go to Urgent Care or the Emergency Department to be assessed.  Patient acknowledged understanding of advice.

## 2019-01-23 NOTE — ED Provider Notes (Signed)
[facility]    CSN: [id number] Arrival date & time: 01/23/19  1819      History   Chief Complaint Chief Complaint  Patient presents with  . Tick Removal    HPI Katie Sherman is a 53 y.o. female comes to urgent care with history of a tick bite.  Patient noticed a tick on her left hip area yesterday when she was taking a shower. She removed the tick.  Tick was not engorged.  She is unsure how long the tick had been in place.  The head was not loose and some of the bug bites may have been left when she tried to take out.  No fever or chills.  Patient subsequently has developed erythema around the site of the bite.  HPI  Past Medical History:  Diagnosis Date  . Bilateral carpal tunnel syndrome 03/19/2018  . Cervical cancer ([place])   . Cervical cancer ([place])   . History of exercise stress test    ETT-Echo 5/18: normal EF, no ischemia  . History of loop recorder    currently on person  . Kidney stone     Patient Active Problem List   Diagnosis Date Noted  . Bilateral carpal tunnel syndrome 03/19/2018  . Mixed hyperlipidemia 11/14/2017  . 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 Postmenopausal 01/22/2017  . Bipolar 1 disorder, depressed ([place]) 12/29/2016  . Family history of suicide 11/20/2016  . Depression 11/20/2016  . Family history of breast cancer 11/20/2016  . Adhesive capsulitis of left shoulder 11/19/2016  . Greater trochanteric bursitis of right hip 11/19/2016    Past Surgical History:  Procedure Laterality Date  . CERVICAL CONE BIOPSY    . TUBAL LIGATION      OB History    Gravida  6   Para  5   Term  5   Preterm      AB  1   Living  5     SAB      TAB  1   Ectopic      Multiple      Live Births  5            Home Medications    Prior to Admission medications   Medication Sig Start Date End Date Taking? Authorizing Provider  cyclobenzaprine (FLEXERIL) 10 MG tablet Take 1 tablet (10 mg total) by mouth daily as  needed for muscle spasms. 10/24/18   Ladell Pier, MD  doxycycline (VIBRAMYCIN) 100 MG capsule Take 1 capsule (100 mg total) by mouth 2 (two) times daily. 01/23/19   LampteyMyrene Galas, MD  hydrOXYzine (ATARAX/VISTARIL) 50 MG tablet Take 1 tablet (50 mg total) by mouth 2 (two) times daily as needed. 11/14/18   Arfeen, Arlyce Harman, MD  LamoTRIgine (LAMICTAL XR) 300 MG TB24 24 hour tablet Take 1 tablet (300 mg total) by mouth daily at 8 pm. 11/14/18   Arfeen, Arlyce Harman, MD  metroNIDAZOLE (FLAGYL) 500 MG tablet Take 1 tablet (500 mg total) by mouth 2 (two) times daily. 10/24/18   Ladell Pier, MD  nitroGLYCERIN (NITRODUR - DOSED IN MG/24 HR) 0.2 mg/hr patch Use 1/4 patch daily to the affected area 11/13/17   Lake, Christoper P, MD  omeprazole (PRILOSEC) 20 MG capsule Take 1 capsule (20 mg total) by mouth daily. 10/24/18   Ladell Pier, MD  QUEtiapine (SEROQUEL) 100 MG tablet Take 1 tablet (100 mg total) by mouth at bedtime. 11/14/18   Berniece Andreas  T, MD  traZODone (DESYREL) 50 MG tablet Take 1-2 tab at bed time 11/14/18   Arfeen, Arlyce Harman, MD    Family History Family History  Problem Relation Age of Onset  . Heart attack Mother 28  . Heart disease Father   . Heart attack Father 25    Social History Social History   Tobacco Use  . Smoking status: Current Every Day Smoker    Packs/day: 1.00    Years: 36.00    Pack years: 36.00    Types: Cigarettes    Start date: 71  . Smokeless tobacco: Never Used  . Tobacco comment: 3 months without  Substance Use Topics  . Alcohol use: No  . Drug use: No    Types: Marijuana    Comment: stopped 04/06/2017     Allergies   Patient has no known allergies.   Review of Systems Review of Systems  Constitutional: Negative for activity change and appetite change.  HENT: Negative.   Respiratory: Negative for chest tightness, shortness of breath and wheezing.   Gastrointestinal: Negative for abdominal distention, abdominal pain, diarrhea, nausea and  vomiting.  Genitourinary: Negative.   Musculoskeletal: Negative for arthralgias, back pain, gait problem and myalgias.  Neurological: Negative for dizziness, weakness, numbness and headaches.     Physical Exam Triage Vital Signs ED Triage Vitals  Enc Vitals Group     BP 01/23/19 1903 115/61     Pulse Rate 01/23/19 1903 85     Resp 01/23/19 1903 17     Temp 01/23/19 1903 98.2 F (36.8 C)     Temp Source 01/23/19 1903 Oral     SpO2 01/23/19 1903 95 %     Weight --      Height --      Head Circumference --      Peak Flow --      Pain Score 01/23/19 1904 4     Pain Loc --      Pain Edu? --      Excl. in [place]? --    No data found.  Updated Vital Signs BP 115/61 (BP Location: Left Arm)   Pulse 85   Temp 98.2 F (36.8 C) (Oral)   Resp 17   LMP 04/29/2012   SpO2 95%   Visual Acuity Right Eye Distance:   Left Eye Distance:   Bilateral Distance:     eye Near:  eft Eye Near:    Bilateral Near:     Physical Exam Vitals signs and nursing note reviewed.  Constitutional:      General: She is not in acute distress.    Appearance: Normal appearance. She is not ill-appearing or toxic-appearing.  Cardiovascular:     Rate and Rhythm: Normal rate and regular rhythm.     Pulses: Normal pulses.     Heart sounds: Normal heart sounds.  Pulmonary:     Effort: Pulmonary effort is normal.     Breath sounds: Normal breath sounds.  Skin:    General: Skin is warm.     Capillary Refill: Capillary refill takes less than 2 seconds.     Findings: Erythema and lesion present. No bruising or rash.     Comments: Erythematous patch over the left hip.  Site of tick bite is easy to identify.  Erythematous patches oval with the longest diameter of about 3 inches.  Neurological:     General: No focal deficit present.     Mental Status: She is alert and oriented to  person, place, and time.    1.  Tick bite with surrounding erythema (duration of bite unknown): Doxycycline 100 mg twice daily for  10 days Patient was reassured that the retained mouth parts will migrate out with time Patient denies any fever, chills, myalgias or arthralgias at this time.  Serology for Lyme disease will not be helpful and will not change the management.  I will defer testing for serologies for tickborne illnesses at this time.  If patient develops symptoms in a few weeks then testing will be helpful. UC Treatments / Results  Labs (all labs ordered are listed, but only abnormal results are displayed) Labs Reviewed - No data to display  EKG   Radiology No results found.  Procedures Procedures (including critical care time)  Medications Ordered in UC Medications - No data to display  Initial Impression / Assessment and Plan / UC Course  I have reviewed the triage vital signs and the nursing notes.  Pertinent labs & imaging results that were available during my care of the patient were reviewed by me and considered in my medical decision making (see chart for details).     1.  Final Clinical Impressions(s) / UC Diagnoses   Final diagnoses:  Tick bite of left hip, initial encounter   Discharge Instructions   None    ED Prescriptions    Medication Sig Dispense Auth. Provider   doxycycline (VIBRAMYCIN) 100 MG capsule Take 1 capsule (100 mg total) by mouth 2 (two) times daily. 20 capsule Kamonte Mcmichen, Myrene Galas, MD     Controlled Substance Prescriptions [place] Controlled Substance Registry consulted? No   Chase Picket, MD 01/23/19 [phone]

## 2019-01-23 NOTE — Telephone Encounter (Signed)
Pt scheduled appt 02/17/19. Pt request refill meds until appt. Please advise.

## 2019-01-24 MED FILL — ?DOXYCYCLINE HYCL 100MG: 100 | 10 days supply | Qty: 20 | Fill #0

## 2019-02-04 MED FILL — QUETIAPINE FUMARATE 100 MG: 100 | 30 days supply | Qty: 30 | Fill #2

## 2019-02-04 MED FILL — $LAMICTAL XR 300MG TABLET: 300 | 30 days supply | Qty: 30 | Fill #2

## 2019-02-17 ENCOUNTER — Encounter (HOSPITAL_COMMUNITY): Payer: Self-pay | Admitting: Psychiatry

## 2019-02-17 ENCOUNTER — Ambulatory Visit (INDEPENDENT_AMBULATORY_CARE_PROVIDER_SITE_OTHER): Payer: Self-pay | Admitting: Psychiatry

## 2019-02-17 ENCOUNTER — Ambulatory Visit: Payer: Self-pay | Attending: Internal Medicine | Admitting: Internal Medicine

## 2019-02-17 ENCOUNTER — Telehealth (HOSPITAL_COMMUNITY): Payer: Self-pay | Admitting: Psychiatry

## 2019-02-17 ENCOUNTER — Other Ambulatory Visit: Payer: Self-pay

## 2019-02-17 DIAGNOSIS — F5105 Insomnia due to other mental disorder: Secondary | ICD-10-CM

## 2019-02-17 DIAGNOSIS — F4310 Post-traumatic stress disorder, unspecified: Secondary | ICD-10-CM

## 2019-02-17 DIAGNOSIS — F319 Bipolar disorder, unspecified: Secondary | ICD-10-CM

## 2019-02-17 DIAGNOSIS — F1210 Cannabis abuse, uncomplicated: Secondary | ICD-10-CM

## 2019-02-17 DIAGNOSIS — M25512 Pain in left shoulder: Secondary | ICD-10-CM

## 2019-02-17 DIAGNOSIS — Z1239 Encounter for other screening for malignant neoplasm of breast: Secondary | ICD-10-CM

## 2019-02-17 DIAGNOSIS — M62830 Muscle spasm of back: Secondary | ICD-10-CM

## 2019-02-17 DIAGNOSIS — E559 Vitamin D deficiency, unspecified: Secondary | ICD-10-CM

## 2019-02-17 DIAGNOSIS — Z1211 Encounter for screening for malignant neoplasm of colon: Secondary | ICD-10-CM

## 2019-02-17 DIAGNOSIS — E785 Hyperlipidemia, unspecified: Secondary | ICD-10-CM

## 2019-02-17 DIAGNOSIS — F99 Mental disorder, not otherwise specified: Secondary | ICD-10-CM

## 2019-02-17 DIAGNOSIS — K219 Gastro-esophageal reflux disease without esophagitis: Secondary | ICD-10-CM

## 2019-02-17 MED ORDER — QUETIAPINE FUMARATE 100 MG PO TABS: 100.0000 mg | ORAL_TABLET | Freq: Every day | ORAL | 2 refills | Status: DC

## 2019-02-17 MED ORDER — HYDROXYZINE HCL 50 MG PO TABS: 50.0000 mg | ORAL_TABLET | Freq: Two times a day (BID) | ORAL | 2 refills | Status: DC | PRN

## 2019-02-17 MED ORDER — LAMOTRIGINE ER 300 MG PO TB24: 300.0000 mg | ORAL_TABLET | Freq: Every day | ORAL | 2 refills | Status: DC

## 2019-02-17 MED ORDER — OMEPRAZOLE 20 MG PO CPDR: 20.0000 mg | DELAYED_RELEASE_CAPSULE | Freq: Every day | ORAL | 2 refills | Status: DC

## 2019-02-17 MED ORDER — CYCLOBENZAPRINE HCL 10 MG PO TABS: 10.0000 mg | ORAL_TABLET | Freq: Every day | ORAL | 2 refills | Status: DC | PRN

## 2019-02-17 MED ORDER — DICLOFENAC SODIUM 1 % TD GEL: 2.0000 g | Freq: Four times a day (QID) | TRANSDERMAL | 2 refills | Status: DC

## 2019-02-17 MED ORDER — TRAZODONE HCL 50 MG PO TABS: ORAL_TABLET | ORAL | 2 refills | Status: DC

## 2019-02-17 MED FILL — traZODone HCL 50 MG TABS: 50 | 15 days supply | Qty: 30 | Fill #0

## 2019-02-17 NOTE — Progress Notes (Signed)
Virtual Visit via Telephone Note Due to current restrictions/limitations of in-office visits due to the COVID-19 pandemic, this scheduled clinical appointment was converted to a telehealth visit  I connected with Katie Sherman on 02/17/19 at 4:26 p.m by telephone and verified that I am speaking with the correct person using two identifiers. I am in my office.  The patient is in her car.  Only the patient and myself participated in this encounter.  I discussed the limitations, risks, security and privacy concerns of performing an evaluation and management service by telephone and the availability of in person appointments. I also discussed with the patient that there may be a patient responsible charge related to this service. The patient expressed understanding and agreed to proceed.   History of Present Illness: Pt with hx of GERD, HL, bipolar 1, chronic LT shoulder and Rt hip pain.   Would like to have cholesterol check.  Was on Pravachol but quit taking earlier this yr. Limited finances  Hx of low vitamin D.  Would like to have level recheck  C/o flare pain LT shoulder again x 1.5 mth.  Problem raising it.  Dx with adhesive capsulitis in past.  She wonders whether she needs to have her hormone level checked.  States that a few years ago when she had similar problem with her shoulder and was seen in the emergency room, she had a false positive pregnancy test.  She is menopausal and has a tubal ligation.  Requests refills on several of her medications including Flexeril for back spasms, omeprazole for acid reflux.  HM: Due for colon cancer screening and mammogram.  Outpatient Encounter Medications as of 02/17/2019  Medication Sig  . cyclobenzaprine (FLEXERIL) 10 MG tablet Take 1 tablet (10 mg total) by mouth daily as needed for muscle spasms.  Marland Kitchen doxycycline (VIBRAMYCIN) 100 MG capsule Take 1 capsule (100 mg total) by mouth 2 (two) times daily. (Patient not taking: Reported on 02/17/2019)   . hydrOXYzine (ATARAX/VISTARIL) 50 MG tablet Take 1 tablet (50 mg total) by mouth 2 (two) times daily as needed.  . LamoTRIgine (LAMICTAL XR) 300 MG TB24 24 hour tablet Take 1 tablet (300 mg total) by mouth daily at 8 pm.  . metroNIDAZOLE (FLAGYL) 500 MG tablet Take 1 tablet (500 mg total) by mouth 2 (two) times daily. (Patient not taking: Reported on 02/17/2019)  . nitroGLYCERIN (NITRODUR - DOSED IN MG/24 HR) 0.2 mg/hr patch Use 1/4 patch daily to the affected area  . omeprazole (PRILOSEC) 20 MG capsule Take 1 capsule (20 mg total) by mouth daily.  . QUEtiapine (SEROQUEL) 100 MG tablet Take 1 tablet (100 mg total) by mouth at bedtime.  . traZODone (DESYREL) 50 MG tablet Take 1-2 tab at bed time   No facility-administered encounter medications on file as of 02/17/2019.     Observations/Objective: No direct observation done as this was a telephone encounter.  Assessment and Plan: 1. Acute pain of left shoulder I do not think we need this to do hormone levels as I do not feel left shoulder pain is related to her hormones.  Can refer to orthopedics when she is approved for the orange card/cone discount. - diclofenac sodium (VOLTAREN) 1 % GEL; Apply 2 g topically 4 (four) times daily.  Dispense: 100 g; Refill: 2  2. Hyperlipidemia, unspecified hyperlipidemia type - Lipid panel; Future  3. Vitamin D deficiency - VITAMIN D 25 Hydroxy (Vit-D Deficiency, Fractures); Future  4. Spasm of muscle of lower back - cyclobenzaprine (FLEXERIL)  10 MG tablet; Take 1 tablet (10 mg total) by mouth daily as needed for muscle spasms.  Dispense: 30 tablet; Refill: 2  5. Gastroesophageal reflux disease without esophagitis - omeprazole (PRILOSEC) 20 MG capsule; Take 1 capsule (20 mg total) by mouth daily.  Dispense: 30 capsule; Refill: 2  6. Screening for colon cancer - Fecal occult blood, imunochemical([facility]); Future  7. Breast cancer screening When she comes to get her blood test done, she can  get forms from my CMA for the [facility] program   Follow Up Instructions: 3 mths   I discussed the assessment and treatment plan with the patient. The patient was provided an opportunity to ask questions and all were answered. The patient agreed with the plan and demonstrated an understanding of the instructions.   The patient was advised to call back or seek an in-person evaluation if the symptoms worsen or if the condition fails to improve as anticipated.  I provided 18 minutes of non-face-to-face time during this encounter.   Karle Plumber, MD
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
## 2019-02-17 NOTE — Telephone Encounter (Signed)
02/17/19 4:24pm Spoke with patient during the time giving her f/u appt with provider - the patient stated that she needed to make an appt to re file again for [place] and she not sure when can make an appt.  It was explained to the patient that she will be self-pay until she can get assistance again.

## 2019-02-18 ENCOUNTER — Ambulatory Visit: Payer: Self-pay | Attending: Internal Medicine

## 2019-02-18 ENCOUNTER — Other Ambulatory Visit: Payer: Self-pay

## 2019-02-18 DIAGNOSIS — E559 Vitamin D deficiency, unspecified: Secondary | ICD-10-CM

## 2019-02-18 DIAGNOSIS — E785 Hyperlipidemia, unspecified: Secondary | ICD-10-CM

## 2019-02-18 MED FILL — CYCLOBENZAPRINE 10 MG TAB: 10 | 30 days supply | Qty: 30 | Fill #0

## 2019-02-18 MED FILL — ?OMEPRAZOLE 20MG CAP DR: 20 | 30 days supply | Qty: 30 | Fill #0

## 2019-02-18 MED FILL — DICLOFENAC SODIUM 1% GEL: 1 | 12 days supply | Qty: 100 | Fill #0

## 2019-02-19 ENCOUNTER — Encounter: Payer: Self-pay | Admitting: Internal Medicine

## 2019-02-19 DIAGNOSIS — E559 Vitamin D deficiency, unspecified: Secondary | ICD-10-CM | POA: Insufficient documentation

## 2019-02-19 LAB — VITAMIN D 25 HYDROXY (VIT D DEFICIENCY, FRACTURES): Vit D, 25-Hydroxy: 33.7 ng/mL (ref 30.0–100.0)

## 2019-02-19 LAB — LIPID PANEL
Chol/HDL Ratio: 4.5 ratio — ABNORMAL HIGH (ref 0.0–4.4)
Cholesterol, Total: 206 mg/dL — ABNORMAL HIGH (ref 100–199)
HDL: 46 mg/dL (ref >39)
LDL Calculated: 137 mg/dL — ABNORMAL HIGH (ref 0–99)
Triglycerides: 114 mg/dL (ref 0–149)
VLDL Cholesterol Cal: 23 mg/dL (ref 5–40)

## 2019-03-06 MED FILL — $LAMICTAL XR 300MG TABLET: 300 | 30 days supply | Qty: 30 | Fill #0

## 2019-03-06 MED FILL — QUETIAPINE FUMARATE 100 MG: 100 | 30 days supply | Qty: 30 | Fill #0

## 2019-03-18 MED FILL — traZODone HCL 50 MG TABS: 50 | 15 days supply | Qty: 30 | Fill #1

## 2019-03-18 MED FILL — CYCLOBENZAPRINE 10 MG TAB: 10 | 30 days supply | Qty: 30 | Fill #1

## 2019-03-18 MED FILL — ?OMEPRAZOLE 20MG CAP DR: 20 | 30 days supply | Qty: 30 | Fill #1

## 2019-03-31 ENCOUNTER — Telehealth: Payer: Self-pay | Admitting: Internal Medicine

## 2019-03-31 NOTE — Telephone Encounter (Signed)
Patient came in wanting to know if she could be prescribed antibiotics for a abscess in her tooth and would like to get ibuprofen 800 mg. For the pain. Please follow up.

## 2019-04-04 ENCOUNTER — Other Ambulatory Visit: Payer: Self-pay

## 2019-04-04 ENCOUNTER — Encounter: Payer: Self-pay | Admitting: Internal Medicine

## 2019-04-04 ENCOUNTER — Ambulatory Visit: Payer: Self-pay | Attending: Internal Medicine | Admitting: Internal Medicine

## 2019-04-04 DIAGNOSIS — K047 Periapical abscess without sinus: Secondary | ICD-10-CM

## 2019-04-04 MED ORDER — IBUPROFEN 600 MG PO TABS: 600.0000 mg | ORAL_TABLET | Freq: Three times a day (TID) | ORAL | 0 refills | Status: DC | PRN

## 2019-04-04 MED ORDER — PENICILLIN V POTASSIUM 500 MG PO TABS: 500.0000 mg | ORAL_TABLET | Freq: Four times a day (QID) | ORAL | 0 refills | Status: DC

## 2019-04-04 MED FILL — $LAMICTAL XR 300MG TABLET: 300 | 30 days supply | Qty: 30 | Fill #1

## 2019-04-04 MED FILL — QUETIAPINE FUMARATE 100 MG: 100 | 30 days supply | Qty: 30 | Fill #1

## 2019-04-04 MED FILL — ?PENICILLIN VK 500 MG TABLE: 500 | 7 days supply | Qty: 28 | Fill #0

## 2019-04-04 MED FILL — ?IBUPROFEN 600 MG TABS: 600 | 10 days supply | Qty: 30 | Fill #0

## 2019-04-04 NOTE — Progress Notes (Signed)
Patient verified DOB Patient has not taken medication today. patient has not eaten today. Patient complains of pain scaled at a 7. Pain is in her mouth at the top on the right side. Pain began a few days ago.

## 2019-04-04 NOTE — Progress Notes (Signed)
Virtual Visit via Telephone Note Due to current restrictions/limitations of in-office visits due to the COVID-19 pandemic, this scheduled clinical appointment was converted to a telehealth visit  I connected with Katie Sherman on 04/04/19 at 1:42 p.m by telephone and verified that I am speaking with the correct person using two identifiers. I am in my office.  The patient is at home.  Only the patient and myself participated in this encounter.  I discussed the limitations, risks, security and privacy concerns of performing an evaluation and management service by telephone and the availability of in person appointments. I also discussed with the patient that there may be a patient responsible charge related to this service. The patient expressed understanding and agreed to proceed.   History of Present Illness: Pt with hx of GERD, HL, bipolar 1, chronic LT shoulder and Rt hip pain.    Pt c/o having an abscess tooth.  RT side upper jaw.  Endorses swelling and redness of the gum.  Mild swelling of face. Slight redness.  No fever.  No problems swallowing -requesting Abx and Ibuprofen Does not have dental appt.   Her orange card/cone discount card has expired.  Observations/Objective: No direct observation done as this was a telephone encounter  Assessment and Plan: 1. Dental abscess Told patient that I will send prescription to the pharmacy for antibiotics and ibuprofen.  However if this gets worse she needs to be seen in the emergency room.  She will reapply for the orange card/cone discount so that she can be referred to the dentist. - penicillin v potassium (VEETID) 500 MG tablet; Take 1 tablet (500 mg total) by mouth 4 (four) times daily.  Dispense: 28 tablet; Refill: 0 - ibuprofen (ADVIL) 600 MG tablet; Take 1 tablet (600 mg total) by mouth every 8 (eight) hours as needed.  Dispense: 30 tablet; Refill: 0   Follow Up Instructions: PRN   I discussed the assessment and treatment plan  with the patient. The patient was provided an opportunity to ask questions and all were answered. The patient agreed with the plan and demonstrated an understanding of the instructions.   The patient was advised to call back or seek an in-person evaluation if the symptoms worsen or if the condition fails to improve as anticipated.  I provided 5 minutes of non-face-to-face time during this encounter.   Karle Plumber, MD

## 2019-04-14 MED FILL — CYCLOBENZAPRINE 10 MG TAB: 10 | 30 days supply | Qty: 30 | Fill #2

## 2019-04-16 MED FILL — traZODone HCL 50 MG TABS: 50 | 15 days supply | Qty: 30 | Fill #2

## 2019-04-16 MED FILL — ?OMEPRAZOLE 20MG CAP DR: 20 | 30 days supply | Qty: 30 | Fill #2

## 2019-05-06 MED FILL — QUETIAPINE FUMARATE 100 MG: 100 | 30 days supply | Qty: 30 | Fill #2

## 2019-05-06 MED FILL — $LAMICTAL XR 300MG TABLET: 300 | 30 days supply | Qty: 30 | Fill #2

## 2019-05-12 ENCOUNTER — Other Ambulatory Visit (HOSPITAL_COMMUNITY): Payer: Self-pay

## 2019-05-12 DIAGNOSIS — F4310 Post-traumatic stress disorder, unspecified: Secondary | ICD-10-CM

## 2019-05-12 DIAGNOSIS — F5105 Insomnia due to other mental disorder: Secondary | ICD-10-CM

## 2019-05-12 DIAGNOSIS — F99 Mental disorder, not otherwise specified: Secondary | ICD-10-CM

## 2019-05-12 MED ORDER — TRAZODONE HCL 50 MG PO TABS: ORAL_TABLET | ORAL | 0 refills | Status: DC

## 2019-05-12 MED FILL — traZODone HCL 50 MG TABS: 50 | 15 days supply | Qty: 30 | Fill #0

## 2019-05-20 ENCOUNTER — Other Ambulatory Visit: Payer: Self-pay

## 2019-05-20 ENCOUNTER — Encounter (HOSPITAL_COMMUNITY): Payer: Self-pay | Admitting: Psychiatry

## 2019-05-20 ENCOUNTER — Ambulatory Visit (INDEPENDENT_AMBULATORY_CARE_PROVIDER_SITE_OTHER): Payer: Self-pay | Admitting: Psychiatry

## 2019-05-20 DIAGNOSIS — F1210 Cannabis abuse, uncomplicated: Secondary | ICD-10-CM

## 2019-05-20 DIAGNOSIS — F4310 Post-traumatic stress disorder, unspecified: Secondary | ICD-10-CM

## 2019-05-20 DIAGNOSIS — F319 Bipolar disorder, unspecified: Secondary | ICD-10-CM

## 2019-05-20 MED ORDER — LAMOTRIGINE ER 300 MG PO TB24: 300.0000 mg | ORAL_TABLET | Freq: Every day | ORAL | 1 refills | Status: DC

## 2019-05-20 MED ORDER — QUETIAPINE FUMARATE 200 MG PO TABS: 200.0000 mg | ORAL_TABLET | Freq: Every day | ORAL | 1 refills | Status: DC

## 2019-05-20 MED FILL — QUETIAPINE FUMARATE 200 MG: 200 | 30 days supply | Qty: 30 | Fill #0
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
## 2019-05-22 ENCOUNTER — Other Ambulatory Visit: Payer: Self-pay | Admitting: Internal Medicine

## 2019-05-22 DIAGNOSIS — M62830 Muscle spasm of back: Secondary | ICD-10-CM

## 2019-05-22 DIAGNOSIS — K219 Gastro-esophageal reflux disease without esophagitis: Secondary | ICD-10-CM

## 2019-05-22 MED FILL — CYCLOBENZAPRINE 10 MG TAB: 10 | 30 days supply | Qty: 30 | Fill #0

## 2019-05-22 MED FILL — ?OMEPRAZOLE 20MG CAP DR: 20 | 30 days supply | Qty: 30 | Fill #0

## 2019-06-23 MED FILL — ?OMEPRazole 20mg CPDR: 20 | 30 days supply | Qty: 30 | Fill #1

## 2019-06-25 ENCOUNTER — Other Ambulatory Visit: Payer: Self-pay | Admitting: Internal Medicine

## 2019-06-25 DIAGNOSIS — M62830 Muscle spasm of back: Secondary | ICD-10-CM

## 2019-06-25 MED FILL — CYCLOBENZAPRINE 10 MG TAB: 10 | 30 days supply | Qty: 30 | Fill #0

## 2019-07-01 MED FILL — QUETIAPINE FUMARATE 200 MG: 200 | 30 days supply | Qty: 30 | Fill #1

## 2019-07-07 MED FILL — $LAMICTAL XR 300MG TABLET: 300 | 30 days supply | Qty: 30 | Fill #1

## 2019-07-17 ENCOUNTER — Other Ambulatory Visit: Payer: Self-pay

## 2019-07-17 ENCOUNTER — Ambulatory Visit (INDEPENDENT_AMBULATORY_CARE_PROVIDER_SITE_OTHER): Payer: Self-pay | Admitting: Psychiatry

## 2019-07-17 ENCOUNTER — Encounter (HOSPITAL_COMMUNITY): Payer: Self-pay | Admitting: Psychiatry

## 2019-07-17 ENCOUNTER — Telehealth (HOSPITAL_COMMUNITY): Payer: Self-pay | Admitting: Psychiatry

## 2019-07-17 DIAGNOSIS — F5105 Insomnia due to other mental disorder: Secondary | ICD-10-CM

## 2019-07-17 DIAGNOSIS — F319 Bipolar disorder, unspecified: Secondary | ICD-10-CM

## 2019-07-17 DIAGNOSIS — F99 Mental disorder, not otherwise specified: Secondary | ICD-10-CM

## 2019-07-17 DIAGNOSIS — F4310 Post-traumatic stress disorder, unspecified: Secondary | ICD-10-CM

## 2019-07-17 MED ORDER — TRAZODONE HCL 50 MG PO TABS: ORAL_TABLET | ORAL | 1 refills | Status: DC

## 2019-07-17 MED ORDER — QUETIAPINE FUMARATE 200 MG PO TABS: 200.0000 mg | ORAL_TABLET | Freq: Every day | ORAL | 1 refills | Status: DC

## 2019-07-17 MED ORDER — LAMOTRIGINE ER 300 MG PO TB24: 300.0000 mg | ORAL_TABLET | Freq: Every day | ORAL | 1 refills | Status: DC
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
## 2019-07-18 MED FILL — QUETIAPINE FUMARATE 200 MG: 200 | 30 days supply | Qty: 30 | Fill #0

## 2019-07-18 MED FILL — $LAMICTAL XR 300MG TABLET: 300 | 30 days supply | Qty: 30 | Fill #0

## 2019-07-18 MED FILL — traZODone HCL 50 MG TABS: 50 | 15 days supply | Qty: 30 | Fill #0

## 2019-07-23 ENCOUNTER — Telehealth (HOSPITAL_COMMUNITY): Payer: Self-pay | Admitting: Psychiatry

## 2019-07-28 MED FILL — ?OMEPRAZOLE 20MG CAP DR: 20 | 30 days supply | Qty: 30 | Fill #2

## 2019-08-27 ENCOUNTER — Other Ambulatory Visit: Payer: Self-pay | Admitting: Internal Medicine

## 2019-08-27 DIAGNOSIS — M62830 Muscle spasm of back: Secondary | ICD-10-CM

## 2019-08-27 DIAGNOSIS — K219 Gastro-esophageal reflux disease without esophagitis: Secondary | ICD-10-CM

## 2019-08-27 MED FILL — CYCLOBENZAPRINE 10 MG TAB: 10 | 30 days supply | Qty: 30 | Fill #0

## 2019-08-27 MED FILL — ?OMEPRAZOLE 20MG CAP DR: 20 | 30 days supply | Qty: 30 | Fill #0

## 2019-09-01 MED FILL — ?TRAZODONE HCL 50 TABS: 50 | 15 days supply | Qty: 30 | Fill #1

## 2019-09-08 MED FILL — $LAMICTAL XR 300MG TABLET: 300 | 30 days supply | Qty: 30 | Fill #1

## 2019-09-10 ENCOUNTER — Encounter (HOSPITAL_COMMUNITY): Payer: Self-pay | Admitting: Psychiatry

## 2019-09-10 ENCOUNTER — Other Ambulatory Visit: Payer: Self-pay

## 2019-09-10 ENCOUNTER — Ambulatory Visit (INDEPENDENT_AMBULATORY_CARE_PROVIDER_SITE_OTHER): Payer: Self-pay | Admitting: Psychiatry

## 2019-09-10 DIAGNOSIS — F1210 Cannabis abuse, uncomplicated: Secondary | ICD-10-CM

## 2019-09-10 DIAGNOSIS — F319 Bipolar disorder, unspecified: Secondary | ICD-10-CM

## 2019-09-10 DIAGNOSIS — F4310 Post-traumatic stress disorder, unspecified: Secondary | ICD-10-CM

## 2019-09-10 MED ORDER — LAMOTRIGINE 150 MG PO TABS: 300.0000 mg | ORAL_TABLET | Freq: Every day | ORAL | 1 refills | Status: DC

## 2019-09-10 MED ORDER — TRAZODONE HCL 50 MG PO TABS: ORAL_TABLET | ORAL | 1 refills | Status: DC

## 2019-09-10 MED ORDER — QUETIAPINE FUMARATE 200 MG PO TABS: 200.0000 mg | ORAL_TABLET | Freq: Every day | ORAL | 1 refills | Status: DC

## 2019-09-10 MED FILL — QUETIAPINE FUMARATE 200 MG: 200 | 30 days supply | Qty: 30 | Fill #0
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
## 2019-09-29 ENCOUNTER — Other Ambulatory Visit: Payer: Self-pay | Admitting: Internal Medicine

## 2019-09-29 DIAGNOSIS — M62830 Muscle spasm of back: Secondary | ICD-10-CM

## 2019-09-29 DIAGNOSIS — K219 Gastro-esophageal reflux disease without esophagitis: Secondary | ICD-10-CM

## 2019-09-29 MED FILL — lamoTRIgine 150 MG TABS: 150 | 30 days supply | Qty: 60 | Fill #0

## 2019-09-29 MED FILL — ?TRAZODONE HCL 50 TABS: 50 | 15 days supply | Qty: 30 | Fill #0

## 2019-10-01 ENCOUNTER — Other Ambulatory Visit: Payer: Self-pay

## 2019-10-01 ENCOUNTER — Ambulatory Visit: Payer: Self-pay | Attending: Internal Medicine | Admitting: Physician Assistant

## 2019-10-01 DIAGNOSIS — K219 Gastro-esophageal reflux disease without esophagitis: Secondary | ICD-10-CM

## 2019-10-01 DIAGNOSIS — M62830 Muscle spasm of back: Secondary | ICD-10-CM

## 2019-10-01 MED ORDER — CYCLOBENZAPRINE HCL 10 MG PO TABS: 10.0000 mg | ORAL_TABLET | Freq: Every day | ORAL | 2 refills | Status: DC | PRN

## 2019-10-01 MED ORDER — OMEPRAZOLE 20 MG PO CPDR: 20.0000 mg | DELAYED_RELEASE_CAPSULE | Freq: Every day | ORAL | 3 refills | Status: DC

## 2019-10-01 MED FILL — CYCLOBENZAPRINE 10 MG TAB: 10 | 30 days supply | Qty: 30 | Fill #0

## 2019-10-01 MED FILL — ?OMEPRAZOLE 20 MG CAP DR: 20 | 30 days supply | Qty: 30 | Fill #0

## 2019-10-01 NOTE — Progress Notes (Signed)
Virtual Visit via Telephone Note  I connected with Katie Sherman on 10/01/19 at 10:10 AM EDT by telephone and verified that I am speaking with the correct person using two identifiers.   I discussed the limitations, risks, security and privacy concerns of performing an evaluation and management service by telephone and the availability of in person appointments. I also discussed with the patient that there may be a patient responsible charge related to this service. The patient expressed understanding and agreed to proceed.  PATIENT visit by telephone virtually in the context of Covid-19 pandemic. Patient location:  home My Location:  [place] Persons on the call:  Me and the patient  History of Present Illness:  Back pain.  Chronic and ongoing in the lower back.  Needs RF flexeril.  Also needs the omeprazole RF.  Last labs 02/2019.  No new issues or concerns.  No urinary s/sx.      Observations/Objective:  NAD.  A&Ox3   Assessment and Plan: 1. Spasm of muscle of lower back - cyclobenzaprine (FLEXERIL) 10 MG tablet; Take 1 tablet (10 mg total) by mouth daily as needed for muscle spasms.  Dispense: 30 tablet; Refill: 2  2. Gastroesophageal reflux disease without esophagitis - omeprazole (PRILOSEC) 20 MG capsule; Take 1 capsule (20 mg total) by mouth daily.  Dispense: 30 capsule; Refill: 3    Follow Up Instructions: See PCP in 2-3 months   I discussed the assessment and treatment plan with the patient. The patient was provided an opportunity to ask questions and all were answered. The patient agreed with the plan and demonstrated an understanding of the instructions.   The patient was advised to call back or seek an in-person evaluation if the symptoms worsen or if the condition fails to improve as anticipated.  I provided 9 minutes of non-face-to-face time during this encounter.   Freeman Caldron, PA-C  Patient ID: Katie Sherman, female   DOB: 09-02-1965, 54 y.o.   MRN:  [id number]

## 2019-10-21 MED FILL — QUETIAPINE FUMARATE 200 MG: 200 | 30 days supply | Qty: 30 | Fill #1

## 2019-10-28 ENCOUNTER — Telehealth (HOSPITAL_COMMUNITY): Payer: Self-pay

## 2019-10-28 DIAGNOSIS — F319 Bipolar disorder, unspecified: Secondary | ICD-10-CM

## 2019-10-28 MED ORDER — LAMOTRIGINE 100 MG PO TABS: 300.0000 mg | ORAL_TABLET | Freq: Every day | ORAL | 0 refills | Status: DC

## 2019-10-28 MED FILL — CYCLOBENZAPRINE 10 MG TAB: 10 | 30 days supply | Qty: 30 | Fill #1

## 2019-10-28 MED FILL — ?OMEPRAZOLE 20 MG CAP DR: 20 | 30 days supply | Qty: 30 | Fill #1

## 2019-10-28 MED FILL — lamoTRIgine 100 MG TABS: 100 | 30 days supply | Qty: 90 | Fill #0

## 2019-10-28 MED FILL — ?TRAZODONE HCL 50 TABS: 50 | 15 days supply | Qty: 30 | Fill #1

## 2019-10-28 NOTE — Telephone Encounter (Signed)
Please call her pharmacy to switch 100 mg tablet and she can take 1-1/2 tablet daily to make it 150.

## 2019-10-28 NOTE — Telephone Encounter (Signed)
Yes that is fine

## 2019-10-28 NOTE — Telephone Encounter (Signed)
Pt called in regards to her Lamictal. Pt states the 150mg  tablets are too difficult for her to swallow. Requesting possible change to 100mg  tablets as pt states this was easier for her to swallow in the past. Please advise.

## 2019-11-25 ENCOUNTER — Telehealth (HOSPITAL_COMMUNITY): Payer: Self-pay

## 2019-11-25 MED FILL — CYCLOBENZAPRINE 10 MG TAB: 10 | 30 days supply | Qty: 30 | Fill #2

## 2019-11-25 NOTE — Telephone Encounter (Signed)
Patient called requesting a refill on her Trazodone 50mg  and her Quetiapine 200mg . She stated she's completely out and has followup scheduled 12/10/19. Thank you.

## 2019-11-26 ENCOUNTER — Other Ambulatory Visit (HOSPITAL_COMMUNITY): Payer: Self-pay | Admitting: *Deleted

## 2019-11-26 DIAGNOSIS — F319 Bipolar disorder, unspecified: Secondary | ICD-10-CM

## 2019-11-26 DIAGNOSIS — F4310 Post-traumatic stress disorder, unspecified: Secondary | ICD-10-CM

## 2019-11-26 MED ORDER — TRAZODONE HCL 50 MG PO TABS: ORAL_TABLET | ORAL | 1 refills | Status: DC

## 2019-11-26 MED ORDER — QUETIAPINE FUMARATE 200 MG PO TABS: 200.0000 mg | ORAL_TABLET | Freq: Every day | ORAL | 1 refills | Status: DC

## 2019-11-26 MED ORDER — LAMOTRIGINE 100 MG PO TABS: 300.0000 mg | ORAL_TABLET | Freq: Every day | ORAL | 0 refills | Status: DC

## 2019-11-27 MED FILL — ?OMEPRAZOLE 20 MG CAP DR: 20 | 30 days supply | Qty: 30 | Fill #2

## 2019-11-27 MED FILL — lamoTRIgine 100 MG TABS: 100 | 30 days supply | Qty: 90 | Fill #0

## 2019-11-27 MED FILL — QUETIAPINE FUMARATE 200 MG: 200 | 30 days supply | Qty: 30 | Fill #0

## 2019-11-27 MED FILL — ?TRAZODONE HCL 50 TABS: 50 | 15 days supply | Qty: 30 | Fill #0

## 2019-12-04 ENCOUNTER — Telehealth (INDEPENDENT_AMBULATORY_CARE_PROVIDER_SITE_OTHER): Payer: Self-pay | Admitting: Psychiatry

## 2019-12-04 ENCOUNTER — Encounter (HOSPITAL_COMMUNITY): Payer: Self-pay | Admitting: Psychiatry

## 2019-12-04 ENCOUNTER — Other Ambulatory Visit: Payer: Self-pay

## 2019-12-04 DIAGNOSIS — F319 Bipolar disorder, unspecified: Secondary | ICD-10-CM

## 2019-12-04 DIAGNOSIS — F4310 Post-traumatic stress disorder, unspecified: Secondary | ICD-10-CM

## 2019-12-04 DIAGNOSIS — F1210 Cannabis abuse, uncomplicated: Secondary | ICD-10-CM

## 2019-12-04 MED ORDER — LAMOTRIGINE 100 MG PO TABS: 300.0000 mg | ORAL_TABLET | Freq: Every day | ORAL | 2 refills | Status: DC

## 2019-12-04 MED ORDER — QUETIAPINE FUMARATE 300 MG PO TABS: 300.0000 mg | ORAL_TABLET | Freq: Every day | ORAL | 2 refills | Status: DC
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
## 2019-12-05 MED FILL — QUETIAPINE FUMARATE 300 MG: 300 | 30 days supply | Qty: 30 | Fill #0

## 2019-12-10 ENCOUNTER — Ambulatory Visit (HOSPITAL_COMMUNITY): Payer: Self-pay | Admitting: Psychiatry

## 2019-12-30 MED FILL — ?OMEPRAZOLE 20 MG CAP DR: 20 | 30 days supply | Qty: 30 | Fill #3

## 2019-12-30 MED FILL — ?TRAZODONE HCL 50 TABS: 50 | 15 days supply | Qty: 30 | Fill #1

## 2019-12-30 MED FILL — lamoTRIgine 100 MG TABS: 100 | 30 days supply | Qty: 90 | Fill #0

## 2020-01-01 ENCOUNTER — Other Ambulatory Visit: Payer: Self-pay

## 2020-01-01 ENCOUNTER — Ambulatory Visit: Payer: Self-pay | Attending: Internal Medicine | Admitting: Internal Medicine

## 2020-01-01 ENCOUNTER — Encounter: Payer: Self-pay | Admitting: Internal Medicine

## 2020-01-01 VITALS — BP 126/84 | HR 87 | Temp 98.2°F | Resp 16 | Wt 184.6 lb

## 2020-01-01 DIAGNOSIS — F17200 Nicotine dependence, unspecified, uncomplicated: Secondary | ICD-10-CM

## 2020-01-01 DIAGNOSIS — R112 Nausea with vomiting, unspecified: Secondary | ICD-10-CM

## 2020-01-01 DIAGNOSIS — Z1231 Encounter for screening mammogram for malignant neoplasm of breast: Secondary | ICD-10-CM

## 2020-01-01 DIAGNOSIS — N898 Other specified noninflammatory disorders of vagina: Secondary | ICD-10-CM

## 2020-01-01 DIAGNOSIS — Z114 Encounter for screening for human immunodeficiency virus [HIV]: Secondary | ICD-10-CM

## 2020-01-01 DIAGNOSIS — R634 Abnormal weight loss: Secondary | ICD-10-CM

## 2020-01-01 DIAGNOSIS — Z1159 Encounter for screening for other viral diseases: Secondary | ICD-10-CM

## 2020-01-01 DIAGNOSIS — W57XXXA Bitten or stung by nonvenomous insect and other nonvenomous arthropods, initial encounter: Secondary | ICD-10-CM

## 2020-01-01 DIAGNOSIS — R042 Hemoptysis: Secondary | ICD-10-CM

## 2020-01-01 DIAGNOSIS — Z1211 Encounter for screening for malignant neoplasm of colon: Secondary | ICD-10-CM

## 2020-01-01 DIAGNOSIS — R053 Chronic cough: Secondary | ICD-10-CM

## 2020-01-01 DIAGNOSIS — R05 Cough: Secondary | ICD-10-CM

## 2020-01-01 NOTE — Patient Instructions (Addendum)
Please go over to the radiology department at [facility] to have the chest x-ray done.  If the chest x-ray is normal then the next step would be for [place] to do a CAT scan of the chest.  Please apply for the orange card/cone discount card so that we can refer you for your colonoscopy on your next visit.   Please call the [facility]CP (breast and cervical cancer control program) at [phone] to schedule diagnostic mammogram

## 2020-01-01 NOTE — Progress Notes (Signed)
Patient ID: Katie Sherman, female    DOB: May 28, 1966  MRN: [id number]  CC:  Chronic ds management  Subjective: Katie Sherman is a 54 y.o. female who presents for chronic disease management Her concerns today include:  Pt with hx of GERD, HL, bipolar 1, chronic LT shoulder and Rt hip pain.   C/o having a red spot on RT ankle Was walking in [facility] 5 days ago and felt a sting.  She scratched it and woke up the next morning with a blister. Blister ripped 2 nights ago on fibric of couch Thinks it was a mosquito bite or blister bug/beatle. Read on line about blister bug Currently it does not itch, not painful.  There is no drainage.  Pt is postmenopausal. Had episode of vaginal bleeding 1 mth ago post intercourse.  Noticed it only when she wiped post intercourse. Past hx of Chlamydia in 2019 -since then noticed boil b/w gluteal fold that resolved.   -concern about general dischg that she has had x 1.5 wks.  White to yellow in color. -suspect her BF is sleeping around  For past 3 wks gets intermittent episodes of nauseous, sometimes with vomitting.  Episodes would last about 5 mins.  No initiating factors.  Not really associated with food. loss15 lbs in past 6 wks.  Reports decreased appetite and can sometimes go 3 to 4 days without eating much.  C/o having productive cough since February About 1 wk ago she cough up bright and dark blood. About 1 tsp.  Coughing up mucus. Smokes 1 pk/day since age 18.  Quit for 2 yrs one time and for 6 mths another time.  She was also smoking a Delta 8 pen.  Not ready to quit -no drainage at back of throat or allergiy symptoms Takes Omeprazole and GERD symptoms are controlled.  Patient Active Problem List   Diagnosis Date Noted  . Vitamin D deficiency 02/19/2019  . Bilateral carpal tunnel syndrome 03/19/2018  . Mixed hyperlipidemia 11/14/2017  . 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 Postmenopausal 01/22/2017  . Bipolar 1  disorder, depressed ([place]) 12/29/2016  . Family history of suicide 11/20/2016  . Depression 11/20/2016  . Family history of breast cancer 11/20/2016  . Adhesive capsulitis of left shoulder 11/19/2016  . Greater trochanteric bursitis of right hip 11/19/2016     Current Outpatient Medications on File Prior to Visit  Medication Sig Dispense Refill  . cyclobenzaprine (FLEXERIL) 10 MG tablet Take 1 tablet (10 mg total) by mouth daily as needed for muscle spasms. 30 tablet 2  . diclofenac sodium (VOLTAREN) 1 % GEL Apply 2 g topically 4 (four) times daily. 100 g 2  . ibuprofen (ADVIL) 600 MG tablet Take 1 tablet (600 mg total) by mouth every 8 (eight) hours as needed. 30 tablet 0  . lamoTRIgine (LAMICTAL) 100 MG tablet Take 3 tablets (300 mg total) by mouth daily. 90 tablet 2  . nitroGLYCERIN (NITRODUR - DOSED IN MG/24 HR) 0.2 mg/hr patch Use 1/4 patch daily to the affected area 30 patch 1  . omeprazole (PRILOSEC) 20 MG capsule Take 1 capsule (20 mg total) by mouth daily. 30 capsule 3  . penicillin v potassium (VEETID) 500 MG tablet Take 1 tablet (500 mg total) by mouth 4 (four) times daily. 28 tablet 0  . QUEtiapine (SEROQUEL) 300 MG tablet Take 1 tablet (300 mg total) by mouth at bedtime. 30 tablet 2  . traZODone (DESYREL) 50 MG tablet Take 1-2 tab  at bed time 30 tablet 1   No current facility-administered medications on file prior to visit.    No Known Allergies  Social History   Socioeconomic History  . Marital status: Widowed    Spouse name: Not on file  . Number of children: Not on file  . Years of education: Not on file  . Highest education level: Not on file  Occupational History  . Occupation: unemployed  Tobacco Use  . Smoking status: Current Every Day Smoker    Packs/day: 1.00    Years: 36.00    Pack years: 36.00    Types: Cigarettes    Start date: 37  . Smokeless tobacco: Never Used  . Tobacco comment: 3 months without  Vaping Use  . Vaping Use: Never used    Substance and Sexual Activity  . Alcohol use: No  . Drug use: No    Types: Marijuana    Comment: stopped 04/06/2017  . Sexual activity: Not Currently    Birth control/protection: None  Other Topics Concern  . Not on file  Social History Narrative   Prior traffic control specialist - unemployed now   Widow   5 children, 10 grandchildren   Moved here from [place] 2013   Social Determinants of Health   Financial Resource Strain:   . Difficulty of Paying Living Expenses:   Food Insecurity:   . Worried About Charity fundraiser in the Last Year:   . Arboriculturist in the Last Year:   Transportation Needs:   . Film/video editor (Medical):   Marland Kitchen Lack of Transportation (Non-Medical):   Physical Activity:   . Days of Exercise per Week:   . Minutes of Exercise per Session:   Stress:   . Feeling of Stress :   Social Connections:   . Frequency of Communication with Friends and Family:   . Frequency of Social Gatherings with Friends and Family:   . Attends Religious Services:   . Active Member of Clubs or Organizations:   . Attends Archivist Meetings:   Marland Kitchen Marital Status:   Intimate Partner Violence:   . Fear of Current or Ex-Partner:   . Emotionally Abused:   Marland Kitchen Physically Abused:   . Sexually Abused:     Family History  Problem Relation Age of Onset  . Heart attack Mother 55  . Heart disease Father   . Heart attack Father 27    Past Surgical History:  Procedure Laterality Date  . CERVICAL CONE BIOPSY    . TUBAL LIGATION      ROS: Review of Systems Negative except as stated above  PHYSICAL EXAM: BP 126/84   Pulse 87   Temp 98.2 F (36.8 C)   Resp 16   Wt 184 lb 9.6 oz (83.7 kg)   LMP 04/29/2012   SpO2 95%   BMI 29.35 kg/m   Wt Readings from Last 3 Encounters:  01/01/20 184 lb 9.6 oz (83.7 kg)  06/04/18 206 lb (93.4 kg)  04/18/18 200 lb (90.7 kg)    Physical Exam  General appearance - alert, well appearing, and in no distress Mental  status - normal mood, behavior, speech, dress, motor activity, and thought processes Neck - supple, no significant adenopathy.  No thyromegaly or nodules palpated. Lymphadenopathy: No cervical or axillary lymphadenopathy Chest - clear to auscultation, no wheezes, rales or rhonchi, symmetric air entry Heart - normal rate, regular rhythm, normal S1, S2, no murmurs, rubs, clicks or gallops  Abdomen - soft, nontender, nondistended, no masses or organomegaly Extremities - peripheral pulses normal, no pedal edema, no clubbing or cyanosis Skin -half centimeter erythematous area on the medial aspect of the right ankle posterior to the medial malleolus.  It has a scab on it.  It is not tender to touch.   CMP Latest Ref Rng & Units 04/13/2017 03/27/2017 03/08/2017  Glucose 65 - 99 mg/dL 77 110(H) 128(H)  BUN 6 - 24 mg/dL 11 10 16   Creatinine 0.57 - 1.00 mg/dL 1.04(H) 0.97 1.02(H)  Sodium 134 - 144 mmol/L 142 140 135  Potassium 3.5 - 5.2 mmol/L 4.3 3.4(L) 3.5  Chloride 96 - 106 mmol/L 104 104 104  CO2 20 - 29 mmol/L 23 27 21(L)  Calcium 8.7 - 10.2 mg/dL 9.2 9.0 9.1  Total Protein 6.5 - 8.1 g/dL - 7.2 7.6  Total Bilirubin 0.3 - 1.2 mg/dL - 0.3 0.8  Alkaline Phos 38 - 126 U/L - 72 78  AST 15 - 41 U/L - 17 21  ALT 14 - 54 U/L - 14 14   Lipid Panel     Component Value Date/Time   CHOL 206 (H) 02/18/2019 1420   TRIG 114 02/18/2019 1420   HDL 46 02/18/2019 1420   CHOLHDL 4.5 (H) 02/18/2019 1420   CHOLHDL 5.2 12/30/2016 0622   VLDL 34 12/30/2016 0622   LDLCALC 137 (H) 02/18/2019 1420    CBC    Component Value Date/Time   WBC 5.6 04/13/2017 1502   WBC 5.6 03/27/2017 0618   RBC 4.40 04/13/2017 1502   RBC 4.16 03/27/2017 0618   HGB 13.6 04/13/2017 1502   HCT 42.6 04/13/2017 1502   PLT 289 04/13/2017 1502   MCV 97 04/13/2017 1502   MCH 30.9 04/13/2017 1502   MCH 31.3 03/27/2017 0618   MCHC 31.9 04/13/2017 1502   MCHC 33.5 03/27/2017 0618   RDW 13.7 04/13/2017 1502   LYMPHSABS 2.1  03/08/2017 0719   MONOABS 1.0 03/08/2017 0719   EOSABS 0.1 03/08/2017 0719   BASOSABS 0.1 03/08/2017 0719    ASSESSMENT AND PLAN: 1. Vaginal discharge Patient will do self swab today for STD screening.  She is also agreeable to having HIV and hepatitis C screening.  Safe sex practices discussed and encouraged. - Cervicovaginal ancillary only  2. Chronic cough 3. Hemoptysis -History is concerning especially given the fact that she is more than a 30-pack-year smoker and having some weight loss.  Start with plain x-ray of the chest.  If unrevealing she will need CAT scan.  Strongly advised to quit smoking.  Patient not ready to give a trial of quitting. - DG Chest 2 View; Future   4. Tobacco dependence Patient advised to quit smoking. Discussed health risks associated with smoking including lung and other types of cancers, chronic lung diseases and CV risks.. Pt not ready to give trail of quitting.  Less than 5 minutes spent on counseling - Lipid panel  5. Bug bite, initial encounter This looks like it is healing.  Recommend using triple antibiotic ointment on it  6. Nausea and vomiting, intractability of vomiting not specified, unspecified vomiting type We will check some baseline blood tests and consider doing a gallbladder ultrasound if symptoms persist  7. Unexplained weight loss - CBC - Comprehensive metabolic panel - [id number] - Hemoglobin A1c  8. Screening for HIV (human immunodeficiency virus) - HIV Antibody (routine testing w rflx)  9. Need for hepatitis C screening test - Hepatitis C Antibody  10.  Screening for colon cancer Given her weight loss and age, I told her she needs to get up-to-date with age-appropriate cancer screening including colonoscopy.  She will apply for the orange card/cone discount card and will follow up with me in 6 weeks.  If she has been approved by that time we will refer her to the gastroenterologist  11. Encounter for screening  mammogram for malignant neoplasm of breast - MM Digital Screening; Future  Patient was given the opportunity to ask questions.  Patient verbalized understanding of the plan and was able to repeat key elements of the plan.   No orders of the defined types were placed in this encounter.    Requested Prescriptions    No prescriptions requested or ordered in this encounter    No follow-ups on file.  Karle Plumber, MD, FACP

## 2020-01-02 ENCOUNTER — Ambulatory Visit (HOSPITAL_COMMUNITY)
Admission: RE | Admit: 2020-01-02 | Discharge: 2020-01-02 | Disposition: A | Discharge status: Home / Self Care | Payer: Self-pay | Source: Ambulatory Visit | Attending: Internal Medicine | Admitting: Internal Medicine

## 2020-01-02 DIAGNOSIS — R053 Chronic cough: Secondary | ICD-10-CM

## 2020-01-02 DIAGNOSIS — R05 Cough: Secondary | ICD-10-CM | POA: Insufficient documentation

## 2020-01-02 LAB — HEMOGLOBIN A1C
Est. average glucose Bld gHb Est-mCnc: 105 mg/dL
Hgb A1c MFr Bld: 5.3 % (ref 4.8–5.6)

## 2020-01-02 LAB — CERVICOVAGINAL ANCILLARY ONLY
Bacterial Vaginitis (gardnerella): NEGATIVE
Candida Glabrata: NEGATIVE
Candida Vaginitis: NEGATIVE
Chlamydia: NEGATIVE
Comment: NEGATIVE
Comment: NEGATIVE
Comment: NEGATIVE
Comment: NEGATIVE
Comment: NEGATIVE
Comment: NORMAL
Neisseria Gonorrhea: NEGATIVE
Trichomonas: NEGATIVE

## 2020-01-02 LAB — CBC
Hematocrit: 42.1 % (ref 34.0–46.6)
Hemoglobin: 14.4 g/dL (ref 11.1–15.9)
MCH: 31.9 pg (ref 26.6–33.0)
MCHC: 34.2 g/dL (ref 31.5–35.7)
MCV: 93 fL (ref 79–97)
Platelets: 355 10*3/uL (ref 150–450)
RBC: 4.51 x10E6/uL (ref 3.77–5.28)
RDW: 12.9 % (ref 11.7–15.4)
WBC: 8.8 10*3/uL (ref 3.4–10.8)

## 2020-01-02 LAB — COMPREHENSIVE METABOLIC PANEL
ALT: 18 IU/L (ref 0–32)
AST: 29 IU/L (ref 0–40)
Albumin/Globulin Ratio: 2 (ref 1.2–2.2)
Albumin: 4.8 g/dL (ref 3.8–4.9)
Alkaline Phosphatase: 117 IU/L (ref 48–121)
BUN/Creatinine Ratio: 10 (ref 9–23)
BUN: 10 mg/dL (ref 6–24)
Bilirubin Total: 0.4 mg/dL (ref 0.0–1.2)
CO2: 24 mmol/L (ref 20–29)
Calcium: 9.3 mg/dL (ref 8.7–10.2)
Chloride: 102 mmol/L (ref 96–106)
Creatinine, Ser: 1.02 mg/dL — ABNORMAL HIGH (ref 0.57–1.00)
GFR calc Af Amer: 73 mL/min/{1.73_m2} (ref >59)
GFR calc non Af Amer: 63 mL/min/{1.73_m2} (ref >59)
Globulin, Total: 2.4 g/dL (ref 1.5–4.5)
Glucose: 91 mg/dL (ref 65–99)
Potassium: 4.1 mmol/L (ref 3.5–5.2)
Sodium: 140 mmol/L (ref 134–144)
Total Protein: 7.2 g/dL (ref 6.0–8.5)

## 2020-01-02 LAB — TSH+T4F+T3FREE
Free T4: 1.29 ng/dL (ref 0.82–1.77)
T3, Free: 3.2 pg/mL (ref 2.0–4.4)
TSH: 1.26 u[IU]/mL (ref 0.450–4.500)

## 2020-01-02 LAB — LIPID PANEL
Chol/HDL Ratio: 5 ratio — ABNORMAL HIGH (ref 0.0–4.4)
Cholesterol, Total: 266 mg/dL — ABNORMAL HIGH (ref 100–199)
HDL: 53 mg/dL (ref >39)
LDL Chol Calc (NIH): 189 mg/dL — ABNORMAL HIGH (ref 0–99)
Triglycerides: 133 mg/dL (ref 0–149)
VLDL Cholesterol Cal: 24 mg/dL (ref 5–40)

## 2020-01-02 LAB — HEPATITIS C ANTIBODY: Hep C Virus Ab: <0.1 s/co ratio (ref 0.0–0.9)

## 2020-01-02 LAB — HIV ANTIBODY (ROUTINE TESTING W REFLEX): HIV Screen 4th Generation wRfx: NONREACTIVE

## 2020-01-03 NOTE — Progress Notes (Signed)
Let patient know:   Screening test for HIV and hepatitis C were negative.Screens for chlamydia, gonorrhea, trichomonas were negative. Thyroid level is normal. Count is normal meaning no anemia. Kidney and liver function tests are okay. Total and LDL cholesterol are elevated.  High cholesterol increases the risk for heart attack and strokes.  Healthy eating habits and regular exercise will help to lower cholesterol.   The 10-year ASCVD risk score Mikey Bussing DC Brooke Bonito., et al., 2013) is: 5.7%   Values used to calculate the score:     Age: 54 years     Sex: Female     Is Non-Hispanic African American: No     Diabetic: No     Tobacco smoker: Yes     Systolic Blood Pressure: 462 mmHg     Is BP treated: No     HDL Cholesterol: 53 mg/dL     Total Cholesterol: 266 mg/dL

## 2020-01-04 ENCOUNTER — Other Ambulatory Visit: Payer: Self-pay | Admitting: Internal Medicine

## 2020-01-04 DIAGNOSIS — F17200 Nicotine dependence, unspecified, uncomplicated: Secondary | ICD-10-CM

## 2020-01-04 DIAGNOSIS — R053 Chronic cough: Secondary | ICD-10-CM

## 2020-01-04 DIAGNOSIS — R042 Hemoptysis: Secondary | ICD-10-CM

## 2020-01-05 ENCOUNTER — Encounter: Payer: Self-pay | Admitting: Internal Medicine

## 2020-01-05 DIAGNOSIS — M62830 Muscle spasm of back: Secondary | ICD-10-CM

## 2020-01-05 MED ORDER — CYCLOBENZAPRINE HCL 10 MG PO TABS: 10.0000 mg | ORAL_TABLET | Freq: Every day | ORAL | 2 refills | Status: DC | PRN

## 2020-01-06 MED FILL — CYCLOBENZAPRINE 10 MG TAB: 10 | 30 days supply | Qty: 30 | Fill #0

## 2020-01-07 ENCOUNTER — Encounter: Payer: Self-pay | Admitting: Internal Medicine

## 2020-01-07 MED FILL — QUETIAPINE FUMARATE 300 MG: 300 | 30 days supply | Qty: 30 | Fill #1

## 2020-01-14 ENCOUNTER — Telehealth: Payer: Self-pay

## 2020-01-14 DIAGNOSIS — R634 Abnormal weight loss: Secondary | ICD-10-CM

## 2020-01-14 DIAGNOSIS — R112 Nausea with vomiting, unspecified: Secondary | ICD-10-CM

## 2020-01-14 NOTE — Telephone Encounter (Signed)
Contacted pt to see if she had any questions or concerns regarding her lab results. Pt states the only issue is that she is thinking that something is wrong with her stomach

## 2020-01-19 ENCOUNTER — Ambulatory Visit (HOSPITAL_COMMUNITY)
Admission: RE | Admit: 2020-01-19 | Discharge: 2020-01-19 | Disposition: A | Discharge status: Home / Self Care | Payer: Self-pay | Source: Ambulatory Visit | Attending: Internal Medicine | Admitting: Internal Medicine

## 2020-01-19 ENCOUNTER — Other Ambulatory Visit: Payer: Self-pay

## 2020-01-19 DIAGNOSIS — F17200 Nicotine dependence, unspecified, uncomplicated: Secondary | ICD-10-CM

## 2020-01-19 DIAGNOSIS — R053 Chronic cough: Secondary | ICD-10-CM

## 2020-01-19 DIAGNOSIS — R05 Cough: Secondary | ICD-10-CM | POA: Insufficient documentation

## 2020-01-19 DIAGNOSIS — R042 Hemoptysis: Secondary | ICD-10-CM

## 2020-01-19 MED ORDER — IOHEXOL 300 MG/ML  SOLN: 75.0000 mL | Freq: Once | INTRAMUSCULAR | Status: DC | PRN

## 2020-01-19 MED ORDER — IOHEXOL 300 MG/ML  SOLN
75.0000 mL | Freq: Once | INTRAMUSCULAR | Status: AC | PRN
  Administered 2020-01-19: 75 mL via INTRAVENOUS

## 2020-01-28 ENCOUNTER — Other Ambulatory Visit: Payer: Self-pay | Admitting: Physician Assistant

## 2020-01-28 DIAGNOSIS — K219 Gastro-esophageal reflux disease without esophagitis: Secondary | ICD-10-CM

## 2020-01-28 MED FILL — OMEPRAZOLE 20 MG CAP: 20 | 30 days supply | Qty: 30 | Fill #0

## 2020-01-28 MED FILL — lamoTRIgine 100 MG TABS: 100 | 30 days supply | Qty: 90 | Fill #1

## 2020-01-28 NOTE — Telephone Encounter (Signed)
Requested Prescriptions  Pending Prescriptions Disp Refills  . omeprazole (PRILOSEC) 20 MG capsule [Pharmacy Med Name: OMEPRAZOLE 20 MG CAP DR 20 Capsule] 90 capsule 0    Sig: TAKE 1 CAPSULE (20 MG TOTAL) BY MOUTH DAILY.     Gastroenterology: Proton Pump Inhibitors Passed - 01/28/2020  2:36 PM      Passed - Valid encounter within last 12 months    Recent Outpatient Visits          3 weeks ago Vaginal discharge   [place], [place]   3 months ago Spasm of muscle of lower back   [place], [place]   9 months ago Dental abscess   [place], [place]   11 months ago Screening for colon cancer   [place], Deborah B, MD   1 year ago Dental abscess   [place], [place]      Future Appointments            In 2 weeks Ladell [place], MD [place]

## 2020-01-29 ENCOUNTER — Ambulatory Visit: Payer: Self-pay

## 2020-01-29 MED FILL — QUETIAPINE FUMARATE 300 MG: 300 | 30 days supply | Qty: 30 | Fill #1

## 2020-01-30 ENCOUNTER — Telehealth: Payer: Self-pay | Admitting: Internal Medicine

## 2020-01-30 ENCOUNTER — Other Ambulatory Visit: Payer: Self-pay | Admitting: Internal Medicine

## 2020-01-30 DIAGNOSIS — K047 Periapical abscess without sinus: Secondary | ICD-10-CM

## 2020-01-30 MED ORDER — IBUPROFEN 600 MG PO TABS: 600.0000 mg | ORAL_TABLET | Freq: Three times a day (TID) | ORAL | 0 refills | Status: DC | PRN

## 2020-01-30 MED FILL — IBUPROFEN 600 MG TABLET: 600 | 10 days supply | Qty: 30 | Fill #0

## 2020-01-30 NOTE — Telephone Encounter (Signed)
Please advise.   Copied from [place] [id number]. Topic: General - Other >> Jan 29, 2020  2:15 PM Keene Breath wrote: Reason for CRM: Patient called to speak with the Financial Counselor regarding her orange card.  Please call patient at [phone]

## 2020-01-30 NOTE — Telephone Encounter (Signed)
Please fill if appropriate.  

## 2020-01-30 NOTE — Telephone Encounter (Signed)
I return Pt call, she just want to schedule a financial appt

## 2020-02-06 ENCOUNTER — Ambulatory Visit: Payer: Self-pay

## 2020-02-09 MED FILL — CYCLOBENZAPRINE 10 MG TAB: 10 | 30 days supply | Qty: 30 | Fill #1

## 2020-02-11 ENCOUNTER — Ambulatory Visit: Payer: Self-pay | Attending: Internal Medicine

## 2020-02-11 ENCOUNTER — Other Ambulatory Visit: Payer: Self-pay

## 2020-02-11 ENCOUNTER — Other Ambulatory Visit (HOSPITAL_COMMUNITY): Payer: Self-pay | Admitting: Psychiatry

## 2020-02-11 DIAGNOSIS — F4310 Post-traumatic stress disorder, unspecified: Secondary | ICD-10-CM

## 2020-02-12 ENCOUNTER — Telehealth: Payer: Self-pay | Admitting: Internal Medicine

## 2020-02-12 NOTE — Telephone Encounter (Signed)
Copied from [place] [id number]. Topic: General - Inquiry >> Feb 12, 2020  1:31 PM Alease Frame wrote: Reason for CRM: Pt is requesting Financial Counselor Clifton James to give her a call .please advise

## 2020-02-16 ENCOUNTER — Ambulatory Visit: Payer: Self-pay | Attending: Internal Medicine | Admitting: Internal Medicine

## 2020-02-16 ENCOUNTER — Encounter: Payer: Self-pay | Admitting: Internal Medicine

## 2020-02-16 DIAGNOSIS — R112 Nausea with vomiting, unspecified: Secondary | ICD-10-CM

## 2020-02-16 DIAGNOSIS — R918 Other nonspecific abnormal finding of lung field: Secondary | ICD-10-CM | POA: Insufficient documentation

## 2020-02-16 DIAGNOSIS — R634 Abnormal weight loss: Secondary | ICD-10-CM

## 2020-02-16 DIAGNOSIS — F17200 Nicotine dependence, unspecified, uncomplicated: Secondary | ICD-10-CM

## 2020-02-16 MED ORDER — ONDANSETRON HCL 4 MG PO TABS: 4.0000 mg | ORAL_TABLET | Freq: Every day | ORAL | 0 refills | Status: DC | PRN

## 2020-02-16 NOTE — Progress Notes (Signed)
Virtual Visit via Telephone Note Due to current restrictions/limitations of in-office visits due to the COVID-19 pandemic, this scheduled clinical appointment was converted to a telehealth visit  I connected with Katie Sherman on 02/16/20 at 4:07 p.m by telephone and verified that I am speaking with the correct person using two identifiers. I am in my office.  The patient is at home.  Only the patient and myself participated in this encounter.  I discussed the limitations, risks, security and privacy concerns of performing an evaluation and management service by telephone and the availability of in person appointments. I also discussed with the patient that there may be a patient responsible charge related to this service. The patient expressed understanding and agreed to proceed.   History of Present Illness: Pt with hx of GERD, HL, bipolar 1, chronic LT shoulder and Rt hip pain.  Tob dep/chronic cough/hemoptysis:  No further episode of hemoptysis.  Cough resolved when she stopped smoking the Delta 8 pen that she mentioned on last visit.  She did get the results of the X-ray and CT scan of chest.  This revealed some small pulmonary nodules for which she will need repeat study in 6 to 12 months.  She now wants to quit smoking and plans to do so cold [place] but has not set a day.  She does not want any meds to help her quit.  Reports using patches, gum and oral meds before.  Nausea/vomiting:  Still having nausea and vomiting but no further wgh loss.  "I can have couple good days then couple bad days would pop up."  Only once she felt it was related to food.  CT chest revealed incidental finding of small sliding-type hiatal hernia.   Outpatient Encounter Medications as of 02/16/2020  Medication Sig  . cyclobenzaprine (FLEXERIL) 10 MG tablet Take 1 tablet (10 mg total) by mouth daily as needed for muscle spasms.  . diclofenac sodium (VOLTAREN) 1 % GEL Apply 2 g topically 4 (four) times daily.  Marland Kitchen  ibuprofen (ADVIL) 600 MG tablet Take 1 tablet (600 mg total) by mouth every 8 (eight) hours as needed.  . lamoTRIgine (LAMICTAL) 100 MG tablet Take 3 tablets (300 mg total) by mouth daily.  . nitroGLYCERIN (NITRODUR - DOSED IN MG/24 HR) 0.2 mg/hr patch Use 1/4 patch daily to the affected area  . omeprazole (PRILOSEC) 20 MG capsule TAKE 1 CAPSULE (20 MG TOTAL) BY MOUTH DAILY.  Marland Kitchen penicillin v potassium (VEETID) 500 MG tablet Take 1 tablet (500 mg total) by mouth 4 (four) times daily.  . QUEtiapine (SEROQUEL) 300 MG tablet Take 1 tablet (300 mg total) by mouth at bedtime.  . traZODone (DESYREL) 50 MG tablet Take 1-2 tab at bed time   No facility-administered encounter medications on file as of 02/16/2020.      Observations/Objective:  Results for orders placed or performed in visit on 01/01/20  HIV Antibody (routine testing w rflx)  Result Value Ref Range   HIV Screen 4th Generation wRfx Non Reactive Non Reactive  Hepatitis C Antibody  Result Value Ref Range   Hep C Virus Ab <0.1 0.0 - 0.9 s/co ratio  CBC  Result Value Ref Range   WBC 8.8 3.4 - 10.8 x10E3/uL   RBC 4.51 3.77 - 5.28 x10E6/uL   Hemoglobin 14.4 11.1 - 15.9 g/dL   Hematocrit 42.1 34.0 - 46.6 %   MCV 93 79 - 97 fL   MCH 31.9 26.6 - 33.0 pg   MCHC 34.2  31 - 35 g/dL   RDW 12.9 11.7 - 15.4 %   Platelets 355 150 - 450 x10E3/uL  Comprehensive metabolic panel  Result Value Ref Range   Glucose 91 65 - 99 mg/dL   BUN 10 6 - 24 mg/dL   Creatinine, Ser 1.02 (H) 0.57 - 1.00 mg/dL   GFR calc non Af Amer 63 >59 mL/min/1.73   GFR calc Af Amer 73 >59 mL/min/1.73   BUN/Creatinine Ratio 10 9 - 23   Sodium 140 134 - 144 mmol/L   Potassium 4.1 3.5 - 5.2 mmol/L   Chloride 102 96 - 106 mmol/L   CO2 24 20 - 29 mmol/L   Calcium 9.3 8.7 - 10.2 mg/dL   Total Protein 7.2 6.0 - 8.5 g/dL   Albumin 4.8 3.8 - 4.9 g/dL   Globulin, Total 2.4 1.5 - 4.5 g/dL   Albumin/Globulin Ratio 2.0 1.2 - 2.2   Bilirubin Total 0.4 0.0 - 1.2 mg/dL   Alkaline  Phosphatase 117 48 - 121 IU/L   AST 29 0 - 40 IU/L   ALT 18 0 - 32 IU/L  Lipid panel  Result Value Ref Range   Cholesterol, Total 266 (H) 100 - 199 mg/dL   Triglycerides 133 0 - 149 mg/dL   HDL 53 >39 mg/dL   VLDL Cholesterol Cal 24 5 - 40 mg/dL   LDL Chol Calc (NIH) 189 (H) 0 - 99 mg/dL   Chol/HDL Ratio 5.0 (H) 0.0 - 4.4 ratio  TSH+T4F+T3Free  Result Value Ref Range   TSH 1.260 0.450 - 4.500 uIU/mL   T3, Free 3.2 2.0 - 4.4 pg/mL   Free T4 1.29 0.82 - 1.77 ng/dL  Hemoglobin A1c  Result Value Ref Range   Hgb A1c MFr Bld 5.3 4.8 - 5.6 %   Est. average glucose Bld gHb Est-mCnc 105 mg/dL  Cervicovaginal ancillary only  Result Value Ref Range   Neisseria Gonorrhea Negative    Chlamydia Negative    Trichomonas Negative    Bacterial Vaginitis (gardnerella) Negative    Candida Vaginitis Negative    Candida Glabrata Negative    Comment      Normal Reference Range Bacterial Vaginosis - Negative   Comment Normal Reference Range Candida Species - Negative    Comment Normal Reference Range Candida Galbrata - Negative    Comment Normal Reference Range Trichomonas - Negative    Comment Normal Reference Ranger Chlamydia - Negative    Comment      Normal Reference Range Neisseria Gonorrhea - Negative    Assessment and Plan: 1. Lung nodules Strongly advised to quit smoking. Informed her that we will need to repeat the CAT scan in 6 to 12 months to assess for any changes in size of these nodules.  2. Tobacco dependence Advised to quit smoking.  She now wants to give a trial of quitting but feels that the only way she would be able to do it is to quit cold [place] which she has done in the past.  I have encouraged her to set a quit date.  Less than 5 minutes spent on counseling.  3. Nausea and vomiting, intractability of vomiting not specified, unspecified vomiting type We will plan to refer to GI when she has been approved for the orange card/cone discount which she is working on  getting. - US Abdomen Limited RUQ; Future - ondansetron (ZOFRAN) 4 MG tablet; Take 1 tablet (4 mg total) by mouth daily as needed for nausea or vomiting.  Dispense:  20 tablet; Refill: 0 - Ambulatory referral to Gastroenterology  4. Unexplained weight loss - Ambulatory referral to Gastroenterology   Follow Up Instructions: 2 mths  I discussed the assessment and treatment plan with the patient. The patient was provided an opportunity to ask questions and all were answered. The patient agreed with the plan and demonstrated an understanding of the instructions.   The patient was advised to call back or seek an in-person evaluation if the symptoms worsen or if the condition fails to improve as anticipated.  I provided 75minutes of non-face-to-face time during this encounter.   Karle Plumber, MD

## 2020-02-17 MED FILL — ONDANSETRON HCL 4 MG TABLET: 4 | 20 days supply | Qty: 20 | Fill #0

## 2020-02-20 ENCOUNTER — Telehealth: Payer: Self-pay | Admitting: Internal Medicine

## 2020-02-20 NOTE — Telephone Encounter (Signed)
Patient stopped in the office and submitted 6 papers for [facility]. Please follow up at your earliest convenience.

## 2020-02-20 NOTE — Telephone Encounter (Signed)
Patient requested for a call to confirm you received the papers.

## 2020-03-02 MED FILL — OMEPRAZOLE 20 MG CAP: 20 | 30 days supply | Qty: 30 | Fill #1

## 2020-03-02 MED FILL — QUETIAPINE FUMARATE 300 MG: 300 | 30 days supply | Qty: 30 | Fill #2

## 2020-03-02 MED FILL — lamoTRIgine 100 MG TABS: 100 | 30 days supply | Qty: 90 | Fill #2

## 2020-03-03 ENCOUNTER — Telehealth: Payer: Self-pay | Admitting: Internal Medicine

## 2020-03-03 NOTE — Telephone Encounter (Signed)
Pt was sent a letter from financial dept. Inform them, that the application they submitted was incomplete, since they were missing some documentation at the time of the appointment, Pt need to reschedule and resubmit all new papers and application for [employer] and [facility], P.S. old documents has been sent back by mail to the Pt and Pt. need to make a new appt. 

## 2020-03-08 ENCOUNTER — Telehealth (INDEPENDENT_AMBULATORY_CARE_PROVIDER_SITE_OTHER): Payer: Self-pay | Admitting: Psychiatry

## 2020-03-08 ENCOUNTER — Other Ambulatory Visit: Payer: Self-pay

## 2020-03-08 ENCOUNTER — Other Ambulatory Visit (HOSPITAL_COMMUNITY): Payer: Self-pay | Admitting: Psychiatry

## 2020-03-08 ENCOUNTER — Encounter (HOSPITAL_COMMUNITY): Payer: Self-pay | Admitting: Psychiatry

## 2020-03-08 VITALS — Wt 190.0 lb

## 2020-03-08 DIAGNOSIS — F1210 Cannabis abuse, uncomplicated: Secondary | ICD-10-CM

## 2020-03-08 DIAGNOSIS — F4310 Post-traumatic stress disorder, unspecified: Secondary | ICD-10-CM

## 2020-03-08 DIAGNOSIS — F319 Bipolar disorder, unspecified: Secondary | ICD-10-CM

## 2020-03-08 MED ORDER — QUETIAPINE FUMARATE 300 MG PO TABS: 300.0000 mg | ORAL_TABLET | Freq: Every day | ORAL | 2 refills | Status: DC

## 2020-03-08 MED ORDER — TRAZODONE HCL 50 MG PO TABS: 50.0000 mg | ORAL_TABLET | Freq: Every evening | ORAL | 1 refills | Status: DC | PRN

## 2020-03-08 MED ORDER — LAMOTRIGINE 100 MG PO TABS: 300.0000 mg | ORAL_TABLET | Freq: Every day | ORAL | 2 refills | Status: DC

## 2020-03-08 MED FILL — CYCLOBENZAPRINE 10 MG TAB: 10 | 30 days supply | Qty: 30 | Fill #2
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
## 2020-03-09 MED FILL — traZODone HCL 50 MG TABS: 50 | 30 days supply | Qty: 30 | Fill #0

## 2020-03-17 ENCOUNTER — Telehealth: Payer: Self-pay | Admitting: Internal Medicine

## 2020-03-17 NOTE — Telephone Encounter (Signed)
Will forward to pcp

## 2020-03-17 NOTE — Telephone Encounter (Signed)
Please advise.  Copied from [place] [id number]. Topic: General - Other >> Mar 17, 2020 11:32 AM Celene Kras wrote: Reason for CRM: Pt called stating that she been sick and vomiting. She states that she believes it may be due to the hernia. Pt states that her vomit is brown. Please advise.

## 2020-03-18 NOTE — Telephone Encounter (Signed)
Pt states she doesn't have th oc so she is unable to go to the GI doctor. Pt states she almost went to the ER. Pt states she burped and it smelled like rotten eggs. Pt states under her rib cage it hurts. Pt states this has been going on since Tuesday night till yesterday. Pt states this has happened   Per Dr. Wynetta Emery pt does need to be seen by GI. Per Dr. Wynetta Emery she is willing to see pt. Pt states she will just wait. Made pt aware that if her symptoms get any worse to go to the ER

## 2020-03-23 NOTE — Telephone Encounter (Signed)
Please contact pt and schedule an appt for in person with any available provider

## 2020-03-23 NOTE — Telephone Encounter (Signed)
Scheduled appt on 04/05/20 @ 2:00 pm with Dr. Joya Gaskins at [place]. Called pt LVM asking pt to call back to confirm appt.

## 2020-04-01 MED FILL — OMEPRAZOLE 20 MG CAP: 20 | 30 days supply | Qty: 30 | Fill #2

## 2020-04-01 MED FILL — lamoTRIgine 100 MG TABS: 100 | 30 days supply | Qty: 90 | Fill #0

## 2020-04-05 ENCOUNTER — Ambulatory Visit: Payer: Self-pay | Admitting: Internal Medicine

## 2020-04-07 ENCOUNTER — Encounter: Payer: Self-pay | Admitting: Internal Medicine

## 2020-04-07 DIAGNOSIS — M62830 Muscle spasm of back: Secondary | ICD-10-CM

## 2020-04-08 ENCOUNTER — Other Ambulatory Visit: Payer: Self-pay

## 2020-04-08 ENCOUNTER — Ambulatory Visit: Payer: Self-pay | Attending: Family | Admitting: Family

## 2020-04-08 DIAGNOSIS — N898 Other specified noninflammatory disorders of vagina: Secondary | ICD-10-CM

## 2020-04-08 MED ORDER — METRONIDAZOLE 500 MG PO TABS: 500.0000 mg | ORAL_TABLET | Freq: Two times a day (BID) | ORAL | 0 refills | Status: AC

## 2020-04-08 MED FILL — ?METRONIDAZOLE 500 MG TABS: 500 | 7 days supply | Qty: 14 | Fill #0

## 2020-04-08 NOTE — Patient Instructions (Signed)
STI testing today. Bacterial Vaginosis  Bacterial vaginosis is an infection of the vagina. It happens when too many normal germs (healthy bacteria) grow in the vagina. This infection puts you at risk for infections from sex (STIs). Treating this infection can lower your risk for some STIs. You should also treat this if you are pregnant. It can cause your baby to be born early. Follow these instructions at home: Medicines  Take over-the-counter and prescription medicines only as told by your doctor.  Take or use your antibiotic medicine as told by your doctor. Do not stop taking or using it even if you start to feel better. General instructions  If you your sexual partner is a woman, tell her that you have this infection. She needs to get treatment if she has symptoms. If you have a female partner, he does not need to be treated.  During treatment: ? Avoid sex. ? Do not douche. ? Avoid alcohol as told. ? Avoid breastfeeding as told.  Drink enough fluid to keep your pee (urine) clear or pale yellow.  Keep your vagina and butt (rectum) clean. ? Wash the area with warm water every day. ? Wipe from front to back after you use the toilet.  Keep all follow-up visits as told by your doctor. This is important. Preventing this condition  Do not douche.  Use only warm water to wash around your vagina.  Use protection when you have sex. This includes: ? Latex condoms. ? Dental dams.  Limit how many people you have sex with. It is best to only have sex with the same person (be monogamous).  Get tested for STIs. Have your partner get tested.  Wear underwear that is cotton or lined with cotton.  Avoid tight pants and pantyhose. This is most important in summer.  Do not use any products that have nicotine or tobacco in them. These include cigarettes and e-cigarettes. If you need help quitting, ask your doctor.  Do not use illegal drugs.  Limit how much alcohol you drink. Contact a  doctor if:  Your symptoms do not get better, even after you are treated.  You have more discharge or pain when you pee (urinate).  You have a fever.  You have pain in your belly (abdomen).  You have pain with sex.  Your bleed from your vagina between periods. Summary  This infection happens when too many germs (bacteria) grow in the vagina.  Treating this condition can lower your risk for some infections from sex (STIs).  You should also treat this if you are pregnant. It can cause early (premature) birth.  Do not stop taking or using your antibiotic medicine even if you start to feel better. This information is not intended to replace advice given to you by your health care provider. Make sure you discuss any questions you have with your health care provider. Document Revised: 06/08/2017 Document Reviewed: 03/11/2016 Elsevier Patient Education  2020 [employer].

## 2020-04-08 NOTE — Progress Notes (Signed)
Virtual Visit via Telephone Note  I connected with Katie Sherman, on 04/08/2020 at 1:44 PM by telephone due to the COVID-19 pandemic and verified that I am speaking with the correct person using two identifiers.  Due to current restrictions/limitations of in-office visits due to the COVID-19 pandemic, this scheduled clinical appointment was converted to a telehealth visit.   Consent: I discussed the limitations, risks, security and privacy concerns of performing an evaluation and management service by telephone and the availability of in person appointments. I also discussed with the patient that there may be a patient responsible charge related to this service. The patient expressed understanding and agreed to proceed.   Location of Patient: Home  Location of Provider: [facility] [place]  Persons participating in Telemedicine visit: Pennelope Bracken, NP Orlan Leavens, CMA  History of Present Illness: Katie Sherman is a 54 year old female who presents for vaginal discharge.   1. VAGINAL DISCHARGE: Onset: 1 week ago  Description: yellow  Symptoms Odor: yes  Itching: denies Vaginal burning: denies  Dysuria: denies Bleeding: denies Pelvic pain: denies Back pain: denies Fever: denies Genital sores: denies Rash: denies Dyspareunia: denies GI Symptoms: denies   Past Medical History:  Diagnosis Date  . Bilateral carpal tunnel syndrome 03/19/2018  . Cervical cancer ([place])   . Cervical cancer ([place])   . History of exercise stress test    ETT-Echo 5/18: normal EF, no ischemia  . History of loop recorder    currently on person  . Kidney stone    No Known Allergies  Current Outpatient Medications on File Prior to Visit  Medication Sig Dispense Refill  . cyclobenzaprine (FLEXERIL) 10 MG tablet Take 1 tablet (10 mg total) by mouth daily as needed for muscle spasms. 30 tablet 2  . diclofenac sodium (VOLTAREN) 1 % GEL Apply 2 g topically 4  (four) times daily. 100 g 2  . ibuprofen (ADVIL) 600 MG tablet Take 1 tablet (600 mg total) by mouth every 8 (eight) hours as needed. 30 tablet 0  . lamoTRIgine (LAMICTAL) 100 MG tablet Take 3 tablets (300 mg total) by mouth daily. 90 tablet 2  . nitroGLYCERIN (NITRODUR - DOSED IN MG/24 HR) 0.2 mg/hr patch Use 1/4 patch daily to the affected area 30 patch 1  . omeprazole (PRILOSEC) 20 MG capsule TAKE 1 CAPSULE (20 MG TOTAL) BY MOUTH DAILY. 90 capsule 0  . ondansetron (ZOFRAN) 4 MG tablet Take 1 tablet (4 mg total) by mouth daily as needed for nausea or vomiting. 20 tablet 0  . penicillin v potassium (VEETID) 500 MG tablet Take 1 tablet (500 mg total) by mouth 4 (four) times daily. 28 tablet 0  . QUEtiapine (SEROQUEL) 300 MG tablet Take 1 tablet (300 mg total) by mouth at bedtime. 30 tablet 2  . traZODone (DESYREL) 50 MG tablet Take 1 tablet (50 mg total) by mouth at bedtime as needed for sleep. 30 tablet 1   No current facility-administered medications on file prior to visit.    Observations/Objective: Alert and oriented x 3. Not in acute distress. Physical examination not completed as this is a telemedicine visit.  Assessment and Plan: 1. Vaginal discharge: - Cervicovaginal ancillary to screen for sexually transmitted infections.  - Patient with history of bacterial vaginosis. Empiric treatment with Metronidazole for possible bacterial vaginosis as results may not return until over the weekend. Counseled patient to hold the medication until her results return via [facility]. Patient verbalized understanding. -  Follow-up with primary physician as needed. - Cervicovaginal ancillary only - metroNIDAZOLE (FLAGYL) 500 MG tablet; Take 1 tablet (500 mg total) by mouth 2 (two) times daily for 7 days.  Dispense: 14 tablet; Refill: 0  Follow Up Instructions: Follow-up with primary physician as needed.   Patient was given clear instructions to go to Emergency Department or return to medical center if  symptoms don't improve, worsen, or new problems develop.The patient verbalized understanding.  I discussed the assessment and treatment plan with the patient. The patient was provided an opportunity to ask questions and all were answered. The patient agreed with the plan and demonstrated an understanding of the instructions.   The patient was advised to call back or seek an in-person evaluation if the symptoms worsen or if the condition fails to improve as anticipated.   I provided 10 minutes total of non-face-to-face time during this encounter including median intraservice time, reviewing previous notes, labs, imaging, medications, management and patient verbalized understanding.    Camillia Herter, NP  [facility] and [facility] [place], [place]   04/08/2020, 1:44 PM

## 2020-04-09 ENCOUNTER — Encounter: Payer: Self-pay | Admitting: Internal Medicine

## 2020-04-09 LAB — CERVICOVAGINAL ANCILLARY ONLY
Bacterial Vaginitis (gardnerella): NEGATIVE
Candida Glabrata: NEGATIVE
Candida Vaginitis: NEGATIVE
Chlamydia: NEGATIVE
Comment: NEGATIVE
Comment: NEGATIVE
Comment: NEGATIVE
Comment: NEGATIVE
Comment: NEGATIVE
Comment: NORMAL
Neisseria Gonorrhea: NEGATIVE
Trichomonas: NEGATIVE

## 2020-04-09 NOTE — Progress Notes (Signed)
Please call patient with update.   STIs all negative.   Follow-up as needed with primary physician.

## 2020-04-15 ENCOUNTER — Other Ambulatory Visit: Payer: Self-pay | Admitting: Internal Medicine

## 2020-04-15 ENCOUNTER — Ambulatory Visit: Payer: Self-pay | Admitting: Internal Medicine

## 2020-04-15 DIAGNOSIS — M62830 Muscle spasm of back: Secondary | ICD-10-CM

## 2020-04-15 MED ORDER — CYCLOBENZAPRINE HCL 10 MG PO TABS: 10.0000 mg | ORAL_TABLET | Freq: Every day | ORAL | 2 refills | Status: DC | PRN

## 2020-04-15 MED FILL — CYCLOBENZAPRINE 10 MG TAB: 10 | 30 days supply | Qty: 30 | Fill #0

## 2020-04-15 MED FILL — ?TRAZODONE HCL 50 TABS: 50 | 30 days supply | Qty: 30 | Fill #1

## 2020-04-15 MED FILL — QUETIAPINE FUMARATE 300 MG: 300 | 30 days supply | Qty: 30 | Fill #0

## 2020-04-20 ENCOUNTER — Ambulatory Visit: Payer: Self-pay | Admitting: Internal Medicine

## 2020-05-05 ENCOUNTER — Other Ambulatory Visit: Payer: Self-pay | Admitting: Internal Medicine

## 2020-05-05 ENCOUNTER — Other Ambulatory Visit: Payer: Self-pay | Admitting: Physician Assistant

## 2020-05-05 DIAGNOSIS — K047 Periapical abscess without sinus: Secondary | ICD-10-CM

## 2020-05-05 DIAGNOSIS — K219 Gastro-esophageal reflux disease without esophagitis: Secondary | ICD-10-CM

## 2020-05-05 DIAGNOSIS — R112 Nausea with vomiting, unspecified: Secondary | ICD-10-CM

## 2020-05-05 MED ORDER — IBUPROFEN 600 MG PO TABS: 600.0000 mg | ORAL_TABLET | Freq: Three times a day (TID) | ORAL | 0 refills | Status: DC | PRN

## 2020-05-05 MED ORDER — ONDANSETRON HCL 4 MG PO TABS: 4.0000 mg | ORAL_TABLET | Freq: Every day | ORAL | 0 refills | Status: DC | PRN

## 2020-05-05 MED ORDER — OMEPRAZOLE 20 MG PO CPDR
20.0000 mg | DELAYED_RELEASE_CAPSULE | Freq: Every day | ORAL | 3 refills | Status: DC
  Filled 2020-11-08: qty 90, 90d supply, fill #0
  Filled 2021-03-04: qty 90, 90d supply, fill #1

## 2020-05-05 MED FILL — ONDANSETRON HCL 4 MG TABLET: 4 | 20 days supply | Qty: 20 | Fill #0

## 2020-05-05 MED FILL — ?OMEPRAZOLE 20 MG CPDR: 20 | 30 days supply | Qty: 30 | Fill #0

## 2020-05-05 MED FILL — ?IBUPROFEN 600 MG TABLETS: 600 | 10 days supply | Qty: 30 | Fill #0

## 2020-05-05 MED FILL — lamoTRIgine 100 MG TABS: 100 | 30 days supply | Qty: 90 | Fill #1

## 2020-06-01 ENCOUNTER — Other Ambulatory Visit (HOSPITAL_COMMUNITY): Payer: Self-pay | Admitting: Psychiatry

## 2020-06-01 DIAGNOSIS — F4310 Post-traumatic stress disorder, unspecified: Secondary | ICD-10-CM

## 2020-06-01 MED FILL — CYCLOBENZAPRINE 10 MG TAB: 10 | 30 days supply | Qty: 30 | Fill #1

## 2020-06-07 ENCOUNTER — Other Ambulatory Visit: Payer: Self-pay

## 2020-06-07 ENCOUNTER — Ambulatory Visit: Payer: Self-pay | Attending: Internal Medicine | Admitting: Family

## 2020-06-07 ENCOUNTER — Encounter: Payer: Self-pay | Admitting: Family

## 2020-06-07 VITALS — BP 136/82 | HR 75 | Resp 16 | Wt 183.4 lb

## 2020-06-07 DIAGNOSIS — R918 Other nonspecific abnormal finding of lung field: Secondary | ICD-10-CM

## 2020-06-07 DIAGNOSIS — F17200 Nicotine dependence, unspecified, uncomplicated: Secondary | ICD-10-CM

## 2020-06-07 MED FILL — lamoTRIgine 100 MG TABS: 100 | 30 days supply | Qty: 90 | Fill #2

## 2020-06-07 MED FILL — ?OMEPRAZOLE 20 MG CPDR: 20 | 30 days supply | Qty: 30 | Fill #1

## 2020-06-07 NOTE — Patient Instructions (Signed)
·   Follow-up with primary physician in 2 months or sooner if needed. Patient verbalized understanding.   Pulmonary Nodule A pulmonary nodule is tissue that has grown on your lung. A nodule may be cancer, but most nodules are not cancer. Follow these instructions at home:   Take over-the-counter and prescription medicines only as told by your doctor.  Do not use any products that have nicotine or tobacco, such as cigarettes and e-cigarettes. If you need help quitting, ask your doctor.  Keep all follow-up visits as told by your doctor. This is important. Contact a doctor if:  You have trouble breathing when doing activities.  You feel sick.  You feel more tired than normal.  You do not feel like eating.  You lose weight without trying.  You have chills.  You have night sweats. Get help right away if:  You cannot catch your breath.  You start making whistling sounds when breathing (wheezing).  You cannot stop coughing.  You cough up blood.  You get dizzy.  You feel like you are going to pass out (faint).  You have sudden chest pain.  You have a fever or symptoms for more than 2-3 days.  You have a fever and your symptoms suddenly get worse. Summary  A pulmonary nodule is tissue that has grown on your lung.  Most nodules are not cancer.  Your doctor will do tests to know what kind of nodule you have, and whether you need treatment for it. This information is not intended to replace advice given to you by your health care provider. Make sure you discuss any questions you have with your health care provider. Document Revised: 07/20/2017 Document Reviewed: 07/25/2016 Elsevier Patient Education  [employer].

## 2020-06-07 NOTE — Progress Notes (Signed)
Patient ID: Katie Sherman, female    DOB: 23-May-1966  MRN: [id number]  CC: Lung nodule follow-up  Subjective: Katie Sherman is a 54 y.o. female with history of bilateral carpal tunnel, adhesive capsulitis of left shoulder, depression, bipolar disorder depressed, insomnia due to other mental disorder, mixed hyperlipidemia, and vitamin D deficiency who presents for lung nodule follow-up.    1. LUNG NODULES FOLLOW-UP: 02/16/2020: Visit with Dr. Wynetta Emery. Strongly advised to quit smoking. Informed her that a repeat CAT scan in 6 to 12 months to assess for any changes in the size of the nodules.  06/07/2020: Today reports she would like to have a repeat CAT scan soon. Still smoking at least 1 pack of cigarettes daily since about 54 years old. No longer smoking what she refers to as [facility] dab pen, last time being January 2021. In August 2021 set a smoking quit date for 06/08/2020. Reports that her quit date is bad timing and that she is not ready to quit just yet. Denies interest in nicotine gum, nicotine patches, and oral medications. Says she had tried all of these in the past and prefers to quit cold [place]. Shortness of breath only with exertion. Denies coughing up blood, cough, chest pain, and chest pressure.    Patient Active Problem List   Diagnosis Date Noted  . Lung nodules 02/16/2020  . Vitamin D deficiency 02/19/2019  . Bilateral carpal tunnel syndrome 03/19/2018  . Mixed hyperlipidemia 11/14/2017  . 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 Postmenopausal 01/22/2017  . Bipolar 1 disorder, depressed ([place]) 12/29/2016  . Family history of suicide 11/20/2016  . Depression 11/20/2016  . Family history of breast cancer 11/20/2016  . Adhesive capsulitis of left shoulder 11/19/2016  . Greater trochanteric bursitis of right hip 11/19/2016     Current Outpatient Medications on File Prior to Visit  Medication Sig Dispense Refill  . cyclobenzaprine (FLEXERIL) 10 MG tablet  Take 1 tablet (10 mg total) by mouth daily as needed for muscle spasms. 30 tablet 2  . diclofenac sodium (VOLTAREN) 1 % GEL Apply 2 g topically 4 (four) times daily. 100 g 2  . ibuprofen (ADVIL) 600 MG tablet Take 1 tablet (600 mg total) by mouth every 8 (eight) hours as needed. 30 tablet 0  . lamoTRIgine (LAMICTAL) 100 MG tablet Take 3 tablets (300 mg total) by mouth daily. 90 tablet 2  . nitroGLYCERIN (NITRODUR - DOSED IN MG/24 HR) 0.2 mg/hr patch Use 1/4 patch daily to the affected area 30 patch 1  . omeprazole (PRILOSEC) 20 MG capsule Take 1 capsule (20 mg total) by mouth daily. 90 capsule 3  . ondansetron (ZOFRAN) 4 MG tablet Take 1 tablet (4 mg total) by mouth daily as needed for nausea or vomiting. 20 tablet 0  . penicillin v potassium (VEETID) 500 MG tablet Take 1 tablet (500 mg total) by mouth 4 (four) times daily. 28 tablet 0  . QUEtiapine (SEROQUEL) 300 MG tablet Take 1 tablet (300 mg total) by mouth at bedtime. 30 tablet 2  . traZODone (DESYREL) 50 MG tablet Take 1 tablet (50 mg total) by mouth at bedtime as needed for sleep. 30 tablet 1   No current facility-administered medications on file prior to visit.    No Known Allergies  Social History   Socioeconomic History  . Marital status: Widowed    Spouse name: Not on file  . Number of children: Not on file  . Years of education: Not on  file  . Highest education level: Not on file  Occupational History  . Occupation: unemployed  Tobacco Use  . Smoking status: Current Every Day Smoker    Packs/day: 1.00    Years: 36.00    Pack years: 36.00    Types: Cigarettes    Start date: 37  . Smokeless tobacco: Never Used  . Tobacco comment: 3 months without  Vaping Use  . Vaping Use: Never used  Substance and Sexual Activity  . Alcohol use: No  . Drug use: No    Types: Marijuana    Comment: stopped 04/06/2017  . Sexual activity: Not Currently    Birth control/protection: None  Other Topics Concern  . Not on file  Social  History Narrative   Prior traffic control specialist - unemployed now   Widow   5 children, 10 grandchildren   Moved here from [place] 2013   Social Determinants of Health   Financial Resource Strain:   . Difficulty of Paying Living Expenses: Not on file  Food Insecurity:   . Worried About Charity fundraiser in the Last Year: Not on file  . Ran Out of Food in the Last Year: Not on file  Transportation Needs:   . Lack of Transportation (Medical): Not on file  . Lack of Transportation (Non-Medical): Not on file  Physical Activity:   . Days of Exercise per Week: Not on file  . Minutes of Exercise per Session: Not on file  Stress:   . Feeling of Stress : Not on file  Social Connections:   . Frequency of Communication with Friends and Family: Not on file  . Frequency of Social Gatherings with Friends and Family: Not on file  . Attends Religious Services: Not on file  . Active Member of Clubs or Organizations: Not on file  . Attends Archivist Meetings: Not on file  . Marital Status: Not on file  Intimate Partner Violence:   . Fear of Current or Ex-Partner: Not on file  . Emotionally Abused: Not on file  . Physically Abused: Not on file  . Sexually Abused: Not on file    Family History  Problem Relation Age of Onset  . Heart attack Mother 63  . Heart disease Father   . Heart attack Father 75    Past Surgical History:  Procedure Laterality Date  . CERVICAL CONE BIOPSY    . TUBAL LIGATION      ROS: Review of Systems Negative except as stated above  PHYSICAL EXAM: LMP 04/29/2012    Vitals with BMI 06/07/2020 01/01/2020 01/23/2019  Height - - -  Weight 183 lbs 6 oz 184 lbs 10 oz -  BMI - - -  Systolic 633 354 562  Diastolic 82 84 61  Pulse 75 87 85  Some encounter information is confidential and restricted. Go to Review Flowsheets activity to see all data.   Wt Readings from Last 3 Encounters:  06/07/20 183 lb 6.4 oz (83.2 kg)  01/01/20 184 lb 9.6 oz  (83.7 kg)  06/04/18 206 lb (93.4 kg)    Physical Exam Constitutional:      Appearance: Normal appearance.  Cardiovascular:     Pulses: Normal pulses.     Heart sounds: Normal heart sounds.  Pulmonary:     Effort: Pulmonary effort is normal.     Breath sounds: Normal breath sounds.  Neurological:     General: No focal deficit present.     Mental Status: She is  alert and oriented to person, place, and time.  Psychiatric:        Mood and Affect: Mood normal.        Behavior: Behavior normal.    ASSESSMENT AND PLAN: 1. Lung nodules: - Patient in stable condition since last visit with primary physician in August 2021.  - Denies hemoptysis, cough, chest pain, and chest pressure. Shortness of breath only with exertion.  - Counseled patient that repeat CAT scan to assess changes in size of nodules is indicated in 6 to 12 months from last CAT scan (01/19/2020) four months ago. - Counseled patient should symptoms worsen and/over become severe such as but not limited to hemoptysis, shortness of breath not related to baseline, chest pain, and chest pressure that she should notify primary physician and seek immediate medical attention. Patient verbalized understanding. - Follow-up with primary physician in 2 months for repeat CAT scan or sooner if needed.  2. Tobacco dependence: - Reports still smoking at least 1 pack of cigarettes daily. No longer smoking what she refers to as [facility] dab pen, last time being January 2021.  - In August 2021 set a smoking quit date for 06/08/2020. Reports that her quit date is bad timing and that she is not ready to quit just yet.  - Denies interest in nicotine gum, nicotine patches, and oral medications. Says she had tried all of these in the past and prefers to quit cold [place].  - Counseled to quit.  Discussed health risk associated with smoking and cessation methods including NRT, Chantix, Buproprion.   Patient was given the opportunity to ask questions.   Patient verbalized understanding of the plan and was able to repeat key elements of the plan. Patient was given clear instructions to go to Emergency Department or return to medical center if symptoms don't improve, worsen, or new problems develop.The patient verbalized understanding.   No orders of the defined types were placed in this encounter.    Requested Prescriptions    No prescriptions requested or ordered in this encounter    No follow-ups on file.  Camillia Herter, NP

## 2020-06-08 ENCOUNTER — Telehealth (HOSPITAL_COMMUNITY): Payer: Self-pay | Admitting: Psychiatry

## 2020-06-08 ENCOUNTER — Other Ambulatory Visit (HOSPITAL_COMMUNITY): Payer: Self-pay | Admitting: Psychiatry

## 2020-06-08 ENCOUNTER — Telehealth (INDEPENDENT_AMBULATORY_CARE_PROVIDER_SITE_OTHER): Payer: Self-pay | Admitting: Psychiatry

## 2020-06-08 ENCOUNTER — Encounter (HOSPITAL_COMMUNITY): Payer: Self-pay | Admitting: Psychiatry

## 2020-06-08 VITALS — Wt 183.0 lb

## 2020-06-08 DIAGNOSIS — F319 Bipolar disorder, unspecified: Secondary | ICD-10-CM

## 2020-06-08 DIAGNOSIS — F4310 Post-traumatic stress disorder, unspecified: Secondary | ICD-10-CM

## 2020-06-08 DIAGNOSIS — F1210 Cannabis abuse, uncomplicated: Secondary | ICD-10-CM

## 2020-06-08 MED ORDER — QUETIAPINE FUMARATE 300 MG PO TABS: 300.0000 mg | ORAL_TABLET | Freq: Every day | ORAL | 2 refills | Status: DC

## 2020-06-08 MED ORDER — TRAZODONE HCL 50 MG PO TABS: 50.0000 mg | ORAL_TABLET | Freq: Every evening | ORAL | 1 refills | Status: DC | PRN

## 2020-06-08 MED ORDER — LAMOTRIGINE 100 MG PO TABS: 300.0000 mg | ORAL_TABLET | Freq: Every day | ORAL | 2 refills | Status: DC

## 2020-06-08 MED FILL — ?TRAZODONE HCL 50 TABS: 50 | 30 days supply | Qty: 30 | Fill #0

## 2020-06-08 MED FILL — QUETIAPINE FUMARATE 300 MG: 300 | 30 days supply | Qty: 30 | Fill #0
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
## 2020-06-08 NOTE — Telephone Encounter (Signed)
06/08/20 10:49am  Spoke with patient in reference to not having insurance on file. Reminded patient that we last spoke in August about having no insurance, the pt stated that she is the process of getting [facility] assistance and when she get the letter she will let there office know.Marland KitchenMariana Sherman

## 2020-07-06 ENCOUNTER — Other Ambulatory Visit: Payer: Self-pay | Admitting: Family Medicine

## 2020-07-06 ENCOUNTER — Encounter: Payer: Self-pay | Admitting: Internal Medicine

## 2020-07-06 ENCOUNTER — Telehealth: Payer: Self-pay | Admitting: Internal Medicine

## 2020-07-06 ENCOUNTER — Telehealth: Payer: Self-pay | Admitting: Physician Assistant

## 2020-07-06 DIAGNOSIS — L03115 Cellulitis of right lower limb: Secondary | ICD-10-CM

## 2020-07-06 MED ORDER — CEPHALEXIN 500 MG PO CAPS
500.0000 mg | ORAL_CAPSULE | Freq: Four times a day (QID) | ORAL | 0 refills | Status: DC
Start: 2020-07-06 — End: 2020-07-06

## 2020-07-06 MED FILL — CEPHALEXIN 500 MG CAPSULE: 500 | 10 days supply | Qty: 40 | Fill #0

## 2020-07-06 NOTE — Progress Notes (Signed)
E Visit for Cellulitis  We are sorry that you are not feeling well. Here is how we plan to help!  Based on what you shared with me it looks like you have cellulitis.  Cellulitis looks like areas of skin redness, swelling, and warmth; it develops as a result of bacteria entering under the skin. Little red spots and/or bleeding can be seen in skin, and tiny surface sacs containing fluid can occur. Fever can be present. Cellulitis is almost always on one side of a body, and the lower limbs are the most common site of involvement.   I have prescribed:  Keflex 500mg  take one by mouth four times a day for 10 days   If you are not improved in 24-48 hours you will need a face to face visit for further evaluation.  HOME CARE:  . Take your medications as ordered and take all of them, even if the skin irritation appears to be healing.   GET HELP RIGHT AWAY IF:  . Symptoms that don't begin to go away within 48 hours. . Severe redness persists or worsens . If the area turns color, spreads or swells. . If it blisters and opens, develops yellow-brown crust or bleeds. . You develop a fever or chills. . If the pain increases or becomes unbearable.  . Are unable to keep fluids and food down.  MAKE SURE YOU    Understand these instructions.  Will watch your condition.  Will get help right away if you are not doing well or get worse.  Thank you for choosing an e-visit. Your e-visit answers were reviewed by a board certified advanced clinical practitioner to complete your personal care plan. Depending upon the condition, your plan could have included both over the counter or prescription medications. Please review your pharmacy choice. Make sure the pharmacy is open so you can pick up prescription now. If there is a problem, you may contact your provider through [phone] and have the prescription routed to another pharmacy. Your safety is important to [employer]. If you have drug allergies check your  prescription carefully.  For the next 24 hours you can use MyChart to ask questions about today's visit, request a non-urgent call back, or ask for a work or school excuse. You will get an email in the next two days asking about your experience. I hope that your e-visit has been valuable and will speed your recovery.  Greater than 5 minutes, yet less than 10 minutes of time have been spent researching, coordinating, and implementing care for this patient today

## 2020-07-06 NOTE — Telephone Encounter (Signed)
Copied from CRM [id number]. Topic: General - Other >> Jul 06, 2020 10:06 AM Jaquita Rector A wrote: Reason for CRM: Patient called in to inform Dr Laural Benes that she have an open sore on her right leg that is very painful sore on her right leg. Will send pictures in her My Chart please advise  Ph# [phone]   Just an FYI.

## 2020-07-07 NOTE — Telephone Encounter (Signed)
FYI  Returned pt call. Pt states she did a E-Visit yesterday and the provider wrote her a rx for Keflex 500Mg  Capsule. Pt states the swelling has started to go down. Pt doesn't have any other concerns or questions

## 2020-07-18 ENCOUNTER — Other Ambulatory Visit: Payer: Self-pay | Admitting: Nurse Practitioner

## 2020-07-18 ENCOUNTER — Telehealth: Payer: Self-pay | Admitting: Nurse Practitioner

## 2020-07-18 DIAGNOSIS — B029 Zoster without complications: Secondary | ICD-10-CM

## 2020-07-18 MED ORDER — VALACYCLOVIR HCL 1 G PO TABS: 1000.0000 mg | ORAL_TABLET | Freq: Three times a day (TID) | ORAL | 0 refills | Status: DC

## 2020-07-18 MED ORDER — GABAPENTIN 300 MG PO CAPS: 300.0000 mg | ORAL_CAPSULE | Freq: Two times a day (BID) | ORAL | 0 refills | Status: DC

## 2020-07-18 NOTE — Progress Notes (Signed)
E-visit for Shingles   We are sorry that you are not feeling well. Here is how we plan to help!  Based on what you shared with me it looks like you have shingles.  Shingles or herpes zoster, is a common infection of the nerves.  It is a painful rash caused by the herpes zoster virus.  This is the same virus that causes chickenpox.  After a person has chickenpox, the virus remains inactive in the nerve cells.  Years later, the virus can become active again and travel to the skin.  It typically will appear on one side of the face or body.  Burning or shooting pain, tingling, or itching are early signs of the infection.  Blisters typically scab over in 7 to 10 days and clear up within 2-4 weeks. Shingles is only contagious to people that have never had the chickenpox, the chickenpox vaccine, or anyone who has a compromised immune system.  You should avoid contact with these type of people until your blisters scab over.  I have prescribed Valacyclovir 1g three times daily for 7 days and also Gabapentin 300mg  twice daily as needed for pain   HOME CARE: . Apply ice packs (wrapped in a thin towel), cool compresses, or soak in cool bath to help reduce pain. . Use calamine lotion to calm itchy skin. . Avoid scratching the rash. . Avoid direct sunlight.  GET HELP RIGHT AWAY IF: . Symptoms that don't away after treatment. . A rash or blisters near your eye. . Increased drainage, fever, or rash after treatment. . Severe pain that doesn't go away.   MAKE SURE YOU    Understand these instructions.  Will watch your condition.  Will get help right away if you are not doing well or get worse.  Thank you for choosing an e-visit. Your e-visit answers were reviewed by a board certified advanced clinical practitioner to complete your personal care plan. Depending upon the condition, your plan could have included both over the counter or prescription medications.  Please review your pharmacy choice.  Make sure the pharmacy is open so you can pick up prescription now. If there is a problem, you may contact your provider through [employer] and have the prescription routed to another pharmacy.  Your safety is important to [place]. If you have drug allergies check your prescription carefully.   For the next 24 hours you can use MyChart to ask questions about today's visit, request a non-urgent call back, or ask for a work or school excuse.  You will get an email in the next two days asking about your experience. I hope that your e-visit has been valuable and will speed your recovery.  I have spent at least 5 minutes reviewing and documenting in the patient's chart.

## 2020-07-19 MED FILL — GABAPENTIN 300 MG CAPSULE: 300 | 10 days supply | Qty: 20 | Fill #0

## 2020-07-19 MED FILL — ?VALACYCLOVIR HCL 1 GM TABS: 1 | 7 days supply | Qty: 21 | Fill #0

## 2020-07-22 MED FILL — lamoTRIgine 100 MG TABS: 100 | 30 days supply | Qty: 90 | Fill #0

## 2020-07-22 MED FILL — ?OMEPRAZOLE 20 MG CPDR: 20 | 30 days supply | Qty: 30 | Fill #2

## 2020-07-22 MED FILL — ?TRAZODONE HCL 50 TABS: 50 | 30 days supply | Qty: 30 | Fill #1

## 2020-07-31 ENCOUNTER — Other Ambulatory Visit: Payer: Self-pay | Admitting: Internal Medicine

## 2020-07-31 DIAGNOSIS — R112 Nausea with vomiting, unspecified: Secondary | ICD-10-CM

## 2020-08-01 ENCOUNTER — Other Ambulatory Visit: Payer: Self-pay | Admitting: Internal Medicine

## 2020-08-01 MED ORDER — ONDANSETRON HCL 4 MG PO TABS: 4.0000 mg | ORAL_TABLET | Freq: Every day | ORAL | 0 refills | Status: DC | PRN

## 2020-08-02 MED FILL — ONDANSETRON HCL 4 MG TABLET: 4 | 20 days supply | Qty: 20 | Fill #0

## 2020-08-05 ENCOUNTER — Other Ambulatory Visit: Payer: Self-pay | Admitting: Internal Medicine

## 2020-08-05 ENCOUNTER — Encounter: Payer: Self-pay | Admitting: Internal Medicine

## 2020-08-05 ENCOUNTER — Other Ambulatory Visit: Payer: Self-pay

## 2020-08-05 ENCOUNTER — Ambulatory Visit: Payer: Self-pay | Attending: Internal Medicine | Admitting: Internal Medicine

## 2020-08-05 VITALS — BP 140/90 | HR 100 | Temp 98.4°F | Resp 16 | Wt 173.8 lb

## 2020-08-05 DIAGNOSIS — H6691 Otitis media, unspecified, right ear: Secondary | ICD-10-CM

## 2020-08-05 DIAGNOSIS — Z2881 Immunization not carried out due to patient having had the disease: Secondary | ICD-10-CM

## 2020-08-05 DIAGNOSIS — J988 Other specified respiratory disorders: Secondary | ICD-10-CM

## 2020-08-05 DIAGNOSIS — R109 Unspecified abdominal pain: Secondary | ICD-10-CM

## 2020-08-05 DIAGNOSIS — R918 Other nonspecific abnormal finding of lung field: Secondary | ICD-10-CM

## 2020-08-05 DIAGNOSIS — R030 Elevated blood-pressure reading, without diagnosis of hypertension: Secondary | ICD-10-CM

## 2020-08-05 DIAGNOSIS — B9789 Other viral agents as the cause of diseases classified elsewhere: Secondary | ICD-10-CM

## 2020-08-05 DIAGNOSIS — F17200 Nicotine dependence, unspecified, uncomplicated: Secondary | ICD-10-CM

## 2020-08-05 MED ORDER — BENZONATATE 100 MG PO CAPS: 100.0000 mg | ORAL_CAPSULE | Freq: Two times a day (BID) | ORAL | 0 refills | Status: DC | PRN

## 2020-08-05 MED ORDER — AMOXICILLIN 500 MG PO CAPS: 500.0000 mg | ORAL_CAPSULE | Freq: Three times a day (TID) | ORAL | 0 refills | Status: DC

## 2020-08-05 MED FILL — BENZONATATE 100 MG CAPS: 100 | 20 days supply | Qty: 20 | Fill #0

## 2020-08-05 MED FILL — AMOXICILLIN 500 MG CAPSULE: 500 | 21 days supply | Qty: 21 | Fill #0

## 2020-08-05 NOTE — Patient Instructions (Signed)
I strongly encourage you to discontinue smoking.  Smoking increases your risk for lung cancer and other types of cancers.  I would recommend that you quarantine at home until the results of the COVID-19 test comes back.  I have sent the prescription to the pharmacy for amoxicillin antibiotics for your ear and some cough tablets to use as needed.  We have ordered a follow-up CAT scan of your chest.  Your blood pressure is elevated.  Try to limit salt in the foods as much as possible.  We will recheck your blood pressure on follow-up visit.
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
## 2020-08-07 ENCOUNTER — Encounter: Payer: Self-pay | Admitting: Internal Medicine

## 2020-08-07 ENCOUNTER — Telehealth: Payer: Self-pay | Admitting: Internal Medicine

## 2020-08-07 DIAGNOSIS — F17200 Nicotine dependence, unspecified, uncomplicated: Secondary | ICD-10-CM

## 2020-08-07 DIAGNOSIS — U071 COVID-19: Secondary | ICD-10-CM

## 2020-08-07 LAB — NOVEL CORONAVIRUS, NAA: SARS-CoV-2, NAA: DETECTED — AB

## 2020-08-07 LAB — SARS-COV-2, NAA 2 DAY TAT

## 2020-08-07 MED ORDER — ALBUTEROL SULFATE HFA 108 (90 BASE) MCG/ACT IN AERS
2.0000 | INHALATION_SPRAY | Freq: Four times a day (QID) | RESPIRATORY_TRACT | 2 refills | Status: DC | PRN
Start: 2020-08-07 — End: 2021-07-12

## 2020-08-07 NOTE — Telephone Encounter (Signed)
Phone call placed to patient this afternoon to inform her that her Covid test came back positive.  Patient states that she saw the results on my chart.  She states that she is still having a little cough but no fever.  She is experiencing some shortness of breath and dizziness.  Dizziness was worse last night but not as bad today.  She is taking the [employer] for the cough.  She is unvaccinated.  She is wanting to know if there is any medication that I can give her like monoclonal antibodies for her symptoms. Advised patient that if her breathing is labored she should be seen in the emergency room.  States she does not feel she is at that point as yet.  I will send prescription to the pharmacy for her for albuterol inhaler and will submit her name to the infusion team for them to decide whether she can receive monoclonal antibodies or antiviral oral medication.  In the meantime I have informed patient to quarantine for at least 5 days providing her symptoms are improving and she is fever free without having to use antipyretics for 24 to 48 hours prior.  She tells me she lives alone.  I told her that if by any chance there is anyone else in the house she should wear a mask when around them for at least 10 days.

## 2020-08-09 ENCOUNTER — Encounter: Payer: Self-pay | Admitting: Internal Medicine

## 2020-08-12 MED FILL — CYCLOBENZAPRINE 10 MG TAB: 10 | 30 days supply | Qty: 30 | Fill #2

## 2020-08-19 ENCOUNTER — Ambulatory Visit (HOSPITAL_COMMUNITY)
Admission: RE | Admit: 2020-08-19 | Discharge: 2020-08-19 | Disposition: A | Discharge status: Home / Self Care | Payer: Self-pay | Source: Ambulatory Visit | Attending: Internal Medicine | Admitting: Internal Medicine

## 2020-08-19 ENCOUNTER — Other Ambulatory Visit: Payer: Self-pay

## 2020-08-19 DIAGNOSIS — R918 Other nonspecific abnormal finding of lung field: Secondary | ICD-10-CM | POA: Insufficient documentation

## 2020-08-20 ENCOUNTER — Telehealth: Payer: Self-pay | Admitting: Internal Medicine

## 2020-08-20 NOTE — Telephone Encounter (Signed)
Called patient to schedule an appointment with Dr. Joya Gaskins for lung nodules and COVID pneumonia. Connected with patient and she stated she would call back later to schedule.

## 2020-08-23 ENCOUNTER — Encounter: Payer: Self-pay | Admitting: Internal Medicine

## 2020-08-25 ENCOUNTER — Telehealth: Payer: Self-pay | Admitting: Internal Medicine

## 2020-08-25 ENCOUNTER — Encounter: Payer: Self-pay | Admitting: Internal Medicine

## 2020-08-25 NOTE — Telephone Encounter (Signed)
PC placed to Dr. Nyoka Cowden the radiologist who read pt's recent CT of chest. I asked about the statement in his report of there being a thoracic aneurysm.  He stated that it was a typographical error.  Should read that there is no evidence of aneurysm.  He will make the correction and resubmit report. I will notify pt once I get updated report.

## 2020-08-25 NOTE — Telephone Encounter (Signed)
Contacted pt to make aware that March 14 is Dr. Joya Gaskins next available. Made pt aware that I made Dr. Wynetta Emery aware when provider next appt was and provider was fine with the date... Pt states she understands and doesn't have any questions or concerns

## 2020-09-02 MED FILL — lamoTRIgine 100 MG TABS: 100 | 30 days supply | Qty: 90 | Fill #1

## 2020-09-02 MED FILL — ?OMEPRAZOLE 20 MG CPDR: 20 | 30 days supply | Qty: 30 | Fill #3

## 2020-09-06 ENCOUNTER — Other Ambulatory Visit: Payer: Self-pay

## 2020-09-06 ENCOUNTER — Telehealth (INDEPENDENT_AMBULATORY_CARE_PROVIDER_SITE_OTHER): Payer: Self-pay | Admitting: Psychiatry

## 2020-09-06 ENCOUNTER — Other Ambulatory Visit (HOSPITAL_COMMUNITY): Payer: Self-pay | Admitting: Psychiatry

## 2020-09-06 ENCOUNTER — Encounter (HOSPITAL_COMMUNITY): Payer: Self-pay | Admitting: Psychiatry

## 2020-09-06 VITALS — Wt 173.0 lb

## 2020-09-06 DIAGNOSIS — F319 Bipolar disorder, unspecified: Secondary | ICD-10-CM

## 2020-09-06 DIAGNOSIS — F4310 Post-traumatic stress disorder, unspecified: Secondary | ICD-10-CM

## 2020-09-06 DIAGNOSIS — F1210 Cannabis abuse, uncomplicated: Secondary | ICD-10-CM

## 2020-09-06 MED ORDER — TRAZODONE HCL 50 MG PO TABS: 50.0000 mg | ORAL_TABLET | Freq: Every evening | ORAL | 1 refills | Status: DC | PRN

## 2020-09-06 MED ORDER — LAMOTRIGINE 100 MG PO TABS: 300.0000 mg | ORAL_TABLET | Freq: Every day | ORAL | 1 refills | Status: DC

## 2020-09-06 MED ORDER — QUETIAPINE FUMARATE 200 MG PO TABS: 200.0000 mg | ORAL_TABLET | Freq: Every day | ORAL | 1 refills | Status: DC

## 2020-09-06 MED ORDER — GABAPENTIN 400 MG PO CAPS: 400.0000 mg | ORAL_CAPSULE | Freq: Two times a day (BID) | ORAL | 1 refills | Status: DC

## 2020-09-06 MED FILL — GABAPENTIN 400 MG CAPSULE: 400 | 30 days supply | Qty: 60 | Fill #0

## 2020-09-06 MED FILL — traZODone HCL 50 MG TABS: 50 | 30 days supply | Qty: 30 | Fill #0

## 2020-09-06 MED FILL — QUETIAPINE FUMARATE 200 MG: 200 | 30 days supply | Qty: 30 | Fill #0
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
## 2020-09-15 ENCOUNTER — Encounter: Payer: Self-pay | Admitting: Internal Medicine

## 2020-09-15 ENCOUNTER — Other Ambulatory Visit: Payer: Self-pay | Admitting: Internal Medicine

## 2020-09-15 DIAGNOSIS — M62830 Muscle spasm of back: Secondary | ICD-10-CM

## 2020-09-15 MED ORDER — CYCLOBENZAPRINE HCL 10 MG PO TABS: 10.0000 mg | ORAL_TABLET | Freq: Every day | ORAL | 2 refills | Status: DC | PRN

## 2020-09-16 MED FILL — CYCLOBENZAPRINE 10 MG TAB: 10 | 30 days supply | Qty: 30 | Fill #0

## 2020-09-19 NOTE — Progress Notes (Unsigned)
   Subjective:    Patient ID: Katie Sherman, female    DOB: [birth year], 55 y.o.   MRN: [id number]  55 y.o.F ref from Dr Ladean Raya Covid with pulm nodules.       Review of Systems     Objective:   Physical Exam        Assessment & Plan:

## 2020-09-20 ENCOUNTER — Other Ambulatory Visit: Payer: Self-pay

## 2020-09-20 ENCOUNTER — Ambulatory Visit: Payer: Self-pay | Attending: Critical Care Medicine | Admitting: Critical Care Medicine

## 2020-10-01 MED FILL — traZODone HCL 50 MG TABS: 50 | 30 days supply | Qty: 30 | Fill #1

## 2020-10-04 MED FILL — GABAPENTIN 400 MG CAPSULE: 400 | 30 days supply | Qty: 60 | Fill #1

## 2020-10-05 MED FILL — ?OMEPRAZOLE 20 MG CPDR: 20 | 30 days supply | Qty: 30 | Fill #4

## 2020-10-05 MED FILL — lamoTRIgine 100 MG TABS: 100 | 30 days supply | Qty: 90 | Fill #2

## 2020-10-09 ENCOUNTER — Other Ambulatory Visit: Payer: Self-pay

## 2020-10-14 ENCOUNTER — Other Ambulatory Visit: Payer: Self-pay

## 2020-10-14 MED FILL — Cyclobenzaprine HCl Tab 10 MG: ORAL | 30 days supply | Qty: 30 | Fill #0 | Status: AC

## 2020-10-19 ENCOUNTER — Other Ambulatory Visit: Payer: Self-pay

## 2020-11-01 ENCOUNTER — Other Ambulatory Visit: Payer: Self-pay

## 2020-11-01 ENCOUNTER — Telehealth (INDEPENDENT_AMBULATORY_CARE_PROVIDER_SITE_OTHER): Payer: Self-pay | Admitting: Psychiatry

## 2020-11-01 DIAGNOSIS — F4310 Post-traumatic stress disorder, unspecified: Secondary | ICD-10-CM

## 2020-11-01 DIAGNOSIS — F319 Bipolar disorder, unspecified: Secondary | ICD-10-CM

## 2020-11-01 MED ORDER — GABAPENTIN 400 MG PO CAPS
ORAL_CAPSULE | ORAL | 1 refills | Status: DC
  Filled 2020-11-01: qty 60, 30d supply, fill #0
  Filled 2020-12-03: qty 60, 30d supply, fill #1

## 2020-11-01 MED ORDER — QUETIAPINE FUMARATE 200 MG PO TABS
ORAL_TABLET | Freq: Every day | ORAL | 1 refills | Status: DC
  Filled 2020-11-01: qty 30, 30d supply, fill #0
  Filled 2020-12-03: qty 30, 30d supply, fill #1

## 2020-11-01 MED ORDER — LAMOTRIGINE 100 MG PO TABS
ORAL_TABLET | ORAL | 1 refills | Status: DC
  Filled 2020-11-01: qty 90, 30d supply, fill #0
  Filled 2020-12-08 (×2): qty 90, 30d supply, fill #1

## 2020-11-01 MED ORDER — TRAZODONE HCL 50 MG PO TABS
ORAL_TABLET | Freq: Every evening | ORAL | 1 refills | Status: DC | PRN
  Filled 2020-11-01: qty 30, 30d supply, fill #0
  Filled 2020-12-03: qty 30, 30d supply, fill #1
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
## 2020-11-02 ENCOUNTER — Other Ambulatory Visit: Payer: Self-pay

## 2020-11-04 ENCOUNTER — Other Ambulatory Visit: Payer: Self-pay

## 2020-11-08 ENCOUNTER — Encounter: Payer: Self-pay | Admitting: Internal Medicine

## 2020-11-08 ENCOUNTER — Telehealth: Payer: Self-pay | Admitting: Internal Medicine

## 2020-11-08 MED FILL — Cyclobenzaprine HCl Tab 10 MG: ORAL | 30 days supply | Qty: 30 | Fill #1 | Status: AC

## 2020-11-08 NOTE — Telephone Encounter (Signed)
Patient called to request a referral to the GI doctor, Zenovia Jarred, who takes her insurance.  Patient also needs a referral to an ortho doctor.  Patient stated she had a referral last year but was not able to see the doctor.  Please advise and confirm with patient once the referral has been placed.  CB# [id number]

## 2020-11-09 ENCOUNTER — Other Ambulatory Visit: Payer: Self-pay

## 2020-11-09 NOTE — Telephone Encounter (Signed)
Waiting for pt to respond MyChart message

## 2020-11-10 ENCOUNTER — Other Ambulatory Visit: Payer: Self-pay

## 2020-11-11 ENCOUNTER — Encounter: Payer: Self-pay | Admitting: Internal Medicine

## 2020-11-12 ENCOUNTER — Other Ambulatory Visit: Payer: Self-pay

## 2020-11-12 ENCOUNTER — Ambulatory Visit: Payer: Self-pay | Attending: Internal Medicine | Admitting: Internal Medicine

## 2020-11-12 DIAGNOSIS — Z1211 Encounter for screening for malignant neoplasm of colon: Secondary | ICD-10-CM

## 2020-11-12 DIAGNOSIS — I700 Atherosclerosis of aorta: Secondary | ICD-10-CM

## 2020-11-12 DIAGNOSIS — M25511 Pain in right shoulder: Secondary | ICD-10-CM

## 2020-11-12 DIAGNOSIS — M7061 Trochanteric bursitis, right hip: Secondary | ICD-10-CM

## 2020-11-12 DIAGNOSIS — G5603 Carpal tunnel syndrome, bilateral upper limbs: Secondary | ICD-10-CM

## 2020-11-12 DIAGNOSIS — F17200 Nicotine dependence, unspecified, uncomplicated: Secondary | ICD-10-CM

## 2020-11-12 MED ORDER — PRAVASTATIN SODIUM 20 MG PO TABS
20.0000 mg | ORAL_TABLET | Freq: Every day | ORAL | 3 refills | Status: DC
  Filled 2020-11-12 – 2020-12-07 (×2): qty 30, 30d supply, fill #0
  Filled 2021-01-26 – 2021-03-04 (×2): qty 30, 30d supply, fill #1
  Filled 2021-04-11: qty 30, 30d supply, fill #2
  Filled 2021-05-22: qty 30, 30d supply, fill #3

## 2020-11-12 NOTE — Progress Notes (Signed)
Virtual Visit via Telephone Note  I connected with [name] on 11/12/2020 at 1:25 p.m by telephone and verified that I am speaking with the correct person using two identifiers  Location: Patient: home Provider: office  Participants: Myself Patient RN Jeremy Johann    I discussed the limitations, risks, security and privacy concerns of performing an evaluation and management service by telephone and the availability of in person appointments. I also discussed with the patient that there may be a patient responsible charge related to this service. The patient expressed understanding and agreed to proceed.   History of Present Illness: Pt with hx of tob dep, lung nodules (stable on repeat CT 08/2020) GERD, HL, bipolar 1, chronic LT shoulder and Rt hip pain.  Requesting referral to GI now that she has medicaid. Needs c-scope for colon cancer screening Also wonders whether she needs to be seen for gallstones that were incidentally seen on CT of the chest.  No abdominal pain with food.  Incidental finding on CT also showed aortic atherosclerosis.  She is not on statin therapy.  Tob dep: wants to quit but having problems putting them down.  Had repeat CAT scan of the chest back in February around the time that she had COVID.  Bilateral lung nodules were stable.  Hx of CTS.  Had EMG 2019 that revealed mild disease.. Symptoms worsened over the past several months with pain and numbness in her hands especially the right.  Would like referral to Ortho   Request referral to ortho for RT hip pain. Lateral hip.  Dx with bursitis in past.  Feels symptoms have returned.  Given Nitro patches in past by ortho which helped  In 2018 had adhesive capsulitis RT shoulder.  At that time she was having problems with range of motion of the shoulder.  It got better.  However over the past several months she has had recurrence of symptoms where she is having limited range of motion with pain.  No initiating  factors.   Outpatient Encounter Medications as of 11/12/2020  Medication Sig  . albuterol (VENTOLIN HFA) 108 (90 Base) MCG/ACT inhaler Inhale 2 puffs into the lungs every 6 (six) hours as needed for wheezing or shortness of breath.  . cyclobenzaprine (FLEXERIL) 10 MG tablet TAKE 1 TABLET (10 MG TOTAL) BY MOUTH DAILY AS NEEDED FOR MUSCLE SPASMS.  Marland Kitchen gabapentin (NEURONTIN) 400 MG capsule TAKE 1 CAPSULE (400 MG TOTAL) BY MOUTH 2 (TWO) TIMES DAILY.  Marland Kitchen ibuprofen (ADVIL) 600 MG tablet Take 1 tablet (600 mg total) by mouth every 8 (eight) hours as needed.  . lamoTRIgine (LAMICTAL) 100 MG tablet TAKE 3 TABLETS (300 MG TOTAL) BY MOUTH DAILY.  Marland Kitchen omeprazole (PRILOSEC) 20 MG capsule Take 1 capsule (20 mg total) by mouth daily.  . ondansetron (ZOFRAN) 4 MG tablet TAKE 1 TABLET (4 MG TOTAL) BY MOUTH DAILY AS NEEDED FOR NAUSEA OR VOMITING.  . QUEtiapine (SEROQUEL) 200 MG tablet TAKE 1 TABLET (200 MG TOTAL) BY MOUTH AT BEDTIME.  . traZODone (DESYREL) 50 MG tablet TAKE 1 TABLET (50 MG TOTAL) BY MOUTH AT BEDTIME AS NEEDED FOR SLEEP.  Marland Kitchen valACYclovir (VALTREX) 1000 MG tablet TAKE 1 TABLET (1,000 MG TOTAL) BY MOUTH 3 (THREE) TIMES DAILY FOR 7 DAYS.  . cephALEXin (KEFLEX) 500 MG capsule TAKE 1 CAPSULE (500 MG TOTAL) BY MOUTH 4 (FOUR) TIMES DAILY FOR 10 DAYS. (Patient not taking: Reported on 11/12/2020)   No facility-administered encounter medications on file as of 11/12/2020.  Observations/Objective: No direct observation done as this was a telephone encounter.  Assessment and Plan: 1. Bilateral carpal tunnel syndrome - Ambulatory referral to Sports Medicine  2. Screening for colon cancer - Ambulatory referral to Gastroenterology  3. Tobacco dependence Strongly advised to quit.  She is aware of health risks associated with smoking and given her recent health scare with the lung nodules.  She states that she is working on it.  4. Greater trochanteric bursitis of right hip - Ambulatory referral to Sports  Medicine  5. Right shoulder pain, unspecified chronicity - Ambulatory referral to Sports Medicine  6. Aortic atherosclerosis ([place]) Advised to quit smoking.  Recommend statin therapy.  She will come to the lab first to have LFTs and lipid profile. - Hepatic function panel; Future - Lipid panel; Future - pravastatin (PRAVACHOL) 20 MG tablet; Take 1 tablet (20 mg total) by mouth daily.  Dispense: 30 tablet; Refill: 3   Follow Up Instructions: 4 mths   I discussed the assessment and treatment plan with the patient. The patient was provided an opportunity to ask questions and all were answered. The patient agreed with the plan and demonstrated an understanding of the instructions.   The patient was advised to call back or seek an in-person evaluation if the symptoms worsen or if the condition fails to improve as anticipated.  I  Spent 15  minutes on this telephone encounter  Karle Plumber, MD

## 2020-11-15 ENCOUNTER — Other Ambulatory Visit: Payer: Self-pay

## 2020-11-15 MED FILL — Valacyclovir HCl Tab 1 GM: ORAL | 7 days supply | Qty: 21 | Fill #0 | Status: CN

## 2020-11-19 ENCOUNTER — Ambulatory Visit: Payer: Medicaid Other | Admitting: Family Medicine

## 2020-11-22 ENCOUNTER — Other Ambulatory Visit: Payer: Self-pay

## 2020-12-03 ENCOUNTER — Other Ambulatory Visit: Payer: Self-pay | Admitting: Internal Medicine

## 2020-12-03 ENCOUNTER — Other Ambulatory Visit: Payer: Self-pay

## 2020-12-03 DIAGNOSIS — M62830 Muscle spasm of back: Secondary | ICD-10-CM

## 2020-12-07 ENCOUNTER — Other Ambulatory Visit: Payer: Self-pay

## 2020-12-07 ENCOUNTER — Ambulatory Visit: Payer: Self-pay | Attending: Internal Medicine

## 2020-12-07 DIAGNOSIS — I700 Atherosclerosis of aorta: Secondary | ICD-10-CM

## 2020-12-08 ENCOUNTER — Other Ambulatory Visit: Payer: Self-pay

## 2020-12-08 ENCOUNTER — Encounter: Payer: Self-pay | Admitting: Internal Medicine

## 2020-12-08 ENCOUNTER — Other Ambulatory Visit: Payer: Self-pay | Admitting: Internal Medicine

## 2020-12-08 DIAGNOSIS — M62830 Muscle spasm of back: Secondary | ICD-10-CM

## 2020-12-08 LAB — HEPATIC FUNCTION PANEL
ALT: 12 IU/L (ref 0–32)
AST: 17 IU/L (ref 0–40)
Albumin: 4.5 g/dL (ref 3.8–4.9)
Alkaline Phosphatase: 122 IU/L — ABNORMAL HIGH (ref 44–121)
Bilirubin Total: 0.2 mg/dL (ref 0.0–1.2)
Bilirubin, Direct: <0.1 mg/dL (ref 0.00–0.40)
Total Protein: 7 g/dL (ref 6.0–8.5)

## 2020-12-08 LAB — LIPID PANEL
Chol/HDL Ratio: 4.1 ratio (ref 0.0–4.4)
Cholesterol, Total: 252 mg/dL — ABNORMAL HIGH (ref 100–199)
HDL: 62 mg/dL (ref >39)
LDL Chol Calc (NIH): 152 mg/dL — ABNORMAL HIGH (ref 0–99)
Triglycerides: 212 mg/dL — ABNORMAL HIGH (ref 0–149)
VLDL Cholesterol Cal: 38 mg/dL (ref 5–40)

## 2020-12-08 MED ORDER — CYCLOBENZAPRINE HCL 10 MG PO TABS
ORAL_TABLET | ORAL | 2 refills | Status: DC
  Filled 2020-12-08: qty 30, 30d supply, fill #0
  Filled 2021-01-08: qty 30, 30d supply, fill #1

## 2020-12-09 ENCOUNTER — Other Ambulatory Visit: Payer: Self-pay

## 2020-12-13 ENCOUNTER — Other Ambulatory Visit: Payer: Self-pay

## 2020-12-30 ENCOUNTER — Encounter (HOSPITAL_COMMUNITY): Payer: Self-pay | Admitting: Psychiatry

## 2020-12-30 ENCOUNTER — Telehealth (INDEPENDENT_AMBULATORY_CARE_PROVIDER_SITE_OTHER): Payer: Self-pay | Admitting: Psychiatry

## 2020-12-30 ENCOUNTER — Other Ambulatory Visit: Payer: Self-pay

## 2020-12-30 VITALS — Wt 179.0 lb

## 2020-12-30 DIAGNOSIS — F4310 Post-traumatic stress disorder, unspecified: Secondary | ICD-10-CM

## 2020-12-30 DIAGNOSIS — F319 Bipolar disorder, unspecified: Secondary | ICD-10-CM

## 2020-12-30 MED ORDER — VORTIOXETINE HBR 10 MG PO TABS
10.0000 mg | ORAL_TABLET | Freq: Every day | ORAL | 0 refills | Status: DC
Start: 2020-12-30 — End: 2021-01-11

## 2020-12-30 MED ORDER — GABAPENTIN 400 MG PO CAPS
ORAL_CAPSULE | ORAL | 0 refills | Status: DC
  Filled 2020-12-30: qty 60, 30d supply, fill #0

## 2020-12-30 MED ORDER — LAMOTRIGINE 100 MG PO TABS
ORAL_TABLET | ORAL | 0 refills | Status: DC
  Filled 2020-12-30: qty 90, 30d supply, fill #0

## 2020-12-30 MED ORDER — QUETIAPINE FUMARATE 200 MG PO TABS
ORAL_TABLET | Freq: Every day | ORAL | 0 refills | Status: DC
Start: 2020-12-30 — End: 2021-02-01
  Filled 2020-12-30: qty 30, 30d supply, fill #0

## 2020-12-30 MED ORDER — TRAZODONE HCL 50 MG PO TABS
ORAL_TABLET | Freq: Every evening | ORAL | 0 refills | Status: DC | PRN
  Filled 2020-12-30: qty 30, 30d supply, fill #0
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
## 2020-12-31 ENCOUNTER — Other Ambulatory Visit: Payer: Self-pay

## 2020-12-31 ENCOUNTER — Ambulatory Visit: Payer: Self-pay | Attending: Internal Medicine | Admitting: Internal Medicine

## 2020-12-31 ENCOUNTER — Telehealth: Payer: Self-pay | Admitting: Internal Medicine

## 2020-12-31 ENCOUNTER — Encounter: Payer: Self-pay | Admitting: Internal Medicine

## 2020-12-31 VITALS — BP 158/92 | HR 89

## 2020-12-31 DIAGNOSIS — M542 Cervicalgia: Secondary | ICD-10-CM

## 2020-12-31 DIAGNOSIS — R1312 Dysphagia, oropharyngeal phase: Secondary | ICD-10-CM

## 2020-12-31 DIAGNOSIS — R042 Hemoptysis: Secondary | ICD-10-CM

## 2020-12-31 DIAGNOSIS — R030 Elevated blood-pressure reading, without diagnosis of hypertension: Secondary | ICD-10-CM

## 2020-12-31 NOTE — Telephone Encounter (Signed)
Pt is calling to request a letter for work that states she was seen today in the office. Pt states that if she does not have the note today. Her employer states that she will not be able to work the weekend. Pt states she will come back up to the office this afternoon to see if Dr. Wynetta Emery is able to write the letter for her today. Please advise

## 2020-12-31 NOTE — Progress Notes (Signed)
Patient ID: Katie Sherman, female    DOB: [birth year]  MRN: [id number]  CC: neck swelling  Subjective: Katie Sherman is a 55 y.o. female who presents for UC visit Her concerns today include:  Pt with hx of tob dep, lung nodules (stable on repeat CT 08/2020) GERD, HL, bipolar 1, chronic LT shoulder and Rt hip pain.  This was a telephone visit that was converted to an in person visit as patient was in our parking lot. Patient complains of swelling of the anterior and lateral neck x2 days.  2 nights ago she noted mild swelling of the submandibular area on the left side.  She woke up this morning and found that the swelling had progressed to the neck.  The swelling is painful.  She tried to swallow her pills this morning but felt they could not go down.  She slept sitting up last night because she was afraid that the swelling would progress and she would not be able to breathe.  She has not eaten much in the past 2 days out of fear that she would not be able to swallow.  No painful swallowing however with liquids. She denies any shortness of breath, sore throat, dental pain, fever or cough.  However she states that she spit up some blood twice last night when she was in the shower. Patient is a smoker.  She has history of pulmonary nodules being followed with CT.  Last CT scan was done in February of this year around the time that she tested positive for COVID.  Nodules were stable.  New findings were peripheral ground glass airspace opacities in the lower lobes concerning for atypical infectious process such as viral pneumonia.  Appointment was made for her to see Dr. Joya Gaskins.  Patient missed the appointment because she had to work.   Patient Active Problem List   Diagnosis Date Noted   Tobacco dependence 08/07/2020   Lung nodules 02/16/2020   Vitamin D deficiency 02/19/2019   Bilateral carpal tunnel syndrome 03/19/2018   Mixed hyperlipidemia 11/14/2017   Insomnia due to other mental disorder  05/23/2017   Postmenopausal 01/22/2017   Bipolar 1 disorder, depressed ([place]) 12/29/2016   Family history of suicide 11/20/2016   Depression 11/20/2016   Family history of breast cancer 11/20/2016   Adhesive capsulitis of left shoulder 11/19/2016   Greater trochanteric bursitis of right hip 11/19/2016     Current Outpatient Medications on File Prior to Visit  Medication Sig Dispense Refill   albuterol (VENTOLIN HFA) 108 (90 Base) MCG/ACT inhaler Inhale 2 puffs into the lungs every 6 (six) hours as needed for wheezing or shortness of breath. 8 g 2   cyclobenzaprine (FLEXERIL) 10 MG tablet TAKE 1 TABLET (10 MG TOTAL) BY MOUTH DAILY AS NEEDED FOR MUSCLE SPASMS. 30 tablet 2   gabapentin (NEURONTIN) 400 MG capsule TAKE 1 CAPSULE (400 MG TOTAL) BY MOUTH 2 (TWO) TIMES DAILY. 60 capsule 0   ibuprofen (ADVIL) 600 MG tablet Take 1 tablet (600 mg total) by mouth every 8 (eight) hours as needed. 30 tablet 0   lamoTRIgine (LAMICTAL) 100 MG tablet TAKE 3 TABLETS (300 MG TOTAL) BY MOUTH DAILY. 90 tablet 0   omeprazole (PRILOSEC) 20 MG capsule Take 1 capsule (20 mg total) by mouth daily. 90 capsule 3   ondansetron (ZOFRAN) 4 MG tablet TAKE 1 TABLET (4 MG TOTAL) BY MOUTH DAILY AS NEEDED FOR NAUSEA OR VOMITING. 20 tablet 0   pravastatin (PRAVACHOL) 20 MG tablet  Take 1 tablet (20 mg total) by mouth daily. 30 tablet 3   QUEtiapine (SEROQUEL) 200 MG tablet TAKE 1 TABLET (200 MG TOTAL) BY MOUTH AT BEDTIME. 30 tablet 0   traZODone (DESYREL) 50 MG tablet TAKE 1 TABLET (50 MG TOTAL) BY MOUTH AT BEDTIME AS NEEDED FOR SLEEP. 30 tablet 0   valACYclovir (VALTREX) 1000 MG tablet TAKE 1 TABLET (1,000 MG TOTAL) BY MOUTH 3 (THREE) TIMES DAILY FOR 7 DAYS. 21 tablet 0   vortioxetine HBr (TRINTELLIX) 10 MG TABS tablet Take 1 tablet (10 mg total) by mouth daily. 28 tablet 0   No current facility-administered medications on file prior to visit.    No Known Allergies  Social History   Socioeconomic History   Marital  status: Widowed    Spouse name: Not on file   Number of children: Not on file   Years of education: Not on file   Highest education level: Not on file  Occupational History   Occupation: unemployed  Tobacco Use   Smoking status: Every Day    Packs/day: 1.00    Years: 36.00    Pack years: 36.00    Types: Cigarettes    Start date: 59   Smokeless tobacco: Never   Tobacco comments:    3 months without  Vaping Use   Vaping Use: Never used  Substance and Sexual Activity   Alcohol use: No   Drug use: No    Types: Marijuana    Comment: stopped 04/06/2017   Sexual activity: Not Currently    Birth control/protection: None  Other Topics Concern   Not on file  Social History Narrative   Prior traffic control specialist - unemployed now   Widow   5 children, 10 grandchildren   Moved here from [place] 2013   Social Determinants of Health   Financial Resource Strain: Not on file  Food Insecurity: Not on file  Transportation Needs: Not on file  Physical Activity: Not on file  Stress: Not on file  Social Connections: Not on file  Intimate Partner Violence: Not on file    Family History  Problem Relation Age of Onset   Heart attack Mother 52   Heart disease Father    Heart attack Father 64    Past Surgical History:  Procedure Laterality Date   CERVICAL CONE BIOPSY     TUBAL LIGATION      ROS: Review of Systems Negative except as stated above  PHYSICAL EXAM: BP (!) 158/92   Pulse 89   LMP 04/29/2012   SpO2 97%   Physical Exam  General appearance - alert, well appearing, middle-age Caucasian female and in no distress Mental status -very anxious with pressured speech. Mouth -oral mucosa is moist.  She has several decayed tooth that are broken off in the gum in both the upper and lower jaw.  No signs of dental abscess.  Throat is clear without erythema or exudates. Neck -no swelling noted of the neck or submandibular area.  Thyroid is not enlarged and nontender to  touch.  No thyroid nodules palpated.  No cervical lymphadenopathy appreciated.  She has mild tenderness on palpation of the lateral aspect of the neck on both sides. Chest - clear to auscultation, no wheezes, rales or rhonchi, symmetric air entry Heart - normal rate, regular rhythm, normal S1, S2, no murmurs, rubs, clicks or gallops   CMP Latest Ref Rng & Units 12/07/2020 01/01/2020 04/13/2017  Glucose 65 - 99 mg/dL - 91 77  BUN 6 - 24 mg/dL - 10 11  Creatinine 0.57 - 1.00 mg/dL - 1.02(H) 1.04(H)  Sodium 134 - 144 mmol/L - 140 142  Potassium 3.5 - 5.2 mmol/L - 4.1 4.3  Chloride 96 - 106 mmol/L - 102 104  CO2 20 - 29 mmol/L - 24 23  Calcium 8.7 - 10.2 mg/dL - 9.3 9.2  Total Protein 6.0 - 8.5 g/dL 7.0 7.2 -  Total Bilirubin 0.0 - 1.2 mg/dL 0.2 0.4 -  Alkaline Phos 44 - 121 IU/L 122(H) 117 -  AST 0 - 40 IU/L 17 29 -  ALT 0 - 32 IU/L 12 18 -   Lipid Panel     Component Value Date/Time   CHOL 252 (H) 12/07/2020 1425   TRIG 212 (H) 12/07/2020 1425   HDL 62 12/07/2020 1425   CHOLHDL 4.1 12/07/2020 1425   CHOLHDL 5.2 12/30/2016 0622   VLDL 34 12/30/2016 0622   LDLCALC 152 (H) 12/07/2020 1425    CBC    Component Value Date/Time   WBC 8.8 01/01/2020 1446   WBC 5.6 03/27/2017 0618   RBC 4.51 01/01/2020 1446   RBC 4.16 03/27/2017 0618   HGB 14.4 01/01/2020 1446   HCT 42.1 01/01/2020 1446   PLT 355 01/01/2020 1446   MCV 93 01/01/2020 1446   MCH 31.9 01/01/2020 1446   MCH 31.3 03/27/2017 0618   MCHC 34.2 01/01/2020 1446   MCHC 33.5 03/27/2017 0618   RDW 12.9 01/01/2020 1446   LYMPHSABS 2.1 03/08/2017 0719   MONOABS 1.0 03/08/2017 0719   EOSABS 0.1 03/08/2017 0719   BASOSABS 0.1 03/08/2017 0719    ASSESSMENT AND PLAN: 1. Neck pain I did not appreciate any swelling of the neck.  She does not appear to have any active infection in the oral cavity or in the throat that I can see.  Doubt thyroiditis but we will check thyroid function tests.  Patient very anxious with pressured  speech.  Advised her to try to relax.  If she feels that things progress over the weekend she should be seen in the emergency room. - [id number]; Future  2. Oropharyngeal dysphagia Seems to be more fair of dysphagia rather than actual dysphagia.  Nonetheless we will do a barium swallow. - DG ESOPHAGUS W SINGLE CM (SOL OR THIN BA); Future  3. Hemoptysis Advised to quit smoking. We will have her schedule to see Dr. Joya Gaskins.  Cannot refer to pulmonary as patient has only family-planning Medicaid.  4. Elevated blood-pressure reading without diagnosis of hypertension Patient very anxious today.  We will plan to recheck blood pressure on next visit.  DASH diet encouraged.    Patient was given the opportunity to ask questions.  Patient verbalized understanding of the plan and was able to repeat key elements of the plan.   Orders Placed This Encounter  Procedures   DG ESOPHAGUS W SINGLE CM (SOL OR THIN BA)   TSH+T4F+T3Free      Requested Prescriptions    No prescriptions requested or ordered in this encounter    No follow-ups on file.  Karle Plumber, MD, FACP

## 2021-01-01 ENCOUNTER — Encounter (HOSPITAL_COMMUNITY): Payer: Self-pay

## 2021-01-01 ENCOUNTER — Other Ambulatory Visit: Payer: Self-pay

## 2021-01-01 ENCOUNTER — Emergency Department (HOSPITAL_COMMUNITY)
Admission: EM | Admit: 2021-01-01 | Discharge: 2021-01-01 | Disposition: A | Discharge status: Home / Self Care | Payer: Medicaid Other | Attending: Emergency Medicine | Admitting: Emergency Medicine

## 2021-01-01 DIAGNOSIS — M542 Cervicalgia: Secondary | ICD-10-CM | POA: Insufficient documentation

## 2021-01-01 DIAGNOSIS — Z79899 Other long term (current) drug therapy: Secondary | ICD-10-CM | POA: Insufficient documentation

## 2021-01-01 DIAGNOSIS — F17210 Nicotine dependence, cigarettes, uncomplicated: Secondary | ICD-10-CM | POA: Insufficient documentation

## 2021-01-01 DIAGNOSIS — K029 Dental caries, unspecified: Secondary | ICD-10-CM | POA: Insufficient documentation

## 2021-01-01 DIAGNOSIS — M62838 Other muscle spasm: Secondary | ICD-10-CM

## 2021-01-01 DIAGNOSIS — Z85118 Personal history of other malignant neoplasm of bronchus and lung: Secondary | ICD-10-CM | POA: Insufficient documentation

## 2021-01-01 DIAGNOSIS — R1110 Vomiting, unspecified: Secondary | ICD-10-CM | POA: Insufficient documentation

## 2021-01-01 MED ORDER — METHOCARBAMOL 500 MG PO TABS: 500.0000 mg | ORAL_TABLET | Freq: Three times a day (TID) | ORAL | 0 refills | Status: DC | PRN

## 2021-01-01 MED ORDER — KETOROLAC TROMETHAMINE 30 MG/ML IJ SOLN
30.0000 mg | Freq: Once | INTRAMUSCULAR | Status: AC
  Administered 2021-01-01: 30 mg via INTRAMUSCULAR
  Filled 2021-01-01: qty 1

## 2021-01-01 MED ORDER — METHOCARBAMOL 500 MG PO TABS
1000.0000 mg | ORAL_TABLET | Freq: Once | ORAL | Status: AC
  Administered 2021-01-01: 1000 mg via ORAL
  Filled 2021-01-01: qty 2

## 2021-01-01 NOTE — Discharge Instructions (Addendum)
Came to the emerge department today to be evaluated for your neck swelling and neck pain.  Your physical exam was very reassuring.  There was no signs of and swelling or masses to your neck.  You were able to swallow without difficulty.  I suspect that your pain is musculoskeletal in nature and should improve over time.  I have given you a prescription for Robaxin.  Please take this medication once every 8 hours as needed.  Do not combine this medication with Flexeril.  Today you were prescribed Methocarbamol (Robaxin).  Methocarbamol (Robaxin) is used to treat muscle spasms/pain.  It works by helping to relax the muscles.  Drowsiness, dizziness, lightheadedness, stomach upset, nausea/vomiting, or blurred vision may occur.  Do not drive, use machinery, or do anything that needs alertness or clear vision until you can do it safely.  Do not combine this medication with alcoholic beverages, marijuana, or other central nervous system depressants.    Get help right away if: You cannot swallow your saliva. You have shortness of breath, a fever, or both. Your voice is hoarse and you have trouble swallowing.

## 2021-01-01 NOTE — ED Triage Notes (Signed)
Pt BIB EMS from work; On Tuesday pt started getting pain on left side of neck, then it radiated to both sides. Pain became worse today while at work and called EMS.  Pt is anxious, denies drug use.   150/100 BP  110 Pulse  97% Room Air  108 CBG

## 2021-01-01 NOTE — ED Provider Notes (Signed)
[name] Note   CSN: [id number] Arrival date & time: 01/01/21  2057     History Chief Complaint  Patient presents with   Neck Pain    Katie Sherman is a 55 y.o. female with past medical history of tobacco use, cervical cancer.  Patient presents to the emergency department with a chief complaint of swelling and tenderness to her neck.  Patient reports that her symptoms started on Wednesday and were located on the left side of her neck.  Patient also endorsed that she was having some difficulty and pain when swallowing liquids but "only on the left side of my throat."  She reports that Thursday morning swelling and pain wear to both sides of her throat.  Reports progressive worsening of symptoms on Friday.  Patient reports that she tried to take your prescribed Acacian Friday morning but it "felt like my throat was closing up.".  States that it felt like one of her pills had become stuck in her throat.  Patient reports that she "stuck a wooden spoon down my throat," in an attempt to push the pill further along.  Patient reports that she had an episode of vomiting after this.    At present patient reports that pain is only located to the right side of her neck.  Patient rates her pain 10/10 on the pain scale.  Patient reports that pain is worse with movement and touch.  Patient reports that she has had some relief with ibuprofen earlier in the week but has not taken any today.  Patient denies any fever, chills, drooling, potato voice, chest pain, shortness of breath, lightheadedness, dizziness, or headache.   Neck Pain Associated symptoms: no chest pain, no fever and no headaches       Past Medical History:  Diagnosis Date   Bilateral carpal tunnel syndrome 03/19/2018   Cervical cancer ([place])    Cervical cancer ([place])    History of exercise stress test    ETT-Echo 5/18: normal EF, no ischemia   History of loop recorder    currently on  person   Kidney stone     Patient Active Problem List   Diagnosis Date Noted   Tobacco dependence 08/07/2020   Lung nodules 02/16/2020   Vitamin D deficiency 02/19/2019   Bilateral carpal tunnel syndrome 03/19/2018   Mixed hyperlipidemia 11/14/2017   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   Postmenopausal 01/22/2017   Bipolar 1 disorder, depressed ([place]) 12/29/2016   Family history of suicide 11/20/2016   Depression 11/20/2016   Family history of breast cancer 11/20/2016   Adhesive capsulitis of left shoulder 11/19/2016   Greater trochanteric bursitis of right hip 11/19/2016    Past Surgical History:  Procedure Laterality Date   CERVICAL CONE BIOPSY     TUBAL LIGATION       OB History     Gravida  6   Para  5   Term  5   Preterm      AB  1   Living  5      SAB      IAB  1   Ectopic      Multiple      Live Births  5           Family History  Problem Relation Age of Onset   Heart attack Mother 27   Heart disease Father    Heart attack Father 73    Social History  Tobacco Use   Smoking status: Every Day    Packs/day: 1.00    Years: 36.00    Pack years: 36.00    Types: Cigarettes    Start date: 33   Smokeless tobacco: Never   Tobacco comments:    3 months without  Vaping Use   Vaping Use: Never used  Substance Use Topics   Alcohol use: No   Drug use: No    Types: Marijuana    Comment: stopped 04/06/2017    Home Medications Prior to Admission medications   Medication Sig Start Date End Date Taking? Authorizing Provider  albuterol (VENTOLIN HFA) 108 (90 Base) MCG/ACT inhaler Inhale 2 puffs into the lungs every 6 (six) hours as needed for wheezing or shortness of breath. 08/07/20   Ladell Pier, MD  cyclobenzaprine (FLEXERIL) 10 MG tablet TAKE 1 TABLET (10 MG TOTAL) BY MOUTH DAILY AS NEEDED FOR MUSCLE SPASMS. 12/08/20 12/08/21  Ladell Pier, MD  gabapentin (NEURONTIN) 400 MG capsule TAKE 1 CAPSULE (400 MG TOTAL) BY  MOUTH 2 (TWO) TIMES DAILY. 12/30/20 12/30/21  Arfeen, Arlyce Harman, MD  ibuprofen (ADVIL) 600 MG tablet Take 1 tablet (600 mg total) by mouth every 8 (eight) hours as needed. 05/05/20   Ladell Pier, MD  lamoTRIgine (LAMICTAL) 100 MG tablet TAKE 3 TABLETS (300 MG TOTAL) BY MOUTH DAILY. 12/30/20 12/30/21  Arfeen, Arlyce Harman, MD  omeprazole (PRILOSEC) 20 MG capsule Take 1 capsule (20 mg total) by mouth daily. 05/05/20   Ladell Pier, MD  ondansetron (ZOFRAN) 4 MG tablet TAKE 1 TABLET (4 MG TOTAL) BY MOUTH DAILY AS NEEDED FOR NAUSEA OR VOMITING. 08/01/20 08/01/21  Ladell Pier, MD  pravastatin (PRAVACHOL) 20 MG tablet Take 1 tablet (20 mg total) by mouth daily. 11/12/20   Ladell Pier, MD  QUEtiapine (SEROQUEL) 200 MG tablet TAKE 1 TABLET (200 MG TOTAL) BY MOUTH AT BEDTIME. 12/30/20 12/30/21  Arfeen, Arlyce Harman, MD  traZODone (DESYREL) 50 MG tablet TAKE 1 TABLET (50 MG TOTAL) BY MOUTH AT BEDTIME AS NEEDED FOR SLEEP. 12/30/20 12/30/21  Arfeen, Arlyce Harman, MD  valACYclovir (VALTREX) 1000 MG tablet TAKE 1 TABLET (1,000 MG TOTAL) BY MOUTH 3 (THREE) TIMES DAILY FOR 7 DAYS. 07/18/20 07/18/21  Kara Dies, NP  vortioxetine HBr (TRINTELLIX) 10 MG TABS tablet Take 1 tablet (10 mg total) by mouth daily. 12/30/20   Arfeen, Arlyce Harman, MD    Allergies    Patient has no known allergies.  Review of Systems   Review of Systems  Constitutional:  Negative for chills and fever.  HENT:  Positive for sore throat and trouble swallowing. Negative for congestion, drooling, facial swelling, rhinorrhea and voice change.   Eyes:  Negative for visual disturbance.  Respiratory:  Negative for shortness of breath.   Cardiovascular:  Negative for chest pain.  Gastrointestinal:  Negative for abdominal pain, nausea and vomiting.  Musculoskeletal:  Positive for neck pain and neck stiffness. Negative for back pain.  Skin:  Negative for color change, pallor, rash and wound.  Allergic/Immunologic: Negative for immunocompromised state.   Neurological:  Negative for dizziness, syncope, light-headedness and headaches.  Psychiatric/Behavioral:  Negative for confusion.    Physical Exam Updated Vital Signs BP (!) 140/91 (BP Location: Right Arm)   Pulse 93   Temp 98 F (36.7 C) (Oral)   Resp 18   LMP 04/29/2012   SpO2 100%   Physical Exam Vitals and nursing note reviewed.  Constitutional:  General: She is not in acute distress.    Appearance: She is not ill-appearing, toxic-appearing or diaphoretic.  HENT:     Head: Normocephalic. No raccoon eyes, Battle's sign, abrasion, contusion, masses, right periorbital erythema, left periorbital erythema or laceration.     Jaw: No trismus, tenderness or pain on movement.     Mouth/Throat:     Mouth: No injury, lacerations or angioedema.     Pharynx: Oropharynx is clear. Uvula midline. No pharyngeal swelling, oropharyngeal exudate, posterior oropharyngeal erythema or uvula swelling.     Tonsils: No tonsillar exudate or tonsillar abscesses.     Comments: Poor dentition with multiple missing teeth and dental caries.  No swelling to floor of patient's mouth.   Eyes:     General: No scleral icterus.       Right eye: No discharge.        Left eye: No discharge.  Neck:     Thyroid: No thyroid mass, thyromegaly or thyroid tenderness.     Vascular: No carotid bruit.     Comments: Tenderness to light touch along right sternocleidomastoid, complains of pain with range of motion.  Decreased range of motion secondary to complaints of pain.  Patient is able to turn neck during interview when unprompted by provider. Cardiovascular:     Rate and Rhythm: Normal rate.     Pulses:          Carotid pulses are 2+ on the right side and 2+ on the left side. Pulmonary:     Effort: Pulmonary effort is normal.  Musculoskeletal:     Cervical back: Neck supple. Tenderness present. No swelling, edema, deformity, erythema, signs of trauma, lacerations, rigidity, spasms, torticollis, bony tenderness  or crepitus. Pain with movement and muscular tenderness present. No spinous process tenderness. Normal range of motion.  Lymphadenopathy:     Cervical: No cervical adenopathy.  Skin:    General: Skin is warm and dry.     Coloration: Skin is not jaundiced or pale.     Findings: No rash.  Neurological:     General: No focal deficit present.     Mental Status: She is alert.  Psychiatric:        Mood and Affect: Mood is anxious.        Speech: Speech is rapid and pressured.        Behavior: Behavior is cooperative.    ED Results / Procedures / Treatments   Labs (all labs ordered are listed, but only abnormal results are displayed) Labs Reviewed - No data to display  EKG None  Radiology No results found.  Procedures Procedures   Medications Ordered in ED Medications - No data to display  ED Course  I have reviewed the triage vital signs and the nursing notes.  Pertinent labs & imaging results that were available during my care of the patient were reviewed by me and considered in my medical decision making (see chart for details).    MDM Rules/Calculators/A&P                          Alert 55 year old female no acute distress, nontoxic-appearing.  Patient presents emerged part with a chief complaint of neck pain and swelling.  Patient reports that symptoms started on Wednesday and have progressively worsened since then.  Symptoms were originally located on the left side of her neck, then bilaterally, and now just to the right side of her neck.  She endorses sore  throat and difficulty swallowing.  On physical exam patient is neck is supple, no midline tenderness or deformity to cervical neck.  Patient has tenderness to light touch of right sternocleidomastoid muscle.  No carotid bruits.  +2 carotid pulse bilaterally.  Patient is able to move neck when unprompted during interview.  Has no signs or symptoms of peritonsillar abscess, Ludwig's angina. Patient is able to drink liquids  in the room without difficulty.  She is afebrile, vital signs stable.  Low suspicion for meningeal infection at this time.  Patient's vital signs and physical exam. Low suspicion for septic arthritis.  Suspect musculoskeletal injury as cause of patient's symptoms.  We will give patient Toradol injection and Robaxin.  On reassessment patient reports some improvement in her symptoms.  Will prescribe patient with Robaxin and advised over-the-counter pain medication.  Patient has follow-up planned with her primary care provider.  Patient has barium swallow study scheduled with her primary care provider.  Will discharge patient at this time.  Patient given strict return precautions.  Patient expressed understanding of all instructions and was agreeable with this plan.  Final Clinical Impression(s) / ED Diagnoses Final diagnoses:  Neck pain  Muscle spasms of neck    Rx / DC Orders ED Discharge Orders     None        Loni Beckwith, PA-C 01/02/21 0143    Milton Ferguson, MD 01/03/21 [phone]

## 2021-01-02 ENCOUNTER — Encounter (HOSPITAL_COMMUNITY): Payer: Self-pay | Admitting: Emergency Medicine

## 2021-01-02 ENCOUNTER — Emergency Department (HOSPITAL_COMMUNITY)
Admission: EM | Admit: 2021-01-02 | Discharge: 2021-01-02 | Discharge status: Against Medical Advice | Payer: Medicaid Other

## 2021-01-02 ENCOUNTER — Other Ambulatory Visit: Payer: Self-pay

## 2021-01-02 NOTE — ED Triage Notes (Addendum)
Pt to triage via [facility] from home. Reports redness, pain, and swelling to R side of neck x 4 days with difficulty swallowing.  States it initially started on left neck but now R side is worse.  Se[facility] at [facility] same [facility].  Pt asked NT for water and was informed that we need to get vitals and she will go back to lobby to wait to see Dr before we can give her water.  Pt questioned why she was going to lobby and got up and said she was calling her daughter and walked out without vitals.  Security looked for pt but was unable to locate her.

## 2021-01-03 ENCOUNTER — Ambulatory Visit: Payer: Self-pay | Admitting: *Deleted

## 2021-01-03 ENCOUNTER — Telehealth: Payer: Self-pay | Admitting: Internal Medicine

## 2021-01-03 NOTE — Telephone Encounter (Signed)
Per Dr. Wynetta Emery please schedule patient with Dr. Joya Gaskins for eval of hemoptysis. Called patient no answer. Left vm to call [phone] to get scheduled.

## 2021-01-03 NOTE — Telephone Encounter (Signed)
Reason for Disposition  [1] Chest pain lasts > 5 minutes AND [2] occurred in past 3 days (72 hours) (Exception: feels exactly the same as previously diagnosed heartburn and has accompanying sour taste in mouth)  Answer Assessment - Initial Assessment Questions 1. LOCATION: "Where does it hurt?"       Chest pain with breathing- swelling in chest and back of neck- red 2. RADIATION: "Does the pain go anywhere else?" (e.g., into neck, jaw, arms, back)     Pain travels to middle of back 3. ONSET: "When did the chest pain begin?" (Minutes, hours or days)      6/24 4. PATTERN "Does the pain come and go, or has it been constant since it started?"  "Does it get worse with exertion?"      Worse and worse over time 5. DURATION: "How long does it last" (e.g., seconds, minutes, hours)     constant 6. SEVERITY: "How bad is the pain?"  (e.g., Scale 1-10; mild, moderate, or severe)    - MILD (1-3): doesn't interfere with normal activities     - MODERATE (4-7): interferes with normal activities or awakens from sleep    - SEVERE (8-10): excruciating pain, unable to do any normal activities       severe 7. CARDIAC RISK FACTORS: "Do you have any history of heart problems or risk factors for heart disease?" (e.g., angina, prior heart attack; diabetes, high blood pressure, high cholesterol, smoker, or strong family history of heart disease)     no 8. PULMONARY RISK FACTORS: "Do you have any history of lung disease?"  (e.g., blood clots in lung, asthma, emphysema, birth control pills)     no 9. CAUSE: "What do you think is causing the chest pain?"     Unsure- swelling in throat, chest pain and neck pain 10. OTHER SYMPTOMS: "Do you have any other symptoms?" (e.g., dizziness, nausea, vomiting, sweating, fever, difficulty breathing, cough)       Mouth dry-drinking water and sprite 11. PREGNANCY: "Is there any chance you are pregnant?" "When was your last menstrual period?"       N/a  Protocols used: Chest  Pain-A-AH

## 2021-01-03 NOTE — Telephone Encounter (Signed)
Patient is calling to report that she is not better- he pain is now in the neck and chest. Patient states she has been unable to breath without pain and she is unable to eat. Advised patient she should return to ED and not leave this time. She states she has to call EMS because she does not have transportation. Advised her to please do that.

## 2021-01-04 ENCOUNTER — Other Ambulatory Visit: Payer: Self-pay

## 2021-01-04 ENCOUNTER — Encounter: Payer: Self-pay | Admitting: Internal Medicine

## 2021-01-04 ENCOUNTER — Encounter: Payer: Self-pay | Admitting: Family Medicine

## 2021-01-04 ENCOUNTER — Ambulatory Visit: Payer: Medicaid Other | Attending: Family Medicine | Admitting: Family Medicine

## 2021-01-04 ENCOUNTER — Other Ambulatory Visit: Payer: Self-pay | Admitting: *Deleted

## 2021-01-04 VITALS — BP 129/84 | HR 91 | Wt 169.0 lb

## 2021-01-04 DIAGNOSIS — Z113 Encounter for screening for infections with a predominantly sexual mode of transmission: Secondary | ICD-10-CM

## 2021-01-04 DIAGNOSIS — M25512 Pain in left shoulder: Secondary | ICD-10-CM

## 2021-01-04 DIAGNOSIS — G8929 Other chronic pain: Secondary | ICD-10-CM

## 2021-01-04 DIAGNOSIS — M542 Cervicalgia: Secondary | ICD-10-CM

## 2021-01-04 MED ORDER — LIDOCAINE 5 % EX PTCH
1.0000 | MEDICATED_PATCH | CUTANEOUS | 0 refills | Status: DC
  Filled 2021-01-04: qty 30, 30d supply, fill #0

## 2021-01-04 MED ORDER — MELOXICAM 7.5 MG PO TABS
7.5000 mg | ORAL_TABLET | Freq: Every day | ORAL | 0 refills | Status: DC
  Filled 2021-01-04: qty 30, 30d supply, fill #0

## 2021-01-04 NOTE — Progress Notes (Signed)
Subjective:  Patient ID: Katie Sherman, female    DOB: [birth year]  Age: 55 y.o. MRN: [id number]  CC: Neck Pain   HPI Katie Sherman is a 55 year old female patient of Dr. Wynetta Emery with a medical history of bipolar disorder, tobacco abuse, previous history of cervical cancer who is seen for an acute visit. Seen by PCP on 12/31/2020 for neck swelling, neck pain, dysphagia and referred for barium swallow.  Seen at the ED the next day 01/01/2021 for neck pain and received Toradol shot and Robaxin.  She called a day after her ED visit for an appointment due to worsening of symptoms.  Interval History: The right side of her neck hurt at work and she went to the ED after her PCP visit she states.  She complains the PA ran no tests and inserted in his notes that everything was normal. 'He dug his finger in the right side of her neck' which resulted in additional pain. Pain radiates from the R neck to her chest and L shoulder and back. Today it is not so bad just the left shoulder hurts which she describes as acute. Review of her chart reveals a previous history of adhesive capsulitis.  She would like to be checked for herpes as she had last intercourse a month ago with a guy who had sent her his test results but did not reveal the HIV or herpes status.  She thinks her neck and shoulder symptoms could be related to herpes and is also wanting to be screened as she developed a rash on her left thigh which did not respond to an OTC cream. Past Medical History:  Diagnosis Date   Bilateral carpal tunnel syndrome 03/19/2018   Cervical cancer ([place])    Cervical cancer ([place])    History of exercise stress test    ETT-Echo 5/18: normal EF, no ischemia   History of loop recorder    currently on person   Kidney stone     Past Surgical History:  Procedure Laterality Date   CERVICAL CONE BIOPSY     TUBAL LIGATION      Family History  Problem Relation Age of Onset   Heart attack Mother 50   Heart  disease Father    Heart attack Father 64    No Known Allergies  Outpatient Medications Prior to Visit  Medication Sig Dispense Refill   albuterol (VENTOLIN HFA) 108 (90 Base) MCG/ACT inhaler Inhale 2 puffs into the lungs every 6 (six) hours as needed for wheezing or shortness of breath. 8 g 2   cyclobenzaprine (FLEXERIL) 10 MG tablet TAKE 1 TABLET (10 MG TOTAL) BY MOUTH DAILY AS NEEDED FOR MUSCLE SPASMS. 30 tablet 2   gabapentin (NEURONTIN) 400 MG capsule TAKE 1 CAPSULE (400 MG TOTAL) BY MOUTH 2 (TWO) TIMES DAILY. 60 capsule 0   ibuprofen (ADVIL) 600 MG tablet Take 1 tablet (600 mg total) by mouth every 8 (eight) hours as needed. 30 tablet 0   lamoTRIgine (LAMICTAL) 100 MG tablet TAKE 3 TABLETS (300 MG TOTAL) BY MOUTH DAILY. 90 tablet 0   methocarbamol (ROBAXIN) 500 MG tablet Take 1 tablet (500 mg total) by mouth every 8 (eight) hours as needed for muscle spasms. 20 tablet 0   omeprazole (PRILOSEC) 20 MG capsule Take 1 capsule (20 mg total) by mouth daily. 90 capsule 3   ondansetron (ZOFRAN) 4 MG tablet TAKE 1 TABLET (4 MG TOTAL) BY MOUTH DAILY AS NEEDED FOR NAUSEA OR VOMITING. 20 tablet 0  pravastatin (PRAVACHOL) 20 MG tablet Take 1 tablet (20 mg total) by mouth daily. 30 tablet 3   QUEtiapine (SEROQUEL) 200 MG tablet TAKE 1 TABLET (200 MG TOTAL) BY MOUTH AT BEDTIME. 30 tablet 0   traZODone (DESYREL) 50 MG tablet TAKE 1 TABLET (50 MG TOTAL) BY MOUTH AT BEDTIME AS NEEDED FOR SLEEP. 30 tablet 0   valACYclovir (VALTREX) 1000 MG tablet TAKE 1 TABLET (1,000 MG TOTAL) BY MOUTH 3 (THREE) TIMES DAILY FOR 7 DAYS. 21 tablet 0   vortioxetine HBr (TRINTELLIX) 10 MG TABS tablet Take 1 tablet (10 mg total) by mouth daily. 28 tablet 0   No facility-administered medications prior to visit.     ROS Review of Systems  Constitutional:  Negative for activity change, appetite change and fatigue.  HENT:  Negative for congestion, sinus pressure and sore throat.   Eyes:  Negative for visual disturbance.   Respiratory:  Negative for cough, chest tightness, shortness of breath and wheezing.   Cardiovascular:  Negative for chest pain and palpitations.  Gastrointestinal:  Negative for abdominal distention, abdominal pain and constipation.  Endocrine: Negative for polydipsia.  Genitourinary:  Negative for dysuria and frequency.  Musculoskeletal:  Positive for neck pain. Negative for arthralgias and back pain.  Skin:  Positive for rash.  Neurological:  Negative for tremors, light-headedness and numbness.  Hematological:  Does not bruise/bleed easily.  Psychiatric/Behavioral:  Negative for agitation and behavioral problems.    Objective:  BP 129/84   Pulse 91   Wt 169 lb (76.7 kg)   LMP 04/29/2012   SpO2 94%   BMI 26.87 kg/m   BP/Weight 01/04/2021 01/01/2021 6/78/9381  Systolic BP 017 510 258  Diastolic BP 84 85 92  Wt. (Lbs) 169 - -  BMI 26.87 - -  Some encounter information is confidential and restricted. Go to Review Flowsheets activity to see all data.      Physical Exam Constitutional:      Appearance: She is well-developed.  Neck:     Vascular: No carotid bruit or JVD.     Comments: Slight erythema base of neck Reduced lateral range of motion in flexion and extension No demonstrable edema Cardiovascular:     Rate and Rhythm: Normal rate.     Heart sounds: Normal heart sounds. No murmur heard. Pulmonary:     Effort: Pulmonary effort is normal.     Breath sounds: Normal breath sounds. No wheezing or rales.  Chest:     Chest wall: No tenderness.  Abdominal:     General: Bowel sounds are normal. There is no distension.     Palpations: Abdomen is soft. There is no mass.     Tenderness: There is no abdominal tenderness.  Musculoskeletal:        General: Normal range of motion.     Cervical back: Tenderness (in R sternocleidomastoid and posterior neck) present.     Right lower leg: No edema.     Left lower leg: No edema.     Comments: Abduction of left shoulder  restricted to 30 degrees on active range of motion and passive range of motion limited to 90 degrees.  Neurological:     Mental Status: She is alert and oriented to person, place, and time.  Psychiatric:        Mood and Affect: Mood normal.    CMP Latest Ref Rng & Units 12/07/2020 01/01/2020 04/13/2017  Glucose 65 - 99 mg/dL - 91 77  BUN 6 - 24 mg/dL - 10  11  Creatinine 0.57 - 1.00 mg/dL - 1.02(H) 1.04(H)  Sodium 134 - 144 mmol/L - 140 142  Potassium 3.5 - 5.2 mmol/L - 4.1 4.3  Chloride 96 - 106 mmol/L - 102 104  CO2 20 - 29 mmol/L - 24 23  Calcium 8.7 - 10.2 mg/dL - 9.3 9.2  Total Protein 6.0 - 8.5 g/dL 7.0 7.2 -  Total Bilirubin 0.0 - 1.2 mg/dL 0.2 0.4 -  Alkaline Phos 44 - 121 IU/L 122(H) 117 -  AST 0 - 40 IU/L 17 29 -  ALT 0 - 32 IU/L 12 18 -    Lipid Panel     Component Value Date/Time   CHOL 252 (H) 12/07/2020 1425   TRIG 212 (H) 12/07/2020 1425   HDL 62 12/07/2020 1425   CHOLHDL 4.1 12/07/2020 1425   CHOLHDL 5.2 12/30/2016 0622   VLDL 34 12/30/2016 0622   LDLCALC 152 (H) 12/07/2020 1425    CBC    Component Value Date/Time   WBC 8.8 01/01/2020 1446   WBC 5.6 03/27/2017 0618   RBC 4.51 01/01/2020 1446   RBC 4.16 03/27/2017 0618   HGB 14.4 01/01/2020 1446   HCT 42.1 01/01/2020 1446   PLT 355 01/01/2020 1446   MCV 93 01/01/2020 1446   MCH 31.9 01/01/2020 1446   MCH 31.3 03/27/2017 0618   MCHC 34.2 01/01/2020 1446   MCHC 33.5 03/27/2017 0618   RDW 12.9 01/01/2020 1446   LYMPHSABS 2.1 03/08/2017 0719   MONOABS 1.0 03/08/2017 0719   EOSABS 0.1 03/08/2017 0719   BASOSABS 0.1 03/08/2017 0719    Lab Results  Component Value Date   HGBA1C 5.3 01/01/2020    Assessment & Plan:  1. Cervicalgia Uncontrolled Likely musculoskeletal Advised to apply heat - lidocaine (LIDODERM) 5 %; Place 1 patch onto the skin daily. Remove & Discard patch within 12 hours or as directed by MD  Dispense: 30 patch; Refill: 0 - meloxicam (MOBIC) 7.5 MG tablet; Take 1 tablet (7.5 mg  total) by mouth daily.  Dispense: 30 tablet; Refill: 0  2. Chronic left shoulder pain Review of chart indicates this is chronic suspicious for adhesive capsulitis Will benefit from PT if medication regimen is ineffective - lidocaine (LIDODERM) 5 %; Place 1 patch onto the skin daily. Remove & Discard patch within 12 hours or as directed by MD  Dispense: 30 patch; Refill: 0 - meloxicam (MOBIC) 7.5 MG tablet; Take 1 tablet (7.5 mg total) by mouth daily.  Dispense: 30 tablet; Refill: 0  3. Screening for STD (sexually transmitted disease) We will screen for herpes per patient request - HSV I/II IgM Rflx I/II Type Sp   There are no diagnoses linked to this encounter  Meds ordered this encounter  Medications   lidocaine (LIDODERM) 5 %    Sig: Place 1 patch onto the skin daily. Remove & Discard patch within 12 hours or as directed by MD    Dispense:  30 patch    Refill:  0   meloxicam (MOBIC) 7.5 MG tablet    Sig: Take 1 tablet (7.5 mg total) by mouth daily.    Dispense:  30 tablet    Refill:  0    Follow-up: Return for medical conditions, keep previously scheduled appointment with PCP.       Charlott Rakes, MD, FAAFP. [facility] [place], [place]   01/04/2021, 5:15 PM

## 2021-01-04 NOTE — Patient Instructions (Signed)
Cervical Sprain A cervical sprain is a stretch or tear in one or more of the ligaments in the neck. Ligaments are the tissues that connect bones. Cervical sprains can range from mild to severe. Severe cervical sprains can cause the spinal bones (vertebrae) in the neck to be unstable. This can result in spinal cord damage and in serious nervous system problems. The time that it takes for a cervical sprain to heal depends on the cause and extent of the injury. Most cervical sprains heal in 4-6 weeks. What are the causes? Cervical sprains may be caused by trauma, such as an injury from a motor vehicle accident, a fall, or a sudden forward and backward whipping movement of the head and neck (whiplash injury). Mild cervical sprains may be caused by wear and tear over time. What increases the risk? The following factors may make you more likely to develop this condition: Participating in activities that have a high risk of trauma to the neck. These include contact sports, auto racing, gymnastics, and diving. Taking risks when driving or riding in a motor vehicle. Osteoarthritis of the spine. Poor strength and flexibility of the neck. A previous neck injury. Poor posture. Spending long periods in certain positions that put stress on the neck, such as sitting at a computer for a long time. What are the signs or symptoms? Symptoms of this condition include: Pain, soreness, stiffness, tenderness, swelling, or a burning sensation in the front, back, or sides of the neck, shoulders, or upper back. Sudden tightening of neck muscles (spasms). Limited ability to move the neck. Headache. Dizziness. Nausea or vomiting. Weakness, numbness, or tingling in a hand or an arm. Symptoms may develop right away after injury, or they may develop over a few days. In some cases, symptoms may go away with treatment and return (recur) over time. How is this diagnosed? This condition may be diagnosed based on: Your  medical history. Your symptoms. Any recent injuries or known neck problems that you have, such as arthritis in the neck. A physical exam. Imaging tests, such as X-rays, MRI, and CT scan. How is this treated? This condition is treated by resting and icing the injured area and doing physical therapy exercises. Heat therapy may be used 2-3 days after the injury occurred if there is no swelling. Depending on the severity of your condition, treatment may also include: Keeping your neck in place (immobilized) for periods of time. This may be done using: A cervical collar. This supports your chin and the back of your head. A cervical traction device. This is a sling that holds up your head. The device removes weight and pressure from your neck, and it may help to relieve pain. Medicines that help to relieve pain and inflammation. Medicines that help to relax your muscles (muscle relaxants). Surgery. This is rare. Follow these instructions at home: Medicines  Take over-the-counter and prescription medicines only as told by your health care provider. Ask your health care provider if the medicine prescribed to you: Requires you to avoid driving or using heavy machinery. Can cause constipation. You may need to take these actions to prevent or treat constipation: Drink enough fluid to keep your urine pale yellow. Take over-the-counter or prescription medicines. Eat foods that are high in fiber, such as beans, whole grains, and fresh fruits and vegetables. Limit foods that are high in fat and processed sugars, such as fried or sweet foods. If you have a cervical collar: Wear the collar as told by your   health care provider. Do not remove it unless told. Ask before making any adjustments to your collar. If you have long hair, keep it outside of the collar. Ask your health care provider if you may remove the collar for cleaning and bathing. If so: Follow instructions about how to remove it  safely. Clean it by hand with mild soap and water and air-dry it completely. If your collar has removable pads, remove them every 1-2 days and wash them by hand with soap and water. Let them air-dry completely before putting them back in the collar. Tell your health care provider if your skin under the collar has irritation or sores. Managing pain, stiffness, and swelling   If directed, use a cervical traction device as told. If directed, put ice on the affected area. To do this: Put ice in a plastic bag. Place a towel between your skin and the bag. Leave the ice on for 20 minutes, 2-3 times a day. If directed, apply heat to the affected area before you do your physical therapy or as often as told by your health care provider. Use the heat source that your health care provider recommends, such as a moist heat pack or a heating pad. Place a towel between your skin and the heat source. Leave the heat on for 20-30 minutes. Remove the heat if your skin turns bright red. This is especially important if you are unable to feel pain, heat, or cold. You may have a greater risk of getting burned. Activity Do not drive while wearing a cervical collar. If you do not have a cervical collar, ask if it is safe to drive while your neck heals. Do not lift anything that is heavier than 10 lb (4.5 kg), or the limit that you are told, until your health care provider says that it is safe. Rest as told by your health care provider. If physical therapy was prescribed, do exercises as told by your health care provider or physical therapist. Return to your normal activities as told by your health care provider. Avoid positions and activities that make your symptoms worse. Ask your health care provider what activities are safe for you. General instructions Do not use any products that contain nicotine or tobacco, such as cigarettes, e-cigarettes, and chewing tobacco. These can delay healing. If you need help quitting,  ask your health care provider. Keep all follow-up visits as told by your health care provider or physical therapist. This is important. How is this prevented? To prevent a cervical sprain from happening again: Use and maintain good posture. Make any needed adjustments to your workstation to help you do this. Exercise regularly as told by your health care provider or physical therapist. Avoid risky activities that may cause a cervical sprain. Contact a health care provider if you have: Symptoms that get worse or do not get better after 2 weeks of treatment. Pain that gets worse or does not get better with medicine. New, unexplained symptoms. Sores or irritated skin on your neck from wearing your cervical collar. Get help right away if: You have severe pain. You develop numbness, tingling, or weakness in any part of your body. You cannot move a part of your body (you have paralysis). You have neck pain along with severe dizziness or headache. Summary A cervical sprain is a stretch or tear in one or more of the ligaments in the neck. Cervical sprains may be caused by trauma, such as an injury from a motor vehicle accident, a   fall, or a sudden forward and backward whipping movement of the head and neck (whiplash injury). Symptoms may develop right away after injury, or they may develop over a few days. This condition may be treated with rest, ice, heat, medicines, physical therapy, and surgery. This information is not intended to replace advice given to you by your health care provider. Make sure you discuss any questions you have with your health care provider. Document Revised: 03/05/2019 Document Reviewed: 03/05/2019 [employer] Patient Education  2022 [employer]  

## 2021-01-05 ENCOUNTER — Encounter: Payer: Self-pay | Admitting: Internal Medicine

## 2021-01-07 ENCOUNTER — Ambulatory Visit: Payer: Medicaid Other | Admitting: Internal Medicine

## 2021-01-07 ENCOUNTER — Encounter: Payer: Self-pay | Admitting: Internal Medicine

## 2021-01-08 ENCOUNTER — Encounter: Payer: Self-pay | Admitting: Internal Medicine

## 2021-01-09 NOTE — Progress Notes (Signed)
Subjective:    Patient ID: Katie Sherman, female    DOB: 1966-03-04, 55 y.o.   MRN: [id number]  This a 55 year old female referred for hemoptysis difficulty swallowing and neck fullness, neck pain and supraclavicular fullness bilaterally of 4 weeks duration.  The patient's primary care provider is Dr. Wynetta Emery.  The patient's hemoptysis is pure blood with clots not mixed with mucus.  She had an episode of minimal hemoptysis a year ago last summer in 2021.  This went away she then developed COVID to beginning portion of 2022 recovered from this.  Patient began having neck swelling and pain 4 weeks ago and presented for evaluations both in the clinic in the emergency room.  Evaluations were not revealing.  At that time she was not having hemoptysis but over the past week she has had increased coughing up of blood combination with progressive swelling in the neck in the supraclavicular areas.  She is also having difficulty with swallowing as well.  Patient does smoke a pack a day of cigarettes and is under a lot of stress unable to reduce her tobacco intake.  She does have some dyspnea on exertion but no wheezing.  She is never been diagnosed with pneumonia.  She did have a CT of the chest in February of this year showing some patchy basilar groundglass changes.  She has also chronic subpleural nodules which have been unchanged compared to previous exam in 2021 versus February 2022.  The patient's been out of work the last 2 weeks because of the difficulty she is now experiencing.  Patient's past medical history significant for cervical cancer in her self and family history of breast cancer.  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   Past Medical History:  Diagnosis Date   Bilateral carpal  tunnel syndrome 03/19/2018   Cervical cancer ([place])    Cervical cancer ([place])    History of exercise stress test    ETT-Echo 5/18: normal EF, no ischemia   History of loop recorder    currently on person   Kidney stone      Family History  Problem Relation Age of Onset   Heart attack Mother 73   Heart disease Father    Heart attack Father 61     Social History   Socioeconomic History   Marital status: Widowed    Spouse name: Not on file   Number of children: Not on file   Years of education: Not on file   Highest education level: Not on file  Occupational History   Occupation: unemployed  Tobacco Use   Smoking status: Every Day    Packs/day: 1.00    Years: 36.00    Pack years: 36.00    Types: Cigarettes    Start date: 30   Smokeless tobacco: Never   Tobacco comments:    3 months without  Vaping Use   Vaping Use: Never used  Substance and Sexual Activity   Alcohol use: No   Drug use: No    Types: Marijuana    Comment: stopped 04/06/2017   Sexual activity: Not Currently    Birth control/protection: None  Other Topics Concern   Not on file  Social History Narrative   Prior traffic control specialist - unemployed now   Widow   5 children,  10 grandchildren   Moved here from [place] 2013   Social Determinants of Health   Financial Resource Strain: Not on file  Food Insecurity: Not on file  Transportation Needs: Not on file  Physical Activity: Not on file  Stress: Not on file  Social Connections: Not on file  Intimate Partner Violence: Not on file     No Known Allergies   Outpatient Medications Prior to Visit  Medication Sig Dispense Refill   acyclovir (ZOVIRAX) 400 MG tablet Take 1 tablet (400 mg total) by mouth 3 (three) times daily. 21 tablet 0   albuterol (VENTOLIN HFA) 108 (90 Base) MCG/ACT inhaler Inhale 2 puffs into the lungs every 6 (six) hours as needed for wheezing or shortness of breath. 8 g 2   cyclobenzaprine (FLEXERIL) 10 MG tablet TAKE 1  TABLET (10 MG TOTAL) BY MOUTH DAILY AS NEEDED FOR MUSCLE SPASMS. 30 tablet 2   gabapentin (NEURONTIN) 400 MG capsule TAKE 1 CAPSULE (400 MG TOTAL) BY MOUTH 2 (TWO) TIMES DAILY. 60 capsule 0   ibuprofen (ADVIL) 600 MG tablet Take 1 tablet (600 mg total) by mouth every 8 (eight) hours as needed. 30 tablet 0   lamoTRIgine (LAMICTAL) 100 MG tablet TAKE 3 TABLETS (300 MG TOTAL) BY MOUTH DAILY. 90 tablet 0   lidocaine (LIDODERM) 5 % Place 1 patch onto the skin daily. Remove & Discard patch within 12 hours or as directed by MD 30 patch 0   meloxicam (MOBIC) 7.5 MG tablet Take 1 tablet (7.5 mg total) by mouth daily. 30 tablet 0   omeprazole (PRILOSEC) 20 MG capsule Take 1 capsule (20 mg total) by mouth daily. 90 capsule 3   pravastatin (PRAVACHOL) 20 MG tablet Take 1 tablet (20 mg total) by mouth daily. 30 tablet 3   QUEtiapine (SEROQUEL) 200 MG tablet TAKE 1 TABLET (200 MG TOTAL) BY MOUTH AT BEDTIME. 30 tablet 0   traZODone (DESYREL) 50 MG tablet TAKE 1 TABLET (50 MG TOTAL) BY MOUTH AT BEDTIME AS NEEDED FOR SLEEP. 30 tablet 0   ondansetron (ZOFRAN) 4 MG tablet TAKE 1 TABLET (4 MG TOTAL) BY MOUTH DAILY AS NEEDED FOR NAUSEA OR VOMITING. (Patient not taking: Reported on 01/11/2021) 20 tablet 0   methocarbamol (ROBAXIN) 500 MG tablet Take 1 tablet (500 mg total) by mouth every 8 (eight) hours as needed for muscle spasms. 20 tablet 0   valACYclovir (VALTREX) 1000 MG tablet TAKE 1 TABLET (1,000 MG TOTAL) BY MOUTH 3 (THREE) TIMES DAILY FOR 7 DAYS. 21 tablet 0   vortioxetine HBr (TRINTELLIX) 10 MG TABS tablet Take 1 tablet (10 mg total) by mouth daily. 28 tablet 0   No facility-administered medications prior to visit.      Review of Systems  Constitutional:  Positive for activity change and fatigue.  HENT:  Positive for dental problem. Negative for nosebleeds.   Respiratory:  Positive for cough, choking and shortness of breath. Negative for wheezing and stridor.        Hemoptysis  Cardiovascular: Negative.    Gastrointestinal: Negative.   Genitourinary: Negative.   Musculoskeletal:  Positive for neck pain.  Skin: Negative.   Hematological:  Positive for adenopathy.  Psychiatric/Behavioral:  Positive for dysphoric mood. Negative for self-injury and suicidal ideas. The patient is nervous/anxious.       Objective:   Physical Exam Vitals:   01/11/21 1342  BP: 122/78  Pulse: 88  Resp: 16  SpO2: 95%  Weight: 169 lb 12.8 oz (77 kg)  Gen: Pleasant, well-nourished, in no distress,  anxious affect  ENT: poor dentition  mouth clear,  oropharynx clear, no postnasal drip  Neck: No JVD,neck is full bilaterally.  L>R supraclavicular fullness  Lungs: No use of accessory muscles, no dullness to percussion, clear without rales or rhonchi  Cardiovascular: RRR, heart sounds normal, no murmur or gallops, no peripheral edema  Abdomen: soft and NT, no HSM,  BS normal  Musculoskeletal: No deformities, no cyanosis or clubbing  Neuro: alert, non focal  Skin: Warm, no lesions or rashes  CT 08/2020 reviewed   chronic unchanged subpleural nodules        Assessment & Plan:   I personally reviewed all images and lab data in the [facility] system as well as any outside material available during this office visit and agree with the  radiology impressions.   Hemoptysis Hemoptysis with associated neck fullness and left greater than right supraclavicular fossa fullness along with neck pain and dysphagia.  Despite CT of the chest in February this year not showing pathology I am concerned the association of hemoptysis along with the neck fullness and supraclavicular fullness is concerning.  It we also need to rule out superior vena cava syndrome with obstruction.  Patient is a heavy smoker and is at risk for malignancy.  She is also had history of cervical cancer in the past and has a positive family history for breast cancer.  Plan is to image both the neck and chest with contrast and depending upon this refer  to pulmonary or otolaryngology for direct laryngoscopy and/or bronchoscopy due to hemoptysis  Patient advised to reduce amount of tobacco intake is much as possible  Preop testing with a BMP will be obtained for creatinine clearance assessment as there is not been any labs since the summer 2021   Cidney was seen today for hemoptysis.  Diagnoses and all orders for this visit:  Hemoptysis -     CT Angio Chest Pulmonary Embolism (PE) W or WO Contrast; Future -     CT SOFT TISSUE NECK W CONTRAST; Future  Preop testing -     Basic Metabolic Panel  Supraclavicular fossa fullness -     CT Angio Chest Pulmonary Embolism (PE) W or WO Contrast; Future -     CT SOFT TISSUE NECK W CONTRAST; Future  Pharyngeal dysphagia -     CT Angio Chest Pulmonary Embolism (PE) W or WO Contrast; Future -     CT SOFT TISSUE NECK W CONTRAST; Future  Neck fullness -     CT Angio Chest Pulmonary Embolism (PE) W or WO Contrast; Future -     CT SOFT TISSUE NECK W CONTRAST; Future  42 min spent in consultation review all prior records labs and imaging in [facility] and complex medical decision making

## 2021-01-10 ENCOUNTER — Encounter: Payer: Self-pay | Admitting: Internal Medicine

## 2021-01-11 ENCOUNTER — Other Ambulatory Visit: Payer: Self-pay

## 2021-01-11 ENCOUNTER — Encounter: Payer: Self-pay | Admitting: Family Medicine

## 2021-01-11 ENCOUNTER — Other Ambulatory Visit: Payer: Self-pay | Admitting: Internal Medicine

## 2021-01-11 ENCOUNTER — Ambulatory Visit: Payer: Self-pay | Attending: Critical Care Medicine | Admitting: Critical Care Medicine

## 2021-01-11 ENCOUNTER — Encounter: Payer: Self-pay | Admitting: Critical Care Medicine

## 2021-01-11 ENCOUNTER — Encounter: Payer: Self-pay | Admitting: Internal Medicine

## 2021-01-11 VITALS — BP 122/78 | HR 88 | Resp 16 | Wt 169.8 lb

## 2021-01-11 DIAGNOSIS — R222 Localized swelling, mass and lump, trunk: Secondary | ICD-10-CM | POA: Insufficient documentation

## 2021-01-11 DIAGNOSIS — R1313 Dysphagia, pharyngeal phase: Secondary | ICD-10-CM

## 2021-01-11 DIAGNOSIS — Z01818 Encounter for other preprocedural examination: Secondary | ICD-10-CM

## 2021-01-11 DIAGNOSIS — R042 Hemoptysis: Secondary | ICD-10-CM

## 2021-01-11 DIAGNOSIS — R221 Localized swelling, mass and lump, neck: Secondary | ICD-10-CM | POA: Insufficient documentation

## 2021-01-11 HISTORY — DX: Hemoptysis: R04.2

## 2021-01-11 HISTORY — DX: Dysphagia, pharyngeal phase: R13.13

## 2021-01-11 MED ORDER — ACYCLOVIR 400 MG PO TABS
400.0000 mg | ORAL_TABLET | Freq: Three times a day (TID) | ORAL | 0 refills | Status: DC
  Filled 2021-01-11: qty 21, 7d supply, fill #0

## 2021-01-11 NOTE — Assessment & Plan Note (Signed)
Hemoptysis with associated neck fullness and left greater than right supraclavicular fossa fullness along with neck pain and dysphagia.  Despite CT of the chest in February this year not showing pathology I am concerned the association of hemoptysis along with the neck fullness and supraclavicular fullness is concerning.  It we also need to rule out superior vena cava syndrome with obstruction.  Patient is a heavy smoker and is at risk for malignancy.  She is also had history of cervical cancer in the past and has a positive family history for breast cancer.  Plan is to image both the neck and chest with contrast and depending upon this refer to pulmonary or otolaryngology for direct laryngoscopy and/or bronchoscopy due to hemoptysis  Patient advised to reduce amount of tobacco intake is much as possible  Preop testing with a BMP will be obtained for creatinine clearance assessment as there is not been any labs since the summer 2021

## 2021-01-11 NOTE — Patient Instructions (Signed)
Imaging of the neck and chest will be obtained with contrast by way of a CAT scan  Preop testing with a metabolic profiles obtained today to evaluate your kidney function in order to give contrast  Dr. Joya Gaskins will call you with results and further testing may involve a bronchoscopy and upper endoscopy to evaluate your upper and lower airways this will require referral to a specialist Dr. Joya Gaskins will communicate with you about this depending upon the result of the CAT scans  Try to limit the amount of tobacco use you are taking and if can

## 2021-01-12 ENCOUNTER — Encounter: Payer: Self-pay | Admitting: Internal Medicine

## 2021-01-12 ENCOUNTER — Other Ambulatory Visit: Payer: Self-pay | Admitting: Internal Medicine

## 2021-01-12 ENCOUNTER — Encounter: Payer: Self-pay | Admitting: Family Medicine

## 2021-01-12 ENCOUNTER — Other Ambulatory Visit: Payer: Self-pay

## 2021-01-12 LAB — BASIC METABOLIC PANEL
BUN/Creatinine Ratio: 12 (ref 9–23)
BUN: 11 mg/dL (ref 6–24)
CO2: 25 mmol/L (ref 20–29)
Calcium: 8.8 mg/dL (ref 8.7–10.2)
Chloride: 96 mmol/L (ref 96–106)
Creatinine, Ser: 0.93 mg/dL (ref 0.57–1.00)
Glucose: 114 mg/dL — ABNORMAL HIGH (ref 65–99)
Potassium: 3.8 mmol/L (ref 3.5–5.2)
Sodium: 137 mmol/L (ref 134–144)
eGFR: 73 mL/min/{1.73_m2} (ref >59)

## 2021-01-12 LAB — HSV I/II IGM RFLX I/II TYPE SP: HSVI/II Comb IgM: 1.85 Ratio — ABNORMAL HIGH (ref 0.00–0.90)

## 2021-01-12 LAB — HSV(HERPES SIMPLEX VRS) I + II AB-IGG
HSV 1 Glycoprotein G Ab, IgG: 11.6 index — ABNORMAL HIGH (ref 0.00–0.90)
HSV 2 IgG, Type Spec: 12.3 index — ABNORMAL HIGH (ref 0.00–0.90)

## 2021-01-12 MED ORDER — TRAMADOL HCL 50 MG PO TABS
50.0000 mg | ORAL_TABLET | Freq: Two times a day (BID) | ORAL | 0 refills | Status: DC | PRN
  Filled 2021-01-12: qty 20, 10d supply, fill #0

## 2021-01-13 ENCOUNTER — Other Ambulatory Visit: Payer: Self-pay

## 2021-01-13 ENCOUNTER — Encounter: Payer: Self-pay | Admitting: Internal Medicine

## 2021-01-14 ENCOUNTER — Ambulatory Visit (HOSPITAL_COMMUNITY): Admission: RE | Admit: 2021-01-14 | Payer: Medicaid Other | Source: Ambulatory Visit

## 2021-01-16 ENCOUNTER — Encounter: Payer: Self-pay | Admitting: Internal Medicine

## 2021-01-18 ENCOUNTER — Ambulatory Visit (HOSPITAL_COMMUNITY)
Admission: RE | Admit: 2021-01-18 | Discharge: 2021-01-18 | Disposition: A | Discharge status: Home / Self Care | Payer: Self-pay | Source: Ambulatory Visit | Attending: Critical Care Medicine | Admitting: Critical Care Medicine

## 2021-01-18 ENCOUNTER — Other Ambulatory Visit: Payer: Self-pay

## 2021-01-18 DIAGNOSIS — R222 Localized swelling, mass and lump, trunk: Secondary | ICD-10-CM | POA: Insufficient documentation

## 2021-01-18 DIAGNOSIS — R1313 Dysphagia, pharyngeal phase: Secondary | ICD-10-CM | POA: Insufficient documentation

## 2021-01-18 DIAGNOSIS — R042 Hemoptysis: Secondary | ICD-10-CM | POA: Insufficient documentation

## 2021-01-18 DIAGNOSIS — R221 Localized swelling, mass and lump, neck: Secondary | ICD-10-CM | POA: Insufficient documentation

## 2021-01-18 MED ORDER — SODIUM CHLORIDE (PF) 0.9 % IJ SOLN
INTRAMUSCULAR | Status: AC
  Filled 2021-01-18: qty 50

## 2021-01-18 MED ORDER — IOHEXOL 350 MG/ML SOLN
75.0000 mL | Freq: Once | INTRAVENOUS | Status: AC | PRN
  Administered 2021-01-18: 100 mL via INTRAVENOUS

## 2021-01-19 ENCOUNTER — Telehealth: Payer: Self-pay | Admitting: Emergency Medicine

## 2021-01-19 ENCOUNTER — Other Ambulatory Visit: Payer: Self-pay

## 2021-01-19 ENCOUNTER — Other Ambulatory Visit: Payer: Self-pay | Admitting: Critical Care Medicine

## 2021-01-19 ENCOUNTER — Telehealth: Payer: Self-pay | Admitting: Critical Care Medicine

## 2021-01-19 DIAGNOSIS — M62830 Muscle spasm of back: Secondary | ICD-10-CM

## 2021-01-19 MED ORDER — CYCLOBENZAPRINE HCL 10 MG PO TABS
ORAL_TABLET | ORAL | 2 refills | Status: DC
  Filled 2021-01-19: qty 90, fill #0
  Filled 2021-01-26 – 2021-02-02 (×2): qty 30, 30d supply, fill #0
  Filled 2021-03-12: qty 30, 30d supply, fill #1
  Filled 2021-04-11: qty 30, 30d supply, fill #2
  Filled 2021-05-22: qty 30, 30d supply, fill #3
  Filled 2021-06-21: qty 30, 30d supply, fill #4
  Filled 2021-08-01 – 2021-08-08 (×3): qty 30, 30d supply, fill #0

## 2021-01-19 MED ORDER — CYCLOBENZAPRINE HCL 10 MG PO TABS: ORAL_TABLET | ORAL | 2 refills | Status: DC

## 2021-01-19 MED ORDER — OXYCODONE-ACETAMINOPHEN 10-325 MG PO TABS: 1.0000 | ORAL_TABLET | Freq: Four times a day (QID) | ORAL | 0 refills | Status: DC | PRN

## 2021-01-19 MED ORDER — OXYCODONE-ACETAMINOPHEN 10-325 MG PO TABS: 1.0000 | ORAL_TABLET | Freq: Three times a day (TID) | ORAL | 0 refills | Status: AC | PRN

## 2021-01-19 MED ORDER — TRAMADOL HCL 50 MG PO TABS
50.0000 mg | ORAL_TABLET | Freq: Four times a day (QID) | ORAL | 0 refills | Status: DC | PRN
  Filled 2021-01-19: qty 30, 8d supply, fill #0

## 2021-01-19 NOTE — Telephone Encounter (Signed)
Spoke with pt and scheulded with RB on 01/20/21 at 2pm.

## 2021-01-19 NOTE — Telephone Encounter (Signed)
Called pt and made her aware of the process in her mediastinum and LUL ?CA vs inflammatory. She needs stat pulmonary consult for Bronch and biopsy.  I called Dr Lamonte Sakai and he will see her asap  Sent pain meds for her, this is a very painful process

## 2021-01-20 ENCOUNTER — Telehealth: Payer: Self-pay | Admitting: Internal Medicine

## 2021-01-20 ENCOUNTER — Ambulatory Visit (INDEPENDENT_AMBULATORY_CARE_PROVIDER_SITE_OTHER): Payer: Self-pay | Admitting: Emergency Medicine

## 2021-01-20 ENCOUNTER — Telehealth: Payer: Self-pay

## 2021-01-20 ENCOUNTER — Other Ambulatory Visit: Payer: Self-pay

## 2021-01-20 ENCOUNTER — Telehealth: Payer: Self-pay | Admitting: Emergency Medicine

## 2021-01-20 ENCOUNTER — Encounter: Payer: Self-pay | Admitting: Emergency Medicine

## 2021-01-20 VITALS — BP 112/80 | HR 82 | Temp 98.0°F | Ht 67.0 in | Wt 163.6 lb

## 2021-01-20 DIAGNOSIS — J9859 Other diseases of mediastinum, not elsewhere classified: Secondary | ICD-10-CM

## 2021-01-20 DIAGNOSIS — R9389 Abnormal findings on diagnostic imaging of other specified body structures: Secondary | ICD-10-CM | POA: Insufficient documentation

## 2021-01-20 NOTE — Telephone Encounter (Signed)
LMTCB

## 2021-01-20 NOTE — Patient Instructions (Addendum)
We will obtain a PET scan as quickly as possible to better characterize your left neck and upper chest mass. Dr. Lamonte Sakai will discuss your case with interventional radiology and with thoracic oncology so that we can make a plan to get a biopsy of the mass as soon as possible. Follow with Dr Lamonte Sakai available after your PET scan so that we can plan your biopsy.

## 2021-01-20 NOTE — Assessment & Plan Note (Signed)
Apparent mass that extends from the neck down to the anterior mediastinum and supraclavicular area involving the subclavian vein, responsible for venous congestion and neck swelling.  Part of the lesion extends to the very superior left upper lobe, question whether this may have been a soft tissue mass versus a primary lung cancer.  I think she needs needle biopsy.  I will perform a PET scan to better characterize, determine best target.  Then review with IR and with thoracic oncology to plan a biopsy strategy.  We will obtain a PET scan as quickly as possible to better characterize your left neck and upper chest mass. Dr. Lamonte Sakai will discuss your case with interventional radiology and with thoracic oncology so that we can make a plan to get a biopsy of the mass as soon as possible. Follow with Dr Lamonte Sakai available after your PET scan so that we can plan your biopsy.

## 2021-01-20 NOTE — Telephone Encounter (Signed)
Katie Sherman with [facility]iology called to be sure PCP aware of CT results. Informed of Dr. Bettina Gavia notation in Epic under imaging. Verbalizes understanding.

## 2021-01-20 NOTE — Progress Notes (Signed)
Subjective:    Patient ID: Katie Sherman, female    DOB: 05/14/1966, 55 y.o.   MRN: [id number]  HPI 55 year old active smoker (53 pack years) with a history of remote cervical cancer, renal calculi followed by Dr. Asencion Noble.    She has been dealing with hemoptysis, left-sided neck fullness and pain, supraclavicular fossa fullness.This prompted CT of the neck and CT venogram of the chest on 7/12 which I have reviewed as below.  She is referred today for evaluation of the abnormal CT.  She reports that she has lost 20-30 lbs over several months. She is having some swallowing problems, difficulty eating and taking pills.   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  The right subclavian, IJ, SVC are patent.  There is an enhancing infiltrative anterior mediastinal anterior left upper lobe mass lesion extending across the midline and narrowing the left subclavian.  2 cm masslike process in the anterior left upper lobe contiguous with infiltrating process in the anterior mediastinum, adjacent groundglass opacities and smaller satellite nodules   Review of Systems As per HPI  Past Medical History:  Diagnosis Date   Bilateral carpal tunnel syndrome 03/19/2018   Cervical cancer ([place])    Cervical cancer ([place])    History of exercise stress test    ETT-Echo 5/18: normal EF, no ischemia   History of loop recorder    currently on person   Kidney stone      Family History  Problem Relation Age of Onset   Heart attack Mother 70   Heart disease Father    Heart attack Father 68    Family history positive for breast cancer  Social History   Socioeconomic History   Marital status: Widowed    Spouse name: Not on file   Number of children: Not on file   Years of education: Not on file   Highest education level: Not on file  Occupational History    Occupation: unemployed  Tobacco Use   Smoking status: Every Day    Packs/day: 1.00    Years: 36.00    Pack years: 36.00    Types: Cigarettes    Start date: 1983   Smokeless tobacco: Never   Tobacco comments:    Smokes 1 pack a day ARJ 01/20/21  Vaping Use   Vaping Use: Never used  Substance and Sexual Activity   Alcohol use: No   Drug use: No    Types: Marijuana    Comment: stopped 04/06/2017   Sexual activity: Not Currently    Birth control/protection: None  Other Topics Concern   Not on file  Social History Narrative   Prior traffic control specialist - unemployed now   Widow   5 children, 10 grandchildren   Moved here from [place] 2013   Social Determinants of Health   Financial Resource Strain: Not on file  Food Insecurity: Not on file  Transportation Needs: Not on file  Physical Activity: Not on file  Stress: Not on file  Social Connections: Not on file  Intimate Partner Violence: Not on file     No Known Allergies   Outpatient Medications Prior to Visit  Medication Sig Dispense Refill   acyclovir (ZOVIRAX) 400 MG tablet Take 1 tablet (400 mg total) by mouth 3 (three) times daily. 21 tablet 0   albuterol (VENTOLIN HFA) 108 (90 Base) MCG/ACT inhaler  Inhale 2 puffs into the lungs every 6 (six) hours as needed for wheezing or shortness of breath. 8 g 2   cyclobenzaprine (FLEXERIL) 10 MG tablet TAKE 1 TABLET (10 MG TOTAL) BY MOUTH DAILY AS NEEDED FOR MUSCLE SPASMS. 90 tablet 2   gabapentin (NEURONTIN) 400 MG capsule TAKE 1 CAPSULE (400 MG TOTAL) BY MOUTH 2 (TWO) TIMES DAILY. 60 capsule 0   ibuprofen (ADVIL) 600 MG tablet Take 1 tablet (600 mg total) by mouth every 8 (eight) hours as needed. 30 tablet 0   lamoTRIgine (LAMICTAL) 100 MG tablet TAKE 3 TABLETS (300 MG TOTAL) BY MOUTH DAILY. 90 tablet 0   lidocaine (LIDODERM) 5 % Place 1 patch onto the skin daily. Remove & Discard patch within 12 hours or as directed by MD 30 patch 0   meloxicam (MOBIC) 7.5 MG tablet Take  1 tablet (7.5 mg total) by mouth daily. 30 tablet 0   omeprazole (PRILOSEC) 20 MG capsule Take 1 capsule (20 mg total) by mouth daily. 90 capsule 3   ondansetron (ZOFRAN) 4 MG tablet TAKE 1 TABLET (4 MG TOTAL) BY MOUTH DAILY AS NEEDED FOR NAUSEA OR VOMITING. 20 tablet 0   oxyCODONE-acetaminophen (PERCOCET) 10-325 MG tablet Take 1 tablet by mouth every 8 (eight) hours as needed for up to 5 days for pain. 45 tablet 0   pravastatin (PRAVACHOL) 20 MG tablet Take 1 tablet (20 mg total) by mouth daily. 30 tablet 3   QUEtiapine (SEROQUEL) 200 MG tablet TAKE 1 TABLET (200 MG TOTAL) BY MOUTH AT BEDTIME. 30 tablet 0   traZODone (DESYREL) 50 MG tablet TAKE 1 TABLET (50 MG TOTAL) BY MOUTH AT BEDTIME AS NEEDED FOR SLEEP. 30 tablet 0   No facility-administered medications prior to visit.         Objective:   Physical Exam  Vitals:   01/20/21 1406  BP: 112/80  Pulse: 82  Temp: 98 F (36.7 C)  TempSrc: Oral  SpO2: 97%  Weight: 163 lb 9.6 oz (74.2 kg)  Height: 5\' 7"  (1.702 m)   Gen: Pleasant, ill-appearing, in no distress,  normal affect  ENT: No lesions,  mouth clear,  oropharynx clear, no postnasal drip  Neck: Point tenderness left lower neck as well as the upper left chest, fullness but non pulsatile  Lungs: No use of accessory muscles, distant without wheezing or crackles  Cardiovascular: RRR, heart sounds normal, no murmur or gallops, no peripheral edema  Musculoskeletal: No deformities, no cyanosis or clubbing  Neuro: alert, awake, non focal  Skin: Warm, no lesions or rash     Assessment & Plan:  Abnormal CT of the chest Apparent mass that extends from the neck down to the anterior mediastinum and supraclavicular area involving the subclavian vein, responsible for venous congestion and neck swelling.  Part of the lesion extends to the very superior left upper lobe, question whether this may have been a soft tissue mass versus a primary lung cancer.  I think she needs needle  biopsy.  I will perform a PET scan to better characterize, determine best target.  Then review with IR and with thoracic oncology to plan a biopsy strategy.  We will obtain a PET scan as quickly as possible to better characterize your left neck and upper chest mass. Dr. Lamonte Sakai will discuss your case with interventional radiology and with thoracic oncology so that we can make a plan to get a biopsy of the mass as soon as possible. Follow with Dr Lamonte Sakai available after  your PET scan so that we can plan your biopsy.    Baltazar Apo, MD, PhD 01/20/2021, 5:53 PM  Pulmonary and Critical Care [phone] or if no answer before 7:00PM call [phone] For any issues after 7:00PM please call eLink [phone]

## 2021-01-20 NOTE — Telephone Encounter (Signed)
Copied from [place] [id number]. Topic: General - Other >> Jan 20, 2021  9:02 AM Loma Boston wrote: Reason for CRM: [facility]  Radiology, Diane calling wanting to speak with Dr Bettina Gavia nurse, not available, but upon further questioning the office is just wanting someone to confirm back to them that Dr Joya Gaskins is aware of the results re: Neck CT scan. Want a cb to acknowledge aware. Call [phone].

## 2021-01-21 NOTE — Telephone Encounter (Signed)
Dr. Joya Gaskins has already spoken to pt in regards to results

## 2021-01-26 ENCOUNTER — Telehealth: Payer: Self-pay | Admitting: Emergency Medicine

## 2021-01-26 ENCOUNTER — Other Ambulatory Visit: Payer: Self-pay

## 2021-01-26 NOTE — Telephone Encounter (Signed)
Tried to call pt to make her aware PET scans are being booked that far out now and to give her phone # to [employer] - she can call at anytime to see if someone has cancelled.  Had to leave a vm for pt to call me back.

## 2021-01-26 NOTE — Telephone Encounter (Signed)
Pt is getting moved to [facility] sooner

## 2021-01-26 NOTE — Telephone Encounter (Signed)
Called and spoke with patient. She stated that she was calling to see if she could have her PET scan sooner. She is currently scheduled to have the PET scan on 02/08/21 and see RB on 02/09/21. She does not want to wait that long for the PET. I advised her that I could check with the PCCs to see if we could move the PET date up and then see about moving up her OV afterwards. She verbalized understanding.   PCCs, can you all please advise if the PET scan be scheduled sooner? Thanks!

## 2021-01-27 ENCOUNTER — Other Ambulatory Visit: Payer: Self-pay | Admitting: *Deleted

## 2021-01-27 NOTE — Progress Notes (Signed)
The proposed treatment discussed in cancer conference is for discussion purpose only and is not a binding recommendation. The patient was not physically examined nor present for their treatment options. Therefore, final treatment plans cannot be decided.  ?

## 2021-01-27 NOTE — Telephone Encounter (Signed)
Called and spoke to pt. Pt states she no longer needs this information. Will close encounter.

## 2021-01-29 LAB — HSV(HERPES SIMPLEX VRS) I + II AB-IGG
HSV 1 Glycoprotein G Ab, IgG: 11.5 index — ABNORMAL HIGH (ref 0.00–0.90)
HSV 2 IgG, Type Spec: 11.8 index — ABNORMAL HIGH (ref 0.00–0.90)

## 2021-01-29 LAB — SPECIMEN STATUS REPORT

## 2021-02-01 ENCOUNTER — Encounter (HOSPITAL_COMMUNITY): Payer: Self-pay | Admitting: Psychiatry

## 2021-02-01 ENCOUNTER — Other Ambulatory Visit: Payer: Self-pay

## 2021-02-01 ENCOUNTER — Telehealth (INDEPENDENT_AMBULATORY_CARE_PROVIDER_SITE_OTHER): Payer: Self-pay | Admitting: Psychiatry

## 2021-02-01 DIAGNOSIS — F319 Bipolar disorder, unspecified: Secondary | ICD-10-CM

## 2021-02-01 DIAGNOSIS — F4310 Post-traumatic stress disorder, unspecified: Secondary | ICD-10-CM

## 2021-02-01 MED ORDER — GABAPENTIN 400 MG PO CAPS
ORAL_CAPSULE | ORAL | 0 refills | Status: DC
  Filled 2021-02-01: qty 60, 30d supply, fill #0

## 2021-02-01 MED ORDER — QUETIAPINE FUMARATE 200 MG PO TABS
ORAL_TABLET | Freq: Every day | ORAL | 0 refills | Status: DC
Start: 2021-02-01 — End: 2021-02-09
  Filled 2021-02-01: qty 30, 30d supply, fill #0

## 2021-02-01 MED ORDER — LAMOTRIGINE 100 MG PO TABS
ORAL_TABLET | ORAL | 0 refills | Status: DC
  Filled 2021-02-01: qty 90, 30d supply, fill #0
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
## 2021-02-02 ENCOUNTER — Telehealth: Payer: Self-pay | Admitting: Emergency Medicine

## 2021-02-02 ENCOUNTER — Other Ambulatory Visit: Payer: Self-pay

## 2021-02-02 MED ORDER — OXYCODONE-ACETAMINOPHEN 10-325 MG PO TABS: 1.0000 | ORAL_TABLET | Freq: Three times a day (TID) | ORAL | 0 refills | Status: DC | PRN

## 2021-02-02 NOTE — Telephone Encounter (Signed)
Refill on pain meds  PDMP checked   no signs drug diversion

## 2021-02-02 NOTE — Telephone Encounter (Signed)
disregard

## 2021-02-02 NOTE — Telephone Encounter (Signed)
Pt needs a refill of her percocet '10mg'$ . Pt states Dr Joya Gaskins prescribed them, but then sent her to Dr Lamonte Sakai. I advised pt to call the dr that prescribed medication, but she still insisited on sending a message back. Please advise [phone] [facility] on [address]

## 2021-02-03 ENCOUNTER — Ambulatory Visit
Admission: RE | Admit: 2021-02-03 | Discharge: 2021-02-03 | Disposition: A | Discharge status: Home / Self Care | Payer: Self-pay | Source: Ambulatory Visit | Attending: Emergency Medicine | Admitting: Emergency Medicine

## 2021-02-03 ENCOUNTER — Other Ambulatory Visit: Payer: Self-pay

## 2021-02-03 DIAGNOSIS — J9859 Other diseases of mediastinum, not elsewhere classified: Secondary | ICD-10-CM | POA: Insufficient documentation

## 2021-02-03 LAB — GLUCOSE, CAPILLARY: Glucose-Capillary: 101 mg/dL — ABNORMAL HIGH (ref 70–99)

## 2021-02-03 MED ORDER — FLUDEOXYGLUCOSE F - 18 (FDG) INJECTION
8.1000 | Freq: Once | INTRAVENOUS | Status: AC | PRN
  Administered 2021-02-03: 8.41 via INTRAVENOUS

## 2021-02-08 ENCOUNTER — Ambulatory Visit (HOSPITAL_COMMUNITY): Payer: Medicaid Other

## 2021-02-09 ENCOUNTER — Ambulatory Visit (INDEPENDENT_AMBULATORY_CARE_PROVIDER_SITE_OTHER): Payer: Medicaid Other | Admitting: Emergency Medicine

## 2021-02-09 ENCOUNTER — Other Ambulatory Visit: Payer: Self-pay

## 2021-02-09 ENCOUNTER — Encounter: Payer: Self-pay | Admitting: Emergency Medicine

## 2021-02-09 VITALS — BP 112/70 | HR 80 | Ht 67.0 in | Wt 160.4 lb

## 2021-02-09 DIAGNOSIS — R222 Localized swelling, mass and lump, trunk: Secondary | ICD-10-CM

## 2021-02-09 NOTE — Patient Instructions (Addendum)
We will work on scheduling a needle biopsy of your left chest mass soon as possible.  Dr. Lamonte Sakai has reviewed with Interventional Radiology We will make a referral to Oncology as soon as you are biopsy results are available so that we can discuss options for treatment Follow with Dr Lamonte Sakai in 1 month

## 2021-02-09 NOTE — Assessment & Plan Note (Signed)
Left chest wall mass with neck lymphadenopathy, extension into muscle skeletal structures, hypermetabolic on PET scan.  Concerning for possible sarcoma, possible lymphoma.  She has symmetrical sublingual lymphadenopathy as well, question whether this is clinically significant or reactive.  She needs tissue diagnosis ASAP.  I did review her CT with interventional radiology at thoracic conference and it was agreed that needle biopsy is indicated.  I will send a stat referral today.

## 2021-02-09 NOTE — Progress Notes (Signed)
Subjective:    Patient ID: Katie Sherman, female    DOB: [birth year], 56 y.o.   MRN: [id number]  HPI 55 year old active smoker (53 pack years) with a history of remote cervical cancer, renal calculi followed by Dr. Asencion Noble.    She has been dealing with hemoptysis, left-sided neck fullness and pain, supraclavicular fossa fullness.This prompted CT of the neck and CT venogram of the chest on 7/12 which I have reviewed as below.  She is referred today for evaluation of the abnormal CT.  She reports that she has lost 20-30 lbs over several months. She is having some swallowing problems, difficulty eating and taking pills.   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  The right subclavian, IJ, SVC are patent.  There is an enhancing infiltrative anterior mediastinal anterior left upper lobe mass lesion extending across the midline and narrowing the left subclavian.  2 cm masslike process in the anterior left upper lobe contiguous with infiltrating process in the anterior mediastinum, adjacent groundglass opacities and smaller satellite nodules  ROV 02/09/21 --Ms. quirindongo is 10, and active smoker (53 pack years) with history of cervical cancer.  I saw her in mid July for evaluation of a left anterior mediastinal mass with impact on her left IJ and subclavian vein.  I reviewed her case in thoracic conference and it was deemed appropriate to work to attempt needle biopsy in IR. She notes that the swelling has moved more lateral and superior. She is very tender in the upper L chest wall.   PET scan 02/03/2021 reviewed by me, shows an infiltrating soft tissue mass involving the left chest wall and mediastinum, into the right sided neck nodes that is hypermetabolic.  Partial destruction of the left anterior first rib without any clear involvement of the clavicle.  Probably  invading the left pectoralis muscle between the left first and second ribs.   Review of Systems As per HPI  Past Medical History:  Diagnosis Date   Bilateral carpal tunnel syndrome 03/19/2018   Cervical cancer ([facility])    Cervical cancer ([place])    History of exercise stress test    ETT-Echo 5/18: normal EF, no ischemia   History of loop recorder    currently on person   Kidney stone      Family History  Problem Relation Age of Onset   Heart attack Mother 22   Heart disease Father    Heart attack Father 23    Family history positive for breast cancer  Social History   Socioeconomic History   Marital status: Widowed    Spouse name: Not on file   Number of children: Not on file   Years of education: Not on file   Highest education level: Not on file  Occupational History   Occupation: unemployed  Tobacco Use   Smoking status: Every Day    Packs/day: 1.00    Years: 36.00    Pack years: 36.00    Types: Cigarettes    Start date: 1983   Smokeless tobacco: Never   Tobacco comments:    Smokes 1 pack a day [facility]J 01/20/21  Vaping Use   Vaping Use: Never used  Substance and Sexual Activity   Alcohol use: No   Drug use: No    Types: Marijuana    Comment: stopped 04/06/2017   Sexual activity: Not Currently  Birth control/protection: None  Other Topics Concern   Not on file  Social History Narrative   Prior traffic control specialist - unemployed now   Widow   5 children, 10 grandchildren   Moved here from [place] 2013   Social Determinants of Health   Financial Resource Strain: Not on file  Food Insecurity: Not on file  Transportation Needs: Not on file  Physical Activity: Not on file  Stress: Not on file  Social Connections: Not on file  Intimate Partner Violence: Not on file     No Known Allergies   Outpatient Medications Prior to Visit  Medication Sig Dispense Refill   albuterol (VENTOLIN HFA) 108 (90 Base) MCG/ACT inhaler Inhale 2 puffs into the lungs  every 6 (six) hours as needed for wheezing or shortness of breath. 8 g 2   omeprazole (PRILOSEC) 20 MG capsule Take 1 capsule (20 mg total) by mouth daily. 90 capsule 3   oxyCODONE-acetaminophen (PERCOCET) 10-325 MG tablet Take 1 tablet by mouth every 8 (eight) hours as needed for up to 14 days for pain. 45 tablet 0   acyclovir (ZOVIRAX) 400 MG tablet Take 1 tablet (400 mg total) by mouth 3 (three) times daily. 21 tablet 0   cyclobenzaprine (FLEXERIL) 10 MG tablet TAKE 1 TABLET (10 MG TOTAL) BY MOUTH DAILY AS NEEDED FOR MUSCLE SPASMS. 90 tablet 2   lamoTRIgine (LAMICTAL) 100 MG tablet TAKE 3 TABLETS (300 MG TOTAL) BY MOUTH DAILY. 90 tablet 0   meloxicam (MOBIC) 7.5 MG tablet Take 1 tablet (7.5 mg total) by mouth daily. 30 tablet 0   pravastatin (PRAVACHOL) 20 MG tablet Take 1 tablet (20 mg total) by mouth daily. 30 tablet 3   gabapentin (NEURONTIN) 400 MG capsule TAKE 1 CAPSULE (400 MG TOTAL) BY MOUTH 2 (TWO) TIMES DAILY. (Patient not taking: Reported on 02/09/2021) 60 capsule 0   ibuprofen (ADVIL) 600 MG tablet Take 1 tablet (600 mg total) by mouth every 8 (eight) hours as needed. 30 tablet 0   lidocaine (LIDODERM) 5 % Place 1 patch onto the skin daily. Remove & Discard patch within 12 hours or as directed by MD 30 patch 0   ondansetron (ZOFRAN) 4 MG tablet TAKE 1 TABLET (4 MG TOTAL) BY MOUTH DAILY AS NEEDED FOR NAUSEA OR VOMITING. 20 tablet 0   QUEtiapine (SEROQUEL) 200 MG tablet TAKE 1 TABLET (200 MG TOTAL) BY MOUTH AT BEDTIME. 30 tablet 0   traZODone (DESYREL) 50 MG tablet TAKE 1 TABLET (50 MG TOTAL) BY MOUTH AT BEDTIME AS NEEDED FOR SLEEP. 30 tablet 0   No facility-administered medications prior to visit.         Objective:   Physical Exam  Vitals:   02/09/21 1030  BP: 112/70  Pulse: 80  SpO2: 98%  Weight: 160 lb 6.4 oz (72.8 kg)  Height: '5\' 7"'$  (1.702 m)   Gen: Pleasant, ill-appearing, in no distress,  normal affect  ENT: No lesions,  mouth clear,  oropharynx clear, no postnasal  drip  Neck: Point tenderness left lower neck as well as the upper left chest, fullness but non pulsatile  Lungs: No use of accessory muscles, distant without wheezing or crackles  Cardiovascular: RRR, heart sounds normal, no murmur or gallops, no peripheral edema  Musculoskeletal: significant pain on palp of Left upper chest  Neuro: alert, awake, non focal  Skin: Warm, no lesions or rash     Assessment & Plan:  Mass of left chest wall Left chest wall mass  with neck lymphadenopathy, extension into muscle skeletal structures, hypermetabolic on PET scan.  Concerning for possible sarcoma, possible lymphoma.  She has symmetrical sublingual lymphadenopathy as well, question whether this is clinically significant or reactive.  She needs tissue diagnosis ASAP.  I did review her CT with interventional radiology at thoracic conference and it was agreed that needle biopsy is indicated.  I will send a stat referral today.  Time spent 30 minutes  Baltazar Apo, MD, PhD 02/09/2021, 10:49 AM [facility] [phone] or if no answer before 7:00PM call [phone] For any issues after 7:00PM please call eLink [phone]

## 2021-02-10 ENCOUNTER — Encounter: Payer: Self-pay | Admitting: *Deleted

## 2021-02-10 ENCOUNTER — Encounter (HOSPITAL_COMMUNITY): Payer: Self-pay | Admitting: Radiology

## 2021-02-10 ENCOUNTER — Telehealth: Payer: Self-pay | Admitting: Emergency Medicine

## 2021-02-10 ENCOUNTER — Ambulatory Visit (INDEPENDENT_AMBULATORY_CARE_PROVIDER_SITE_OTHER): Payer: Self-pay | Admitting: Licensed Clinical Social Worker

## 2021-02-10 DIAGNOSIS — F411 Generalized anxiety disorder: Secondary | ICD-10-CM

## 2021-02-10 DIAGNOSIS — F330 Major depressive disorder, recurrent, mild: Secondary | ICD-10-CM

## 2021-02-10 DIAGNOSIS — F4310 Post-traumatic stress disorder, unspecified: Secondary | ICD-10-CM

## 2021-02-10 NOTE — Progress Notes (Signed)
Patient Name  Benay, Szuch Legal Sex  Female DOB  21-Sep-1965 SSN  [id number] Address  [address] [place] [address] Phone  [phone] [facility])  [phone] (Mobile)     RE: CT US GUIDED BIOPSY Received: Today Markus Daft, MD  Garth Bigness D US guided core biopsy of right cervical lymph node vs left chest mass.  Probably will be lymph node.   [place]         Previous Messages    ----- Message -----  From: Garth Bigness D  Sent: 02/09/2021  11:26 AM EDT  To: Ir Procedure Requests  Subject: CT US GUIDED BIOPSY                             Procedure:  CT US GUIDED BIOPSY   Reason:  Mass of chest wall, left, Left chest mass with associated LAD, have discussed with Radiologist in Thoracic Oncology Confrerence   History:  NM PET, CT in computer   Provider:  Collene Gobble   Provider Contact:  [phone]

## 2021-02-10 NOTE — Telephone Encounter (Signed)
Pt had PET scan on 7/28. Will close encounter.
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
## 2021-02-10 NOTE — Telephone Encounter (Signed)
Yes - I am ok with writing a letter that states that she is under our evaluation for a newly diagnosed chest mass, that she has been unable to work and will not be able until she has further workup. I would hold her out of work for the 2 weeks she has missed and for all of August.

## 2021-02-10 NOTE — Telephone Encounter (Signed)
Called and spoke with pt letting her know that RB was fine writing a letter and she verbalized understanding. Letter written and placed up front at office for pt. Nothing further needed.

## 2021-02-10 NOTE — Telephone Encounter (Signed)
Called and spoke with patient. She stated that she is currently scheduled for a biopsy on 02/15/21 and has been unable to work. She has been out of work for the past 2 months. She wants to know if RB would be willing to write a letter that states what her current diagnosis is, what procedures she is needing to have done and the fact that she can not work. I advised her that all I can do is ask Dr. Lamonte Sakai. She verbalized understanding.   If the letter gets approved, she wishes to come by the office to pick up the letter.   RB, can you please advise? Thanks.

## 2021-02-14 ENCOUNTER — Other Ambulatory Visit: Payer: Self-pay | Admitting: Radiology

## 2021-02-14 MED ORDER — OXYCODONE-ACETAMINOPHEN 10-325 MG PO TABS: 1.0000 | ORAL_TABLET | Freq: Three times a day (TID) | ORAL | 0 refills | Status: DC | PRN

## 2021-02-14 NOTE — Addendum Note (Signed)
Addended by: Elsie Stain on: 02/14/2021 07:53 AM   Modules accepted: Orders

## 2021-02-15 ENCOUNTER — Ambulatory Visit (HOSPITAL_COMMUNITY)
Admission: RE | Admit: 2021-02-15 | Discharge: 2021-02-15 | Disposition: A | Discharge status: Home / Self Care | Payer: Self-pay | Source: Ambulatory Visit | Attending: Emergency Medicine | Admitting: Emergency Medicine

## 2021-02-15 ENCOUNTER — Encounter (HOSPITAL_COMMUNITY): Payer: Self-pay

## 2021-02-15 ENCOUNTER — Other Ambulatory Visit: Payer: Self-pay

## 2021-02-15 DIAGNOSIS — R590 Localized enlarged lymph nodes: Secondary | ICD-10-CM | POA: Insufficient documentation

## 2021-02-15 DIAGNOSIS — F17210 Nicotine dependence, cigarettes, uncomplicated: Secondary | ICD-10-CM | POA: Insufficient documentation

## 2021-02-15 DIAGNOSIS — Z8249 Family history of ischemic heart disease and other diseases of the circulatory system: Secondary | ICD-10-CM | POA: Insufficient documentation

## 2021-02-15 DIAGNOSIS — M542 Cervicalgia: Secondary | ICD-10-CM | POA: Insufficient documentation

## 2021-02-15 DIAGNOSIS — Z79899 Other long term (current) drug therapy: Secondary | ICD-10-CM | POA: Insufficient documentation

## 2021-02-15 DIAGNOSIS — Z791 Long term (current) use of non-steroidal anti-inflammatories (NSAID): Secondary | ICD-10-CM | POA: Insufficient documentation

## 2021-02-15 DIAGNOSIS — R042 Hemoptysis: Secondary | ICD-10-CM | POA: Insufficient documentation

## 2021-02-15 DIAGNOSIS — R222 Localized swelling, mass and lump, trunk: Secondary | ICD-10-CM | POA: Insufficient documentation

## 2021-02-15 LAB — CBC
HCT: 35.2 % — ABNORMAL LOW (ref 36.0–46.0)
Hemoglobin: 11.3 g/dL — ABNORMAL LOW (ref 12.0–15.0)
MCH: 30.5 pg (ref 26.0–34.0)
MCHC: 32.1 g/dL (ref 30.0–36.0)
MCV: 95.1 fL (ref 80.0–100.0)
Platelets: 341 10*3/uL (ref 150–400)
RBC: 3.7 MIL/uL — ABNORMAL LOW (ref 3.87–5.11)
RDW: 14.1 % (ref 11.5–15.5)
WBC: 6.8 10*3/uL (ref 4.0–10.5)
nRBC: 0 % (ref 0.0–0.2)

## 2021-02-15 LAB — PROTIME-INR
INR: 1 (ref 0.8–1.2)
Prothrombin Time: 13.4 seconds (ref 11.4–15.2)

## 2021-02-15 MED ORDER — MIDAZOLAM HCL 2 MG/2ML IJ SOLN
INTRAMUSCULAR | Status: AC | PRN
  Administered 2021-02-15: 0.5 mg via INTRAVENOUS
  Administered 2021-02-15: 1 mg via INTRAVENOUS

## 2021-02-15 MED ORDER — MIDAZOLAM HCL 2 MG/2ML IJ SOLN
INTRAMUSCULAR | Status: AC
  Filled 2021-02-15: qty 2

## 2021-02-15 MED ORDER — FENTANYL CITRATE (PF) 100 MCG/2ML IJ SOLN
INTRAMUSCULAR | Status: AC
  Filled 2021-02-15: qty 2

## 2021-02-15 MED ORDER — LIDOCAINE HCL (PF) 1 % IJ SOLN
INTRAMUSCULAR | Status: AC
  Filled 2021-02-15: qty 30

## 2021-02-15 MED ORDER — FENTANYL CITRATE (PF) 100 MCG/2ML IJ SOLN
INTRAMUSCULAR | Status: AC | PRN
  Administered 2021-02-15: 50 ug via INTRAVENOUS

## 2021-02-15 MED ORDER — SODIUM CHLORIDE 0.9 % IV SOLN: INTRAVENOUS | Status: DC

## 2021-02-15 NOTE — Procedures (Signed)
Interventional Radiology Procedure Note  Procedure: US guided biopsy of right cervical lymph node.  Unknown primary.  FDG avid on PET Complications: None EBL: None Recommendations: - Bedrest 1 hours.   - Routine wound care - Follow up pathology - Advance diet   Signed,  Corrie Mckusick, DO

## 2021-02-15 NOTE — H&P (Signed)
Chief Complaint: Patient was seen in consultation today for right cervical lymph node biopsy vs left chest mass biopsy at the request of Byrum,Robert S  Referring Physician(s): Bucyrus S  Supervising Physician: Arne Cleveland  Patient Status: [facility] - Out-pt  History of Present Illness: Katie Sherman is a 55 y.o. female   ++smoker Hemoptysis off and on since 1 yr ago Worsening hemoptysis And neck pain-- neck feels "full" and like it "growing"  7/28: PET:  IMPRESSION: 1. Ill-defined hypermetabolic infiltrating soft tissue mass involving the left chest wall, mediastinum and right-sided neck nodes. Findings could be due to an atypical/aggressive lymphoma or sarcoma. 2. Soft tissue thickening and hypermetabolism at the base of the tongue. This appears fairly symmetric and may just be prominent lymphoid tissue but direct visualization is suggested. 3. No significant findings involving the lungs, abdomen/pelvis or bony structures (other than the first left anterior rib).  Was seen by Dr Lamonte Sakai 02/09/21:  Assessment & Plan:  Mass of left chest wall Left chest wall mass with neck lymphadenopathy, extension into muscle skeletal structures, hypermetabolic on PET scan.  Concerning for possible sarcoma, possible lymphoma.  She has symmetrical sublingual lymphadenopathy as well, question whether this is clinically significant or reactive.  She needs tissue diagnosis ASAP.  I did review her CT with interventional radiology at thoracic conference and it was agreed that needle biopsy is indicated  Scheduled now for biopsy of right cervical lymph node vs left chest mass biopsy  Past Medical History:  Diagnosis Date   Bilateral carpal tunnel syndrome 03/19/2018   Cervical cancer ([place])    Cervical cancer ([place])    History of exercise stress test    ETT-Echo 5/18: normal EF, no ischemia   History of loop recorder    currently on person   Kidney stone     Past Surgical History:   Procedure Laterality Date   CERVICAL CONE BIOPSY     TUBAL LIGATION      Allergies: Patient has no known allergies.  Medications: Prior to Admission medications   Medication Sig Start Date End Date Taking? Authorizing Provider  albuterol (VENTOLIN HFA) 108 (90 Base) MCG/ACT inhaler Inhale 2 puffs into the lungs every 6 (six) hours as needed for wheezing or shortness of breath. 08/07/20  Yes Ladell Pier, MD  cyclobenzaprine (FLEXERIL) 10 MG tablet TAKE 1 TABLET (10 MG TOTAL) BY MOUTH DAILY AS NEEDED FOR MUSCLE SPASMS. 01/19/21 01/19/22 Yes Elsie Stain, MD  ibuprofen (ADVIL) 600 MG tablet Take 600 mg by mouth every 8 (eight) hours as needed for moderate pain or headache.   Yes [provider]  lamoTRIgine (LAMICTAL) 100 MG tablet TAKE 3 TABLETS (300 MG TOTAL) BY MOUTH DAILY. Patient taking differently: Take 100 mg by mouth daily. 02/01/21 02/01/22 Yes Arfeen, Arlyce Harman, MD  omeprazole (PRILOSEC) 20 MG capsule Take 1 capsule (20 mg total) by mouth daily. 05/05/20  Yes Ladell Pier, MD  oxyCODONE-acetaminophen (PERCOCET) 10-325 MG tablet Take 1 tablet by mouth every 8 (eight) hours as needed for up to 14 days for pain. 02/14/21 02/28/21 Yes Elsie Stain, MD  acyclovir (ZOVIRAX) 400 MG tablet Take 1 tablet (400 mg total) by mouth 3 (three) times daily. Patient not taking: Reported on 02/14/2021 01/11/21   Ladell Pier, MD  meloxicam (MOBIC) 7.5 MG tablet Take 1 tablet (7.5 mg total) by mouth daily. Patient not taking: Reported on 02/14/2021 01/04/21   Charlott Rakes, MD  pravastatin (PRAVACHOL) 20 MG tablet  Take 1 tablet (20 mg total) by mouth daily. Patient not taking: Reported on 02/14/2021 11/12/20   Ladell Pier, MD     Family History  Problem Relation Age of Onset   Heart attack Mother 68   Heart disease Father    Heart attack Father 46    Social History   Socioeconomic History   Marital status: Widowed    Spouse name: Not on file   Number of children:  Not on file   Years of education: Not on file   Highest education level: Not on file  Occupational History   Occupation: unemployed  Tobacco Use   Smoking status: Every Day    Packs/day: 1.00    Years: 36.00    Pack years: 36.00    Types: Cigarettes    Start date: 1983   Smokeless tobacco: Never   Tobacco comments:    Smokes 1 pack a day ARJ 01/20/21  Vaping Use   Vaping Use: Never used  Substance and Sexual Activity   Alcohol use: No   Drug use: No    Types: Marijuana    Comment: stopped 04/06/2017   Sexual activity: Not Currently    Birth control/protection: None  Other Topics Concern   Not on file  Social History Narrative   Prior traffic control specialist - unemployed now   Widow   5 children, 10 grandchildren   Moved here from [place] 2013   Social Determinants of Health   Financial Resource Strain: Not on file  Food Insecurity: Not on file  Transportation Needs: Not on file  Physical Activity: Not on file  Stress: Not on file  Social Connections: Not on file     Review of Systems: A 12 point ROS discussed and pertinent positives are indicated in the HPI above.  All other systems are negative.  Review of Systems  Constitutional:  Negative for activity change, fatigue and fever.  HENT:  Positive for trouble swallowing.   Respiratory:  Positive for cough. Negative for choking.   Cardiovascular:  Negative for chest pain.  Gastrointestinal:  Negative for abdominal pain.  Neurological:  Negative for weakness.  Psychiatric/Behavioral:  Negative for behavioral problems and confusion.    Vital Signs: LMP 04/29/2012   Physical Exam HENT:     Mouth/Throat:     Mouth: Mucous membranes are moist.  Cardiovascular:     Rate and Rhythm: Normal rate and regular rhythm.     Heart sounds: Normal heart sounds.  Pulmonary:     Effort: Pulmonary effort is normal.     Breath sounds: Normal breath sounds.  Abdominal:     Palpations: Abdomen is soft.   Musculoskeletal:        General: Normal range of motion.  Skin:    General: Skin is warm.  Neurological:     Mental Status: She is alert and oriented to person, place, and time.  Psychiatric:        Behavior: Behavior normal.    Imaging: CT SOFT TISSUE NECK W CONTRAST  Result Date: 01/19/2021 CLINICAL DATA:  Neck mass, nonpulsatile. Dysphagia and hemoptysis with neck fullness. Evaluate for cancer SVC obstruction. EXAM: CT NECK WITH CONTRAST TECHNIQUE: Multidetector CT imaging of the neck was performed using the standard protocol following the bolus administration of intravenous contrast. CONTRAST:  131m OMNIPAQUE IOHEXOL 350 MG/ML SOLN COMPARISON:  08/19/2020 FINDINGS: Pharynx and larynx: No evidence of primary mass or inflammation along the mucosa Salivary glands: No inflammation, mass, or stone. Thyroid:  Normal. Lymph nodes: Bilateral jugular chain adenopathy. A right level 4 node measures 19 mm maximum on axial images. There is an infiltrative process at the left thoracic inlet and upper mediastinum, with sheet like soft tissue thickening posterior to the jugular notch and manubrium. Just anterior to the left IJ brachiocephalic confluence is a cystic appearing component measuring 12 mm. Soft tissue density extends anterior to the left first costochondral junction. There is reticulation and adjacent left upper lobe with partially covered layering left pleural effusion. 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 Limited intracranial: Negative Visualized orbits: Negative Mastoids and visualized paranasal sinuses: Clear Skeleton: No erosion of ribs or joint spaces adjacent to the inflammation, although the left first costochondral junction is wider than the right. Upper chest: Reticular opacity in the subpleural left upper lobe adjacent to the chest wall process. These results will be called to the ordering clinician or representative by the Radiologist  Assistant, and communication documented in the PACS or [employer]. IMPRESSION: Infiltrative process centered on the left chest wall, especially at left first costochondral junction, with regional adenopathy , mediastinal fat infiltration, and apical pulmonary opacity. Atypical infection or malignancy are both possible. Electronically Signed   By: Monte Fantasia M.D.   On: 01/19/2021 22:40   NM PET Image Initial (PI) Skull Base To Thigh  Result Date: 02/05/2021 CLINICAL DATA:  Initial treatment strategy for mediastinal mass. EXAM: NUCLEAR MEDICINE PET SKULL BASE TO THIGH TECHNIQUE: 8.41 mCi F-18 FDG was injected intravenously. Full-ring PET imaging was performed from the skull base to thigh after the radiotracer. CT data was obtained and used for attenuation correction and anatomic localization. Fasting blood glucose: 111 mg/dl COMPARISON:  Neck and chest CT 01/18/2021 FINDINGS: Mediastinal blood pool activity: SUV max 2.56 Liver activity: SUV max 3.24 NECK: Soft tissue thickening and hypermetabolism noted at the base of the. SUV max is 7.72. This could be prominent lymphoid tissue at the base of the tongue or a tongue base mass. Recommend correlation with direct visualization. There are mildly hypermetabolic right level 2 lymph nodes with SUV max of 4.18. There is also a enlarged right level 4 lymph node measuring approximately 13 mm. SUV max is 8.84. Incidental CT findings: none CHEST: Extensive subclavicular soft tissue density as demonstrated on the prior CT scans mainly between the head of the clavicle and the first left rib. SUV max is 13 point thick thick in there appears to be partial destruction of the anterior first left rib. No obvious involvement of the clavicle. Adjacent anterior mediastinal soft tissue density with SUV max of 9.69. There is also tumor in the left chest between the left first and second ribs invading the left pectoralis muscle. SUV max is 11.37. 11.5 mm prevascular node has an  SUV max of 392. No obvious pulmonary lesions or pulmonary nodules. Mild diffuse uptake involving the distal esophagus without mass. This is likely due to esophagitis. Incidental CT findings: Emphysematous changes and small hiatal hernia. ABDOMEN/PELVIS: No abnormal hypermetabolic activity within the liver, pancreas, adrenal glands, or spleen. No hypermetabolic lymph nodes in the abdomen or pelvis. Incidental CT findings: none SKELETON: No findings for osseous tumor. Incidental CT findings: none IMPRESSION: 1. Ill-defined hypermetabolic infiltrating soft tissue mass involving the left chest wall, mediastinum and right-sided neck nodes. Findings could be due to an atypical/aggressive lymphoma or sarcoma. 2. Soft tissue thickening and hypermetabolism at the base of the tongue. This appears fairly symmetric and may just  be prominent lymphoid tissue but direct visualization is suggested. 3. No significant findings involving the lungs, abdomen/pelvis or bony structures (other than the first left anterior rib). Electronically Signed   By: Marijo Sanes M.D.   On: 02/05/2021 10:14   CT VENOGRAM CHEST  Result Date: 01/19/2021 CLINICAL DATA:  Hemoptysis, supraclavicular fullness, dysphagia, possible cancer, evaluate SVC EXAM: CT VENOGRAM CHEST TECHNIQUE: Multidetector CT imaging of the chest was performed using the standard protocol during bolus administration of intravenous contrast. Multiplanar CT image reconstructions and MIPs were obtained to evaluate the vascular anatomy. CONTRAST:  132m OMNIPAQUE IOHEXOL 350 MG/ML SOLN COMPARISON:  08/19/2020 FINDINGS: Cardiovascular: Left arm contrast injection. There is stenosis of the central aspect of the left subclavian vein, and occlusion of the left IJ vein secondary to infiltrative enhancing process. Left innominate vein is patent. Right subclavian and IJ veins patent. SVC widely patent. Heart size normal. No pericardial effusion. Bovine variant aortic arch anatomy without  stenosis. Mild circumferential wall thickening in the proximal right brachiocephalic and left common carotid arteries, without stenosis. Mediastinum/Nodes: There is a poorly marginated enhancing infiltrative anterior mediastinal and anterior left upper lobe process centered posterior to the left sternoclavicular joint extending across the midline, resulting in narrowing of the left subclavian vein and occlusion of the left IJ vein centrally. There is pre-vascular adenopathy, largest node 1.7 cm short axis diameter. Lungs/Pleura: Tiny left pleural effusion. No pneumothorax. 2 cm masslike process in the anterior left upper lobe contiguous with infiltrating process in the anterior mediastinum as detailed above. There are adjacent ground-glass opacities and smaller satellite nodules in the anterior segment right upper lobe as well as thickening of regional intralobar septa. There is dependent atelectasis posteriorly at the left lung base. 2 mm calcified granuloma in the posterior right upper lobe (Im88,Se5) . 4 mm subpleural granuloma in the right middle lobe (Im23,Se7) . emphysematous changes in both upper lobes. Upper Abdomen: Stable coarse calcifications in the left adrenal gland. No acute findings. Musculoskeletal: Probable anterior chest wall involvement of the intercostal space by the infiltrative process in the anterior left upper lobe and anterior mediastinum described above. No definite osseous involvement. No fracture or worrisome bone lesion. Review of the MIP images confirms the above findings. IMPRESSION: 1. Anterior left upper lobe, mediastinal, and anterior chest wall process as detailed above may represent invasive neoplasm or atypical infectious process . 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 There is associated pre-vascular adenopathy. 2.  Emphysema (ICD10-J43.9). Electronically Signed   By: DLucrezia EuropeM.D.   On: 01/19/2021 09:55    Labs:  CBC: No  results for input(s): WBC, HGB, HCT, PLT in the last 8760 hours.  COAGS: No results for input(s): INR, APTT in the last 8760 hours.  BMP: Recent Labs    01/11/21 1421  NA 137  K 3.8  CL 96  CO2 25  GLUCOSE 114*  BUN 11  CALCIUM 8.8  CREATININE 0.93    LIVER FUNCTION TESTS: Recent Labs    12/07/20 1425  BILITOT 0.2  AST 17  ALT 12  ALKPHOS 122*  PROT 7.0  ALBUMIN 4.5    TUMOR MARKERS: No results for input(s): AFPTM, CEA, CA199, CHROMGRNA in the last 8760 hours.  Assessment and Plan:  Worsening hemoptysis Neck pain and fullness ++smoker +PET :   Ill-defined hypermetabolic infiltrating soft tissue mass involving the left chest wall, mediastinum and right-sided neck nodes. Findings could be due to an atypical/aggressive  lymphoma or sarcoma. Risks and benefits of right cervical lymph node biopsy vs left chest mass biopsy was discussed with the patient and/or patient's family including, but not limited to bleeding, infection, damage to adjacent structures or low yield requiring additional tests.  All of the questions were answered and there is agreement to proceed.  Consent signed and in chart.   Thank you for this interesting consult.  I greatly enjoyed meeting Katie Sherman and look forward to participating in their care.  A copy of this report was sent to the requesting provider on this date.  Electronically Signed: Lavonia Drafts, PA-C 02/15/2021, 12:41 PM   I spent a total of  30 Minutes   in face to face in clinical consultation, greater than 50% of which was counseling/coordinating care for right cervical lymph node bx vs left chest mass bx

## 2021-02-16 ENCOUNTER — Telehealth (HOSPITAL_COMMUNITY): Payer: Self-pay | Admitting: Licensed Clinical Social Worker

## 2021-02-16 ENCOUNTER — Ambulatory Visit (HOSPITAL_COMMUNITY): Payer: Self-pay | Admitting: Licensed Clinical Social Worker

## 2021-02-16 NOTE — Telephone Encounter (Signed)
Katie Sherman had an in person therapy appointment scheduled today at 4pm.  Clinician outreached Katie Sherman by phone at 4:05pm when she did not present on time.  Katie Sherman did not answer this call, so a voicemail was left reminding Katie Sherman of this appointment, and contact number was included for Katie Sherman to return call.  Clinician informed front desk of no-show event when Katie Sherman had not presented for session by 4:15pm or returned this phone call.    Shade Flood, LCSW, LCAS 02/16/21

## 2021-02-18 ENCOUNTER — Other Ambulatory Visit: Payer: Self-pay | Admitting: Internal Medicine

## 2021-02-18 ENCOUNTER — Encounter: Payer: Self-pay | Admitting: Internal Medicine

## 2021-02-18 ENCOUNTER — Telehealth: Payer: Self-pay | Admitting: Emergency Medicine

## 2021-02-18 ENCOUNTER — Other Ambulatory Visit: Payer: Self-pay

## 2021-02-18 MED ORDER — IBUPROFEN 600 MG PO TABS
600.0000 mg | ORAL_TABLET | Freq: Three times a day (TID) | ORAL | 1 refills | Status: DC | PRN
  Filled 2021-02-18 – 2021-03-04 (×2): qty 30, 10d supply, fill #0
  Filled 2021-05-22: qty 30, 10d supply, fill #1

## 2021-02-18 NOTE — Telephone Encounter (Signed)
LMTCB

## 2021-02-21 ENCOUNTER — Other Ambulatory Visit: Payer: Self-pay

## 2021-02-21 NOTE — Telephone Encounter (Signed)
Let her know that I have been watching for them to come back and they haven't been finalized yet. I will call Pathology today to see what is taking so long

## 2021-02-21 NOTE — Telephone Encounter (Signed)
Spoke with the patient, informed her that final stains are pending but Dr Melina Copa is concerned that the bx may be non-diagnostic. I will communicate w Dr Anselm Pancoast, see if the L chest mass is a possible target, either by him or by general surgery.

## 2021-02-21 NOTE — Telephone Encounter (Signed)
Pt is calling back again in regards to getting her biopsy results she stated that she has been waiting 6 days and if she does not hear something back after lunch she will come to the office and "raise hell".  Pls regard; [phone]

## 2021-02-21 NOTE — Telephone Encounter (Signed)
Called spoke with patient let her know Dr. Agustina Caroli response. She states people in our office have told her they would be back in 4 days. The hospital told her she would hear something by Friday of last week. Patient is just eagerly waiting for her results.  I let her know once we knew something Dr. Lamonte Sakai or the nursing staff would call her with the information.  Patient voiced understanding.

## 2021-02-21 NOTE — Telephone Encounter (Signed)
Pt calling back for results [phone]

## 2021-02-21 NOTE — Telephone Encounter (Signed)
Dr. Lamonte Sakai, Patient is wanting to know the results of her her biopsy.  She is anxiously awaiting the results.  She would like to get the results today.  Please advise.  Thank you.

## 2021-02-22 LAB — SURGICAL PATHOLOGY

## 2021-02-22 NOTE — Telephone Encounter (Signed)
Please let the pt know that I heard from Dr Anselm Pancoast in IR, and that he is going to work on setting a repeat bx of her L chest wall. She should hear from his office. The final stains on her lymph node bx are still pending. If we get an answer from that final bx report then we can cancel a repeat bx

## 2021-02-22 NOTE — Telephone Encounter (Signed)
Called and spoke with pt letting her know the info stated by Dr. Lamonte Sakai and she verbalized understanding.

## 2021-02-23 ENCOUNTER — Encounter (HOSPITAL_COMMUNITY): Payer: Self-pay | Admitting: Radiology

## 2021-02-23 ENCOUNTER — Other Ambulatory Visit: Payer: Self-pay

## 2021-02-23 DIAGNOSIS — R222 Localized swelling, mass and lump, trunk: Secondary | ICD-10-CM

## 2021-02-23 NOTE — Progress Notes (Signed)
Patient Name  Katie Sherman, Katie Sherman Legal Sex  Female DOB  1965-12-22 SSN  [id number] Address  [place] [address] Phone  [phone] [facility])  [phone] (Mobile)    Message Received: Jeralyn Ruths, Ginny Forth wanted me to fwd to schedulers, is anyone working with you on this now since Buckman left?    Thanks,  TF        Previous Messages   ----- Message -----  From: Markus Daft, MD  Sent: 02/22/2021   9:32 AM EDT  To: Anell Barr   Can we schedule this patient for another biopsy.  Will be CT guided biopsy of left chest lesion, near the anterior left first or second ribs.   Thanks,  Adam  ----- Message -----  From: Collene Gobble, MD  Sent: 02/21/2021   1:38 PM EDT  To: Markus Daft, MD   Cipriano Mile I talked to path today. It looks like the R cervical node bx is going to be non-diagnostic (final stains pending). I think we will need to repeat bx, possibly on the L chest mass. Do you think we can set up?   Rob   ----- Message -----  From: Markus Daft, MD  Sent: 02/10/2021   7:46 AM EDT  To: Collene Gobble, MD   Got request for biopsy.  Not sure who you talked to but I will set up for right cervical node biopsy if ok.  Left chest mass is ill-defined.   Quita Skye

## 2021-02-23 NOTE — Telephone Encounter (Signed)
Hello Dr. Lamonte Sakai, please advise on mychart message below, thanks!  When will I have the next biopsy? I feel like time is being wasted now.    [name] [facility] (supporting Lamonte Sakai, Rose Fillers, MD) 16 hours ago (7:20 PM)   I got the results in My Chart... what does that mean?

## 2021-02-23 NOTE — Telephone Encounter (Signed)
I've spoken to Dr Anselm Pancoast and placed order for the biopsy. Received a message today that IR is getting it scheduled, so she should be called soon.

## 2021-02-24 ENCOUNTER — Ambulatory Visit (INDEPENDENT_AMBULATORY_CARE_PROVIDER_SITE_OTHER): Payer: Self-pay | Admitting: Licensed Clinical Social Worker

## 2021-02-24 ENCOUNTER — Other Ambulatory Visit: Payer: Self-pay

## 2021-02-24 DIAGNOSIS — F330 Major depressive disorder, recurrent, mild: Secondary | ICD-10-CM

## 2021-02-24 DIAGNOSIS — F411 Generalized anxiety disorder: Secondary | ICD-10-CM

## 2021-02-24 DIAGNOSIS — F4310 Post-traumatic stress disorder, unspecified: Secondary | ICD-10-CM

## 2021-02-24 NOTE — Progress Notes (Signed)
THERAPIST PROGRESS NOTE   Session Time: 2:00pm - 3:00pm   Location: Patient: [facility] Provider: [facility]   Participation Level: Active   Behavioral Response: Alert, neatly groomed, casually dressed, anxious affect/mood   Type of Therapy:  Individual Therapy   Treatment Goals addressed: Depression and Anxiety management; Medication management; Keeping appointments with doctors    Interventions: CBT: core beliefs   Summary: Tyia Binford is a 55 year old divorced Caucasian female that presented for therapy appointment and is diagnosed with Generalized Anxiety Disorder; Major Depressive Disorder, recurrent, mild; and PTSD.        Suicidal/Homicidal: None; without intent or plan   Therapist Response: Clinician met with Raschelle for in person therapy session and assessed for safety, sobriety, and medication compliance.  Fayetta presented for today's appointment on time and was alert, oriented x5, with no evidence or self-report of active SI/HI or A/V H.  Calandra reported ongoing compliance with medication and denied any use of alcohol or illicit drugs.  Clinician inquired about whether Shanyia has had any significant changes in thoughts, feelings, or behavior since completion of assessment.  Shannelle reported scores of 0/10 for depression, 5/10 for anxiety, 0/10 for anger.  Deloise denied experiencing any panic attacks or outbursts.  Tanai reported that she did restart Lamictal following discussion with nurse she met with after clinical assessment was completed 2 weeks ago, and stated "I think its making a big difference with my anxiety and depression".  She reported that she has been keeping medical appointments to address progression of her tumor, but has found herself ruminating at times about worst case scenarios for her future.  Clinician utilized a handout on core beliefs with [employer] to guide discussion.  This handout defined core beliefs as the most central ideas one  holds about themselves, which can also influence automatic negative thoughts and cognitive distortions.  Clinician utilized CBT concept to explain how this thinking affects feelings and behavior in turn, and examples of common negative core beliefs were listed, such as "I am trapped", "I am a failure", and "Nothing is going to get better for me".  Consequences of allowing core beliefs to persist were listed as well, including inability to trust others, aggressive behavior, unhealthy boundaries, and worsening anxiety, depression, or reliance upon drugs/alcohol to cope.  Clinician tasked [name] with identifying some of her negative core beliefs, and offered strategies for countering negative thoughts in order to resolve them, such as reciting daily positive affirmations, or looking for evidence that contradicts these beliefs.  Interventions were effective, as evidenced by [name] acknowledging that when she ruminates too much about negative aspects of her life such as health, she tends to feel more anxious, depressed, and angry, which reinforces core beliefs that "Things won't get better", and "I am trapped".  Rickey reported that following this discussion, she was able to highlight evidence which gives her hope for the future, including making plans to see family at the beach this weekend, having a nonprofit agree to cover rent and bills for next 2 months, and deciding to make a bucket list of plans for the future, such as attending a sports game where her favorite athlete will be playing.  Destynee also expressed interest in practicing positive affirmations, such as "I am going to fight this with everything I can".  Nela stated "I will work on thinking about more ways to be positive".  Clinician will continue to monitor.         Plan:  Meet again in 1 week.   Diagnosis: Generalized Anxiety Disorder; Major Depressive Disorder, recurrent, mild; and PTSD.   Shade Flood, LCSW, LCAS 02/24/21

## 2021-02-28 ENCOUNTER — Other Ambulatory Visit: Payer: Self-pay

## 2021-02-28 MED ORDER — OXYCODONE-ACETAMINOPHEN 10-325 MG PO TABS: 1.0000 | ORAL_TABLET | Freq: Three times a day (TID) | ORAL | 0 refills | Status: DC | PRN

## 2021-03-01 ENCOUNTER — Other Ambulatory Visit: Payer: Self-pay

## 2021-03-01 ENCOUNTER — Ambulatory Visit (INDEPENDENT_AMBULATORY_CARE_PROVIDER_SITE_OTHER): Payer: Self-pay | Admitting: Licensed Clinical Social Worker

## 2021-03-01 DIAGNOSIS — F4310 Post-traumatic stress disorder, unspecified: Secondary | ICD-10-CM

## 2021-03-01 DIAGNOSIS — F411 Generalized anxiety disorder: Secondary | ICD-10-CM

## 2021-03-01 DIAGNOSIS — F330 Major depressive disorder, recurrent, mild: Secondary | ICD-10-CM

## 2021-03-01 NOTE — Progress Notes (Signed)
THERAPIST PROGRESS NOTE   Session Time: 1:00pm - 2:00pm   Location: Patient: [facility] Provider: [facility]   Participation Level: Active   Behavioral Response: Alert, casually dressed, anxious affect/mood   Type of Therapy:  Individual Therapy   Treatment Goals addressed: Depression and Anxiety management; Medication management   Interventions: CBT, problem solving, communication skills     Summary: Katie Sherman is a 55 year old divorced Caucasian female that presented for therapy appointment and is diagnosed with Generalized Anxiety Disorder; Major Depressive Disorder, recurrent, mild; and PTSD.        Suicidal/Homicidal: None; without intent or plan   Therapist Response: Clinician met with Katie Sherman for in person therapy appointment and assessed for safety, sobriety, and medication compliance.  Katie Sherman presented for today's session on time and was alert, oriented x5, with no evidence or self-report of active SI/HI or A/V H.  Katie Sherman reported that Katie Sherman continues taking medication as prescribed and denied any use of alcohol or illicit drugs.  Clinician inquired about Katie Sherman's current emotional ratings today, as well as any significant changes in thoughts, feelings, or behavior since last check-in.  Katie Sherman reported scores of 0/10 for depression, 5/10 for anxiety, 0/10 for anger.  Katie Sherman denied experiencing any panic attacks or outbursts.  Katie Sherman reported that for self-care Katie Sherman went to the beach last Friday evening with a friend, but was upset when her daughter did not visit her with the grandchildren until Katie Sherman was about to leave. Katie Sherman stated "Katie Sherman came at the last minute and it was only for 10 minutes".  Katie Sherman reported that an additional stressor was her daughter asking whether Katie Sherman and the grandchildren could move in with Katie Sherman at some point soon, stating "I'm worried about where my anxiety and stress levels will be if Katie Sherman moves back in".  Clinician assisted Katie Sherman in  running cost benefit analysis regarding decision to allow her daughter and grandchildren to move in with or based upon how this will affect mental health and overall wellbeing.  Katie Sherman participated in analysis and reported that the downsides of agreeing to this outweigh the positives, including the potential to be triggered by her daughter's mental health, the children's erratic behavior, increased utility cost, and adding more overall stress into her daily life when Katie Sherman should be focusing on medical issues. Katie Sherman reported that despite this, Katie Sherman often finds it difficult to tell her daughter 'No', stating "I'm kinda stuck between whether or not to say no.  I want her to come back so I can spend time with them, but I do have a lot on my plate right now".  Clinician encouraged Katie Sherman to prioritize her personal needs at this time, and discussed topic of assertive communication skills with her to assist in establishing healthier boundaries should Katie Sherman allow them to move in with her.  This included helpful tips such as utilizing "I" statements to express her feelings clearly while avoiding blame, keeping conversations present focused on one issue at a time, and practicing good non verbal skills such as maintaining good eye contact, appropriate tone/volume, and avoiding triggering behaviors such as rolling eyes.  Intervention was effective, as evidenced by Katie Sherman reporting that it was helpful to complete analysis, discuss these communication skills and receive suggestions, noting that it would be wise to set ground rules with daughter beforehand if Katie Sherman decides to let her move in.  Katie Sherman stated "If Katie Sherman moves in with me there is definitely going to be stress.  Katie Sherman doesn't want  to let things go and likes to argue a lot.  I will try to talk to her in a reasonable, calm manner and this made me think more about how important it will be to sit down and talk about boundaries with her first".  Clinician will continue to  monitor.         Plan: Meet again in 1 week.   Diagnosis: Generalized Anxiety Disorder; Major Depressive Disorder, recurrent, mild; and PTSD.   Shade Flood, LCSW, LCAS 03/01/21

## 2021-03-02 ENCOUNTER — Other Ambulatory Visit: Payer: Self-pay | Admitting: Radiology

## 2021-03-02 ENCOUNTER — Other Ambulatory Visit: Payer: Self-pay | Admitting: Student

## 2021-03-03 ENCOUNTER — Other Ambulatory Visit: Payer: Self-pay

## 2021-03-03 ENCOUNTER — Ambulatory Visit (HOSPITAL_COMMUNITY)
Admission: RE | Admit: 2021-03-03 | Discharge: 2021-03-03 | Disposition: A | Discharge status: Home / Self Care | Payer: Self-pay | Source: Ambulatory Visit | Attending: Emergency Medicine | Admitting: Emergency Medicine

## 2021-03-03 DIAGNOSIS — R222 Localized swelling, mass and lump, trunk: Secondary | ICD-10-CM | POA: Insufficient documentation

## 2021-03-03 LAB — CBC
HCT: 39.6 % (ref 36.0–46.0)
Hemoglobin: 13.1 g/dL (ref 12.0–15.0)
MCH: 31.2 pg (ref 26.0–34.0)
MCHC: 33.1 g/dL (ref 30.0–36.0)
MCV: 94.3 fL (ref 80.0–100.0)
Platelets: 339 10*3/uL (ref 150–400)
RBC: 4.2 MIL/uL (ref 3.87–5.11)
RDW: 14.4 % (ref 11.5–15.5)
WBC: 9.4 10*3/uL (ref 4.0–10.5)
nRBC: 0 % (ref 0.0–0.2)

## 2021-03-03 LAB — PROTIME-INR
INR: 1 (ref 0.8–1.2)
Prothrombin Time: 13 seconds (ref 11.4–15.2)

## 2021-03-03 MED ORDER — FENTANYL CITRATE (PF) 100 MCG/2ML IJ SOLN
INTRAMUSCULAR | Status: AC | PRN
  Administered 2021-03-03 (×3): 25 ug via INTRAVENOUS

## 2021-03-03 MED ORDER — FENTANYL CITRATE (PF) 100 MCG/2ML IJ SOLN
INTRAMUSCULAR | Status: AC
  Filled 2021-03-03: qty 2

## 2021-03-03 MED ORDER — MIDAZOLAM HCL 2 MG/2ML IJ SOLN
INTRAMUSCULAR | Status: AC
  Filled 2021-03-03: qty 4

## 2021-03-03 MED ORDER — MIDAZOLAM HCL 2 MG/2ML IJ SOLN
INTRAMUSCULAR | Status: AC | PRN
  Administered 2021-03-03 (×3): 1 mg via INTRAVENOUS

## 2021-03-03 MED ORDER — LIDOCAINE HCL 1 % IJ SOLN
INTRAMUSCULAR | Status: AC
  Filled 2021-03-03: qty 10

## 2021-03-03 MED ORDER — SODIUM CHLORIDE 0.9 % IV SOLN: INTRAVENOUS | Status: DC

## 2021-03-03 NOTE — H&P (Signed)
Chief Complaint: Patient was seen in consultation today for left chest lesion biopsy  Referring Physician(s): Byrum,Robert S  Supervising Physician: Dr. Denna Haggard  Patient Status: [facility] - Out-pt  History of Present Illness: Katie Sherman is a 55 y.o. female with a medical history significant for bipolar disorder, tobacco use and cervical cancer. She presented to [facility] ED 01/01/21 with complaints of neck swelling and tenderness along with pain when swallowing. She was discharged home for suspected musculoskeletal injury but was later referred to pulmonology on 01/11/21 when she developed hemoptysis in addition to worsening neck pain. Imaging obtained showed findings concerning for malignancy.   NM PET 02/03/21 IMPRESSION: 1. Ill-defined hypermetabolic infiltrating soft tissue mass involving the left chest wall, mediastinum and right-sided neck nodes. Findings could be due to an atypical/aggressive lymphoma or sarcoma. 2. Soft tissue thickening and hypermetabolism at the base of the tongue. This appears fairly symmetric and may just be prominent lymphoid tissue but direct visualization is suggested. 3. No significant findings involving the lungs, abdomen/pelvis or bony structures (other than the first left anterior rib).  She presented to IR 02/15/21 for an image-guided right cervical lymph node biopsy with pathology report stating "non-specific reactive lymphoid hyperplasia." Dr. Lamonte Sakai consulted IR for possible biopsy of the left chest wall mass. This case was reviewed and procedure approved by Dr. Anselm Pancoast.   Past Medical History:  Diagnosis Date   Bilateral carpal tunnel syndrome 03/19/2018   Cervical cancer ([place])    Cervical cancer ([place])    History of exercise stress test    ETT-Echo 5/18: normal EF, no ischemia   History of loop recorder    currently on person   Kidney stone     Past Surgical History:  Procedure Laterality Date   CERVICAL CONE BIOPSY     TUBAL LIGATION       Allergies: Patient has no known allergies.  Medications: Prior to Admission medications   Medication Sig Start Date End Date Taking? Authorizing Provider  acyclovir (ZOVIRAX) 400 MG tablet Take 1 tablet (400 mg total) by mouth 3 (three) times daily. Patient not taking: Reported on 02/14/2021 01/11/21   Ladell Pier, MD  albuterol (VENTOLIN HFA) 108 (90 Base) MCG/ACT inhaler Inhale 2 puffs into the lungs every 6 (six) hours as needed for wheezing or shortness of breath. 08/07/20   Ladell Pier, MD  cyclobenzaprine (FLEXERIL) 10 MG tablet TAKE 1 TABLET (10 MG TOTAL) BY MOUTH DAILY AS NEEDED FOR MUSCLE SPASMS. 01/19/21 01/19/22  Elsie Stain, MD  ibuprofen (ADVIL) 600 MG tablet Take 1 tablet (600 mg total) by mouth every 8 (eight) hours as needed for moderate pain or headache. 02/18/21   Ladell Pier, MD  lamoTRIgine (LAMICTAL) 100 MG tablet TAKE 3 TABLETS (300 MG TOTAL) BY MOUTH DAILY. Patient taking differently: Take 100 mg by mouth daily. 02/01/21 02/01/22  Arfeen, Arlyce Harman, MD  meloxicam (MOBIC) 7.5 MG tablet Take 1 tablet (7.5 mg total) by mouth daily. Patient not taking: Reported on 02/14/2021 01/04/21   Charlott Rakes, MD  omeprazole (PRILOSEC) 20 MG capsule Take 1 capsule (20 mg total) by mouth daily. 05/05/20   Ladell Pier, MD  oxyCODONE-acetaminophen (PERCOCET) 10-325 MG tablet Take 1 tablet by mouth every 8 (eight) hours as needed for up to 14 days for pain. 02/28/21 03/14/21  Elsie Stain, MD  pravastatin (PRAVACHOL) 20 MG tablet Take 1 tablet (20 mg total) by mouth daily. Patient not taking: Reported on 02/14/2021 11/12/20  Ladell Pier, MD     Family History  Problem Relation Age of Onset   Heart attack Mother 58   Heart disease Father    Heart attack Father 71    Social History   Socioeconomic History   Marital status: Widowed    Spouse name: Not on file   Number of children: Not on file   Years of education: Not on file   Highest education  level: Not on file  Occupational History   Occupation: unemployed  Tobacco Use   Smoking status: Every Day    Packs/day: 1.00    Years: 36.00    Pack years: 36.00    Types: Cigarettes    Start date: 1983   Smokeless tobacco: Never   Tobacco comments:    Smokes 1 pack a day ARJ 01/20/21  Vaping Use   Vaping Use: Never used  Substance and Sexual Activity   Alcohol use: No   Drug use: No    Types: Marijuana    Comment: stopped 04/06/2017   Sexual activity: Not Currently    Birth control/protection: None  Other Topics Concern   Not on file  Social History Narrative   Prior traffic control specialist - unemployed now   Widow   5 children, 10 grandchildren   Moved here from [place] 2013   Social Determinants of Health   Financial Resource Strain: Not on file  Food Insecurity: Not on file  Transportation Needs: Not on file  Physical Activity: Not on file  Stress: Not on file  Social Connections: Not on file    Review of Systems: A 12 point ROS discussed and pertinent positives are indicated in the HPI above.  All other systems are negative.  Review of Systems  Constitutional:  Negative for appetite change and fatigue.  Respiratory:  Negative for cough and shortness of breath.   Cardiovascular:  Negative for chest pain and leg swelling.  Gastrointestinal:  Negative for abdominal pain, diarrhea, nausea and vomiting.  Musculoskeletal:  Positive for myalgias and neck pain.       Neck pain, left chest wall and shoulder pain.   Neurological:  Negative for headaches.   Vital Signs: BP 134/77 (BP Location: Right Arm)   Pulse 87   Temp 97.9 F (36.6 C) (Oral)   Resp 18   Ht '5\' 7"'$  (1.702 m)   Wt 157 lb (71.2 kg)   LMP 04/29/2012   SpO2 95%   BMI 24.59 kg/m   Physical Exam Constitutional:      General: She is not in acute distress.    Appearance: She is not ill-appearing.  HENT:     Mouth/Throat:     Mouth: Mucous membranes are moist.     Pharynx: Oropharynx is  clear.  Cardiovascular:     Rate and Rhythm: Normal rate and regular rhythm.  Pulmonary:     Effort: Pulmonary effort is normal.     Breath sounds: Normal breath sounds.  Abdominal:     General: Bowel sounds are normal.     Palpations: Abdomen is soft.     Tenderness: There is no abdominal tenderness.  Musculoskeletal:     Right lower leg: No edema.     Left lower leg: No edema.  Skin:    General: Skin is warm and dry.  Neurological:     Mental Status: She is alert and oriented to person, place, and time.    Imaging: NM PET Image Initial (PI) Skull Base To  Thigh  Result Date: 02/05/2021 CLINICAL DATA:  Initial treatment strategy for mediastinal mass. EXAM: NUCLEAR MEDICINE PET SKULL BASE TO THIGH TECHNIQUE: 8.41 mCi F-18 FDG was injected intravenously. Full-ring PET imaging was performed from the skull base to thigh after the radiotracer. CT data was obtained and used for attenuation correction and anatomic localization. Fasting blood glucose: 111 mg/dl COMPARISON:  Neck and chest CT 01/18/2021 FINDINGS: Mediastinal blood pool activity: SUV max 2.56 Liver activity: SUV max 3.24 NECK: Soft tissue thickening and hypermetabolism noted at the base of the. SUV max is 7.72. This could be prominent lymphoid tissue at the base of the tongue or a tongue base mass. Recommend correlation with direct visualization. There are mildly hypermetabolic right level 2 lymph nodes with SUV max of 4.18. There is also a enlarged right level 4 lymph node measuring approximately 13 mm. SUV max is 8.84. Incidental CT findings: none CHEST: Extensive subclavicular soft tissue density as demonstrated on the prior CT scans mainly between the head of the clavicle and the first left rib. SUV max is 13 point thick thick in there appears to be partial destruction of the anterior first left rib. No obvious involvement of the clavicle. Adjacent anterior mediastinal soft tissue density with SUV max of 9.69. There is also tumor in  the left chest between the left first and second ribs invading the left pectoralis muscle. SUV max is 11.37. 11.5 mm prevascular node has an SUV max of 392. No obvious pulmonary lesions or pulmonary nodules. Mild diffuse uptake involving the distal esophagus without mass. This is likely due to esophagitis. Incidental CT findings: Emphysematous changes and small hiatal hernia. ABDOMEN/PELVIS: No abnormal hypermetabolic activity within the liver, pancreas, adrenal glands, or spleen. No hypermetabolic lymph nodes in the abdomen or pelvis. Incidental CT findings: none SKELETON: No findings for osseous tumor. Incidental CT findings: none IMPRESSION: 1. Ill-defined hypermetabolic infiltrating soft tissue mass involving the left chest wall, mediastinum and right-sided neck nodes. Findings could be due to an atypical/aggressive lymphoma or sarcoma. 2. Soft tissue thickening and hypermetabolism at the base of the tongue. This appears fairly symmetric and may just be prominent lymphoid tissue but direct visualization is suggested. 3. No significant findings involving the lungs, abdomen/pelvis or bony structures (other than the first left anterior rib). Electronically Signed   By: Marijo Sanes M.D.   On: 02/05/2021 10:14   [place] CORE BIOPSY (LYMPH NODES)  Result Date: 02/15/2021 INDICATION: 55 year old female with a history of left-sided chest mass and lymphadenopathy, FDG avid EXAM: ULTRASOUND-GUIDED RIGHT CERVICAL NODE BIOPSY MEDICATIONS: None. ANESTHESIA/SEDATION: Moderate (conscious) sedation was employed during this procedure. A total of Versed 1.5 mg and Fentanyl 50 mcg was administered intravenously. Moderate Sedation Time: 10 minutes. The patient's level of consciousness and vital signs were monitored continuously by radiology nursing throughout the procedure under my direct supervision. FLUOROSCOPY TIME:  Ultrasound COMPLICATIONS: None PROCEDURE: Informed written consent was obtained from the patient after a thorough  discussion of the procedural risks, benefits and alternatives. All questions were addressed. Maximal Sterile Barrier Technique was utilized including caps, mask, sterile gowns, sterile gloves, sterile drape, hand hygiene and skin antiseptic. A timeout was performed prior to the initiation of the procedure. Ultrasound survey was performed with images stored and sent to PACs. The right neck was prepped with chlorhexidine in a sterile fashion, and a sterile drape was applied covering the operative field. A sterile gown and sterile gloves were used for the procedure. Local anesthesia was provided with 1% Lidocaine.  Ultrasound guidance was used to infiltrate the region with 1% lidocaine for local anesthesia. Four separate 18 gauge core needle biopsy were then acquired of the right cervical lymph nodes/ nodule using ultrasound guidance. Images were stored. Placed into saline. Final image was stored after biopsy. Patient tolerated the procedure well and remained hemodynamically stable throughout. No complications were encountered and no significant blood loss was encounter IMPRESSION: Status post ultrasound-guided biopsy of pathologic right cervical lymph node. Signed, Dulcy Fanny. Dellia Nims, RPVI [facility] Radiology Electronically Signed   By: Corrie Mckusick D.O.   On: 02/15/2021 16:06    Labs:  CBC: Recent Labs    02/15/21 1239 03/03/21 1005  WBC 6.8 9.4  HGB 11.3* 13.1  HCT 35.2* 39.6  PLT 341 339    COAGS: Recent Labs    02/15/21 1239 03/03/21 1005  INR 1.0 1.0    BMP: Recent Labs    01/11/21 1421  NA 137  K 3.8  CL 96  CO2 25  GLUCOSE 114*  BUN 11  CALCIUM 8.8  CREATININE 0.93    LIVER FUNCTION TESTS: Recent Labs    12/07/20 1425  BILITOT 0.2  AST 17  ALT 12  ALKPHOS 122*  PROT 7.0  ALBUMIN 4.5    TUMOR MARKERS: No results for input(s): AFPTM, CEA, CA199, CHROMGRNA in the last 8760 hours.  Assessment and Plan:  Left  chest wall mass: Katie Sherman, 55 year old female presents today to the [facility] Interventional Radiology department for an image-guided left chest wall mass following a non-diagnostic lymph node biopsy on 02/15/21.  Risks and benefits of this procedure were discussed with the patient and/or patient's family including, but not limited to bleeding, infection, damage to adjacent structures or low yield requiring additional tests.  All of the questions were answered and there is agreement to proceed.  Consent signed and in chart.  Thank you for this interesting consult.  I greatly enjoyed meeting Katie Sherman and look forward to participating in their care.  A copy of this report was sent to the requesting provider on this date.  Electronically Signed: Soyla Dryer, AGACNP-BC [phone] 03/03/2021, 11:11 AM   I spent a total of  30 Minutes   in face to face in clinical consultation, greater than 50% of which was counseling/coordinating care for left chest wall mass biopsy

## 2021-03-03 NOTE — Procedures (Signed)
Interventional Radiology Procedure Note  Date of Procedure: 03/03/2021  Procedure: Left chest wall mass biopsy   Findings:  1. Successful core needle biopsy of left chest wall mass    Complications: No immediate complications noted.   Estimated Blood Loss: minimal  Follow-up and Recommendations: 1. Routine    Albin Felling, MD  Vascular & Interventional Radiology  03/03/2021 12:53 PM

## 2021-03-03 NOTE — Sedation Documentation (Signed)
Patient reporting pain, medication given.

## 2021-03-03 NOTE — Sedation Documentation (Signed)
Patient is resting comfortably. 

## 2021-03-04 ENCOUNTER — Other Ambulatory Visit: Payer: Self-pay

## 2021-03-04 ENCOUNTER — Telehealth: Payer: Self-pay | Admitting: Emergency Medicine

## 2021-03-04 ENCOUNTER — Encounter (HOSPITAL_COMMUNITY): Payer: Self-pay | Admitting: Psychiatry

## 2021-03-04 ENCOUNTER — Telehealth (INDEPENDENT_AMBULATORY_CARE_PROVIDER_SITE_OTHER): Payer: Self-pay | Admitting: Psychiatry

## 2021-03-04 VITALS — Wt 157.0 lb

## 2021-03-04 DIAGNOSIS — F319 Bipolar disorder, unspecified: Secondary | ICD-10-CM

## 2021-03-04 DIAGNOSIS — F4310 Post-traumatic stress disorder, unspecified: Secondary | ICD-10-CM

## 2021-03-04 MED ORDER — LAMOTRIGINE 100 MG PO TABS
ORAL_TABLET | ORAL | 1 refills | Status: DC
  Filled 2021-03-04: qty 90, 30d supply, fill #0

## 2021-03-04 MED ORDER — GABAPENTIN 400 MG PO CAPS
400.0000 mg | ORAL_CAPSULE | Freq: Two times a day (BID) | ORAL | 1 refills | Status: DC
  Filled 2021-03-04: qty 60, 30d supply, fill #0
  Filled 2021-04-11: qty 60, 30d supply, fill #1

## 2021-03-04 MED ORDER — QUETIAPINE FUMARATE 200 MG PO TABS
200.0000 mg | ORAL_TABLET | Freq: Every day | ORAL | 1 refills | Status: DC
  Filled 2021-03-04: qty 30, 30d supply, fill #0

## 2021-03-04 NOTE — Telephone Encounter (Signed)
REVIEW PATH RESULTS, NEEDLE BX
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
## 2021-03-07 DIAGNOSIS — R222 Localized swelling, mass and lump, trunk: Secondary | ICD-10-CM

## 2021-03-07 NOTE — Telephone Encounter (Signed)
Patient would like results of CT Biopsy. Patient phone number is [phone].

## 2021-03-07 NOTE — Telephone Encounter (Signed)
Called spoke with patient  She is anxious for the results  Would like Dr. Lamonte Sakai to call her with results.  Dr. Lamonte Sakai have you viewed these results for patient?

## 2021-03-07 NOTE — Telephone Encounter (Signed)
I called [facility] and spoke with Path Dept- transferred to [facility] in the path dept at [facility]  She advised that Dr Tammi Klippel currently reviewing the results and will be final tomorrow after she discusses case with another pathologist  Email sent to pt to keep her informed

## 2021-03-07 NOTE — Telephone Encounter (Signed)
Patient is aware of below message and voiced her understanding.  She is upset that results are still pending, as this is her second bx.  Routing back to Dr. Lamonte Sakai to ensure f/u.

## 2021-03-07 NOTE — Telephone Encounter (Signed)
Still pending as of this am. Will continue to follow

## 2021-03-07 NOTE — Telephone Encounter (Signed)
Please let her know that the path is still pending.

## 2021-03-08 ENCOUNTER — Other Ambulatory Visit: Payer: Self-pay

## 2021-03-08 ENCOUNTER — Telehealth: Payer: Self-pay | Admitting: Hematology and Oncology

## 2021-03-08 ENCOUNTER — Telehealth (HOSPITAL_COMMUNITY): Payer: Self-pay | Admitting: Licensed Clinical Social Worker

## 2021-03-08 ENCOUNTER — Ambulatory Visit (HOSPITAL_COMMUNITY): Payer: Self-pay | Admitting: Licensed Clinical Social Worker

## 2021-03-08 NOTE — Telephone Encounter (Signed)
I am checking frequently through the day for the pathology to be resulted. Unfortunately I cannot make the tests that are being performed go any faster. The Path Dept knows that we want them expedited. I will contact her as soon as I know.   In the meantime I want to go ahead and make referral to the cancer center so we have a visit in place to discuss treatment plan. I will place that referral now.

## 2021-03-08 NOTE — Telephone Encounter (Signed)
I reviewed the prelim path results w Dr Tresa Moore.  Surprisingly still do not have any definitive evidence for malignancy.  There is inflammatory change, some rib and cartilage destruction.  A few specialized stains are still pending that may reveal sarcoma, spindle cell process.  Given this data and the lymph node biopsy that has already been performed, need to consider other possible causes including some sort of infiltrative infectious process.  May need to send sample for culture.  Also considering possible referral to general surgery for a larger resection. I did already refer her to [facility] and I think is reasonable for her to keep this appointment at least for now as we are still unsure what the chest wall process is.  I discussed this with Katie Sherman by phone today

## 2021-03-08 NOTE — Telephone Encounter (Signed)
Noted.  This has been addressed.   Nothing further needed at this time.

## 2021-03-08 NOTE — Telephone Encounter (Signed)
I've been updating her. Please refer also to [employer].  Have reviewed her case with Dr Gershon Crane, considering referral for possible resection biopsy and cultures.

## 2021-03-08 NOTE — Telephone Encounter (Signed)
Scheduled appt per 8/30 referral. Pt is aware of appt date and time.

## 2021-03-08 NOTE — Telephone Encounter (Signed)
Katie Sherman had an in person therapy appointment schedule today for 3pm.  Clinician outreached her by phone at 3:10pm when she had not presented on time, but received no answer, and left a voicemail reminding her of appointment.  Clinician also included callback number for office in this voicemail.  Clinician informed front desk staff of no show event when [name] had not returned call or presented to office by 3:15pm.    Shade Flood, LCSW, LCAS 03/08/21

## 2021-03-08 NOTE — Telephone Encounter (Signed)
I reviewed the patient's CT chest and PET w Dr Gershon Crane with [facility]. He agrees that she might need an excisional bx. Will work on that referral if we aren't getting an answer with current bx

## 2021-03-10 ENCOUNTER — Other Ambulatory Visit: Payer: Self-pay | Admitting: *Deleted

## 2021-03-10 NOTE — Progress Notes (Signed)
The proposed treatment discussed in cancer conference is for discussion purpose only and is not a binding recommendation. The patient was not physically examined nor present for their treatment options. Therefore, final treatment plans cannot be decided.  ?

## 2021-03-11 LAB — SURGICAL PATHOLOGY

## 2021-03-11 NOTE — Telephone Encounter (Signed)
Please let the patient know that we discussed her case in Thoracic Conference yesterday. Reviewed her pathology, some of which is still pending but which hasn;t given a definite answer yet. We agreed that she needs a surgical biopsy for a better sample and to send for cultures. It looks like she has an appointment with Dr Kipp Brood next week to get this done.

## 2021-03-11 NOTE — Telephone Encounter (Signed)
I called and went over Dr. Lamonte Sakai recs with patient, patient verbalized understanding and has spoken with Dr. Kipp Brood. Nothing further needed.

## 2021-03-11 NOTE — Telephone Encounter (Signed)
Consults arranged with Oncology and also Dr Kipp Brood with [facility] to work on excisional biopsy for pathology and cultures.

## 2021-03-14 ENCOUNTER — Other Ambulatory Visit: Payer: Self-pay

## 2021-03-14 MED ORDER — OXYCODONE-ACETAMINOPHEN 10-325 MG PO TABS: 1.0000 | ORAL_TABLET | Freq: Three times a day (TID) | ORAL | 0 refills | Status: DC | PRN

## 2021-03-15 ENCOUNTER — Other Ambulatory Visit: Payer: Self-pay

## 2021-03-16 NOTE — Progress Notes (Signed)
[address]       [place] [address]             [phone]                    Carra A Beidleman Bartholomew Medical Record [id number] Date of Birth: 05/16/1966  Referring: Collene Gobble, MD Primary Care: Ladell Pier, MD Primary Cardiologist: None  Chief Complaint:    Chief Complaint  Patient presents with   chest wall mass    Surgical consult, CT Neck and Chest veogram 01/18/21, PET Scan 02/03/21, CT BX 03/03/21     History of Present Illness:    Katie Sherman 55 y.o. female referred for surgical evaluation of right sternoclavicular joint pain.  The patient states that Katie Sherman noticed some pain along with some swollen cervical lymph nodes in July of this year.  Katie Sherman is undergone a biopsy of the lymph nodes as well as the sternoclavicular joint without yield of any malignant cells.  There is a concern that this could be a malignancy given that a PET scan showed avidity at the area.  Katie Sherman does not have a history of any IV drug use but is a current pack-a-day smoker.  Katie Sherman denies any weight loss.  Katie Sherman only complains of left shoulder pain.  This has persisted for several months.    Past Medical History:  Diagnosis Date   Bilateral carpal tunnel syndrome 03/19/2018   Cervical cancer ([place])    Cervical cancer ([place])    History of exercise stress test    ETT-Echo 5/18: normal EF, no ischemia   History of loop recorder    currently on person   Kidney stone     Past Surgical History:  Procedure Laterality Date   CERVICAL CONE BIOPSY     TUBAL LIGATION      Family History  Problem Relation Age of Onset   Heart attack Mother 75   Heart disease Father    Heart attack Father 37   Liver cancer Father    Bone cancer Other    Breast cancer Maternal Aunt        Diagnosed in 51's   Bone cancer Maternal Aunt      Social History   Tobacco Use  Smoking Status Every Day   Packs/day: 1.00   Years: 36.00   Pack years: 36.00   Types: Cigarettes   Start  date: 1983  Smokeless Tobacco Never  Tobacco Comments   Smokes 1 pack a day ARJ 01/20/21    Social History   Substance and Sexual Activity  Alcohol Use No     No Known Allergies  Current Outpatient Medications  Medication Sig Dispense Refill   albuterol (VENTOLIN HFA) 108 (90 Base) MCG/ACT inhaler Inhale 2 puffs into the lungs every 6 (six) hours as needed for wheezing or shortness of breath. 8 g 2   cyclobenzaprine (FLEXERIL) 10 MG tablet TAKE 1 TABLET (10 MG TOTAL) BY MOUTH DAILY AS NEEDED FOR MUSCLE SPASMS. 90 tablet 2   gabapentin (NEURONTIN) 400 MG capsule Take 1 capsule (400 mg total) by mouth 2 (two) times daily. 60 capsule 1   ibuprofen (ADVIL) 600 MG tablet Take 1 tablet (600 mg total) by mouth every 8 (eight) hours as needed for moderate pain or headache. 30 tablet 1   lamoTRIgine (LAMICTAL) 100 MG tablet TAKE 3 TABLETS (300 MG TOTAL) BY MOUTH DAILY. 90 tablet 1   omeprazole (  PRILOSEC) 20 MG capsule Take 1 capsule (20 mg total) by mouth daily. 90 capsule 3   oxyCODONE-acetaminophen (PERCOCET) 10-325 MG tablet Take 1 tablet by mouth every 8 (eight) hours as needed for up to 14 days for pain. 45 tablet 0   pravastatin (PRAVACHOL) 20 MG tablet Take 1 tablet (20 mg total) by mouth daily. 30 tablet 3   QUEtiapine (SEROQUEL) 200 MG tablet Take 1 tablet (200 mg total) by mouth at bedtime. 30 tablet 1   No current facility-administered medications for this visit.    Review of Systems  Constitutional:  Negative for fever, malaise/fatigue and weight loss.  Respiratory: Negative.    Cardiovascular: Negative.   Musculoskeletal:  Positive for joint pain and myalgias.   PHYSICAL EXAMINATION: BP 118/79 (BP Location: Right Arm, Patient Position: Sitting)   Pulse 80   Resp 20   Ht '5\' 7"'$  (1.702 m)   Wt 170 lb (77.1 kg)   LMP 04/29/2012   SpO2 96% Comment: RA  BMI 26.63 kg/m   Physical Exam Constitutional:      General: Katie Sherman is not in acute distress.    Appearance: Normal  appearance. Katie Sherman is normal weight. Katie Sherman is not ill-appearing.  Neck:      Comments: Point tenderness over the left sternoclavicular joint and inferior to it. Cardiovascular:     Rate and Rhythm: Normal rate.  Pulmonary:     Effort: Pulmonary effort is normal.  Abdominal:     General: Abdomen is flat.  Neurological:     General: No focal deficit present.     Mental Status: Katie Sherman is alert and oriented to person, place, and time.     Diagnostic Studies & Laboratory data:     Recent Radiology Findings:   CT BIOPSY  Result Date: 03/03/2021 INDICATION: Left chest wall mass, PET avid EXAM: CT BIOPSY MEDICATIONS: None. ANESTHESIA/SEDATION: Moderate (conscious) sedation was employed during this procedure. A total of Versed 2 mg and Fentanyl 100 mcg was administered intravenously. Moderate Sedation Time: 15 minutes. The patient's level of consciousness and vital signs were monitored continuously by radiology nursing throughout the procedure under my direct supervision. FLUOROSCOPY TIME:  N/a COMPLICATIONS: None immediate. PROCEDURE: Informed written consent was obtained from the patient after a thorough discussion of the procedural risks, benefits and alternatives. All questions were addressed. Maximal Sterile Barrier Technique was utilized including caps, mask, sterile gowns, sterile gloves, sterile drape, hand hygiene and skin antiseptic. A timeout was performed prior to the initiation of the procedure. The patient was placed supine on the CT exam table. Limited CT of the upper chest was performed for planning purposes. The skin demonstrated a destructive soft tissue lesion centered at the insertion of the left first rib along the sternum. Skin entry site was marked, the overlying skin was prepped and draped in a standard sterile fashion. Local analgesia was obtained with 1% lidocaine. Using CT fluoroscopy, a 17 gauge introducer needle was advanced just deep to the cortex of the destructive lesion in the  left first rib. Subsequently, core needle biopsy was performed using an 18 gauge core biopsy device x4 passes. Specimens were submitted in formalin to pathology for further analysis. Limited postprocedure imaging demonstrated no complicating feature or pneumothorax. A sterile dressing was placed after hemostasis. The patient tolerated the procedure well without immediate complication. IMPRESSION: Successful CT-guided core needle biopsy of the destructive soft tissue lesion involving the left first rib insertion along sternum. Electronically Signed   By: Scherrie Bateman.D.  On: 03/03/2021 15:50       I have indepndently reviewed the above radiology studies  and reviewed the findings with the patient.   Recent Lab Findings: Lab Results  Component Value Date   WBC 8.0 03/18/2021   HGB 12.4 03/18/2021   HCT 37.1 03/18/2021   PLT 390 03/18/2021   GLUCOSE 90 03/18/2021   CHOL 252 (H) 12/07/2020   TRIG 212 (H) 12/07/2020   HDL 62 12/07/2020   LDLCALC 152 (H) 12/07/2020   ALT 8 03/18/2021   AST 13 (L) 03/18/2021   NA 141 03/18/2021   K 4.2 03/18/2021   CL 104 03/18/2021   CREATININE 1.00 03/18/2021   BUN 13 03/18/2021   CO2 26 03/18/2021   TSH 1.260 01/01/2020   INR 1.0 03/03/2021   HGBA1C 5.3 01/01/2020       Assessment / Plan:   55 yo female with L Wallace joint avidity.  Possible soft tissue tumor vs infection.  I am concerned that Katie Sherman may simply have sternoclavicular joints osteomyelitis and chondritis from a healed abscess potentially.  Ordered an MRI to assess for osteomyelitis.  Once this is can pleated we will determine whether or not Katie Sherman needs a biopsy.  If this is a tumor, then I would recommend that Katie Sherman go to the chest wall reconstruction center in [place].      Lajuana Matte 03/18/2021 5:45 PM

## 2021-03-17 ENCOUNTER — Encounter: Payer: Medicaid Other | Admitting: Thoracic Surgery (Cardiothoracic Vascular Surgery)

## 2021-03-17 ENCOUNTER — Other Ambulatory Visit: Payer: Self-pay

## 2021-03-17 ENCOUNTER — Telehealth: Payer: Self-pay | Admitting: Emergency Medicine

## 2021-03-17 ENCOUNTER — Ambulatory Visit: Payer: Medicaid Other | Admitting: Emergency Medicine

## 2021-03-17 NOTE — Telephone Encounter (Signed)
Spoke with the patient. Plan is consult w Dr Kipp Brood tomorrow. 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 Hopefully we can get her set up for surgical bx asap. I will continue to follow with her. She does not need to follow w me in office today. Will cancel.

## 2021-03-18 ENCOUNTER — Encounter: Payer: Self-pay | Admitting: Hematology and Oncology

## 2021-03-18 ENCOUNTER — Other Ambulatory Visit: Payer: Self-pay | Admitting: Thoracic Surgery (Cardiothoracic Vascular Surgery)

## 2021-03-18 ENCOUNTER — Encounter: Payer: Self-pay | Admitting: *Deleted

## 2021-03-18 ENCOUNTER — Inpatient Hospital Stay: Payer: Self-pay

## 2021-03-18 ENCOUNTER — Institutional Professional Consult (permissible substitution) (INDEPENDENT_AMBULATORY_CARE_PROVIDER_SITE_OTHER): Payer: Self-pay | Admitting: Thoracic Surgery (Cardiothoracic Vascular Surgery)

## 2021-03-18 ENCOUNTER — Inpatient Hospital Stay: Payer: Self-pay | Attending: Hematology and Oncology | Admitting: Physician Assistant

## 2021-03-18 ENCOUNTER — Other Ambulatory Visit: Payer: Self-pay

## 2021-03-18 VITALS — BP 121/81 | HR 86 | Temp 98.6°F | Resp 18 | Ht 67.0 in | Wt 169.6 lb

## 2021-03-18 VITALS — BP 118/79 | HR 80 | Resp 20 | Ht 67.0 in | Wt 170.0 lb

## 2021-03-18 DIAGNOSIS — R232 Flushing: Secondary | ICD-10-CM | POA: Insufficient documentation

## 2021-03-18 DIAGNOSIS — Z8541 Personal history of malignant neoplasm of cervix uteri: Secondary | ICD-10-CM | POA: Insufficient documentation

## 2021-03-18 DIAGNOSIS — Z87442 Personal history of urinary calculi: Secondary | ICD-10-CM | POA: Insufficient documentation

## 2021-03-18 DIAGNOSIS — F17210 Nicotine dependence, cigarettes, uncomplicated: Secondary | ICD-10-CM | POA: Insufficient documentation

## 2021-03-18 DIAGNOSIS — R1310 Dysphagia, unspecified: Secondary | ICD-10-CM | POA: Insufficient documentation

## 2021-03-18 DIAGNOSIS — R222 Localized swelling, mass and lump, trunk: Secondary | ICD-10-CM | POA: Insufficient documentation

## 2021-03-18 DIAGNOSIS — R918 Other nonspecific abnormal finding of lung field: Secondary | ICD-10-CM | POA: Insufficient documentation

## 2021-03-18 DIAGNOSIS — Z803 Family history of malignant neoplasm of breast: Secondary | ICD-10-CM | POA: Insufficient documentation

## 2021-03-18 DIAGNOSIS — K5900 Constipation, unspecified: Secondary | ICD-10-CM | POA: Insufficient documentation

## 2021-03-18 DIAGNOSIS — R0782 Intercostal pain: Secondary | ICD-10-CM

## 2021-03-18 DIAGNOSIS — R110 Nausea: Secondary | ICD-10-CM | POA: Insufficient documentation

## 2021-03-18 DIAGNOSIS — R042 Hemoptysis: Secondary | ICD-10-CM | POA: Insufficient documentation

## 2021-03-18 DIAGNOSIS — Z79899 Other long term (current) drug therapy: Secondary | ICD-10-CM | POA: Insufficient documentation

## 2021-03-18 DIAGNOSIS — R590 Localized enlarged lymph nodes: Secondary | ICD-10-CM | POA: Insufficient documentation

## 2021-03-18 DIAGNOSIS — M542 Cervicalgia: Secondary | ICD-10-CM | POA: Insufficient documentation

## 2021-03-18 DIAGNOSIS — Z800 Family history of malignant neoplasm of digestive organs: Secondary | ICD-10-CM | POA: Insufficient documentation

## 2021-03-18 LAB — CBC WITH DIFFERENTIAL (CANCER CENTER ONLY)
Abs Immature Granulocytes: 0.01 10*3/uL (ref 0.00–0.07)
Basophils Absolute: 0.1 10*3/uL (ref 0.0–0.1)
Basophils Relative: 1 %
Eosinophils Absolute: 0.2 10*3/uL (ref 0.0–0.5)
Eosinophils Relative: 3 %
HCT: 37.1 % (ref 36.0–46.0)
Hemoglobin: 12.4 g/dL (ref 12.0–15.0)
Immature Granulocytes: 0 %
Lymphocytes Relative: 47 %
Lymphs Abs: 3.7 10*3/uL (ref 0.7–4.0)
MCH: 31 pg (ref 26.0–34.0)
MCHC: 33.4 g/dL (ref 30.0–36.0)
MCV: 92.8 fL (ref 80.0–100.0)
Monocytes Absolute: 0.5 10*3/uL (ref 0.1–1.0)
Monocytes Relative: 6 %
Neutro Abs: 3.4 10*3/uL (ref 1.7–7.7)
Neutrophils Relative %: 43 %
Platelet Count: 390 10*3/uL (ref 150–400)
RBC: 4 MIL/uL (ref 3.87–5.11)
RDW: 14.6 % (ref 11.5–15.5)
WBC Count: 8 10*3/uL (ref 4.0–10.5)
nRBC: 0 % (ref 0.0–0.2)

## 2021-03-18 LAB — CMP (CANCER CENTER ONLY)
ALT: 8 U/L (ref 0–44)
AST: 13 U/L — ABNORMAL LOW (ref 15–41)
Albumin: 4 g/dL (ref 3.5–5.0)
Alkaline Phosphatase: 107 U/L (ref 38–126)
Anion gap: 11 (ref 5–15)
BUN: 13 mg/dL (ref 6–20)
CO2: 26 mmol/L (ref 22–32)
Calcium: 9.4 mg/dL (ref 8.9–10.3)
Chloride: 104 mmol/L (ref 98–111)
Creatinine: 1 mg/dL (ref 0.44–1.00)
GFR, Estimated: >60 mL/min (ref >60)
Glucose, Bld: 90 mg/dL (ref 70–99)
Potassium: 4.2 mmol/L (ref 3.5–5.1)
Sodium: 141 mmol/L (ref 135–145)
Total Bilirubin: 0.4 mg/dL (ref 0.3–1.2)
Total Protein: 7.7 g/dL (ref 6.5–8.1)

## 2021-03-18 LAB — LACTATE DEHYDROGENASE: LDH: 193 U/L — ABNORMAL HIGH (ref 98–192)

## 2021-03-18 NOTE — Progress Notes (Signed)
[facility] [phone]   Fax:([phone] [place] NOTE  Patient Care Team: Ladell Pier, MD as PCP - General (Internal Medicine) Danie Binder, MD (Inactive) as Consulting Physician (Gastroenterology) Coralyn Helling, DO (Inactive) as Resident (Sports Medicine)  Hematological/Oncological History 1) Initially presented with neck fullness, left greater than right supraclavicular fullness, neck pain, dysphagia and hemoptysis.  2) 01/11/2021: Evaluated by Dr. Asencion Noble from Pulmonology who recommended CT imaging.   3) 01/21/2021:  -CT soft tissue neck revealed Infiltrative process centered on the left chest wall, especially at left first costochondral junction, with regional adenopathy, mediastinal fat infiltration, and apical pulmonary opacity.  -CT venogram chest: Anterior left upper lobe, mediastinal, and anterior chest wall process as detailed above may represent invasive neoplasm or atypical infectious process . 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 There is associated pre-vascular adenopathy.  4) 02/03/2021: PET scan:Ill-defined hypermetabolic infiltrating soft tissue mass involving the left chest wall, mediastinum and right-sided neck nodes. Findings could be due to an atypical/aggressive lymphoma or sarcoma.Soft tissue thickening and hypermetabolism at the base of the tongue. This appears fairly symmetric and may just be prominent lymphoid tissue but direct visualization is suggested.  5) 02/15/2021: US guided right cervical node biopsy. Pathology revealed nonspecific reactive lymphoid hyperplasia   6) 03/03/2021: CT guided core needle biopsy of soft tissue lesion involving the left first rib. Pathology revealed Spindle cell proliferation with focal cartilage.  7) 03/18/2021: Establish care with hematology/oncology clinic.    CHIEF COMPLAINTS/PURPOSE OF CONSULTATION:  "Left chest  wall mass"  HISTORY OF PRESENTING ILLNESS:  Katie Sherman 55 y.o. female with medical history significant for cervical cancer, carpal tunnel syndrome, nephrolithiasis. Patient is accompanied by her sister-in-law for this visit.   On exam today, Katie Sherman reports that her energy levels are fairly stable. She is able to complete her daily activities on her own. She denies any recent changes to her weight or appetite. She has intermittent episodes of nausea without vomiting. She denies easy bruising or signs of bleeding except for one episode of hemoptysis in July 2022. She continues to have left sided neck, chest, upper rib pain. She rates the pain as 6-7 out of 10 on a pain scale. She generally takes ibuprofen 600 mg for the pain but takes Percocet when pain is severe. She reports the pain radiates down the left upper extremity. She denies any skin changes or neuropathy. Patient reports constipation after taking percocet, with a bowel movement 2-3 times per week. Patient reports episodes of hot flashes.  She denies fevers, chills, night sweats, shortness of breath, chest pain or cough. She has no other complaints.   MEDICAL HISTORY:  Past Medical History:  Diagnosis Date   Bilateral carpal tunnel syndrome 03/19/2018   Cervical cancer ([place])    Cervical cancer ([place])    History of exercise stress test    ETT-Echo 5/18: normal EF, no ischemia   History of loop recorder    currently on person   Kidney stone     SURGICAL HISTORY: Past Surgical History:  Procedure Laterality Date   CERVICAL CONE BIOPSY     TUBAL LIGATION      SOCIAL HISTORY: Social History   Socioeconomic History   Marital status: Widowed    Spouse name: Not on file   Number of children: Not on file   Years of education: Not on file   Highest education  level: Not on file  Occupational History   Occupation: unemployed  Tobacco Use   Smoking status: Every Day    Packs/day: 1.00    Years: 36.00    Pack years: 36.00     Types: Cigarettes    Start date: 1983   Smokeless tobacco: Never   Tobacco comments:    Smokes 1 pack a day ARJ 01/20/21  Vaping Use   Vaping Use: Never used  Substance and Sexual Activity   Alcohol use: No   Drug use: No    Types: Marijuana    Comment: stopped 04/06/2017   Sexual activity: Not Currently    Birth control/protection: None  Other Topics Concern   Not on file  Social History Narrative   Prior traffic control specialist - unemployed now   Widow   5 children, 10 grandchildren   Moved here from [place] 2013   Social Determinants of Health   Financial Resource Strain: Not on file  Food Insecurity: Not on file  Transportation Needs: Not on file  Physical Activity: Not on file  Stress: Not on file  Social Connections: Not on file  Intimate Partner Violence: Not on file    FAMILY HISTORY: Family History  Problem Relation Age of Onset   Heart attack Mother 63   Heart disease Father    Heart attack Father 57   Liver cancer Father    Bone cancer Other    Breast cancer Maternal Aunt        Diagnosed in 50's   Bone cancer Maternal Aunt     ALLERGIES:  has No Known Allergies.  MEDICATIONS:  Current Outpatient Medications  Medication Sig Dispense Refill   albuterol (VENTOLIN HFA) 108 (90 Base) MCG/ACT inhaler Inhale 2 puffs into the lungs every 6 (six) hours as needed for wheezing or shortness of breath. 8 g 2   cyclobenzaprine (FLEXERIL) 10 MG tablet TAKE 1 TABLET (10 MG TOTAL) BY MOUTH DAILY AS NEEDED FOR MUSCLE SPASMS. 90 tablet 2   gabapentin (NEURONTIN) 400 MG capsule Take 1 capsule (400 mg total) by mouth 2 (two) times daily. 60 capsule 1   ibuprofen (ADVIL) 600 MG tablet Take 1 tablet (600 mg total) by mouth every 8 (eight) hours as needed for moderate pain or headache. 30 tablet 1   lamoTRIgine (LAMICTAL) 100 MG tablet TAKE 3 TABLETS (300 MG TOTAL) BY MOUTH DAILY. 90 tablet 1   omeprazole (PRILOSEC) 20 MG capsule Take 1 capsule (20 mg total) by mouth  daily. 90 capsule 3   oxyCODONE-acetaminophen (PERCOCET) 10-325 MG tablet Take 1 tablet by mouth every 8 (eight) hours as needed for up to 14 days for pain. 45 tablet 0   pravastatin (PRAVACHOL) 20 MG tablet Take 1 tablet (20 mg total) by mouth daily. 30 tablet 3   QUEtiapine (SEROQUEL) 200 MG tablet Take 1 tablet (200 mg total) by mouth at bedtime. 30 tablet 1   No current facility-administered medications for this visit.    REVIEW OF SYSTEMS:   Constitutional: ( - ) fevers, ( - )  chills , ( - ) night sweats Eyes: ( - ) blurriness of vision, ( - ) double vision, ( - ) watery eyes Ears, nose, mouth, throat, and face: ( - ) mucositis, ( - ) sore throat Respiratory: ( - ) cough, ( - ) dyspnea, ( - ) wheezes Cardiovascular: ( - ) palpitation, ( - ) chest discomfort, ( - ) lower extremity swelling Gastrointestinal:  ( + )  nausea, ( - ) heartburn, ( - ) change in bowel habits Skin: ( - ) abnormal skin rashes Lymphatics: ( - ) new lymphadenopathy, ( - ) easy bruising Neurological: ( - ) numbness, ( - ) tingling, ( - ) new weaknesses Behavioral/Psych: ( - ) mood change, ( - ) new changes  All other systems were reviewed with the patient and are negative.  PHYSICAL EXAMINATION: ECOG PERFORMANCE STATUS: 1 - Symptomatic but completely ambulatory  Vitals:   03/18/21 1311  BP: 121/81  Pulse: 86  Resp: 18  Temp: 98.6 F (37 C)  SpO2: 99%   Filed Weights   03/18/21 1311  Weight: 169 lb 9.6 oz (76.9 kg)    GENERAL: well appearing female in NAD  SKIN: skin color, texture, turgor are normal, no rashes or significant lesions EYES: conjunctiva are pink and non-injected, sclera clear OROPHARYNX: no exudate, no erythema; lips, buccal mucosa, and tongue normal  NECK: supple. LYMPH:  no palpable lymphadenopathy in the cervical, axillary or supraclavicular lymph nodes. Neck fullness left greater than right with tenderness to palpation in left lower neck.  LUNGS: clear to auscultation and  percussion with normal breathing effort HEART: regular rate & rhythm and no murmurs and no lower extremity edema ABDOMEN: soft, non-tender, non-distended, normal bowel sounds Musculoskeletal: no cyanosis of digits and no clubbing  PSYCH: alert & oriented x 3, fluent speech NEURO: no focal motor/sensory deficits  LABORATORY DATA:  I have reviewed the data as listed CBC Latest Ref Rng & Units 03/18/2021 03/03/2021 02/15/2021  WBC 4.0 - 10.5 K/uL 8.0 9.4 6.8  Hemoglobin 12.0 - 15.0 g/dL 12.4 13.1 11.3(L)  Hematocrit 36.0 - 46.0 % 37.1 39.6 35.2(L)  Platelets 150 - 400 K/uL 390 339 341    CMP Latest Ref Rng & Units 03/18/2021 01/11/2021 12/07/2020  Glucose 70 - 99 mg/dL 90 114(H) -  BUN 6 - 20 mg/dL 13 11 -  Creatinine 0.44 - 1.00 mg/dL 1.00 0.93 -  Sodium 135 - 145 mmol/L 141 137 -  Potassium 3.5 - 5.1 mmol/L 4.2 3.8 -  Chloride 98 - 111 mmol/L 104 96 -  CO2 22 - 32 mmol/L 26 25 -  Calcium 8.9 - 10.3 mg/dL 9.4 8.8 -  Total Protein 6.5 - 8.1 g/dL 7.7 - 7.0  Total Bilirubin 0.3 - 1.2 mg/dL 0.4 - 0.2  Alkaline Phos 38 - 126 U/L 107 - 122(H)  AST 15 - 41 U/L 13(L) - 17  ALT 0 - 44 U/L 8 - 12     PATHOLOGY SURGICAL PATHOLOGY  CASE: [phone]  PATIENT: Katie Sherman  Surgical Pathology Report      Clinical History: lymph node, indeterminate, PET avid (cm)      FINAL MICROSCOPIC DIAGNOSIS:   A. SOFT TISSUE MASS, LEFT CHEST WALL INVOLVING FIRST RIB, NEEDLE CORE  BIOPSY:  - Spindle cell proliferation with focal cartilage, see comment.   COMMENT:   The core biopsy consists of a bland appearing spindle cells and at one  end focal cartilage. Immunohistochemistry is largely negative (ALK,  bcl-2, CD34, CD99, CD117, S100, pancytokeratin, desmin) with the  exception of SMA. There is mild atypia and increased cellularity in the  cartilage. Given the aggressive/destructive nature of the lesion on  imaging and possible lymph node involvement, it is unclear if the  current sample is  truly representative of the process or reactive in  nature. A preliminary diagnosis was discussed with Dr. Lamonte Sakai on 03/09/21.    RADIOGRAPHIC STUDIES: I  have personally reviewed the radiological images as listed and agreed with the findings in the report. CT BIOPSY  Result Date: 03/03/2021 INDICATION: Left chest wall mass, PET avid EXAM: CT BIOPSY MEDICATIONS: None. ANESTHESIA/SEDATION: Moderate (conscious) sedation was employed during this procedure. A total of Versed 2 mg and Fentanyl 100 mcg was administered intravenously. Moderate Sedation Time: 15 minutes. The patient's level of consciousness and vital signs were monitored continuously by radiology nursing throughout the procedure under my direct supervision. FLUOROSCOPY TIME:  N/a COMPLICATIONS: None immediate. PROCEDURE: Informed written consent was obtained from the patient after a thorough discussion of the procedural risks, benefits and alternatives. All questions were addressed. Maximal Sterile Barrier Technique was utilized including caps, mask, sterile gowns, sterile gloves, sterile drape, hand hygiene and skin antiseptic. A timeout was performed prior to the initiation of the procedure. The patient was placed supine on the CT exam table. Limited CT of the upper chest was performed for planning purposes. The skin demonstrated a destructive soft tissue lesion centered at the insertion of the left first rib along the sternum. Skin entry site was marked, the overlying skin was prepped and draped in a standard sterile fashion. Local analgesia was obtained with 1% lidocaine. Using CT fluoroscopy, a 17 gauge introducer needle was advanced just deep to the cortex of the destructive lesion in the left first rib. Subsequently, core needle biopsy was performed using an 18 gauge core biopsy device x4 passes. Specimens were submitted in formalin to pathology for further analysis. Limited postprocedure imaging demonstrated no complicating feature or  pneumothorax. A sterile dressing was placed after hemostasis. The patient tolerated the procedure well without immediate complication. IMPRESSION: Successful CT-guided core needle biopsy of the destructive soft tissue lesion involving the left first rib insertion along sternum. Electronically Signed   By: Albin Felling M.D.   On: 03/03/2021 15:50    ASSESSMENT & PLAN Katie Sherman is a 55 y.o. female who presents to the clinic for evaluation for left chest wall mass. I reviewed CT and PET imaging that showed an infiltrating soft tissue mass involving the left chest wall, mediastinum and right-sided neck nodes. Unfortunately, the recent core needle biopsy was not diagnostic although concerning for sarcoma. Patient was evaluated by Dr. Kipp Brood earlier today with recommendations to proceed with MRI to further assess the left chest wall mass. Based on MRI findings, possible incisional biopsy versus excision with reconstruction. If sarcoma is confirmed, we will refer patient to medical oncology at academic center ([facility] versus [facility]).   #Left chest wall mass: --PET scan from 02/03/2021 revealed ill-defined hypermetabolic infiltrating soft tissue mass involving the left chest wall, mediastinum and right-sided neck nodes --CT guided core biopsy of left chest wall was non diagnostic. Pathology showed spindle cell proliferation with focal cartilage. --Evaluated by Dr. Kipp Brood today with plans to obtain MRI chest.  --Labs today to check CBC, CMP and LDH.  --Pain is currently controlled with ibuprofen and percocet as needed.  --Plan to follow up once MRI chest is reviewed for next steps to confirm diagnosis.   Orders Placed This Encounter  Procedures   CBC with Differential ([facility] Only)    Standing Status:   Future    Number of Occurrences:   1    Standing Expiration Date:   03/18/2022   CMP ([facility] only)    Standing Status:   Future    Number of Occurrences:   1     Standing Expiration Date:   03/18/2022  Lactate dehydrogenase (LDH)    Standing Status:   Future    Number of Occurrences:   1    Standing Expiration Date:   03/18/2022    All questions were answered. The patient knows to call the clinic with any problems, questions or concerns.  I have spent a total of 60 minutes minutes of face-to-face and non-face-to-face time, preparing to see the patient, obtaining and/or reviewing separately obtained history, performing a medically appropriate examination, counseling and educating the patient, ordering tests, documenting clinical information in the electronic health record, and care coordination.   Dede Query, PA-C Department of Hematology/Oncology [place] at [facility] Phone: [phone]  Patient was seen with Dr. Lorenso Courier.   I have read the above note and personally examined the patient. I agree with the assessment and plan as noted above.  Briefly Katie Sherman is a 55 year old female who presents for evaluation of a chest mass of unclear etiology. Findings are concerning for a spindle cell mass, which could potentially represent a sarcoma. We are working with CT surgery to determine the best route forward. Surgical resection or excisional biopsy would be a good next move to determine if this mass is truly malignant. CT surgery recommended MRI. If found to be a sarcoma may need to consider referral to a high volume center.    Ledell Peoples, MD Department of Hematology/Oncology [place] at [facility] Phone: [phone] Pager: [phone] Email: [email or web address]

## 2021-03-18 NOTE — Telephone Encounter (Signed)
Dr Lamonte Sakai, please see pt email and advise on letter she is requesting, thanks!  Katie Sherman to [facility] Pool (supporting Byrum, Rose Fillers, MD)     10:36 AM I just spoke with Dr. Lamonte Sakai but forgot to ask him for another letter excusing me from work because the last one says through August and obviously this is going to go on a lot longer so I need an updated letter. He can leave the part about the chest mass in the letter because I can't wear my seat belt across my chest and if I get pulled by law enforcement I can show them the letter as well. Please & Thank You.

## 2021-03-18 NOTE — Telephone Encounter (Signed)
Yes - Let's write a letter that will cover her until the end of October.  Thanks.

## 2021-03-19 ENCOUNTER — Encounter: Payer: Self-pay | Admitting: Physician Assistant

## 2021-03-24 ENCOUNTER — Ambulatory Visit (INDEPENDENT_AMBULATORY_CARE_PROVIDER_SITE_OTHER): Payer: Self-pay | Admitting: Licensed Clinical Social Worker

## 2021-03-24 ENCOUNTER — Other Ambulatory Visit: Payer: Self-pay

## 2021-03-24 DIAGNOSIS — F330 Major depressive disorder, recurrent, mild: Secondary | ICD-10-CM

## 2021-03-24 DIAGNOSIS — F4310 Post-traumatic stress disorder, unspecified: Secondary | ICD-10-CM

## 2021-03-24 DIAGNOSIS — F411 Generalized anxiety disorder: Secondary | ICD-10-CM

## 2021-03-24 NOTE — Progress Notes (Signed)
THERAPIST PROGRESS NOTE   Session Time: 2:00pm - 3:00pm     Location: Patient: [facility] [place] [facility] Provider: [facility] [place] [facility]   Participation Level: Active   Behavioral Response: Alert, casually dressed, depressed affect/mood    Type of Therapy:  Individual Therapy   Treatment Goals addressed: Depression and Anxiety management; Medication management; Meeting with medical specialists   Interventions: CBT, psychoeducation on effects of marijuana use, relapse prevention, referrals     Summary: Katie Sherman is a 55 year old divorced Caucasian female that presented for therapy appointment and is diagnosed with Generalized Anxiety Disorder; Major Depressive Disorder, recurrent, mild; and PTSD.        Suicidal/Homicidal: None; without intent or plan   Therapist Response: Katie Sherman met with Katie Sherman for in person therapy session and assessed for safety, sobriety, and medication compliance.  Katie Sherman presented for today's appointment on time and was alert, oriented x5, with no evidence or self-report of active SI/HI or A/V H.  Katie Sherman reported ongoing compliance with medication and denied any use of alcohol.  Katie Sherman inquired about Katie Sherman's emotional ratings today, as well as any significant changes in thoughts, feelings, or behavior since previous check-in.  Katie Sherman reported scores of 8/10 for depression, 4/10 for anxiety, 0/10 for anger/irritability.  Katie Sherman denied experiencing any panic attacks or outbursts.  Katie Sherman reported that one issue that has arisen recently is testing positive for marijuana on a drug screen from her probation officer.  Katie Sherman inquired about how much marijuana that Katie Sherman is currently using and how often.  She reported that she is smoking two marijuana joints per day, and has been doing this for several weeks now to help her fall asleep, deal with stress, and pain from her medical conditions.  Katie Sherman encouraged Katie Sherman to abstain from using marijuana, and  provided psychoeducation on the harmful mental/physical effects that can result from continued use, as well as potential legal, social, and financial concerns.  Katie Sherman reported that she has noticed she has been more isolated, her anxiety has not improved, the marijuana is cutting into her budget, and she will have to attend and pay for [facility] classes if she tests positive again, which would further increase stress.  She reported that her probation officer suggested that she explore healthier substitutes to replace her marijuana use with.  Katie Sherman agreed with this suggestion, and offered to discuss some relaxation skills with Katie Sherman that she could begin practicing in order to help manage stress more effectively.  Katie Sherman declined this offer, stating "Maybe next time".  Katie Sherman reported that an additional stressor has been dealing with mixed messages from her providers handling medical care, and inability to pay for procedures that are being ordered.  Katie Sherman provided contact information for [name] and encouraged her to outreach them this week about concerns for further assistance and guidance.  Interventions were effective, as evidenced by Katie Sherman reporting increased motivation to cut back on marijuana use following this discussion, stating "I'm definitely gonna have to cut back.  I don't even have the money to afford it right now and I need to get my car fixed soon".  She also expressed appreciation for [facility] referrals to assist with medical challenges.  Katie Sherman will continue to monitor.                 Plan: Meet again in 1 week.   Diagnosis: Generalized Anxiety Disorder; Major Depressive Disorder, recurrent, mild; and PTSD.   Shade Flood, LCSW,  LCAS 03/24/21

## 2021-03-25 ENCOUNTER — Other Ambulatory Visit: Payer: Self-pay

## 2021-03-25 ENCOUNTER — Ambulatory Visit (INDEPENDENT_AMBULATORY_CARE_PROVIDER_SITE_OTHER): Payer: Self-pay | Admitting: Thoracic Surgery (Cardiothoracic Vascular Surgery)

## 2021-03-25 DIAGNOSIS — R222 Localized swelling, mass and lump, trunk: Secondary | ICD-10-CM

## 2021-03-25 NOTE — Progress Notes (Signed)
     [address]       [place] [address]             [phone]       Patient: Home Provider: Office Consent for Telemedicine visit obtained.  Today's visit was completed via a real-time telehealth (see specific modality noted below). The patient/authorized person provided oral consent at the time of the visit to engage in a telemedicine encounter with the present provider at [facility]. The patient/authorized person was informed of the potential benefits, limitations, and risks of telemedicine. The patient/authorized person expressed understanding that the laws that protect confidentiality also apply to telemedicine. The patient/authorized person acknowledged understanding that telemedicine does not provide emergency services and that he or she would need to call 911 or proceed to the nearest hospital for help if such a need arose.   Total time spent in the clinical discussion 10 minutes.  Telehealth Modality: Phone visit (audio only)  I had a telephone visit with Katie Sherman.  Patient canceled her MRI due to inability to pay the down payment.  I explained to her the importance of obtaining this imaging for preoperative planning.  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  She is agreeable to a referral to [facility].  This has been made.  She will call us with any questions or concerns.

## 2021-03-25 NOTE — Telephone Encounter (Signed)
Will forward to provider  

## 2021-03-28 MED ORDER — OXYCODONE-ACETAMINOPHEN 10-325 MG PO TABS: 1.0000 | ORAL_TABLET | Freq: Three times a day (TID) | ORAL | 0 refills | Status: AC | PRN

## 2021-03-29 ENCOUNTER — Other Ambulatory Visit: Payer: Self-pay

## 2021-03-29 ENCOUNTER — Ambulatory Visit (INDEPENDENT_AMBULATORY_CARE_PROVIDER_SITE_OTHER): Payer: Self-pay | Admitting: Licensed Clinical Social Worker

## 2021-03-29 DIAGNOSIS — F411 Generalized anxiety disorder: Secondary | ICD-10-CM

## 2021-03-29 DIAGNOSIS — F330 Major depressive disorder, recurrent, mild: Secondary | ICD-10-CM

## 2021-03-29 DIAGNOSIS — F4310 Post-traumatic stress disorder, unspecified: Secondary | ICD-10-CM

## 2021-03-29 NOTE — Progress Notes (Signed)
Virtual Visit via Telephone Note   I connected with Katie Sherman on 03/29/21 at 3:00pm by telephone and verified that I am speaking with the correct person using two identifiers.   I discussed the limitations, risks, security and privacy concerns of performing an evaluation and management service by telephone and the availability of in person appointments. I also discussed with the patient that there may be a patient responsible charge related to this service. The patient expressed understanding and agreed to proceed.   I discussed the assessment and treatment plan with the patient. The patient was provided an opportunity to ask questions and all were answered. The patient agreed with the plan and demonstrated an understanding of the instructions.   The patient was advised to call back or seek an in-person evaluation if the symptoms worsen or if the condition fails to improve as anticipated.   I provided 45 minutes of non-face-to-face time during this encounter.     Shade Flood, LCSW, LCAS ______________________________ THERAPIST PROGRESS NOTE   Session Time: 3:00pm - 3:45pm   Location: Patient: Patient home Provider: [facility] [place] [facility]   Participation Level: Active   Behavioral Response: Alert, anxious mood   Type of Therapy:  Individual Therapy   Treatment Goals addressed: Depression and Anxiety management; Medication management; Sleep hygiene    Interventions: CBT, pain relief meditation    Summary: Katie Sherman is a 55 year old divorced Caucasian female that presented for therapy appointment and is diagnosed with Generalized Anxiety Disorder; Major Depressive Disorder, recurrent, mild; and PTSD.        Suicidal/Homicidal: None; without intent or plan   Therapist Response: Clinician spoke with Katie Sherman for therapy appointment via telephone call today, as she requested this instead due to poor sleep the night before.  Clinician assessed for safety, sobriety, and medication  compliance.  Katie Sherman answered phone call on time and spoke in a manner that was alert, oriented x5, with no evidence or self-report of active SI/HI or A/V H.  Katie Sherman reported that she continues taking medication as prescribed and denied any use of alcohol.  She reported continuing daily use of marijuana up until yesterday when she ran out, stating "It definitely increased my anxiety and kept me from falling asleep last night".  Clinician inquired about Katie Sherman's current emotional ratings, as well as any significant changes in thoughts, feelings, or behavior since last check-in.  Katie Sherman reported scores of 3/10 for depression, 8/10 for anxiety, 0/10 for anger/irritability.  Katie Sherman denied experiencing any panic attacks or outbursts.  Katie Sherman reported that in addition to sleeping poorly, her pain level was at an 8/10 in severity today and her stomach was upset.  She reported that she had not taken any of her prescription pain medication yet because she has been trying to avoid overreliance upon them.  Clinician offered to teach Katie Sherman a mindfulness meditation exercise today focused on pain relief, and she was agreeable to this.  Clinician guided Katie Sherman through process of getting comfortable, achieving relaxing breathing rhythm, and then practicing combination activity involving comprehensive body scan, and reciting pain relief affirmations. Intervention was effective, as evidenced by Katie Sherman successfully engaging in this activity and reporting that her pain and anxiety levels both reduced to 4/10 in severity, and she felt more relaxed, and less tense afterward.  Katie Sherman stated "I can see why people fall asleep doing something like this.  Focusing on my breathing helped clear my mind and I feel a lot better".  Clinician provided instructions in  an email to [place] so that she could repeat this on her own, and will continue to monitor.                   Plan: Meet again in 1 week.   Diagnosis:  Generalized Anxiety Disorder; Major Depressive Disorder, recurrent, mild; and PTSD.   Shade Flood, LCSW, LCAS 03/29/21

## 2021-03-30 ENCOUNTER — Other Ambulatory Visit: Payer: Medicaid Other

## 2021-04-05 ENCOUNTER — Other Ambulatory Visit: Payer: Self-pay

## 2021-04-05 ENCOUNTER — Ambulatory Visit (HOSPITAL_COMMUNITY): Payer: Self-pay | Admitting: Licensed Clinical Social Worker

## 2021-04-07 ENCOUNTER — Other Ambulatory Visit: Payer: Self-pay

## 2021-04-07 ENCOUNTER — Ambulatory Visit (INDEPENDENT_AMBULATORY_CARE_PROVIDER_SITE_OTHER): Payer: Self-pay | Admitting: Licensed Clinical Social Worker

## 2021-04-07 DIAGNOSIS — F4310 Post-traumatic stress disorder, unspecified: Secondary | ICD-10-CM

## 2021-04-07 DIAGNOSIS — F330 Major depressive disorder, recurrent, mild: Secondary | ICD-10-CM

## 2021-04-07 DIAGNOSIS — F411 Generalized anxiety disorder: Secondary | ICD-10-CM

## 2021-04-07 NOTE — Progress Notes (Signed)
Virtual Visit via Telephone Note   I connected with Katie Sherman on 04/07/21 at 4:00pm by telephone and verified that I am speaking with the correct person using two identifiers.   I discussed the limitations, risks, security and privacy concerns of performing an evaluation and management service by telephone and the availability of in person appointments. I also discussed with the patient that there may be a patient responsible charge related to this service. The patient expressed understanding and agreed to proceed.   I discussed the assessment and treatment plan with the patient. The patient was provided an opportunity to ask questions and all were answered. The patient agreed with the plan and demonstrated an understanding of the instructions.   The patient was advised to call back or seek an in-person evaluation if the symptoms worsen or if the condition fails to improve as anticipated.   I provided 45 minutes of non-face-to-face time during this encounter.     Shade Flood, LCSW, LCAS ______________________________ THERAPIST PROGRESS NOTE   Session Time: 4:00pm - 4:45pm   Location: Patient: Patient home Provider: [facility] [place] [facility]   Participation Level: Active   Behavioral Response: Alert, depressed mood   Type of Therapy:  Individual Therapy   Treatment Goals addressed: Depression and Anxiety management; Medication management; Keeping appointments with specialists   Interventions: CBT: behavioral activation    Summary: Katie Sherman is a 55 year old divorced Caucasian female that presented for therapy appointment and is diagnosed with Generalized Anxiety Disorder; Major Depressive Disorder, recurrent, mild; and PTSD.        Suicidal/Homicidal: None; without intent or plan   Therapist Response: Clinician spoke with Katie Sherman for therapy session via telephone call today, as she requested this format instead due to issues with her car impeding travel.  Clinician assessed for  safety, sobriety, and medication compliance.  Katie Sherman answered phone call on time and spoke in a manner that was alert, oriented x5, with no evidence or self-report of active SI/HI or A/V H.  Katie Sherman reported ongoing compliance with medication and denied any use of alcohol.  She reported that she is using marijuana x2 per day.  Clinician inquired about Katie Sherman's emotional ratings today, as well as any significant changes in thoughts, feelings, or behavior since previous check-in.  Katie Sherman reported scores of 8/10 for depression, 7/10 for anxiety, 0/10 for anger/irritability.  Katie Sherman denied experiencing any panic attacks or outbursts.  She reported that one success was making an appointment with [facility] on October 17th for testing so she can get answers about status of the mass in her chest.  Katie Sherman reported that she has been feeling more depressed, as she has not been very active, and stays inside most days.  Clinician suggested discussion on additional coping skills to help her manage depression, which she was agreeable to.  Clinician utilized a handout to guide discussion, which explained how individuals with depression tend to feel lack of energy and motivation, resulting in less activity and worsening symptoms over time.  Katie Sherman was encouraged to engage in behavioral activation to improve mental health by identifying various activities in 5 categories (i.e. exercise, socializing, responsibilities, hobbies, and personal care) she could include in daily routine, such as going on walks, joining a club/group, doing housework, finding meaningful hobbies, or tending to spiritual needs.  Tips were provided to improve consistency as well, such as starting small by breaking activities into smaller, more manageable steps, making a realistic plan, or including friends/family along for improved  chances of commitment.  Intervention was effective, as evidenced by Katie Sherman engaging in discussion on subject, and  identifying several activities to include in her daily schedule, such as taking walks with a friend to increase exercise, having daily phone calls with positive supports, taking care of household chores, and getting back into knitting as a hobby to preoccupy her time.  Katie Sherman stated "I need to keep busy and distract myself because sitting here at home, I just dwell on things and make myself more depressed.  I'm also going to think about getting a part-time job to fill up my time and make a little money".  Clinician will continue to monitor.   Plan: Meet again in 1 week.   Diagnosis: Generalized Anxiety Disorder; Major Depressive Disorder, recurrent, mild; and PTSD.   Shade Flood, LCSW, LCAS 04/07/21

## 2021-04-11 ENCOUNTER — Encounter: Payer: Self-pay | Admitting: Physician Assistant

## 2021-04-11 ENCOUNTER — Other Ambulatory Visit: Payer: Self-pay

## 2021-04-12 ENCOUNTER — Other Ambulatory Visit: Payer: Self-pay

## 2021-04-12 ENCOUNTER — Telehealth: Payer: Self-pay | Admitting: Physician Assistant

## 2021-04-12 MED ORDER — OXYCODONE-ACETAMINOPHEN 10-325 MG PO TABS: 1.0000 | ORAL_TABLET | Freq: Three times a day (TID) | ORAL | 0 refills | Status: DC | PRN

## 2021-04-12 MED ORDER — OXYCODONE-ACETAMINOPHEN 10-325 MG PO TABS
1.0000 | ORAL_TABLET | Freq: Three times a day (TID) | ORAL | 0 refills | Status: DC | PRN
  Filled 2021-04-12: qty 45, 15d supply, fill #0

## 2021-04-12 NOTE — Telephone Encounter (Signed)
I spoke to Katie Sherman today after she sent a patient message notifying us that the referral to [facility] was cancelled as she currently does not have insurance. She was offered a referral to [facility] but she declined due to distance and issues with transportation. Patient reports that her chest pain is persistent and only improves with percocet. I will reach out to Dr. Kipp Brood and inquire if a referral to [facility] is appropriate. Patient expressed understanding.

## 2021-04-12 NOTE — Telephone Encounter (Signed)
I refilled this for her: Percocet 10/325 mg, 1 p.o. every 8 hours as needed pain,#45, 0 RF  Please let her know.  I will forward a copy of this message to Dr. Joya Gaskins as Juluis Rainier so he knows what was sent and what she has received.

## 2021-04-12 NOTE — Telephone Encounter (Signed)
Dr. Lamonte Sakai, Please see patient message and advise regarding refilling the Percocet while Dr. Joya Gaskins is out of town.  She would like it sent to [facility] at [facility] if you decide to refill.  Thanks.

## 2021-04-13 ENCOUNTER — Other Ambulatory Visit: Payer: Self-pay

## 2021-04-14 ENCOUNTER — Other Ambulatory Visit: Payer: Self-pay

## 2021-04-18 NOTE — Telephone Encounter (Signed)
I share the patient's concerns. Not clear to me why this has been delayed. Please see if we can get her in with Dr Lorenso Courier ASAP.
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
## 2021-04-19 ENCOUNTER — Telehealth: Payer: Self-pay | Admitting: Physician Assistant

## 2021-04-19 ENCOUNTER — Telehealth: Payer: Self-pay

## 2021-04-19 NOTE — Telephone Encounter (Signed)
I reached out to Dr. [facility] office today, again requesting a follow up. Dr. Lorenso Courier is awaiting a confirmed diagnosis before providing recommendations. Since patient's referral to [facility] was cancelled, we are awaiting Dr. Abran [facility] recommendations for a surgical biopsy.

## 2021-04-19 NOTE — Telephone Encounter (Signed)
Contacted pt per Dede Query PA:  let her know that we have reached out to Dr. [facility] office more than once. We are needing a biopsy diagnosis before we can provide recommendations. Pt acknowledged and verbalized understanding.

## 2021-04-19 NOTE — Telephone Encounter (Deleted)
I reached out to Dr. [facility] office today, again requesting a follow up. Dr. Lorenso Courier is awaiting a confirmed diagnosis before providing recommendations. Since patient's referral to [facility] was cancelled, we are awaiting Dr. Abran [facility] recommendations for a surgical biopsy.

## 2021-04-21 ENCOUNTER — Ambulatory Visit (INDEPENDENT_AMBULATORY_CARE_PROVIDER_SITE_OTHER): Payer: Self-pay | Admitting: Licensed Clinical Social Worker

## 2021-04-21 ENCOUNTER — Other Ambulatory Visit: Payer: Self-pay

## 2021-04-21 DIAGNOSIS — R222 Localized swelling, mass and lump, trunk: Secondary | ICD-10-CM

## 2021-04-21 DIAGNOSIS — F4310 Post-traumatic stress disorder, unspecified: Secondary | ICD-10-CM

## 2021-04-21 DIAGNOSIS — F330 Major depressive disorder, recurrent, mild: Secondary | ICD-10-CM

## 2021-04-21 DIAGNOSIS — F411 Generalized anxiety disorder: Secondary | ICD-10-CM
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
## 2021-04-21 NOTE — Progress Notes (Signed)
Referral faxed to [facility]

## 2021-04-25 ENCOUNTER — Telehealth (HOSPITAL_COMMUNITY): Payer: Self-pay | Admitting: Psychiatry

## 2021-04-25 MED ORDER — OXYCODONE-ACETAMINOPHEN 10-325 MG PO TABS: 1.0000 | ORAL_TABLET | Freq: Three times a day (TID) | ORAL | 0 refills | Status: DC | PRN

## 2021-04-27 ENCOUNTER — Ambulatory Visit: Payer: Medicaid Other

## 2021-05-02 ENCOUNTER — Other Ambulatory Visit: Payer: Self-pay

## 2021-05-02 ENCOUNTER — Telehealth (HOSPITAL_BASED_OUTPATIENT_CLINIC_OR_DEPARTMENT_OTHER): Payer: Self-pay | Admitting: Psychiatry

## 2021-05-02 ENCOUNTER — Encounter (HOSPITAL_COMMUNITY): Payer: Self-pay | Admitting: Psychiatry

## 2021-05-02 DIAGNOSIS — F319 Bipolar disorder, unspecified: Secondary | ICD-10-CM

## 2021-05-02 MED ORDER — QUETIAPINE FUMARATE 200 MG PO TABS
200.0000 mg | ORAL_TABLET | Freq: Every day | ORAL | 0 refills | Status: DC
Start: 2021-05-02 — End: 2021-05-17
  Filled 2021-05-02: qty 30, 30d supply, fill #0

## 2021-05-02 MED ORDER — LAMOTRIGINE 100 MG PO TABS
ORAL_TABLET | ORAL | 0 refills | Status: DC
  Filled 2021-05-02: qty 90, 30d supply, fill #0

## 2021-05-02 MED ORDER — GABAPENTIN 400 MG PO CAPS
400.0000 mg | ORAL_CAPSULE | Freq: Two times a day (BID) | ORAL | 0 refills | Status: DC
  Filled 2021-05-02: qty 60, 30d supply, fill #0
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
## 2021-05-03 ENCOUNTER — Telehealth: Payer: Self-pay | Admitting: Internal Medicine

## 2021-05-03 NOTE — Telephone Encounter (Signed)
Phone call placed to Dr. Abran [facility] office today.  I left a message on the charge nurse's Thurmond Butts) voicemail informing her that I am trying to reach Dr. Kipp Brood in regards to this patient.  I sent him an inbox staff message yesterday and so did Dr. Asencion Noble and we desperately need to speak with him regarding this patient.  I request a call back or reply to the inbox message.  I left my cell phone number.  Message that I sent to him via inbox yesterday was to inform him that this patient is uninsured and has been turned down by [facility] because of this.  She has limited other options and we need to know whether he would consider doing diagnostic procedure on her so that she can at least have a diagnosis.

## 2021-05-04 ENCOUNTER — Telehealth: Payer: Self-pay | Admitting: Internal Medicine

## 2021-05-04 NOTE — Telephone Encounter (Signed)
Phone call placed to Dr. Abran [facility] office this morning.  I left a detailed voice message on his nurses voicemail requesting that he gives me a call when he gets a moment. Phone call placed to patient.  I left her a voicemail letting her know that I have received message from Dr. Adele Schilder regarding the fact that her appointment at [facility] was canceled.  I informed her that both myself and Dr. Joya Gaskins have reached out to Dr. Kipp Brood to see if we can move things along.  I will get back to her once I have heard from Dr. Kipp Brood.

## 2021-05-05 ENCOUNTER — Telehealth: Payer: Self-pay | Admitting: Internal Medicine

## 2021-05-05 NOTE — Telephone Encounter (Signed)
Referral sent to Novella Olive, Attorney/ [facility]

## 2021-05-05 NOTE — Telephone Encounter (Signed)
Pt calling to return call to [facility]. Please advise.

## 2021-05-05 NOTE — Telephone Encounter (Signed)
Phone call placed to patient this a.m.  I left her a voicemail message letting her know that we have heard back from Dr. Kipp Brood and he is of the opinion that she needs to have the biopsy and what ever follow-up surgical procedure that may be needed to be done at one of the larger medical centers like [place], [facility] or [facility].  He indicated that he wanted her to have an MRI of the chest which she did not want to have done.  In any event, Dr. Joya Gaskins and I have discussed this this morning.  He will work with our referral coordinator to try to get her in at [facility].  We will also have our caseworker try to work with her in getting emergency Medicaid.  Patient informed that Dr. Joya Gaskins will give her a call once he is coordinated everything.

## 2021-05-05 NOTE — Telephone Encounter (Signed)
Pt is very anxious.. Please FU as requesting to speak with Opal Sidles... asap at [phone] to advise

## 2021-05-05 NOTE — Telephone Encounter (Signed)
Call was placed to the patient this morning to obtain consent to place a referral to [facility]) requesting assistance with submitting an application for Medicaid/ Emergency Medicaid as she is in need of urgent medical care/surgery at a medical facility outside of [facility] and she only has family planning Medicaid. Message was left for patient this morning with call back requested to this CM.   Request sent to Maren Reamer, [facility] referral specialist to check on status of referral sent to [facility] cardiothoracic surgery sent by oncology 04/21/2021.    Response from Alinda Sierras stated  that Dr Druscilla Brownie [facility] reviewed the referral and notes and declined to see the patient and stated that the patient needs to be seen at [facility].  This information was faxed on 10/19 to Oncologist Dr Narda Rutherford.  Call returned to patient this evening and explained to her that [facility] may be able to assist with Emergency Medicaid application, there is no guarantee that they can help expedite an application; but we can try.   She was in agreement to placing a referral to [facility] and releasing her medical information to them as needed.

## 2021-05-06 ENCOUNTER — Ambulatory Visit: Payer: Medicaid Other

## 2021-05-08 ENCOUNTER — Telehealth: Payer: Self-pay | Admitting: Critical Care Medicine

## 2021-05-08 MED ORDER — OXYCODONE-ACETAMINOPHEN 10-325 MG PO TABS: 1.0000 | ORAL_TABLET | Freq: Three times a day (TID) | ORAL | 0 refills | Status: DC | PRN

## 2021-05-08 NOTE — Telephone Encounter (Signed)
See notes from Dr Kipp Brood.  Jaya: please schedule stat MRI of chest, order is already in Epic from dr lightfoot.  Dr lightfoot will do surgical bx so we can figure out what is going on  Then hopefully can get [facility] to [facility] if surgical approach felt appropriate  I called the pt and told this information   she now informs me a mass is starting to grow up the back side of her neck  I told her if symptoms are severe she MUST go to ED and there they will be compelled to do the MRI , however if she is not at ED , [place] we MUST get it done asap

## 2021-05-08 NOTE — Telephone Encounter (Signed)
-----   Message from Lajuana Matte, MD sent at 05/08/2021 12:21 PM EDT ----- We will schedule her for an open biopsy, but she need to made aware that if this a cancer, I cannot perform this operation.  Please make sure she has an MRI chest before she sees me.  Thanks,  H. ----- Message ----- From: Orson Slick, MD Sent: 05/06/2021   5:59 PM EDT To: Elsie Stain, MD, Collene Gobble, MD, #  Thank you all for your input. From an Oncology perspective we are stuck until we can get a definitive tissue diagnosis. We have ordered all we can from blood work, but nothing has proven diagnostic. Once we know what we are dealing with we can make further recommendations and treatment plans. This is a deeply saddening case, I hope we can find solutions soon.   ----- Message ----- From: Collene Gobble, MD Sent: 05/06/2021   9:01 AM EDT To: Elsie Stain, MD, Lajuana Matte, MD, #  Pat,  Thank you for advocating for Ms Pitstick. I regret that this has dragged on so long, and very disappointed that she has been turned away from tertiary center and the care she needs.  I unfortunately don't think the lesion is reachable bronchoscopically unless it has evolved / progressed. I'm willing to take her for FOB if we ever believe this would be helpful.   Rob   ----- Message ----- From: Elsie Stain, MD Sent: 05/06/2021   6:22 AM EDT To: Collene Gobble, MD, Lajuana Matte, MD, #  Dr Kipp Brood.  Dr Amalia Hailey  So both [facility] and [facility] have declined to take this case  Dr Kipp Brood, if you have time can I request you please call me today?   Or next week?  I used to practice at [facility] in the hosp in [facility] and pulmonary with Rob.  You did help me on one of my cases when you first came to [facility] and I have not yet had a chance to meet you as I am now an outpatient internal medicine PCP MD at community care clinics  My partner Dr Wynetta Emery had me review her case and I had also sent her  to Londell Moh who referred her to you.     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  This seems very unlikely to me to be osteomyelitis.  Normal CBC etc.  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  She will die a miserable death from strangulation  Is there at least a surgical approach to getting a diagnosis so Dr Lorenso Courier and team at the cancer center can at least do some type of palliative Rx.  I totally understand we cannot do major chest wall reconstruction at [facility]   Please call me:  she is only 55yo and uninsured.  We are trying to get her emergency medicaid but this will take months in [place] which has backwardly not expanded Medicaid.  Adding Drs Lamonte Sakai and Lorenso Courier to this case  Textbook example of health inequity  Dr Lyda Jester  Cell  [phone]

## 2021-05-09 ENCOUNTER — Other Ambulatory Visit: Payer: Self-pay

## 2021-05-09 ENCOUNTER — Ambulatory Visit (HOSPITAL_COMMUNITY)
Admission: RE | Admit: 2021-05-09 | Discharge: 2021-05-09 | Disposition: A | Discharge status: Home / Self Care | Payer: Self-pay | Source: Ambulatory Visit | Attending: Thoracic Surgery (Cardiothoracic Vascular Surgery) | Admitting: Thoracic Surgery (Cardiothoracic Vascular Surgery)

## 2021-05-09 DIAGNOSIS — R0782 Intercostal pain: Secondary | ICD-10-CM | POA: Insufficient documentation

## 2021-05-09 MED ORDER — GADOBUTROL 1 MMOL/ML IV SOLN
10.0000 mL | Freq: Once | INTRAVENOUS | Status: AC | PRN
  Administered 2021-05-09: 10 mL via INTRAVENOUS

## 2021-05-09 MED ORDER — OXYCODONE-ACETAMINOPHEN 10-325 MG PO TABS: 1.0000 | ORAL_TABLET | Freq: Three times a day (TID) | ORAL | 0 refills | Status: DC | PRN

## 2021-05-09 NOTE — Telephone Encounter (Signed)
Contacted pt to go over appointment information.   MRI is scheduled for October 31 at [place] at [facility]. Pt will need to arrive at 445pm.   Pt is aware and doesn't have any questions or concerns

## 2021-05-17 ENCOUNTER — Telehealth (HOSPITAL_BASED_OUTPATIENT_CLINIC_OR_DEPARTMENT_OTHER): Payer: Self-pay | Admitting: Psychiatry

## 2021-05-17 ENCOUNTER — Other Ambulatory Visit: Payer: Self-pay

## 2021-05-17 ENCOUNTER — Encounter (HOSPITAL_COMMUNITY): Payer: Self-pay | Admitting: Psychiatry

## 2021-05-17 DIAGNOSIS — F319 Bipolar disorder, unspecified: Secondary | ICD-10-CM

## 2021-05-17 MED ORDER — QUETIAPINE FUMARATE 200 MG PO TABS
200.0000 mg | ORAL_TABLET | Freq: Every day | ORAL | 2 refills | Status: DC
  Filled 2021-05-17 – 2021-05-24 (×2): qty 30, 30d supply, fill #0
  Filled 2021-06-21: qty 30, 30d supply, fill #1
  Filled 2021-08-02 – 2021-08-03 (×2): qty 30, 30d supply, fill #0

## 2021-05-17 MED ORDER — GABAPENTIN 400 MG PO CAPS
400.0000 mg | ORAL_CAPSULE | Freq: Two times a day (BID) | ORAL | 2 refills | Status: DC
Start: 2021-05-17 — End: 2021-08-18
  Filled 2021-05-17 – 2021-05-24 (×2): qty 60, 30d supply, fill #0
  Filled 2021-06-21: qty 60, 30d supply, fill #1
  Filled 2021-08-12 – 2021-08-15 (×2): qty 60, 30d supply, fill #0

## 2021-05-17 MED ORDER — LAMOTRIGINE 100 MG PO TABS
ORAL_TABLET | ORAL | 2 refills | Status: DC
Start: 2021-05-17 — End: 2021-08-16
  Filled 2021-05-17 – 2021-05-24 (×2): qty 90, 30d supply, fill #0
  Filled 2021-06-27 – 2021-07-06 (×2): qty 90, 30d supply, fill #1
  Filled 2021-08-02 – 2021-08-03 (×2): qty 90, 30d supply, fill #0
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
## 2021-05-20 ENCOUNTER — Ambulatory Visit (INDEPENDENT_AMBULATORY_CARE_PROVIDER_SITE_OTHER): Payer: Self-pay | Admitting: Thoracic Surgery (Cardiothoracic Vascular Surgery)

## 2021-05-20 ENCOUNTER — Other Ambulatory Visit: Payer: Self-pay

## 2021-05-20 VITALS — BP 113/72 | HR 82 | Resp 20 | Ht 67.0 in | Wt 170.0 lb

## 2021-05-20 DIAGNOSIS — R222 Localized swelling, mass and lump, trunk: Secondary | ICD-10-CM

## 2021-05-20 NOTE — Progress Notes (Signed)
      [address]       [place] [address]             [phone]        Katie Sherman [place] Medical Record #[id number] Date of Birth: 1965/11/13  Referring: Elsie Stain, MD Primary Care: Ladell Pier, MD Primary Cardiologist:None  Reason for visit:   follow-up  History of Present Illness:     [name] comes in today to review her MRI report.  Her pain is much improved as well as her swelling.  On review of the MRI the chest wall mass has regressed in size, and the lymphadenopathy is also improved.  Physical Exam: BP 113/72   Pulse 82   Resp 20   Ht 5\' 7"  (1.702 m)   Wt 170 lb (77.1 kg)   LMP 04/29/2012   SpO2 96% Comment: RA  BMI 26.63 kg/m   Alert NAD No chest wall erythema Abdomen soft, ND No  peripheral edema   Diagnostic Studies & Laboratory data:  MRI: IMPRESSION: 1. Substantial improvement in the abnormal infiltrative process in the left medial infraclavicular region, along the inferior margin of the medial left subclavius muscle. On previous exams this process demonstrated substantial mediastinal, substernal, and anterior left upper lobe involvement on the CT scans from July, and on the PET-CT had progressed to bony destructive findings in the anterior left first rib. On today's examination, the pulmonary, mediastinal, and substernal components have resolved, rib involvement is now questionable, and the effect of this process on the adjacent internal jugular and subclavian veins is substantially reduced. Moreover, the right level IV adenopathy and mediastinal adenopathy shown on the prior PET-CT has resolved. The remaining enhancing lesion measures up to 2.7 cm in long axis. The substantial improvement without known specific therapy would seem to suggest inflammatory or infectious process, but I do note that the patient did not have leukocytosis back in August. Possibility of an underlying mass or vascular  malformation which previously bled causing inflammatory response without substantial leukocytosis is a possibility, although the aggressive findings previously present along the left anterior first rib with tended favor either tumor or infection rather than the sequela of a hematoma. Overall given the substantial improvement, surveillance might be a reasonable option although open biopsy to definitively sample and/or remove the underlying lesion with likewise be a very reasonable choice.  Assessment / Plan:   55 year old female with a chest wall mass.  It is regressed in size on most recent MRI.  This fits with my original concern for an infective process involving her sternoclavicular joint.  On review of the imaging I do not see anything that would be definitive for surgical biopsy.  I have recommended that she undergo a repeat MRI in 3 to 6 months, and she has been set up for virtual visit for that.   Katie Sherman 05/20/2021 4:31 PM

## 2021-05-23 ENCOUNTER — Other Ambulatory Visit: Payer: Self-pay

## 2021-05-24 ENCOUNTER — Other Ambulatory Visit: Payer: Self-pay

## 2021-05-24 ENCOUNTER — Other Ambulatory Visit: Payer: Self-pay | Admitting: Internal Medicine

## 2021-05-24 DIAGNOSIS — K219 Gastro-esophageal reflux disease without esophagitis: Secondary | ICD-10-CM

## 2021-05-24 MED ORDER — OMEPRAZOLE 20 MG PO CPDR
20.0000 mg | DELAYED_RELEASE_CAPSULE | Freq: Every day | ORAL | 1 refills | Status: DC
Start: 2021-05-24 — End: 2021-08-18
  Filled 2021-05-24: qty 30, 30d supply, fill #0
  Filled 2021-07-11: qty 30, 30d supply, fill #1
  Filled 2021-08-12 – 2021-08-15 (×2): qty 30, 30d supply, fill #0

## 2021-05-24 NOTE — Telephone Encounter (Signed)
Requested Prescriptions  Pending Prescriptions Disp Refills  . omeprazole (PRILOSEC) 20 MG capsule 90 capsule 1    Sig: Take 1 capsule (20 mg total) by mouth daily.     Gastroenterology: Proton Pump Inhibitors Passed - 05/24/2021 11:15 AM      Passed - Valid encounter within last 12 months    Recent Outpatient Visits          4 months ago Hemoptysis   [place] Elsie Stain, MD   4 months ago [place], [place], MD   4 months ago Neck pain   [place] Ladell Pier, MD   6 months ago Bilateral carpal tunnel syndrome   [place] Ladell Pier, MD   9 months ago Viral respiratory illness   [place] Ladell Pier, MD

## 2021-05-26 NOTE — Telephone Encounter (Signed)
RB please advise. thanks 

## 2021-05-30 ENCOUNTER — Encounter: Payer: Self-pay | Admitting: Critical Care Medicine

## 2021-05-31 ENCOUNTER — Encounter: Payer: Self-pay | Admitting: Critical Care Medicine

## 2021-05-31 ENCOUNTER — Encounter: Payer: Self-pay | Admitting: Internal Medicine

## 2021-05-31 ENCOUNTER — Telehealth: Payer: Self-pay | Admitting: Critical Care Medicine

## 2021-05-31 NOTE — Telephone Encounter (Signed)
Spoke to the patient , she still does not have medicaid  She has worked with legal aid    Opal Sidles can you call her back

## 2021-05-31 NOTE — Telephone Encounter (Signed)
Call returned to patient.  She explained that she was not feeling well the week before last and was not answering her phone. She received a message from [facility] during that time. Since receiving that message, she said she left 5 messages for Nevin Bloodgood and has not heard from her. Informed her that this CM will contact [facility] to inquire about the status of referral and their attempt to contact patient. Instructed patient to call this CM back if she has not heard from [facility] by the beginning of next week.  Message sent to Novella Olive, attorney/ [facility] informing her of above noted information from patient and requesting [facility] reach out to the patient.

## 2021-06-01 ENCOUNTER — Ambulatory Visit (INDEPENDENT_AMBULATORY_CARE_PROVIDER_SITE_OTHER): Payer: Self-pay | Admitting: Licensed Clinical Social Worker

## 2021-06-01 ENCOUNTER — Other Ambulatory Visit: Payer: Self-pay

## 2021-06-01 DIAGNOSIS — F419 Anxiety disorder, unspecified: Secondary | ICD-10-CM

## 2021-06-01 DIAGNOSIS — F4310 Post-traumatic stress disorder, unspecified: Secondary | ICD-10-CM

## 2021-06-01 DIAGNOSIS — F330 Major depressive disorder, recurrent, mild: Secondary | ICD-10-CM

## 2021-06-01 DIAGNOSIS — F411 Generalized anxiety disorder: Secondary | ICD-10-CM

## 2021-06-01 HISTORY — DX: Anxiety disorder, unspecified: F41.9

## 2021-06-01 MED ORDER — ACYCLOVIR 400 MG PO TABS
400.0000 mg | ORAL_TABLET | Freq: Two times a day (BID) | ORAL | 1 refills | Status: DC
  Filled 2021-06-01 – 2021-06-09 (×2): qty 60, 30d supply, fill #0
  Filled 2021-07-11: qty 60, 30d supply, fill #1

## 2021-06-01 NOTE — Progress Notes (Signed)
Virtual Visit via Video Note   I connected with Katie Sherman on 06/01/21 at 2:00pm by video enabled telemedicine application and verified that I am speaking with the correct person using two identifiers.   I discussed the limitations, risks, security and privacy concerns of performing an evaluation and management service by video and the availability of in person appointments. I also discussed with the patient that there may be a patient responsible charge related to this service. The patient expressed understanding and agreed to proceed.   I discussed the assessment and treatment plan with the patient. The patient was provided an opportunity to ask questions and all were answered. The patient agreed with the plan and demonstrated an understanding of the instructions.   The patient was advised to call back or seek an in-person evaluation if the symptoms worsen or if the condition fails to improve as anticipated.   I provided 1 hour of non-face-to-face time during this encounter.     Shade Flood, LCSW, LCAS ______________________________ THERAPIST PROGRESS NOTE   Session Time: 2:00pm - 3:00pm     Location: Patient: Patient home Provider: [facility]   Participation Level: Active   Behavioral Response: Alert, casually dressed, anxious mood/affect   Type of Therapy:  Individual Therapy   Treatment Goals addressed: Depression and Anxiety management; Medication management; Monitoring substance use   Interventions: CBT, problem solving    Summary: Katie Sherman is a 55 year old divorced Caucasian female that presented for therapy appointment and is diagnosed with Generalized Anxiety Disorder; Major Depressive Disorder, recurrent, mild; and PTSD.        Suicidal/Homicidal: None; without intent or plan   Therapist Response: Clinician met with Katie Sherman for virtual therapy appointment and assessed for safety, sobriety, and medication compliance.  Katie Sherman presented for session on  time and was alert, oriented x5, with no evidence or self-report of active SI/HI or A/V H.  Katie Sherman reported ongoing compliance with medication and denied any use of alcohol.  She reported that she has been smoking marijuana x1-2 per day and recently met with her probation officer, who warned her against continuing this pattern since the judge could order her to take substance abuse courses through [facility].  Katie Sherman reported that this and financial consequences have made her consider cutting back, but stated "Until they give me an ultimatum I'm going to keep using it because it helps me sleep and keeps me calm".  Clinician inquired about Katie Sherman's emotional ratings today, as well as any significant changes in thoughts, feelings, or behavior since previous check-in.  Katie Sherman reported scores of 5/10 for depression, 5/10 for anxiety, 0/10 for anger/irritability.  Katie Sherman denied experiencing any panic attacks or outbursts.  She reported that one struggle has been making it to our office for therapy over the past several weeks, so she decided to opt for a virtual appointment instead, stating "I've had a lot going on".  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  Katie Sherman reported that some good news was that it appears to have shrunk.  Katie Sherman reported that she is expected to have another scan of the target area in roughly one month.  Katie Sherman reported that her primary concern at this time is deciding where to move, since she can no longer afford her apartment.  Clinician utilized a problem solving approach to assist Katie Sherman in exploring available options to live in temporarily while she improves  financial situation.  Clinician assisted Katie Sherman in running cost benefit analysis for two separate options available to her in order to help her make the most rational choice for safety and stability.  Katie Sherman actively participated in  analysis and reported that one option is more appealing to her compared to the other based upon benefits of having more space to herself, [place], and less exposure to significant triggers.  Clinician offered to teach Katie Sherman a mindfulness technique to assist with management of anxiety and reduce reliance upon marijuana as she transitions into this new environment.  Katie Sherman declined offer to add to available coping skills, stating "Maybe another time.  I'm not feeling great today".  Clinician encouraged Katie Sherman to continue prioritizing self-care as she deals with financial and medical stressors and will continue to monitor.     Plan: Meet again in 1 week.   Diagnosis: Generalized Anxiety Disorder; Major Depressive Disorder, recurrent, mild; and PTSD.   Shade Flood, [place], LCAS 06/01/21

## 2021-06-06 ENCOUNTER — Telehealth (HOSPITAL_COMMUNITY): Payer: Self-pay | Admitting: Psychiatry

## 2021-06-06 ENCOUNTER — Encounter: Payer: Self-pay | Admitting: Critical Care Medicine

## 2021-06-06 MED ORDER — OXYCODONE-ACETAMINOPHEN 10-325 MG PO TABS: 1.0000 | ORAL_TABLET | Freq: Three times a day (TID) | ORAL | 0 refills | Status: DC | PRN

## 2021-06-06 NOTE — Telephone Encounter (Signed)
Dr. Lamonte Sakai is there something that needs to be done for this patient.

## 2021-06-07 ENCOUNTER — Encounter: Payer: Self-pay | Admitting: Critical Care Medicine

## 2021-06-07 NOTE — Telephone Encounter (Signed)
Message received from Novella Olive, attorney/LANC:  Nevin Bloodgood spoke with [name] today. They are meeting tomorrow morning to get the [facility] application filed. We are also going to make a referral to our housing team to see if there is anything that they may be able to help with. Also do you think any of Katie Sherman's doctors there at [facility] would be willing to provide a statement regarding to her  limitations and inability to work; it would be helpful for her currently pending SSI claim.

## 2021-06-07 NOTE — Telephone Encounter (Signed)
Agree with Dr Bettina Gavia plan to repeat scan in 3 months. He will order and we both will follow.

## 2021-06-08 ENCOUNTER — Other Ambulatory Visit: Payer: Self-pay

## 2021-06-09 ENCOUNTER — Other Ambulatory Visit: Payer: Self-pay

## 2021-06-10 ENCOUNTER — Other Ambulatory Visit: Payer: Self-pay

## 2021-06-21 ENCOUNTER — Other Ambulatory Visit: Payer: Self-pay | Admitting: Internal Medicine

## 2021-06-21 ENCOUNTER — Other Ambulatory Visit: Payer: Self-pay

## 2021-06-21 DIAGNOSIS — I700 Atherosclerosis of aorta: Secondary | ICD-10-CM

## 2021-06-21 MED ORDER — PRAVASTATIN SODIUM 40 MG PO TABS
40.0000 mg | ORAL_TABLET | Freq: Every day | ORAL | 3 refills | Status: DC
  Filled 2021-06-21 – 2021-08-03 (×3): qty 30, 30d supply, fill #0

## 2021-06-22 ENCOUNTER — Encounter: Payer: Self-pay | Admitting: Emergency Medicine

## 2021-06-22 DIAGNOSIS — R918 Other nonspecific abnormal finding of lung field: Secondary | ICD-10-CM

## 2021-06-23 ENCOUNTER — Other Ambulatory Visit: Payer: Self-pay

## 2021-06-23 NOTE — Telephone Encounter (Signed)
RB please advise. Thanks   I'm pretty sure it's back. My neck, shoulder, chest and collar bone are hurting more. I feel it moving in my neck again and my neck tightening. I can't lift my arm up as far as I should be able to. Katie Sherman feels like it did when I had adhesive capsulitis a few years ago but it could be whatever this is. Something's going on here and I don't want to wait until February for an MRI. I want to know if it's wrapped around one of my jugular veins. I just want someone to find out what this is and do something about it.

## 2021-06-23 NOTE — Telephone Encounter (Signed)
Please let her know that I agree with getting the scan early. Please set her up for an MRI chest with and without contrast to evaluate L chest mass.  Set her up with an OV to discuss results with me. Thank you

## 2021-06-24 ENCOUNTER — Encounter: Payer: Self-pay | Admitting: Emergency Medicine

## 2021-06-24 NOTE — Telephone Encounter (Signed)
PCC"s   the MRI order that is in the system needs to be scheduled at [facility] per the patient as she does not have insurance.  thanks

## 2021-06-27 ENCOUNTER — Other Ambulatory Visit: Payer: Self-pay | Admitting: Internal Medicine

## 2021-06-27 ENCOUNTER — Encounter: Payer: Self-pay | Admitting: Emergency Medicine

## 2021-06-27 ENCOUNTER — Other Ambulatory Visit: Payer: Self-pay

## 2021-06-27 ENCOUNTER — Encounter: Payer: Self-pay | Admitting: Internal Medicine

## 2021-06-27 MED ORDER — VITAMIN D3 10 MCG (400 UNIT) PO TABS
400.0000 [IU] | ORAL_TABLET | Freq: Every day | ORAL | 1 refills | Status: DC
  Filled 2021-06-27 – 2021-08-01 (×5): qty 60, 60d supply, fill #0

## 2021-06-27 NOTE — Telephone Encounter (Signed)
I have scheduled pt's MRI and have given the info to her.  Will route message back to triage for it to be closed.

## 2021-06-27 NOTE — Telephone Encounter (Signed)
PCC's please advise. Thanks 

## 2021-06-27 NOTE — Telephone Encounter (Signed)
Patient requesting vitamin d and omega 3. Patient has appointment with PCP 2/20. Patient MRI is scheduled for 12/20 and OV with Pulmonology to discuss on 12/21.

## 2021-06-27 NOTE — Telephone Encounter (Signed)
I have scheduled this and spoken to the pt and given her info.  Will route back to triage to make sure this is closed.

## 2021-06-28 ENCOUNTER — Ambulatory Visit (HOSPITAL_COMMUNITY)
Admission: RE | Admit: 2021-06-28 | Discharge: 2021-06-28 | Disposition: A | Discharge status: Home / Self Care | Payer: Self-pay | Source: Ambulatory Visit | Attending: Emergency Medicine | Admitting: Emergency Medicine

## 2021-06-28 ENCOUNTER — Other Ambulatory Visit: Payer: Self-pay

## 2021-06-28 DIAGNOSIS — R918 Other nonspecific abnormal finding of lung field: Secondary | ICD-10-CM | POA: Insufficient documentation

## 2021-06-28 MED ORDER — GADOBUTROL 1 MMOL/ML IV SOLN
8.0000 mL | Freq: Once | INTRAVENOUS | Status: AC | PRN
  Administered 2021-06-28: 20:00:00 8 mL via INTRAVENOUS

## 2021-06-29 ENCOUNTER — Encounter: Payer: Self-pay | Admitting: Critical Care Medicine

## 2021-06-29 ENCOUNTER — Encounter: Payer: Self-pay | Admitting: Emergency Medicine

## 2021-06-29 ENCOUNTER — Ambulatory Visit: Payer: Medicaid Other | Admitting: Primary Care

## 2021-06-29 MED ORDER — OXYCODONE-ACETAMINOPHEN 10-325 MG PO TABS: 1.0000 | ORAL_TABLET | Freq: Three times a day (TID) | ORAL | 0 refills | Status: DC | PRN

## 2021-06-30 ENCOUNTER — Encounter: Payer: Self-pay | Admitting: Emergency Medicine

## 2021-06-30 ENCOUNTER — Other Ambulatory Visit: Payer: Self-pay

## 2021-06-30 ENCOUNTER — Ambulatory Visit (HOSPITAL_COMMUNITY): Payer: Self-pay | Admitting: Licensed Clinical Social Worker

## 2021-06-30 NOTE — Telephone Encounter (Signed)
Hi Pat,  I repeated the MRI early because she was having worrisome symptoms, wanted to rule out abscess or other invasive process. Everything looks better, and it's not clear to me that all of her sx are connected to the state of the lesion. Not sure what you wanted to do next with her eval and plan. Rob

## 2021-06-30 NOTE — Telephone Encounter (Signed)
Mychart message received from pt: [name] (supporting Lamonte Sakai, Rose Fillers, MD) 5 minutes ago (11:26 AM)   I don't understand. Why is the left side and back of my neck so bulged out right now and hurting so bad. I was in tears from the pain most of the day yesterday, to the point that I came real close to going to the ED. I can FEEL it in there! I feel it move, just like before! I've also felt it in my shoulder. This is all so bazaar and ridiculous     Dr. Lamonte Sakai, please advise on this for pt. Also, please advise on results of recent MRI.

## 2021-07-01 NOTE — Telephone Encounter (Signed)
Dr. Lamonte Sakai, please see new mychart messages from pt as well as images that is under review media tab of the mychart message.

## 2021-07-06 ENCOUNTER — Other Ambulatory Visit: Payer: Self-pay

## 2021-07-07 ENCOUNTER — Other Ambulatory Visit: Payer: Self-pay

## 2021-07-11 ENCOUNTER — Encounter: Payer: Self-pay | Admitting: Internal Medicine

## 2021-07-12 ENCOUNTER — Other Ambulatory Visit: Payer: Self-pay

## 2021-07-12 ENCOUNTER — Encounter: Payer: Self-pay | Admitting: Internal Medicine

## 2021-07-12 ENCOUNTER — Ambulatory Visit: Payer: Medicaid Other | Attending: Internal Medicine | Admitting: Internal Medicine

## 2021-07-12 DIAGNOSIS — R222 Localized swelling, mass and lump, trunk: Secondary | ICD-10-CM

## 2021-07-12 DIAGNOSIS — U071 COVID-19: Secondary | ICD-10-CM

## 2021-07-12 MED ORDER — ALBUTEROL SULFATE HFA 108 (90 BASE) MCG/ACT IN AERS
2.0000 | INHALATION_SPRAY | Freq: Four times a day (QID) | RESPIRATORY_TRACT | 2 refills | Status: DC | PRN
  Filled 2021-07-12 – 2021-10-15 (×3): qty 8.5, 25d supply, fill #0

## 2021-07-12 MED ORDER — MOLNUPIRAVIR EUA 200MG CAPSULE
4.0000 | ORAL_CAPSULE | Freq: Two times a day (BID) | ORAL | 0 refills | Status: AC
Start: 2021-07-12 — End: 2021-07-17
  Filled 2021-07-12: qty 40, 5d supply, fill #0

## 2021-07-12 NOTE — Progress Notes (Addendum)
Patient ID: Katie Sherman, female   DOB: 12/07/1965, 56 y.o.   MRN: [id number] Virtual Visit via Telephone Note  I connected with Katie Sherman on 07/12/2021 at 12:13 PM by telephone and verified that I am speaking with the correct person using two identifiers  Location: Patient: home Provider: office  Participants: Myself Patient   I discussed the limitations, risks, security and privacy concerns of performing an evaluation and management service by telephone and the availability of in person appointments. I also discussed with the patient that there may be a patient responsible charge related to this service. The patient expressed understanding and agreed to proceed.   History of Present Illness: Pt with hx of tob dep, lung nodules (stable on repeat CT 08/2020) GERD, HL, bipolar 1, chronic LT shoulder and Rt hip pain.  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 This is an urgent care visit for COVID infection.  Patient had sent me a MyChart message stating that she has COVID infection again and was given an urgent care visit. She reports symptoms started 3 days ago with cough, sneezing, itchy watery eyes and nasal congestion.  She initially thought it was allergy symptoms.  She did not experience loss of taste and smell.  Home COVID test done 3 days ago was positive.  She has not had any fever.  She has a little shortness of breath.  No known sick contacts.  She has not had COVID-19 vaccine series and does not plan on getting it.  She does not feel that it would be helpful to get the vaccine because she knows of those who got the vaccine and still became infected with COVID.  She had COVID infection in January of last year.  I note that she had recent repeat MRI of the chest through the pulmonologist Dr. Lamonte Sakai.  This revealed that previous lesion seen along the inferior medial margin of the medial left subclavian between the left clavicle and left first  rib is stable in size.  The aggressive appearance that was seen initially on imaging 01/2021 had substantially improved by MRI done 05/09/2021 with resolution of many of the findings.  Radiologist states that this natural history in the absence of antineoplastic therapy favors an infectious or inflammatory cause for the underlying lesion rather than malignancy.  Surveillance imaging warranted.  She had seen CT surgeon Dr. Kipp Brood 05/20/2021 for possible biopsy.  Due to improvement that was seen on MRI, he did not see anything that would be definitive for surgical biopsy and recommended repeat MRI in 3 to 6 months with follow-up visit. -Patient reports that she sometimes still gets swelling and pain in the neck.  Now has a burning sensation at the back of the neck.  She is worried she has nerve damage.  On oxycodone currently being prescribed by Dr. Joya Gaskins.  She tells me that she takes ibuprofen initially and then takes oxycodone if the pain persists.    Outpatient Encounter Medications as of 07/12/2021  Medication Sig   acyclovir (ZOVIRAX) 400 MG tablet Take 1 tablet (400 mg total) by mouth 2 (two) times daily.   albuterol (VENTOLIN HFA) 108 (90 Base) MCG/ACT inhaler Inhale 2 puffs into the lungs every 6 (six) hours as needed for wheezing or shortness of breath.   Cholecalciferol (VITAMIN D3) 10 MCG (400 UNIT) tablet Take 1 tablet (400 Units total) by mouth daily.   cyclobenzaprine (FLEXERIL) 10 MG tablet TAKE 1 TABLET (10 MG TOTAL) BY  MOUTH DAILY AS NEEDED FOR MUSCLE SPASMS.   gabapentin (NEURONTIN) 400 MG capsule Take 1 capsule (400 mg total) by mouth 2 (two) times daily.   ibuprofen (ADVIL) 600 MG tablet Take 1 tablet (600 mg total) by mouth every 8 (eight) hours as needed for moderate pain or headache.   lamoTRIgine (LAMICTAL) 100 MG tablet TAKE 3 TABLETS (300 MG TOTAL) BY MOUTH DAILY.   omeprazole (PRILOSEC) 20 MG capsule Take 1 capsule (20 mg total) by mouth daily.   oxyCODONE-acetaminophen  (PERCOCET) 10-325 MG tablet Take 1 tablet by mouth every 8 (eight) hours as needed for pain.   pravastatin (PRAVACHOL) 40 MG tablet Take 1 tablet (40 mg total) by mouth daily.   QUEtiapine (SEROQUEL) 200 MG tablet Take 1 tablet (200 mg total) by mouth at bedtime.   No facility-administered encounter medications on file as of 07/12/2021.      Observations/Objective: Patient with mild audible congestion heard.  Assessment and Plan: 1. COVID-19 virus infection -I went over criteria for self-isolation as outlined below.  I told her that I will also send these instructions to her MyChart account. Initially I told her that I would prescribe Paxlovid antiviral pill for 5 days.  However after hanging up from her I see that there is interaction of this medication with Seroquel, oxycodone and the atorvastatin.  I therefore called her back and left a message on her voicemail letting her know that I was changing it to [place] instead -advised that she should not be pregnant while taking this medication as it is contraindicated.  Should use effective form of contraception during and for 3 to 5 days after completion. -Also encouraged her to reconsider getting the COVID-19 vaccine series when she gets over this acute bout. - albuterol (VENTOLIN HFA) 108 (90 Base) MCG/ACT inhaler; Inhale 2 puffs into the lungs every 6 (six) hours as needed for wheezing or shortness of breath.  Dispense: 8 g; Refill: 2 - molnupiravir EUA (LAGEVRIO) 200 mg CAPS capsule; Take 4 capsules (800 mg total) by mouth 2 (two) times daily for 5 days.  Dispense: 40 capsule; Refill: 0  2. Mass of left chest wall I recommend that we refer her to infectious disease for evaluation and opinion from them of whether this process that started back in June of last year is or may be infectious in etiology -In regards to the burning in the posterior neck, we will discuss and evaluate further on her upcoming visit with me later this month. -Also  discussed with her stepping down the oxycodone to tramadol since appearance on MRI is better.  Discussed risks of dependence/addiction.  I recommend stepping down to tramadol but patient states that does not work for her; she was prescribed this initially by me when this first started in July.  She states she had discussed this with Dr. Joya Gaskins back in December.  I told her that in the future this needs to be stepped down. - Ambulatory referral to Infectious Disease  Criteria for self-isolation if you test positive for COVID 19 regardless of vaccine status: If you have mild symptoms that are resolving or have resolved, isolate at home for 5 days since symptoms started and continue to wear a well fitted mask when around others in the home and in public for 5 additional days after isolation is completed.  If you have a fever and/or moderate to severe symptoms, isolate for at least 10 days since the symptoms started and until you are fever free for  at least 24 hours without the use of fever reducing medications. If you tested positive and did not have symptoms, isolate for at least 5 days after your positive test.  Use over-the-counter medications for symptoms.  If you develop respiratory issues/distress, seek medical care in the emergency department.  If you must leave home or if you have to be around others, please wear a mask.  Please limit contact with immediate family members in the home, practice social distancing, frequent handwashing and clean hard surfaces touched frequently with household cleaning products.  Members of your household will also need to quarantine and be tested.   Follow Up Instructions: As previously scheduled.   I discussed the assessment and treatment plan with the patient. The patient was provided an opportunity to ask questions and all were answered. The patient agreed with the plan and demonstrated an understanding of the instructions.   The patient was advised to call  back or seek an in-person evaluation if the symptoms worsen or if the condition fails to improve as anticipated.  I  Spent 19 minutes on this telephone encounter  Karle Plumber, MD

## 2021-07-13 ENCOUNTER — Other Ambulatory Visit: Payer: Self-pay | Admitting: Internal Medicine

## 2021-07-13 ENCOUNTER — Other Ambulatory Visit (HOSPITAL_COMMUNITY): Payer: Self-pay

## 2021-07-13 ENCOUNTER — Ambulatory Visit: Payer: Medicaid Other | Admitting: Emergency Medicine

## 2021-07-14 ENCOUNTER — Other Ambulatory Visit (HOSPITAL_COMMUNITY): Payer: Self-pay

## 2021-07-14 MED ORDER — ACYCLOVIR 400 MG PO TABS
400.0000 mg | ORAL_TABLET | Freq: Two times a day (BID) | ORAL | 2 refills | Status: DC
  Filled 2021-07-14: qty 60, 30d supply, fill #0

## 2021-07-14 NOTE — Telephone Encounter (Signed)
noted 

## 2021-07-14 NOTE — Telephone Encounter (Signed)
Requested Prescriptions  Pending Prescriptions Disp Refills   acyclovir (ZOVIRAX) 400 MG tablet 60 tablet 1    Sig: Take 1 tablet (400 mg total) by mouth 2 (two) times daily.     Antimicrobials:  Antiviral Agents - Anti-Herpetic Passed - 07/13/2021 12:21 PM      Passed - Valid encounter within last 12 months    Recent Outpatient Visits          2 days ago COVID-19 virus infection   [place], MD   6 months ago Hemoptysis   [place] Elsie Stain, MD   6 months ago [place], [place], MD   6 months ago Neck pain   [place], MD   8 months ago Bilateral carpal tunnel syndrome   [place], MD      Future Appointments            In 2 weeks Ladell Pier, MD [place]   In 1 month [place], Rose Fillers, MD [facility]   In 1 month Joya Gaskins Burnett Harry, MD [place]

## 2021-07-16 ENCOUNTER — Encounter: Payer: Self-pay | Admitting: Internal Medicine

## 2021-07-19 ENCOUNTER — Ambulatory Visit: Payer: Medicaid Other | Admitting: Internal Medicine

## 2021-07-21 ENCOUNTER — Ambulatory Visit (INDEPENDENT_AMBULATORY_CARE_PROVIDER_SITE_OTHER): Payer: Self-pay | Admitting: Internal Medicine

## 2021-07-21 ENCOUNTER — Other Ambulatory Visit: Payer: Self-pay

## 2021-07-21 ENCOUNTER — Encounter: Payer: Self-pay | Admitting: Internal Medicine

## 2021-07-21 VITALS — BP 130/84 | HR 80 | Temp 97.8°F | Resp 16 | Wt 178.8 lb

## 2021-07-21 DIAGNOSIS — R222 Localized swelling, mass and lump, trunk: Secondary | ICD-10-CM

## 2021-07-21 NOTE — Progress Notes (Signed)
[facility] for Infectious Disease      Reason for Consult: chest mass    Referring Physician: Dr. Wynetta Emery    Patient ID: Katie Sherman, female    DOB: 1966-04-27, 56 y.o.   MRN: [id number]  HPI:   Here for evaluation of a mass along the inferomedial margin of the left subclavius muscle.   This was first noted on a CT of the neck in July 2022 with concern for invasive infection vs neoplasm.  She was experiencing neck fullness and hemoptysis at the time of evaluation.  She was referred to pulmonary, Dr Lamonte Sakai and saw him on 01/20/21.  Also noted some weight loss.  She underwent PET scan on 02/05/21 and has had follow up MRIs in October and December and they have noted substantial improvement in the abnormal infiltrative process.  On 02/15/21 and 03/03/21 she underwent core biopsies and no significant findings noted.  Stains and immunohistochemistries were negative.     Past Medical History:  Diagnosis Date   Bilateral carpal tunnel syndrome 03/19/2018   Cervical cancer ([place])    Cervical cancer ([place])    History of exercise stress test    ETT-Echo 5/18: normal EF, no ischemia   History of loop recorder    currently on person   Kidney stone     Prior to Admission medications   Medication Sig Start Date End Date Taking? Authorizing Provider  acyclovir (ZOVIRAX) 400 MG tablet Take 1 tablet (400 mg total) by mouth 2 (two) times daily. 07/14/21   Ladell Pier, MD  albuterol (VENTOLIN HFA) 108 (90 Base) MCG/ACT inhaler Inhale 2 puffs into the lungs every 6 (six) hours as needed for wheezing or shortness of breath. 07/12/21   Ladell Pier, MD  Cholecalciferol (VITAMIN D3) 10 MCG (400 UNIT) tablet Take 1 tablet (400 Units total) by mouth daily. 06/27/21   Ladell Pier, MD  cyclobenzaprine (FLEXERIL) 10 MG tablet TAKE 1 TABLET (10 MG TOTAL) BY MOUTH DAILY AS NEEDED FOR MUSCLE SPASMS. 01/19/21 01/19/22  Elsie Stain, MD  gabapentin (NEURONTIN) 400 MG capsule Take 1 capsule  (400 mg total) by mouth 2 (two) times daily. 05/17/21 05/17/22  Arfeen, Arlyce Harman, MD  ibuprofen (ADVIL) 600 MG tablet Take 1 tablet (600 mg total) by mouth every 8 (eight) hours as needed for moderate pain or headache. 02/18/21   Ladell Pier, MD  lamoTRIgine (LAMICTAL) 100 MG tablet TAKE 3 TABLETS (300 MG TOTAL) BY MOUTH DAILY. 05/17/21 05/17/22  Arfeen, Arlyce Harman, MD  omeprazole (PRILOSEC) 20 MG capsule Take 1 capsule (20 mg total) by mouth daily. 05/24/21   Ladell Pier, MD  oxyCODONE-acetaminophen (PERCOCET) 10-325 MG tablet Take 1 tablet by mouth every 8 (eight) hours as needed for pain. 06/29/21   Elsie Stain, MD  pravastatin (PRAVACHOL) 40 MG tablet Take 1 tablet (40 mg total) by mouth daily. 06/21/21   Ladell Pier, MD  QUEtiapine (SEROQUEL) 200 MG tablet Take 1 tablet (200 mg total) by mouth at bedtime. 05/17/21 05/17/22  Kathlee Nations, MD    No Known Allergies  Social History   Tobacco Use   Smoking status: Every Day    Packs/day: 1.00    Years: 36.00    Pack years: 36.00    Types: Cigarettes    Start date: 1983   Smokeless tobacco: Never   Tobacco comments:    Smokes 1 pack a day ARJ 01/20/21  Vaping Use   Vaping  Use: Never used  Substance Use Topics   Alcohol use: No   Drug use: No    Types: Marijuana    Comment: stopped 04/06/2017    Family History  Problem Relation Age of Onset   Heart attack Mother 70   Heart disease Father    Heart attack Father 36   Liver cancer Father    Bone cancer Other    Breast cancer Maternal Aunt        Diagnosed in 47's   Bone cancer Maternal Aunt     Review of Systems  Constitutional: negative for fevers and chills Respiratory: negative for cough or sputum Gastrointestinal: negative for nausea and diarrhea All other systems reviewed and are negative    Constitutional: in no apparent distress There were no vitals filed for this visit. EYES: anicteric Respiratory: normal respiratory effort Musculoskeletal: no  edema Skin: no rash Neuro: non-focal  Labs: Lab Results  Component Value Date   WBC 8.0 03/18/2021   HGB 12.4 03/18/2021   HCT 37.1 03/18/2021   MCV 92.8 03/18/2021   PLT 390 03/18/2021    Lab Results  Component Value Date   CREATININE 1.00 03/18/2021   BUN 13 03/18/2021   NA 141 03/18/2021   K 4.2 03/18/2021   CL 104 03/18/2021   CO2 26 03/18/2021    Lab Results  Component Value Date   ALT 8 03/18/2021   AST 13 (L) 03/18/2021   ALKPHOS 107 03/18/2021   BILITOT 0.4 03/18/2021   INR 1.0 03/03/2021     Assessment: mediastinal mass, resolving spontaneously.  Now s/p 2 biopsies and no indication of what it is and improving regardless.  No sign of infection.  No indication at this time for any treatment from ID standpoint.   She will follow up as needed.   Plan: 1)  continued observation Follow up with me as needed.

## 2021-07-26 ENCOUNTER — Other Ambulatory Visit: Payer: Self-pay | Admitting: Thoracic Surgery (Cardiothoracic Vascular Surgery)

## 2021-07-26 DIAGNOSIS — R222 Localized swelling, mass and lump, trunk: Secondary | ICD-10-CM

## 2021-07-27 ENCOUNTER — Encounter: Payer: Self-pay | Admitting: Critical Care Medicine

## 2021-07-28 ENCOUNTER — Telehealth: Payer: Self-pay | Admitting: Internal Medicine

## 2021-07-28 ENCOUNTER — Other Ambulatory Visit: Payer: Self-pay

## 2021-07-28 ENCOUNTER — Encounter: Payer: Self-pay | Admitting: Internal Medicine

## 2021-07-28 ENCOUNTER — Telehealth (HOSPITAL_COMMUNITY): Payer: Self-pay | Admitting: Licensed Clinical Social Worker

## 2021-07-28 ENCOUNTER — Ambulatory Visit (HOSPITAL_COMMUNITY): Payer: Self-pay | Admitting: Licensed Clinical Social Worker

## 2021-07-28 NOTE — Telephone Encounter (Signed)
Lesley had an in-person therapy appointment scheduled today at 3pm.  Clinician outreached Maytte at 3:10pm when she had not presented for appointment on time, but received no response, and was forced to leave a voicemail reminder.  Clinician informed front desk staff of no-show event when Kahleah did not present by 3:15pm or return call to our office.    Shade Flood, LCSW, LCAS 07/28/21

## 2021-07-28 NOTE — Telephone Encounter (Signed)
Pt was on my schedule for 11:10 a.m today as a virtual visit.  I called pt at 8:30 a.m and left a VMM suggesting that the visit be in person.  On last telephone visit with me, she indicated that she wanted to come in for general check.

## 2021-08-01 ENCOUNTER — Ambulatory Visit: Payer: Self-pay | Admitting: *Deleted

## 2021-08-01 ENCOUNTER — Other Ambulatory Visit (HOSPITAL_COMMUNITY): Payer: Self-pay

## 2021-08-01 ENCOUNTER — Other Ambulatory Visit: Payer: Self-pay

## 2021-08-01 NOTE — Telephone Encounter (Signed)
Thank you for sharing  My last Rx for pain med was 06/29/2021 and I told the patient since her chest mass had receded I would no longer Rx any further percocets

## 2021-08-01 NOTE — Telephone Encounter (Signed)
Received call from pt's friend, she did not want to give her name and number, she wanted to remain anonymous. She was able to provide pt's name and DOB and phone number to verify the pt. States she is friend and concerned that pt is abusing Percocet 10mg . Has witnessed her taking more than prescribed and also selling her medication. Witnesses pt's highs and lows, knows when she takes her Adderal and when she doesn't. Asked if spoke with pt's family with these concerns and she states her daughter has called and asked her what is going on with her mom. Daughter asked if her mom has Cancer. Caller says she wants the pt to get help but for pt not to know that this information was told to the provider. Caller advised information will be sent to provider, she verbalized understanding. Reason for Disposition  Health Information question, no triage required and triager able to answer question  Answer Assessment - Initial Assessment Questions 1. REASON FOR CALL or QUESTION: "What is your reason for calling today?" or "How can I best help you?" or "What question do you have that I can help answer?"     Feels like pt is taking meds incorrectly  Protocols used: Information Only Call - No Triage-A-AH

## 2021-08-02 ENCOUNTER — Encounter: Payer: Self-pay | Admitting: Critical Care Medicine

## 2021-08-03 ENCOUNTER — Other Ambulatory Visit: Payer: Self-pay

## 2021-08-03 ENCOUNTER — Other Ambulatory Visit (HOSPITAL_BASED_OUTPATIENT_CLINIC_OR_DEPARTMENT_OTHER): Payer: Self-pay

## 2021-08-05 ENCOUNTER — Other Ambulatory Visit: Payer: Self-pay

## 2021-08-08 ENCOUNTER — Other Ambulatory Visit: Payer: Self-pay

## 2021-08-11 ENCOUNTER — Encounter: Payer: Self-pay | Admitting: Internal Medicine

## 2021-08-11 ENCOUNTER — Encounter: Payer: Self-pay | Admitting: Critical Care Medicine

## 2021-08-12 ENCOUNTER — Encounter: Payer: Self-pay | Admitting: Emergency Medicine

## 2021-08-12 DIAGNOSIS — R222 Localized swelling, mass and lump, trunk: Secondary | ICD-10-CM

## 2021-08-12 NOTE — Telephone Encounter (Signed)
[name] (supporting Byrum, Rose Fillers, MD) 2 hours ago (1:15 PM)   Can you please send a referral for me to see Dr. Lorenso Courier at the cancer center??  Dr. Lamonte Sakai, please advise. Thanks

## 2021-08-15 ENCOUNTER — Other Ambulatory Visit: Payer: Self-pay | Admitting: Thoracic Surgery (Cardiothoracic Vascular Surgery)

## 2021-08-15 ENCOUNTER — Other Ambulatory Visit (HOSPITAL_COMMUNITY): Payer: Self-pay

## 2021-08-15 ENCOUNTER — Other Ambulatory Visit: Payer: Self-pay

## 2021-08-15 ENCOUNTER — Telehealth: Payer: Self-pay | Admitting: Internal Medicine

## 2021-08-15 DIAGNOSIS — R222 Localized swelling, mass and lump, trunk: Secondary | ICD-10-CM

## 2021-08-15 NOTE — Telephone Encounter (Signed)
-----   Message from Lincoln Brigham, PA-C sent at 08/15/2021  9:59 AM EST ----- Hello All,   I spoke to the patient today as she was concerned she might have a germ cell tumor. I clarified that there is no evidence to suggest she has a germ cell tumor and reviewed prior biopsies results.  I reassured her that if a malignancy is confirmed in the future, we would see her in clinic to discuss treatment options. Otherwise, it is best to continue to follow up with Dr. Lamonte Sakai and Dr. Kipp Brood.   Thanks, Murray Hodgkins   ----- Message ----- From: Orson Slick, MD Sent: 08/15/2021   8:01 AM EST To: Ladell Pier, MD, Lincoln Brigham, PA-C  Dr. Wynetta Emery,  She currently does not have any evidence of a hematology or oncology disorder, so unfortunately I don't think there is anything further we have to offer at this time.   Murray Hodgkins- would you be able to call the patient to see if there is anything we can do to help? If there are new or concerning issues we can consider bringing her back to clinic.   Colan Neptune  ----- Message ----- From: Ladell Pier, MD Sent: 08/13/2021   7:29 PM EST To: Orson Slick, MD, Lincoln Brigham, PA-C  This patient sent me a Mychart message requesting that I get her in with Dr. Lorenso Courier again.  She thinks she has figured out what is causing her condition.  She is very frustrated about not having a firm diagnosis.  She has an upcoming appointment with Dr. Lamonte Sakai and Dr. Kipp Brood again this month.  Would you be willing to see her?

## 2021-08-15 NOTE — Telephone Encounter (Signed)
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 Lamonte Sakai.

## 2021-08-16 ENCOUNTER — Other Ambulatory Visit: Payer: Self-pay

## 2021-08-16 ENCOUNTER — Encounter (HOSPITAL_COMMUNITY): Payer: Self-pay | Admitting: Psychiatry

## 2021-08-16 ENCOUNTER — Telehealth (HOSPITAL_BASED_OUTPATIENT_CLINIC_OR_DEPARTMENT_OTHER): Payer: Self-pay | Admitting: Psychiatry

## 2021-08-16 DIAGNOSIS — F319 Bipolar disorder, unspecified: Secondary | ICD-10-CM

## 2021-08-16 MED ORDER — LAMOTRIGINE 100 MG PO TABS
ORAL_TABLET | ORAL | 1 refills | Status: DC
  Filled 2021-08-16: qty 90, fill #0

## 2021-08-16 MED ORDER — GABAPENTIN 400 MG PO CAPS: 400.0000 mg | ORAL_CAPSULE | Freq: Two times a day (BID) | ORAL | 1 refills | Status: DC

## 2021-08-16 MED ORDER — QUETIAPINE FUMARATE 200 MG PO TABS
200.0000 mg | ORAL_TABLET | Freq: Every day | ORAL | 1 refills | Status: DC
  Filled 2021-08-16: qty 30, 30d supply, fill #0

## 2021-08-16 NOTE — Telephone Encounter (Signed)
Please as the patient to let me know about any other testing or studies that have led her in direction of this diagnosis.   Also please go ahead and refer to Dr Lorenso Courier in oncology > dx is chest mass.   Thank you
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
## 2021-08-17 ENCOUNTER — Telehealth: Payer: Self-pay | Admitting: *Deleted

## 2021-08-17 ENCOUNTER — Other Ambulatory Visit: Payer: Self-pay

## 2021-08-17 ENCOUNTER — Encounter: Payer: Self-pay | Admitting: Emergency Medicine

## 2021-08-17 NOTE — Telephone Encounter (Signed)
I spoke with Katie Sherman this evening regarding her care plan moving forward.  I confirmed her upcoming appts with Dr. Lamonte Sakai and Dr. Kipp Brood.  Pt stated she canceled her appt with Dr. Lamonte Sakai saying that she wanted to wait and get results from her MRI that Dr. Kipp Brood ordered.  She mentioned that she was going to cancel the appt with you, Dr. Joya Gaskins.  I stated that it is necessary for her to keep this appointment because care would not be possible virtually or via MyChart since she had not been in person since last June. She confirmed that she would come and understood.  I also informed patient that going forward all care would be provided by Dr. Joya Gaskins and asked that she forward all medical correspondence to Dr. Joya Gaskins. She stated that she understood. PCP changed in Epic.  She asked if Dr. Joya Gaskins would give her the pain meds back.  I stated that I could not answer that question and that she could have that conversation during her in person visit on 09/05/21.  She understood.  Patient was pleasant, satisfied with plan and thankful for the conversation and call back.

## 2021-08-17 NOTE — Telephone Encounter (Signed)
Noted  

## 2021-08-18 ENCOUNTER — Encounter: Payer: Self-pay | Admitting: Critical Care Medicine

## 2021-08-18 ENCOUNTER — Ambulatory Visit: Payer: Medicaid Other | Admitting: Emergency Medicine

## 2021-08-18 ENCOUNTER — Telehealth: Payer: Self-pay | Admitting: Critical Care Medicine

## 2021-08-18 ENCOUNTER — Other Ambulatory Visit (HOSPITAL_COMMUNITY): Payer: Self-pay

## 2021-08-18 DIAGNOSIS — F319 Bipolar disorder, unspecified: Secondary | ICD-10-CM

## 2021-08-18 DIAGNOSIS — K219 Gastro-esophageal reflux disease without esophagitis: Secondary | ICD-10-CM

## 2021-08-18 MED ORDER — LAMOTRIGINE 100 MG PO TABS
300.0000 mg | ORAL_TABLET | Freq: Every day | ORAL | 1 refills | Status: DC
  Filled 2021-08-18: qty 90, fill #0
  Filled 2021-09-09: qty 90, 30d supply, fill #0

## 2021-08-18 MED ORDER — OXYCODONE-ACETAMINOPHEN 10-325 MG PO TABS
1.0000 | ORAL_TABLET | Freq: Three times a day (TID) | ORAL | 0 refills | Status: DC | PRN
  Filled 2021-08-18: qty 70, 24d supply, fill #0

## 2021-08-18 MED ORDER — QUETIAPINE FUMARATE 200 MG PO TABS
200.0000 mg | ORAL_TABLET | Freq: Every day | ORAL | 1 refills | Status: DC
  Filled 2021-08-18: qty 30, 30d supply, fill #0

## 2021-08-18 MED ORDER — GABAPENTIN 400 MG PO CAPS
400.0000 mg | ORAL_CAPSULE | Freq: Two times a day (BID) | ORAL | 1 refills | Status: DC
  Filled 2021-08-18 (×2): qty 60, 30d supply, fill #0

## 2021-08-18 MED ORDER — OMEPRAZOLE 20 MG PO CPDR
20.0000 mg | DELAYED_RELEASE_CAPSULE | Freq: Every day | ORAL | 1 refills | Status: DC
  Filled 2021-08-18: qty 30, 30d supply, fill #0
  Filled 2021-08-18 (×2): qty 90, 90d supply, fill #0
  Filled 2021-09-23: qty 30, 30d supply, fill #1
  Filled 2021-10-22: qty 30, 30d supply, fill #2
  Filled 2021-11-27: qty 30, 30d supply, fill #3
  Filled 2021-12-16: qty 30, 30d supply, fill #4
  Filled 2022-01-10: qty 30, 30d supply, fill #5

## 2021-08-18 MED ORDER — ACYCLOVIR 400 MG PO TABS
400.0000 mg | ORAL_TABLET | Freq: Two times a day (BID) | ORAL | 2 refills | Status: DC
  Filled 2021-08-18: qty 60, 30d supply, fill #0

## 2021-08-18 MED ORDER — PRAVASTATIN SODIUM 40 MG PO TABS
40.0000 mg | ORAL_TABLET | Freq: Every day | ORAL | 3 refills | Status: DC
  Filled 2021-08-18: qty 30, 30d supply, fill #0

## 2021-08-18 MED ORDER — IBUPROFEN 600 MG PO TABS
600.0000 mg | ORAL_TABLET | Freq: Three times a day (TID) | ORAL | 3 refills | Status: DC | PRN
Start: 2021-08-18 — End: 2021-09-05
  Filled 2021-08-18: qty 60, 20d supply, fill #0

## 2021-08-18 NOTE — Telephone Encounter (Signed)
I reached out and called this patient today and discussed with her that I would be becoming her primary care provider.  I answered all her questions.  I went over all of her test results that she has recently had.  She has been encouraged to keep her appointment with the thoracic surgeon and keep the MRI study of her chest.  She points to pain behind the left clavicle which is where the imaging shows the disease is currently present.  Because she still having considerable pain and since I will be seeing her now and having direct examinations over I told her we would have her sign a pain contract she would have a urine drug screen at the next visit and she would have her drug database checked I did check it today there are no other prescribers.  I gave her #70 Percocet 10/325 to take every 8 hours as needed she is to interpose ibuprofen with this.  Patient tells me Katie Sherman apply for Medicaid has not been approved.  She needs financial assistance she cannot afford any of her medications.  She is living with a friend now her car is broken down and she no longer has housing.  I offered and she agreed to have all her prescriptions transferred to [facility] outpatient pharmacy under the [place]  She will come to see me February 27

## 2021-08-19 ENCOUNTER — Other Ambulatory Visit: Payer: Self-pay

## 2021-08-23 ENCOUNTER — Ambulatory Visit: Payer: Self-pay

## 2021-08-23 NOTE — Telephone Encounter (Addendum)
°  Chief Complaint: throat swelling Symptoms: throat swelling, painful Frequency: 30 mins ago Pertinent Negatives: NA Disposition: [] ED /[] Urgent Care (no appt availability in office) / [] Appointment(In office/virtual)/ []  [facility]/ [] Home Care/ [x] Refused Recommended Disposition /[]  Mobile Bus/ []  Follow-up with PCP Additional Notes: Pt states she needs to speak to Dr. Joya Gaskins Immediately that he knows her condition and it is life threatening and isn't going to ED because they'll just make her sit. Advised her I will send a message and see if I can get a call back to her and if not I would call her back.    Reason for Disposition  SEVERE (e.g., excruciating) throat pain  Answer Assessment - Initial Assessment Questions 1. ONSET: "When did the throat start hurting?" (Hours or days ago)      today 2. SEVERITY: "How bad is the sore throat?" (Scale 1-10; mild, moderate or severe)   - MILD (1-3):  doesn't interfere with eating or normal activities   - MODERATE (4-7): interferes with eating some solids and normal activities   - SEVERE (8-10):  excruciating pain, interferes with most normal activities   - SEVERE DYSPHAGIA: can't swallow liquids, drooling     Hadn't tried to eat or drink  4.  VIRAL SYMPTOMS: "Are there any symptoms of a cold, such as a runny nose, cough, hoarse voice or red eyes?"      *No Answer* 5. FEVER: "Do you have a fever?" If Yes, ask: "What is your temperature, how was it measured, and when did it start?"     *No Answer* 6. PUS ON THE TONSILS: "Is there pus on the tonsils in the back of your throat?"     *No Answer* 7. OTHER SYMPTOMS: "Do you have any other symptoms?" (e.g., difficulty breathing, headache, rash)     States pain is near hiatal hernia  Protocols used: Sore Throat-A-AH

## 2021-08-24 ENCOUNTER — Encounter: Payer: Self-pay | Admitting: Critical Care Medicine

## 2021-08-24 ENCOUNTER — Other Ambulatory Visit: Payer: Self-pay

## 2021-08-24 DIAGNOSIS — R221 Localized swelling, mass and lump, neck: Secondary | ICD-10-CM

## 2021-08-24 DIAGNOSIS — R222 Localized swelling, mass and lump, trunk: Secondary | ICD-10-CM

## 2021-08-24 DIAGNOSIS — R1313 Dysphagia, pharyngeal phase: Secondary | ICD-10-CM

## 2021-08-24 NOTE — Telephone Encounter (Signed)
Pt requesting to speak with you

## 2021-08-24 NOTE — Telephone Encounter (Signed)
I spoke to the patient this am , see other note and order for esophagram

## 2021-08-24 NOTE — Telephone Encounter (Signed)
Pt with more swallow dysfunction , will order barium swallow esophagram Harder to cough up phlegm.  Still smokes.  She has MRI of chest 08/25/21 She has f/u with [facility] this week after MRI  Pt told to go to ED if she dramatically worsens

## 2021-08-25 ENCOUNTER — Encounter: Payer: Self-pay | Admitting: Critical Care Medicine

## 2021-08-25 ENCOUNTER — Other Ambulatory Visit: Payer: Self-pay

## 2021-08-25 ENCOUNTER — Ambulatory Visit (HOSPITAL_COMMUNITY)
Admission: RE | Admit: 2021-08-25 | Discharge: 2021-08-25 | Disposition: A | Discharge status: Home / Self Care | Payer: Self-pay | Source: Ambulatory Visit | Attending: Thoracic Surgery (Cardiothoracic Vascular Surgery) | Admitting: Thoracic Surgery (Cardiothoracic Vascular Surgery)

## 2021-08-25 DIAGNOSIS — R222 Localized swelling, mass and lump, trunk: Secondary | ICD-10-CM | POA: Insufficient documentation

## 2021-08-25 MED ORDER — GADOBUTROL 1 MMOL/ML IV SOLN
8.0000 mL | Freq: Once | INTRAVENOUS | Status: AC | PRN
  Administered 2021-08-25: 8 mL via INTRAVENOUS

## 2021-08-25 NOTE — Telephone Encounter (Signed)
I spoke to the pt  she is getting MRI now

## 2021-08-25 NOTE — Telephone Encounter (Signed)
Made appointment for swallow test, called pt to let her know and she said she didn't need it anymore and is requesting to speak with you once more. Per pt I will cancel appointment.

## 2021-08-26 ENCOUNTER — Encounter: Payer: Self-pay | Admitting: Critical Care Medicine

## 2021-08-26 ENCOUNTER — Ambulatory Visit (INDEPENDENT_AMBULATORY_CARE_PROVIDER_SITE_OTHER): Payer: Self-pay | Admitting: Thoracic Surgery (Cardiothoracic Vascular Surgery)

## 2021-08-26 ENCOUNTER — Telehealth: Payer: Self-pay | Admitting: Critical Care Medicine

## 2021-08-26 DIAGNOSIS — R222 Localized swelling, mass and lump, trunk: Secondary | ICD-10-CM

## 2021-08-26 NOTE — Telephone Encounter (Signed)
I attempted to call the patient to inform her about her MRI results. Note there was a phone virtual visit with Dr Kipp Brood who spoke to the patient around 2pm today but due to high ED volume the MRI had yet to read at that point. I have received two very long MyChart messages from the patient this afternoon expressing her frustrations     I spoke to Dr Camie Patience, Chief of Radiology [facility] on the results  He and I reviewed all prior imaging to date.  The process appears to have started behind the sternoclavicular joint and first rib at the cartilaginous junction posteriorly.  It has significantly regressed and all the reactive mediastinal cervical and axillary adenopathy has disappeared   There is no cervical spine pathology.  The interna Jugular is patent .  As this was a combined MRI MRA study all great vessels to and from the heart are NORMAL  This does NOT represent Cancer    DR Lin Landsman had an interesting hypothesis, what if this were from some type of costochondritis that resulted in cartilaginous fracture and bleeding in the past of summer 2022.   The pt steadfastly denies trauma or IVDU .     In any case it is not cancer but likely inflammatory.  I have tried to call the patient multiple times.   She is living with a friend off [address].    I have left two phone msg's on the patients cell  there is no answer,  I have tried to call at least 8 times between 4p and now to no avail  I am copying this phone note with Drs Lamonte Sakai and Kipp Brood.

## 2021-08-26 NOTE — Progress Notes (Addendum)
°   °  [address]       [place] [address]             [phone]       Patient: Home Provider: Office Consent for Telemedicine visit obtained.  Todays visit was completed via a real-time telehealth (see specific modality noted below). The patient/authorized person provided oral consent at the time of the visit to engage in a telemedicine encounter with the present provider at [facility]. The patient/authorized person was informed of the potential benefits, limitations, and risks of telemedicine. The patient/authorized person expressed understanding that the laws that protect confidentiality also apply to telemedicine. The patient/authorized person acknowledged understanding that telemedicine does not provide emergency services and that he or she would need to call 911 or proceed to the nearest hospital for help if such a need arose.   Total time spent in the clinical discussion 20 minutes.  Telehealth Modality: Phone visit (audio only)  I had a telephone visit with Katie Sherman.  She was previously being evaluated for a chest wall abnormality.  Her last MRI showed that this likely was an inflammatory versus infectious process.  Over the last several months she has done well up until the day prior to her MRI where she endorsed having some swelling over the area as well as some increased pain.  That subsequently subsided the day after the MRI, however she continues to note some swelling in the area.  Her MRI has not yet been resulted.  I will call her next week to discuss the results.   MRI resulted.  Continued improvement, likely representing resolving infection and inflammation.  No need for further follow-up.  Daquon Greenleaf Bary Leriche

## 2021-08-29 ENCOUNTER — Ambulatory Visit: Payer: Medicaid Other | Admitting: Internal Medicine

## 2021-08-30 ENCOUNTER — Ambulatory Visit (HOSPITAL_COMMUNITY): Payer: Medicaid Other

## 2021-08-30 NOTE — Telephone Encounter (Signed)
Thank you very much Dr Joya Gaskins

## 2021-09-02 ENCOUNTER — Telehealth: Payer: Medicaid Other | Admitting: Thoracic Surgery (Cardiothoracic Vascular Surgery)

## 2021-09-03 NOTE — Progress Notes (Signed)
Established Patient Office Visit  Subjective:  Patient ID: Katie Sherman, female    DOB: June 15, 1966  Age: 56 y.o. MRN: [id number]  CC: Chronic pain in the left side of the neck and clavicle  HPI Katie Sherman presents for primary care to establish and follow-up of chronic condition of the chest and neck. Below are series of the previous documentations I have on this patient when I have seen her 1 time only in July 2022 for pulmonary consultation by her patient's primary care provider Dr. Wynetta Emery.  Subsequent to this the patient was cared for by pulmonary medicine Dr. Lamonte Sakai and thoracic surgery Dr. Kipp Brood and was seen 1 time by infectious disease Dr. Linus Salmons.  01/11/21 This a 56 year old female referred for hemoptysis difficulty swallowing and neck fullness, neck pain and supraclavicular fullness bilaterally of 4 weeks duration.  The patient's primary care provider is Dr. Wynetta Emery.  The patient's hemoptysis is pure blood with clots not mixed with mucus.  She had an episode of minimal hemoptysis a year ago last summer in 2021.  This went away she then developed COVID to beginning portion of 2022 recovered from this.  Patient began having neck swelling and pain 4 weeks ago and presented for evaluations both in the clinic in the emergency room.  Evaluations were not revealing.   At that time she was not having hemoptysis but over the past week she has had increased coughing up of blood combination with progressive swelling in the neck in the supraclavicular areas.  She is also having difficulty with swallowing as well.  Patient does smoke a pack a day of cigarettes and is under a lot of stress unable to reduce her tobacco intake.  She does have some dyspnea on exertion but no wheezing.  She is never been diagnosed with pneumonia.  She did have a CT of the chest in February of this year showing some patchy basilar groundglass changes.  She has also chronic subpleural nodules which have been unchanged  compared to previous exam in 2021 versus February 2022.  The patient's been out of work the last 2 weeks because of the difficulty she is now experiencing.   Patient's past medical history significant for cervical cancer in her self and family history of breast cancer.  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   Note ED visit 01/01/21 Katie Sherman is a 56 y.o. female with past medical history of tobacco use, cervical cancer.  Patient presents to the emergency department with a chief complaint of swelling and tenderness to her neck.  Patient reports that her symptoms started on Wednesday and were located on the left side of her neck.  Patient also endorsed that she was having some difficulty and pain when swallowing liquids but "only on the left side of my throat."  She reports that Thursday morning swelling and pain wear to both sides of her throat.  Reports progressive worsening of symptoms on Friday.  Patient reports that she tried to take your prescribed Acacian Friday morning but it "felt like my throat was closing up.".  States that it felt like one of her pills had become stuck in her throat.  Patient reports that she "stuck a wooden spoon down my throat," in an attempt to push the pill further along.  Patient reports that she had an episode of vomiting after this.     At present patient reports that pain is only located to the right side of her neck.  Patient rates her pain 10/10 on the pain scale.  Patient reports that pain is worse with movement and touch.  Patient reports that she has had some relief with ibuprofen earlier in the week but has not taken any today.    08/26/21 phone note I attempted to call the patient to inform her about her MRI results. Note there was a phone virtual  visit with Dr Kipp Brood who spoke to the patient around 2pm today but due to high ED volume the MRI had yet to read at that point. I have received two very long MyChart messages from the patient this afternoon expressing her frustrations        I spoke to Dr Camie Patience, Chief of Radiology [facility] on the results  He and I reviewed all prior imaging to date.   The process appears to have started behind the sternoclavicular joint and first rib at the cartilaginous junction posteriorly.  It has significantly regressed and all the reactive mediastinal cervical and axillary adenopathy has disappeared   There is no cervical spine pathology.  The interna Jugular is patent .  As this was a combined MRI MRA study all great vessels to and from the heart are NORMAL   This does NOT represent Cancer    DR Lin Landsman had an interesting hypothesis, what if this were from some type of costochondritis that resulted in cartilaginous fracture and bleeding in the past of summer 2022.   The pt steadfastly denies trauma or IVDU .      In any case it is not cancer but likely inflammatory.   I have tried to call the patient multiple times.   She is living with a friend off [address].     I have left two phone msg's on the patients cell  there is no answer,  I have tried to call at least 8 times between 4p and now to no avail   I am copying this phone note with Drs Lamonte Sakai and Kipp Brood.  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  She is well aware it at about the current MRI as she had a visit with Dr. Kipp Brood over the phone he explained the current findings to her.  She was not available when I tried to call her 2 weeks ago.  She agrees today to become primary care under my coverage as well as follow-up here for her pulmonary status.  She states she is not having swallowing difficulty as she was before but has a burning sensation  on the back of her neck she has bad dentition.  She feels a squeezing sensation in the chest neck area and pain in the left clavicle.  She also notes a slight increase swelling in the right side of the neck as well.  She has left-sided shoulder pain as well.  She states she did fall early in the spring down her stairs and tried to grab with her left hand she does not recall specifically injuring her shoulder at that visit.  Patient subsequent developed acute onset of the symptoms in the neck in June 2022.  She then saw her primary care provider who then referred her to me.  See my July 2022 note.  At that point we ordered imaging of the neck with a CT scan.  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  And other findings of mass effect behind the left clavicle and also involvement of the tip of the first rib on the left and mediastinal adenopathy and mass effect under the chest wall.  Serial imagings over a period of time including PET scan and 2 other MRIs showed the mass lesion is reducing in size and the most recent MRI in February of this year showed marked reduction of all areas of involvement.  Patient denies any infectious disease history she denies any IV drug use.  No history of tuberculosis.  No fever chills or sweats.  Her weight is down somewhat.  She has not had any systemic blood draws.  Patient denies any further hemoptysis this only occurred in the summer 2022.  She is requesting a refill on a nitro drip patch which helped with bursitis of the right hip which she got from orthopedics in 2019.  She also wants her acyclovir refilled pill.  She would like refills on her chronic pain medicine.  She convened today with me today to see if any additional imaging studies can be performed and other evaluations be performed for this patient. Past Medical History:  Diagnosis Date   Bilateral carpal tunnel syndrome 03/19/2018   Cervical cancer ([place])    Cervical cancer ([place])     History of exercise stress test    ETT-Echo 5/18: normal EF, no ischemia   History of loop recorder    currently on person   Kidney stone     Past Surgical History:  Procedure Laterality Date   CERVICAL CONE BIOPSY     TUBAL LIGATION      Family History  Problem Relation Age of Onset   Heart attack Mother 25   Heart disease Father    Heart attack Father 53   Liver cancer Father    Bone cancer Other    Breast cancer Maternal Aunt        Diagnosed in 63's   Bone cancer Maternal Aunt     Social History   Socioeconomic History   Marital status: Widowed    Spouse name: Not on file   Number of children: Not on file   Years of education: Not on file   Highest education level: Not on file  Occupational History   Occupation: unemployed  Tobacco Use   Smoking status: Every Day    Packs/day: 0.50    Years: 36.00    Pack years: 18.00    Types: Cigarettes    Start date: 41   Smokeless tobacco: Never   Tobacco comments:    Smokes 1 pack a day ARJ 01/20/21  Vaping Use   Vaping Use: Never used  Substance and Sexual Activity   Alcohol use: No   Drug use: Yes    Types: Marijuana    Comment: stopped 04/06/2017   Sexual activity: Not Currently    Birth control/protection: None  Other Topics Concern   Not on file  Social History Narrative   Prior traffic control specialist - unemployed now   Widow   5 children, 10 grandchildren   Moved here from [place] 2013   Social Determinants of Health   Financial Resource Strain: Not on file  Food Insecurity: Not on file  Transportation Needs: Not on file  Physical Activity: Not on file  Stress: Not on file  Social Connections:  Not on file  Intimate Partner Violence: Not on file    Outpatient Medications Prior to Visit  Medication Sig Dispense Refill   albuterol (VENTOLIN HFA) 108 (90 Base) MCG/ACT inhaler Inhale 2 puffs into the lungs every 6 (six) hours as needed for wheezing or shortness of breath. 8.5 g 2   lamoTRIgine  (LAMICTAL) 100 MG tablet Take 3 tablets (300 mg total) by mouth daily. 90 tablet 1   omeprazole (PRILOSEC) 20 MG capsule Take 1 capsule (20 mg total) by mouth daily. 90 capsule 1   QUEtiapine (SEROQUEL) 200 MG tablet Take 1 tablet (200 mg total) by mouth at bedtime. 30 tablet 1   acyclovir (ZOVIRAX) 400 MG tablet Take 1 tablet (400 mg total) by mouth 2 (two) times daily. 60 tablet 2   cyclobenzaprine (FLEXERIL) 10 MG tablet TAKE 1 TABLET (10 MG TOTAL) BY MOUTH DAILY AS NEEDED FOR MUSCLE SPASMS. 90 tablet 2   gabapentin (NEURONTIN) 400 MG capsule Take 1 capsule (400 mg total) by mouth 2 (two) times daily. 60 capsule 1   ibuprofen (ADVIL) 600 MG tablet Take 1 tablet (600 mg total) by mouth every 8 (eight) hours as needed for moderate pain or headache. 60 tablet 3   oxyCODONE-acetaminophen (PERCOCET) 10-325 MG tablet Take 1 tablet by mouth every 8 (eight) hours as needed for pain. 70 tablet 0   pravastatin (PRAVACHOL) 40 MG tablet Take 1 tablet (40 mg total) by mouth daily. 30 tablet 3   No facility-administered medications prior to visit.    No Known Allergies  ROS Review of Systems  Constitutional:  Positive for fatigue and unexpected weight change. Negative for chills, diaphoresis and fever.  HENT:  Positive for trouble swallowing. Negative for ear pain, postnasal drip, rhinorrhea, sinus pressure, sore throat and voice change.   Eyes: Negative.   Respiratory: Negative.  Negative for apnea, cough, choking, chest tightness, shortness of breath, wheezing and stridor.        No further hemoptysis  Cardiovascular:  Positive for chest pain. Negative for palpitations and leg swelling.  Gastrointestinal: Negative.  Negative for abdominal distention, abdominal pain, nausea and vomiting.  Genitourinary: Negative.   Musculoskeletal:  Positive for neck pain and neck stiffness. Negative for arthralgias and myalgias.  Skin: Negative.  Negative for rash.  Allergic/Immunologic: Negative.  Negative for  environmental allergies and food allergies.  Neurological: Negative.  Negative for dizziness, syncope, weakness and headaches.  Hematological: Negative.  Negative for adenopathy. Does not bruise/bleed easily.  Psychiatric/Behavioral:  Positive for dysphoric mood. Negative for agitation and sleep disturbance. The patient is not nervous/anxious.      Objective:    Physical Exam Vitals reviewed.  Constitutional:      Appearance: Normal appearance. She is well-developed. She is not diaphoretic.  HENT:     Head: Normocephalic and atraumatic.     Nose: Nose normal. No nasal deformity, septal deviation, mucosal edema or rhinorrhea.     Right Sinus: No maxillary sinus tenderness or frontal sinus tenderness.     Left Sinus: No maxillary sinus tenderness or frontal sinus tenderness.     Mouth/Throat:     Mouth: Mucous membranes are moist.     Pharynx: Oropharynx is clear. No oropharyngeal exudate.     Comments: Poor dentition Eyes:     General: No scleral icterus.    Conjunctiva/sclera: Conjunctivae normal.     Pupils: Pupils are equal, round, and reactive to light.  Neck:     Thyroid: No thyromegaly.  Vascular: No carotid bruit or JVD.     Trachea: Trachea normal. No tracheal tenderness or tracheal deviation.  Cardiovascular:     Rate and Rhythm: Normal rate and regular rhythm.     Chest Wall: PMI is not displaced.     Pulses: Normal pulses. No decreased pulses.     Heart sounds: Normal heart sounds, S1 normal and S2 normal. Heart sounds not distant. No murmur heard. No systolic murmur is present.  No diastolic murmur is present.    No friction rub. No gallop. No S3 or S4 sounds.  Pulmonary:     Effort: Pulmonary effort is normal. No tachypnea, accessory muscle usage or respiratory distress.     Breath sounds: Normal breath sounds. No stridor. No decreased breath sounds, wheezing, rhonchi or rales.  Chest:     Chest wall: Deformity, swelling and tenderness present. No mass,  lacerations, crepitus or edema. There is no dullness to percussion.  Breasts:    Right: Normal.     Left: Normal.       Comments: Fullness in the left neck area and extreme tenderness in the sternoclavicular joint and also tenderness that continues on the clavicle towards the shoulder, significant pain when I adduct the shoulder Abdominal:     General: Bowel sounds are normal. There is no distension.     Palpations: Abdomen is soft. Abdomen is not rigid.     Tenderness: There is no abdominal tenderness. There is no guarding or rebound.  Musculoskeletal:        General: Normal range of motion.     Cervical back: Normal range of motion and neck supple. No edema, erythema or rigidity. No muscular tenderness. Normal range of motion.  Lymphadenopathy:     Head:     Right side of head: No submental or submandibular adenopathy.     Left side of head: No submental or submandibular adenopathy.     Cervical: No cervical adenopathy.     Upper Body:     Left upper body: Supraclavicular adenopathy present. No axillary or pectoral adenopathy.  Skin:    General: Skin is warm and dry.     Coloration: Skin is not pale.     Findings: No rash.     Nails: There is no clubbing.  Neurological:     Mental Status: She is alert and oriented to person, place, and time.     Sensory: No sensory deficit.  Psychiatric:        Mood and Affect: Mood normal.        Speech: Speech normal.        Behavior: Behavior normal.    BP 116/82    Pulse 75    Wt 168 lb 12.8 oz (76.6 kg)    LMP 04/29/2012    SpO2 100%    BMI 26.44 kg/m  Wt Readings from Last 3 Encounters:  09/05/21 168 lb 12.8 oz (76.6 kg)  07/21/21 178 lb 12.8 oz (81.1 kg)  05/20/21 170 lb (77.1 kg)     Health Maintenance Due  Topic Date Due   COLONOSCOPY (Pts 45-75yr Insurance coverage will need to be confirmed)  Never done   MAMMOGRAM  Never done    There are no preventive care reminders to display for this patient.  Lab Results   Component Value Date   TSH 1.260 01/01/2020   Lab Results  Component Value Date   WBC 8.0 03/18/2021   HGB 12.4 03/18/2021   HCT 37.1 03/18/2021  MCV 92.8 03/18/2021   PLT 390 03/18/2021   Lab Results  Component Value Date   NA 141 03/18/2021   K 4.2 03/18/2021   CO2 26 03/18/2021   GLUCOSE 90 03/18/2021   BUN 13 03/18/2021   CREATININE 1.00 03/18/2021   BILITOT 0.4 03/18/2021   ALKPHOS 107 03/18/2021   AST 13 (L) 03/18/2021   ALT 8 03/18/2021   PROT 7.7 03/18/2021   ALBUMIN 4.0 03/18/2021   CALCIUM 9.4 03/18/2021   ANIONGAP 11 03/18/2021   EGFR 73 01/11/2021   Lab Results  Component Value Date   CHOL 252 (H) 12/07/2020   Lab Results  Component Value Date   HDL 62 12/07/2020   Lab Results  Component Value Date   LDLCALC 152 (H) 12/07/2020   Lab Results  Component Value Date   TRIG 212 (H) 12/07/2020   Lab Results  Component Value Date   CHOLHDL 4.1 12/07/2020   Lab Results  Component Value Date   HGBA1C 5.3 01/01/2020  CT Neck 01/19/21 EXAM: CT NECK WITH CONTRAST   TECHNIQUE: Multidetector CT imaging of the neck was performed using the standard protocol following the bolus administration of intravenous contrast.   CONTRAST:  166m OMNIPAQUE IOHEXOL 350 MG/ML SOLN   COMPARISON:  08/19/2020   FINDINGS: Pharynx and larynx: No evidence of primary mass or inflammation along the mucosa   Salivary glands: No inflammation, mass, or stone.   Thyroid: Normal.   Lymph nodes: Bilateral jugular chain adenopathy. A right level 4 node measures 19 mm maximum on axial images. There is an infiltrative process at the left thoracic inlet and upper mediastinum, with sheet like soft tissue thickening posterior to the jugular notch and manubrium. Just anterior to the left IJ brachiocephalic confluence is a cystic appearing component measuring 12 mm. Soft tissue density extends anterior to the left first costochondral junction. There is reticulation and  adjacent left upper lobe with partially covered layering left pleural effusion.   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   Limited intracranial: Negative   Visualized orbits: Negative   Mastoids and visualized paranasal sinuses: Clear   Skeleton: No erosion of ribs or joint spaces adjacent to the inflammation, although the left first costochondral junction is wider than the right.   Upper chest: Reticular opacity in the subpleural left upper lobe adjacent to the chest wall process.   These results will be called to the ordering clinician or representative by the Radiologist Assistant, and communication documented in the PACS or [employer]   IMPRESSION: Infiltrative process centered on the left chest wall, especially at left first costochondral junction, with regional adenopathy , mediastinal fat infiltration, and apical pulmonary opacity. Atypical infection or malignancy are both possible.   MRI 05/2021 FINDINGS: Along the inferomedial margin of the left subclavius muscle for example on image 26 of series 24 and also shown on image 26 of series 18, we demonstrate a somewhat infiltrative 2.7 by 2.2 by 2.6 cm (volume = 8.1 cm^3) process. When I compare back to prior exams, the examinations of 01/18/2021 showed a substantially greater amount of abnormal enhancement in the retrosternal region, anterior mediastinum, and in the anterior left upper lobe adjacent to this lesion. By the time of the 02/03/2021 PET-CT, there was new bony destruction of the left anterior first rib but was not present on the earlier 01/27/2021 examinations. On today's exam there is substantial reduction in the overall previously enhancing soft tissue both  in the subclavicular and subpectoral region and in the mediastinum, with anterior mediastinal and retrosternal involvement essentially resolved.   Moreover, the previous right level IV adenopathy  in the lower neck shown on prior PET-CT has resolved. Please note that today's examination was not a diagnostic CT of the neck but does include the region of the prior enlarged right level IV lymph nodes. Prior AP window lymph node previously 1.1 cm in short axis diameter, currently 0.4 cm in short axis on image 46 series 17.   There is some accentuated enhancement medially in the left subclavian small cell compared to right for example on image 25 of series 24 which may be related to the underlying process.   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   IMPRESSION: 1. Substantial improvement in the abnormal infiltrative process in the left medial infraclavicular region, along the inferior margin of the medial left subclavius muscle. On previous exams this process demonstrated substantial mediastinal, substernal, and anterior left upper lobe involvement on the CT scans from July, and on the PET-CT had progressed to bony destructive findings in the anterior left first rib. On today's examination, the pulmonary, mediastinal, and substernal components have resolved, rib involvement is now questionable, and the effect of this process on the adjacent internal jugular and subclavian veins is substantially reduced. Moreover, the right level IV adenopathy and mediastinal adenopathy shown on the prior PET-CT has resolved. The remaining enhancing lesion measures up to 2.7 cm in long axis. The substantial improvement without known specific therapy would seem to suggest inflammatory or infectious process, but I do note that the patient did not have leukocytosis back in August. Possibility of an underlying mass or vascular malformation  which previously bled causing inflammatory response without substantial leukocytosis is a possibility, although the aggressive findings previously present along the left anterior first rib with tended favor either tumor or infection rather than the sequela of a hematoma. Overall given the substantial improvement, surveillance might be a reasonable option although open biopsy to definitively sample and/or remove the underlying lesion with likewise be a very reasonable choice. MRI chest 08/25/21 FINDINGS: Bones/Joint/Cartilage:   The previously demonstrated process anteriorly in the upper left chest wall between the left clavicular head and anterior aspect of the left 1st rib shows continued improvement. There is mild residual asymmetric soft tissue enhancement in this area which extends around the left sternoclavicular joint. No progressive bone destruction identified. No evidence of soft tissue mass.   Ligaments:   No ligamentous abnormalities identified.   Muscles and Tendons:   The overlying pectoralis musculature appears normal.   Soft tissue:   As above, mild residual soft tissue enhancement in the area of the previously demonstrated abnormality. No fluid collection or soft tissue mass identified. The left subclavian and left internal jugular veins appear patent.   IMPRESSION: 1. Further improvement in the previously demonstrated process involving the left anterior chest wall centered at the left 1st costochondral junction. The continued improvement favors a resolving inflammatory/infectious process. No mass lesion identified to suggest neoplasm. 2. This process was better demonstrated by CT, and if additional follow-is warranted clinically, consider neck CT with contrast.      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  This would also contributed to the hemoptysis.  1 possibility is that of tuberculosis with no positive history.  The other possibility is that she could have costochondritis after the fall and had bled out however her hemoglobin ever changed.  Malignancy seems less likely given that this process is gradually resolved over serial MRIs.  Because she still has fullness in the neck and difficulty with the left shoulder and the  joint on the left with also an eaten out appearance of the first rib anteriorly I would like to draw C-reactive protein sed rate blood count metabolic panel thyroid panel  Plan also be to reimage the neck with a CT of the soft tissues of the neck CT of the left shoulder and CT chest  No additional MRIs will be ordered  I will order a QuantiFERON gold as well  Based on the results of the studies I may give consideration to referring her to infectious disease at [facility]  Because of the severity of the pain and swelling I will refill the acetaminophen and oxycodone      Relevant Medications   acyclovir (ZOVIRAX) 400 MG tablet   oxyCODONE-acetaminophen (PERCOCET) 10-325 MG tablet   Other Relevant Orders   Comprehensive metabolic panel   C-reactive protein   QuantiFERON-TB Gold Plus   CBC with Differential/Platelet   Sedimentation Rate   CT Soft Tissue Neck W Contrast   CT Chest W Contrast   CT SHOULDER LEFT WO CONTRAST   Costochondritis   Relevant Orders    Comprehensive metabolic panel   CT Chest W Contrast     Other   Bipolar 1 disorder, depressed (HCC)   Relevant Medications   gabapentin (NEURONTIN) 400 MG capsule   Hemoptysis    This appears to have resolved we will monitor      Mass of left chest wall - Primary    Prior history of anterior chest wall mass on the left this is improved will need to be continued to be monitored and likely is related to the sternoclavicular joint process      Relevant Orders   Comprehensive metabolic panel   CT Chest W Contrast   Other Visit Diagnoses     Encounter for health-related screening       Relevant Orders   Thyroid Panel With TSH   Lipid panel       Meds ordered this encounter  Medications   nitroGLYCERIN (NITRODUR - DOSED IN MG/24 HR) 0.2 mg/hr patch    Sig: Apply 1/4 patch daily to the affected area    Dispense:  30 patch    Refill:  1   acyclovir (ZOVIRAX) 400 MG tablet    Sig: Take 1 tablet (400 mg total) by mouth 2 (two) times daily.    Dispense:  60 tablet  Refill:  2    [employer]   gabapentin (NEURONTIN) 400 MG capsule    Sig: Take 1 capsule (400 mg total) by mouth 2 (two) times daily.    Dispense:  60 capsule    Refill:  1    [employer]   pravastatin (PRAVACHOL) 40 MG tablet    Sig: Take 1 tablet (40 mg total) by mouth daily.    Dispense:  30 tablet    Refill:  3    [employer]   oxyCODONE-acetaminophen (PERCOCET) 10-325 MG tablet    Sig: Take 1 tablet by mouth every 8 (eight) hours as needed for pain.    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    Asencion Noble, MD

## 2021-09-05 ENCOUNTER — Other Ambulatory Visit (HOSPITAL_COMMUNITY): Payer: Self-pay

## 2021-09-05 ENCOUNTER — Ambulatory Visit: Payer: Self-pay | Attending: Critical Care Medicine | Admitting: Critical Care Medicine

## 2021-09-05 ENCOUNTER — Encounter: Payer: Self-pay | Admitting: Critical Care Medicine

## 2021-09-05 ENCOUNTER — Other Ambulatory Visit: Payer: Self-pay

## 2021-09-05 VITALS — BP 116/82 | HR 75 | Wt 168.8 lb

## 2021-09-05 DIAGNOSIS — M009 Pyogenic arthritis, unspecified: Secondary | ICD-10-CM

## 2021-09-05 DIAGNOSIS — F319 Bipolar disorder, unspecified: Secondary | ICD-10-CM

## 2021-09-05 DIAGNOSIS — M940 Chondrocostal junction syndrome [Tietze]: Secondary | ICD-10-CM

## 2021-09-05 DIAGNOSIS — R042 Hemoptysis: Secondary | ICD-10-CM

## 2021-09-05 DIAGNOSIS — M7061 Trochanteric bursitis, right hip: Secondary | ICD-10-CM

## 2021-09-05 DIAGNOSIS — R222 Localized swelling, mass and lump, trunk: Secondary | ICD-10-CM

## 2021-09-05 DIAGNOSIS — Z139 Encounter for screening, unspecified: Secondary | ICD-10-CM

## 2021-09-05 DIAGNOSIS — R1313 Dysphagia, pharyngeal phase: Secondary | ICD-10-CM

## 2021-09-05 MED ORDER — OXYCODONE-ACETAMINOPHEN 10-325 MG PO TABS
1.0000 | ORAL_TABLET | Freq: Three times a day (TID) | ORAL | 0 refills | Status: DC | PRN
  Filled 2021-09-05: qty 90, 30d supply, fill #0
  Filled 2021-09-09: qty 16, 6d supply, fill #0
  Filled 2021-09-09: qty 74, 24d supply, fill #0
  Filled 2021-09-09: qty 10, 4d supply, fill #0

## 2021-09-05 MED ORDER — PRAVASTATIN SODIUM 40 MG PO TABS
40.0000 mg | ORAL_TABLET | Freq: Every day | ORAL | 3 refills | Status: DC
  Filled 2021-09-05: qty 30, 30d supply, fill #0
  Filled 2021-11-05: qty 30, 30d supply, fill #1
  Filled 2021-12-16: qty 30, 30d supply, fill #2
  Filled 2022-01-10: qty 30, 30d supply, fill #3

## 2021-09-05 MED ORDER — ACYCLOVIR 400 MG PO TABS
400.0000 mg | ORAL_TABLET | Freq: Two times a day (BID) | ORAL | 2 refills | Status: DC
  Filled 2021-09-05: qty 60, 30d supply, fill #0

## 2021-09-05 MED ORDER — GABAPENTIN 400 MG PO CAPS
400.0000 mg | ORAL_CAPSULE | Freq: Two times a day (BID) | ORAL | 1 refills | Status: DC
  Filled 2021-09-05 – 2021-09-15 (×3): qty 60, 30d supply, fill #0

## 2021-09-05 MED ORDER — NITROGLYCERIN 0.2 MG/HR TD PT24
MEDICATED_PATCH | TRANSDERMAL | 1 refills | Status: DC
  Filled 2021-09-05: qty 8, 32d supply, fill #0
  Filled 2022-01-12: qty 8, 32d supply, fill #1

## 2021-09-05 NOTE — Assessment & Plan Note (Signed)
This appears to have resolved we will monitor

## 2021-09-05 NOTE — Assessment & Plan Note (Signed)
Recurrent bursitis of the right hip we will refill nitro due to the hip

## 2021-09-05 NOTE — Patient Instructions (Signed)
CT of the neck chest and left shoulder will be obtained  Health screening labs along with labs to evaluate for chronic infection have been ordered  [facility]ills on all medications [facility]  Return to Dr. Joya Gaskins 3 to 4 weeks  Dr. Joya Gaskins will be giving consideration to referring you to [place] infectious disease department based on results of labs and imaging  Refill on the Nitro-Dur was given apply that to your hip

## 2021-09-05 NOTE — Assessment & Plan Note (Signed)
This appears to have improved to some degree we will monitor

## 2021-09-05 NOTE — Assessment & Plan Note (Signed)
Prior history of anterior chest wall mass on the left this is improved will need to be continued to be monitored and likely is related to the sternoclavicular joint process

## 2021-09-05 NOTE — Assessment & Plan Note (Addendum)
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  This would also contributed to the hemoptysis.  1 possibility is that of tuberculosis with no positive history.  The other possibility is that she could have costochondritis after the fall and had bled out however her hemoglobin ever changed.  Malignancy seems less likely given that this process is gradually resolved over serial MRIs.  Because she still has fullness in the neck and difficulty with the left shoulder and the [place] joint on the left with also an eaten out appearance of the first rib anteriorly I would like to draw C-reactive protein sed rate blood count metabolic panel thyroid panel  Plan also be to reimage the neck with a CT of the soft tissues of the neck CT of the left shoulder and CT chest  No additional MRIs will be ordered  I will order a QuantiFERON gold as well  Based on the results of the studies I may give consideration to referring her to infectious disease at [facility]  Because of the severity of the pain and swelling I will refill the acetaminophen and oxycodone

## 2021-09-06 ENCOUNTER — Encounter: Payer: Self-pay | Admitting: Critical Care Medicine

## 2021-09-07 ENCOUNTER — Encounter: Payer: Self-pay | Admitting: Critical Care Medicine

## 2021-09-07 ENCOUNTER — Other Ambulatory Visit (HOSPITAL_COMMUNITY): Payer: Self-pay

## 2021-09-07 LAB — COMPREHENSIVE METABOLIC PANEL
ALT: 10 IU/L (ref 0–32)
AST: 14 IU/L (ref 0–40)
Albumin/Globulin Ratio: 1.5 (ref 1.2–2.2)
Albumin: 4.4 g/dL (ref 3.8–4.9)
Alkaline Phosphatase: 115 IU/L (ref 44–121)
BUN/Creatinine Ratio: 14 (ref 9–23)
BUN: 12 mg/dL (ref 6–24)
Bilirubin Total: <0.2 mg/dL (ref 0.0–1.2)
CO2: 22 mmol/L (ref 20–29)
Calcium: 9.3 mg/dL (ref 8.7–10.2)
Chloride: 101 mmol/L (ref 96–106)
Creatinine, Ser: 0.85 mg/dL (ref 0.57–1.00)
Globulin, Total: 2.9 g/dL (ref 1.5–4.5)
Glucose: 90 mg/dL (ref 70–99)
Potassium: 5.3 mmol/L — ABNORMAL HIGH (ref 3.5–5.2)
Sodium: 138 mmol/L (ref 134–144)
Total Protein: 7.3 g/dL (ref 6.0–8.5)
eGFR: 81 mL/min/{1.73_m2} (ref >59)

## 2021-09-07 LAB — CBC WITH DIFFERENTIAL/PLATELET
Basophils Absolute: 0.1 10*3/uL (ref 0.0–0.2)
Basos: 1 %
EOS (ABSOLUTE): 0.2 10*3/uL (ref 0.0–0.4)
Eos: 3 %
Hematocrit: 45.6 % (ref 34.0–46.6)
Hemoglobin: 14.7 g/dL (ref 11.1–15.9)
Immature Grans (Abs): 0 10*3/uL (ref 0.0–0.1)
Immature Granulocytes: 0 %
Lymphocytes Absolute: 3.3 10*3/uL — ABNORMAL HIGH (ref 0.7–3.1)
Lymphs: 36 %
MCH: 30.6 pg (ref 26.6–33.0)
MCHC: 32.2 g/dL (ref 31.5–35.7)
MCV: 95 fL (ref 79–97)
Monocytes Absolute: 0.5 10*3/uL (ref 0.1–0.9)
Monocytes: 5 %
Neutrophils Absolute: 4.9 10*3/uL (ref 1.4–7.0)
Neutrophils: 55 %
Platelets: 472 10*3/uL — ABNORMAL HIGH (ref 150–450)
RBC: 4.81 x10E6/uL (ref 3.77–5.28)
RDW: 12.6 % (ref 11.7–15.4)
WBC: 9.1 10*3/uL (ref 3.4–10.8)

## 2021-09-07 LAB — THYROID PANEL WITH TSH
Free Thyroxine Index: 1.6 (ref 1.2–4.9)
T3 Uptake Ratio: 26 % (ref 24–39)
T4, Total: 6 ug/dL (ref 4.5–12.0)
TSH: 2.64 u[IU]/mL (ref 0.450–4.500)

## 2021-09-07 LAB — QUANTIFERON-TB GOLD PLUS
QuantiFERON Mitogen Value: >10 IU/mL
QuantiFERON Nil Value: 0.07 IU/mL
QuantiFERON TB1 Ag Value: 0.05 IU/mL
QuantiFERON TB2 Ag Value: 0.06 IU/mL
QuantiFERON-TB Gold Plus: NEGATIVE

## 2021-09-07 LAB — LIPID PANEL
Chol/HDL Ratio: 3.6 ratio (ref 0.0–4.4)
Cholesterol, Total: 203 mg/dL — ABNORMAL HIGH (ref 100–199)
HDL: 56 mg/dL (ref >39)
LDL Chol Calc (NIH): 117 mg/dL — ABNORMAL HIGH (ref 0–99)
Triglycerides: 170 mg/dL — ABNORMAL HIGH (ref 0–149)
VLDL Cholesterol Cal: 30 mg/dL (ref 5–40)

## 2021-09-07 LAB — SEDIMENTATION RATE: Sed Rate: 12 mm/hr (ref 0–40)

## 2021-09-07 LAB — C-REACTIVE PROTEIN: CRP: 5 mg/L (ref 0–10)

## 2021-09-08 ENCOUNTER — Encounter: Payer: Self-pay | Admitting: Critical Care Medicine

## 2021-09-08 ENCOUNTER — Other Ambulatory Visit (HOSPITAL_COMMUNITY): Payer: Self-pay

## 2021-09-08 MED ORDER — INDOMETHACIN 50 MG PO CAPS
50.0000 mg | ORAL_CAPSULE | Freq: Three times a day (TID) | ORAL | 1 refills | Status: DC
Start: 2021-09-08 — End: 2021-10-03
  Filled 2021-09-08: qty 90, 30d supply, fill #0

## 2021-09-08 NOTE — Progress Notes (Signed)
Pt is aware of results. 

## 2021-09-09 ENCOUNTER — Other Ambulatory Visit (HOSPITAL_COMMUNITY): Payer: Self-pay

## 2021-09-12 ENCOUNTER — Other Ambulatory Visit (HOSPITAL_COMMUNITY): Payer: Self-pay

## 2021-09-14 ENCOUNTER — Other Ambulatory Visit (HOSPITAL_COMMUNITY): Payer: Self-pay

## 2021-09-14 ENCOUNTER — Other Ambulatory Visit: Payer: Self-pay

## 2021-09-15 ENCOUNTER — Other Ambulatory Visit: Payer: Self-pay | Admitting: Critical Care Medicine

## 2021-09-15 ENCOUNTER — Other Ambulatory Visit: Payer: Self-pay

## 2021-09-15 ENCOUNTER — Other Ambulatory Visit (HOSPITAL_COMMUNITY): Payer: Self-pay

## 2021-09-15 ENCOUNTER — Encounter: Payer: Self-pay | Admitting: Critical Care Medicine

## 2021-09-15 ENCOUNTER — Ambulatory Visit (HOSPITAL_COMMUNITY)
Admission: RE | Admit: 2021-09-15 | Discharge: 2021-09-15 | Disposition: A | Discharge status: Home / Self Care | Payer: Self-pay | Source: Ambulatory Visit | Attending: Critical Care Medicine | Admitting: Critical Care Medicine

## 2021-09-15 DIAGNOSIS — M009 Pyogenic arthritis, unspecified: Secondary | ICD-10-CM | POA: Insufficient documentation

## 2021-09-15 DIAGNOSIS — M940 Chondrocostal junction syndrome [Tietze]: Secondary | ICD-10-CM | POA: Insufficient documentation

## 2021-09-15 DIAGNOSIS — R222 Localized swelling, mass and lump, trunk: Secondary | ICD-10-CM | POA: Insufficient documentation

## 2021-09-15 MED ORDER — IOHEXOL 350 MG/ML SOLN
100.0000 mL | Freq: Once | INTRAVENOUS | Status: AC | PRN
  Administered 2021-09-15: 12:00:00 100 mL via INTRAVENOUS

## 2021-09-19 NOTE — Progress Notes (Incomplete)
Established Patient Office Visit  Subjective:  Patient ID: Katie Sherman, female    DOB: June 15, 1966  Age: 56 y.o. MRN: [id number]  CC: Chronic pain in the left side of the neck and clavicle  HPI Katie Sherman presents for primary care to establish and follow-up of chronic condition of the chest and neck. Below are series of the previous documentations I have on this patient when I have seen her 1 time only in July 2022 for pulmonary consultation by her patient's primary care provider Dr. Wynetta Emery.  Subsequent to this the patient was cared for by pulmonary medicine Dr. Lamonte Sakai and thoracic surgery Dr. Kipp Brood and was seen 1 time by infectious disease Dr. Linus Salmons.  01/11/21 This a 56 year old female referred for hemoptysis difficulty swallowing and neck fullness, neck pain and supraclavicular fullness bilaterally of 4 weeks duration.  The patient's primary care provider is Dr. Wynetta Emery.  The patient's hemoptysis is pure blood with clots not mixed with mucus.  She had an episode of minimal hemoptysis a year ago last summer in 2021.  This went away she then developed COVID to beginning portion of 2022 recovered from this.  Patient began having neck swelling and pain 4 weeks ago and presented for evaluations both in the clinic in the emergency room.  Evaluations were not revealing.   At that time she was not having hemoptysis but over the past week she has had increased coughing up of blood combination with progressive swelling in the neck in the supraclavicular areas.  She is also having difficulty with swallowing as well.  Patient does smoke a pack a day of cigarettes and is under a lot of stress unable to reduce her tobacco intake.  She does have some dyspnea on exertion but no wheezing.  She is never been diagnosed with pneumonia.  She did have a CT of the chest in February of this year showing some patchy basilar groundglass changes.  She has also chronic subpleural nodules which have been unchanged  compared to previous exam in 2021 versus February 2022.  The patient's been out of work the last 2 weeks because of the difficulty she is now experiencing.   Patient's past medical history significant for cervical cancer in her self and family history of breast cancer.  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   Note ED visit 01/01/21 Katie Sherman is a 56 y.o. female with past medical history of tobacco use, cervical cancer.  Patient presents to the emergency department with a chief complaint of swelling and tenderness to her neck.  Patient reports that her symptoms started on Wednesday and were located on the left side of her neck.  Patient also endorsed that she was having some difficulty and pain when swallowing liquids but "only on the left side of my throat."  She reports that Thursday morning swelling and pain wear to both sides of her throat.  Reports progressive worsening of symptoms on Friday.  Patient reports that she tried to take your prescribed Acacian Friday morning but it "felt like my throat was closing up.".  States that it felt like one of her pills had become stuck in her throat.  Patient reports that she "stuck a wooden spoon down my throat," in an attempt to push the pill further along.  Patient reports that she had an episode of vomiting after this.     At present patient reports that pain is only located to the right side of her neck.  Patient rates her pain 10/10 on the pain scale.  Patient reports that pain is worse with movement and touch.  Patient reports that she has had some relief with ibuprofen earlier in the week but has not taken any today.    08/26/21 phone note I attempted to call the patient to inform her about her MRI results. Note there was a phone virtual  visit with Dr Kipp Brood who spoke to the patient around 2pm today but due to high ED volume the MRI had yet to read at that point. I have received two very long MyChart messages from the patient this afternoon expressing her frustrations        I spoke to Dr Camie Patience, Chief of Radiology [facility] on the results  He and I reviewed all prior imaging to date.   The process appears to have started behind the sternoclavicular joint and first rib at the cartilaginous junction posteriorly.  It has significantly regressed and all the reactive mediastinal cervical and axillary adenopathy has disappeared   There is no cervical spine pathology.  The interna Jugular is patent .  As this was a combined MRI MRA study all great vessels to and from the heart are NORMAL   This does NOT represent Cancer    DR Lin Landsman had an interesting hypothesis, what if this were from some type of costochondritis that resulted in cartilaginous fracture and bleeding in the past of summer 2022.   The pt steadfastly denies trauma or IVDU .      In any case it is not cancer but likely inflammatory.   I have tried to call the patient multiple times.   She is living with a friend off [address].     I have left two phone msg's on the patients cell  there is no answer,  I have tried to call at least 8 times between 4p and now to no avail   I am copying this phone note with Drs Lamonte Sakai and Kipp Brood.  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  She is well aware it at about the current MRI as she had a visit with Dr. Kipp Brood over the phone he explained the current findings to her.  She was not available when I tried to call her 2 weeks ago.  She agrees today to become primary care under my coverage as well as follow-up here for her pulmonary status.  She states she is not having swallowing difficulty as she was before but has a burning sensation  on the back of her neck she has bad dentition.  She feels a squeezing sensation in the chest neck area and pain in the left clavicle.  She also notes a slight increase swelling in the right side of the neck as well.  She has left-sided shoulder pain as well.  She states she did fall early in the spring down her stairs and tried to grab with her left hand she does not recall specifically injuring her shoulder at that visit.  Patient subsequent developed acute onset of the symptoms in the neck in June 2022.  She then saw her primary care provider who then referred her to me.  See my July 2022 note.  At that point we ordered imaging of the neck with a CT scan.  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  And other findings of mass effect behind the left clavicle and also involvement of the tip of the first rib on the left and mediastinal adenopathy and mass effect under the chest wall.  Serial imagings over a period of time including PET scan and 2 other MRIs showed the mass lesion is reducing in size and the most recent MRI in February of this year showed marked reduction of all areas of involvement.  Patient denies any infectious disease history she denies any IV drug use.  No history of tuberculosis.  No fever chills or sweats.  Her weight is down somewhat.  She has not had any systemic blood draws.  Patient denies any further hemoptysis this only occurred in the summer 2022.  She is requesting a refill on a nitro drip patch which helped with bursitis of the right hip which she got from orthopedics in 2019.  She also wants her acyclovir refilled pill.  She would like refills on her chronic pain medicine.  She convened today with me today to see if any additional imaging studies can be performed and other evaluations be performed for this patient.  09/20/21  Past Medical History:  Diagnosis Date   Bilateral carpal tunnel syndrome 03/19/2018   Cervical cancer (HCC)    Cervical  cancer ([place])    History of exercise stress test    ETT-Echo 5/18: normal EF, no ischemia   History of loop recorder    currently on person   Kidney stone     Past Surgical History:  Procedure Laterality Date   CERVICAL CONE BIOPSY     TUBAL LIGATION      Family History  Problem Relation Age of Onset   Heart attack Mother 62   Heart disease Father    Heart attack Father 18   Liver cancer Father    Bone cancer Other    Breast cancer Maternal Aunt        Diagnosed in 1's   Bone cancer Maternal Aunt     Social History   Socioeconomic History   Marital status: Widowed    Spouse name: Not on file   Number of children: Not on file   Years of education: Not on file   Highest education level: Not on file  Occupational History   Occupation: unemployed  Tobacco Use   Smoking status: Every Day    Packs/day: 0.50    Years: 36.00    Pack years: 18.00    Types: Cigarettes    Start date: 3   Smokeless tobacco: Never   Tobacco comments:    Smokes 1 pack a day ARJ 01/20/21  Vaping Use   Vaping Use: Never used  Substance and Sexual Activity   Alcohol use: No   Drug use: Yes    Types: Marijuana    Comment: stopped 04/06/2017   Sexual activity: Not Currently    Birth control/protection: None  Other Topics Concern   Not on file  Social History Narrative   Prior traffic control specialist - unemployed now   Widow   5 children, 10 grandchildren   Moved here from [place] 2013   Social Determinants of Health   Financial Resource Strain: Not on file  Food Insecurity: Not on file  Transportation Needs: Not on file  Physical Activity: Not on file  Stress: Not on file  Social Connections: Not on file  Intimate Partner Violence: Not on file    Outpatient Medications Prior to Visit  Medication Sig Dispense Refill   acyclovir (ZOVIRAX) 400 MG tablet Take 1 tablet (400 mg total) by mouth 2 (two) times daily. 60 tablet 2   albuterol (VENTOLIN  HFA) 108 (90 Base) MCG/ACT inhaler Inhale 2 puffs into the lungs every 6 (six) hours as needed for wheezing or shortness of breath. 8.5 g 2   gabapentin (NEURONTIN) 400 MG capsule Take 1 capsule (400 mg total) by mouth 2 (two) times daily. 60 capsule 1   indomethacin (INDOCIN) 50 MG capsule Take 1 capsule (50 mg total) by mouth 3 (three) times daily with meals. 90 capsule 1   lamoTRIgine (LAMICTAL) 100 MG tablet Take 3 tablets (300 mg total) by mouth daily. 90 tablet 1   nitroGLYCERIN (NITRODUR - DOSED IN MG/24 HR) 0.2 mg/hr patch Apply 1/4 patch daily to the affected area 30 patch 1   omeprazole (PRILOSEC) 20 MG capsule Take 1 capsule (20 mg total) by mouth daily. 90 capsule 1   oxyCODONE-acetaminophen (PERCOCET) 10-325 MG tablet Take 1 tablet by mouth every 8 (eight) hours as needed for pain. 90 tablet 0   pravastatin (PRAVACHOL) 40 MG tablet Take 1 tablet (40 mg total) by mouth daily. 30 tablet 3   QUEtiapine (SEROQUEL) 200 MG tablet Take 1 tablet (200 mg total) by mouth at bedtime. 30 tablet 1   No facility-administered medications prior to visit.    No Known Allergies  ROS Review of Systems  Constitutional:  Positive for fatigue and unexpected weight change. Negative for chills, diaphoresis and fever.  HENT:  Positive for trouble swallowing. Negative for ear pain, postnasal drip, rhinorrhea, sinus pressure, sore throat and voice change.   Eyes: Negative.   Respiratory: Negative.  Negative for apnea, cough, choking, chest tightness, shortness of breath, wheezing and stridor.        No further hemoptysis  Cardiovascular:  Positive for chest pain. Negative for palpitations and leg swelling.  Gastrointestinal: Negative.  Negative for abdominal distention, abdominal pain, nausea and vomiting.  Genitourinary: Negative.   Musculoskeletal:  Positive for neck pain and neck stiffness. Negative for arthralgias and myalgias.  Skin: Negative.  Negative for rash.  Allergic/Immunologic:  Negative.  Negative for environmental allergies and food allergies.  Neurological: Negative.  Negative for dizziness, syncope, weakness and headaches.  Hematological: Negative.  Negative for adenopathy. Does not bruise/bleed easily.  Psychiatric/Behavioral:  Positive for dysphoric mood. Negative for agitation and sleep disturbance. The patient is not nervous/anxious.      Objective:    Physical Exam Vitals reviewed.  Constitutional:      Appearance: Normal appearance. She is well-developed. She is not diaphoretic.  HENT:     Head: Normocephalic and atraumatic.     Nose: Nose normal. No nasal deformity, septal deviation, mucosal edema or rhinorrhea.     Right Sinus: No maxillary sinus tenderness or frontal sinus tenderness.     Left Sinus: No maxillary sinus tenderness or frontal sinus tenderness.     Mouth/Throat:     Mouth: Mucous membranes are moist.     Pharynx: Oropharynx is clear. No oropharyngeal exudate.     Comments: Poor dentition Eyes:     General: No scleral icterus.    Conjunctiva/sclera: Conjunctivae normal.     Pupils: Pupils are equal, round, and reactive to light.  Neck:     Thyroid: No thyromegaly.     Vascular: No carotid  bruit or JVD.     Trachea: Trachea normal. No tracheal tenderness or tracheal deviation.  Cardiovascular:     Rate and Rhythm: Normal rate and regular rhythm.     Chest Wall: PMI is not displaced.     Pulses: Normal pulses. No decreased pulses.     Heart sounds: Normal heart sounds, S1 normal and S2 normal. Heart sounds not distant. No murmur heard. No systolic murmur is present.  No diastolic murmur is present.    No friction rub. No gallop. No S3 or S4 sounds.  Pulmonary:     Effort: Pulmonary effort is normal. No tachypnea, accessory muscle usage or respiratory distress.     Breath sounds: Normal breath sounds. No stridor. No decreased breath sounds, wheezing, rhonchi or rales.  Chest:     Chest wall: Deformity, swelling and tenderness  present. No mass, lacerations, crepitus or edema. There is no dullness to percussion.  Breasts:    Right: Normal.     Left: Normal.       Comments: Fullness in the left neck area and extreme tenderness in the sternoclavicular joint and also tenderness that continues on the clavicle towards the shoulder, significant pain when I adduct the shoulder Abdominal:     General: Bowel sounds are normal. There is no distension.     Palpations: Abdomen is soft. Abdomen is not rigid.     Tenderness: There is no abdominal tenderness. There is no guarding or rebound.  Musculoskeletal:        General: Normal range of motion.     Cervical back: Normal range of motion and neck supple. No edema, erythema or rigidity. No muscular tenderness. Normal range of motion.  Lymphadenopathy:     Head:     Right side of head: No submental or submandibular adenopathy.     Left side of head: No submental or submandibular adenopathy.     Cervical: No cervical adenopathy.     Upper Body:     Left upper body: Supraclavicular adenopathy present. No axillary or pectoral adenopathy.  Skin:    General: Skin is warm and dry.     Coloration: Skin is not pale.     Findings: No rash.     Nails: There is no clubbing.  Neurological:     Mental Status: She is alert and oriented to person, place, and time.     Sensory: No sensory deficit.  Psychiatric:        Mood and Affect: Mood normal.        Speech: Speech normal.        Behavior: Behavior normal.    LMP 04/29/2012  Wt Readings from Last 3 Encounters:  09/05/21 168 lb 12.8 oz (76.6 kg)  07/21/21 178 lb 12.8 oz (81.1 kg)  05/20/21 170 lb (77.1 kg)     Health Maintenance Due  Topic Date Due   COLONOSCOPY (Pts 45-42yr Insurance coverage will need to be confirmed)  Never done   MAMMOGRAM  Never done   PAP SMEAR-Modifier  11/21/2019    There are no preventive care reminders to display for this patient.  Lab Results  Component Value Date   TSH 2.640  09/05/2021   Lab Results  Component Value Date   WBC 9.1 09/05/2021   HGB 14.7 09/05/2021   HCT 45.6 09/05/2021   MCV 95 09/05/2021   PLT 472 (H) 09/05/2021   Lab Results  Component Value Date   NA 138 09/05/2021   K 5.3 (H)  09/05/2021   CO2 22 09/05/2021   GLUCOSE 90 09/05/2021   BUN 12 09/05/2021   CREATININE 0.85 09/05/2021   BILITOT <0.2 09/05/2021   ALKPHOS 115 09/05/2021   AST 14 09/05/2021   ALT 10 09/05/2021   PROT 7.3 09/05/2021   ALBUMIN 4.4 09/05/2021   CALCIUM 9.3 09/05/2021   ANIONGAP 11 03/18/2021   EGFR 81 09/05/2021   Lab Results  Component Value Date   CHOL 203 (H) 09/05/2021   Lab Results  Component Value Date   HDL 56 09/05/2021   Lab Results  Component Value Date   LDLCALC 117 (H) 09/05/2021   Lab Results  Component Value Date   TRIG 170 (H) 09/05/2021   Lab Results  Component Value Date   CHOLHDL 3.6 09/05/2021   Lab Results  Component Value Date   HGBA1C 5.3 01/01/2020  CT CHEST 09/15/21 FINDINGS: Cardiovascular: Heart size is normal. No pericardial effusion. Main pulmonary artery is normal caliber. Thoracic aorta is normal in course and caliber.   Mediastinum/Nodes: No enlarged mediastinal, hilar, or axillary lymph nodes. Thyroid gland, trachea, and esophagus demonstrate no significant findings.   Lungs/Pleura: Lungs are hyperinflated with emphysematous changes, upper lobe predominant. No focal consolidation or pulmonary nodule identified. No pleural effusion or pneumothorax.   Upper Abdomen: Small hiatal hernia.  No acute process identified.   Musculoskeletal: Interval resolution of the previous extensive infiltrative masslike fat stranding densities in the left chest wall centered near the sternum and mediastinum. No acute fracture or suspicious bony lesions identified.   IMPRESSION: 1. No acute intrathoracic process identified. Interval resolution of previous infiltrative process in the left anterior chest wall. 2.  Emphysema.   Emphysema (ICD10-J43.9).   CT NECK 09/15/21 FINDINGS: Pharynx and larynx: Normal. No mass or swelling.   Salivary glands: No inflammation, mass, or stone.   Thyroid: Normal.   Lymph nodes: Previously noted enlarged lymph nodes are decreased in size. For example, a right level 4 lymph node that measured up to 14 x 19 mm now measures approximately 5 x 8 mm (series 4, image 122). Left level 4 lymph node that measured 10 x 12 mm and was hyperenhancing now measures 9 x 6 mm (series 4, image 114) and is not hyperenhancing. No new lymphadenopathy.   Vascular: Negative.   Limited intracranial: Negative.   Visualized orbits: Negative.   Mastoids and visualized paranasal sinuses: Mild mucosal thickening in the right maxillary sinus. Trace fluid in the mastoid air cells.   Skeleton: Acute osseous abnormality.   Upper chest: Please see same-day CT chest.   Other: Previously noted infiltrative process at the left thoracic inlet and upper mediastinum is no longer seen.   IMPRESSION: 1. No acute process in the neck. 2. Previously noted infiltrative process centered in the left chest wall has resolved, with associated decrease in size of multiple cervical lymph nodes.    CT Neck 01/19/21 EXAM: CT NECK WITH CONTRAST   TECHNIQUE: Multidetector CT imaging of the neck was performed using the standard protocol following the bolus administration of intravenous contrast.   CONTRAST:  154m OMNIPAQUE IOHEXOL 350 MG/ML SOLN   COMPARISON:  08/19/2020   FINDINGS: Pharynx and larynx: No evidence of primary mass or inflammation along the mucosa   Salivary glands: No inflammation, mass, or stone.   Thyroid: Normal.   Lymph nodes: Bilateral jugular chain adenopathy. A right level 4 node measures 19 mm maximum on axial images. There is an infiltrative process at the left thoracic inlet and  upper mediastinum, with sheet like soft tissue thickening posterior to the jugular  notch and manubrium. Just anterior to the left IJ brachiocephalic confluence is a cystic appearing component measuring 12 mm. Soft tissue density extends anterior to the left first costochondral junction. There is reticulation and adjacent left upper lobe with partially covered layering left pleural effusion.   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   Limited intracranial: Negative   Visualized orbits: Negative   Mastoids and visualized paranasal sinuses: Clear   Skeleton: No erosion of ribs or joint spaces adjacent to the inflammation, although the left first costochondral junction is wider than the right.   Upper chest: Reticular opacity in the subpleural left upper lobe adjacent to the chest wall process.   These results will be called to the ordering clinician or representative by the Radiologist Assistant, and communication documented in the PACS or [employer].   IMPRESSION: Infiltrative process centered on the left chest wall, especially at left first costochondral junction, with regional adenopathy , mediastinal fat infiltration, and apical pulmonary opacity. Atypical infection or malignancy are both possible.   MRI 05/2021 FINDINGS: Along the inferomedial margin of the left subclavius muscle for example on image 26 of series 24 and also shown on image 26 of series 18, we demonstrate a somewhat infiltrative 2.7 by 2.2 by 2.6 cm (volume = 8.1 cm^3) process. When I compare back to prior exams, the examinations of 01/18/2021 showed a substantially greater amount of abnormal enhancement in the retrosternal region, anterior mediastinum, and in the anterior left upper lobe adjacent to this lesion. By the time of the 02/03/2021 PET-CT, there was new bony destruction of the left anterior first rib but was not present on the earlier 01/27/2021 examinations. On today's exam there is substantial reduction in the overall  previously enhancing soft tissue both in the subclavicular and subpectoral region and in the mediastinum, with anterior mediastinal and retrosternal involvement essentially resolved.   Moreover, the previous right level IV adenopathy in the lower neck shown on prior PET-CT has resolved. Please note that today's examination was not a diagnostic CT of the neck but does include the region of the prior enlarged right level IV lymph nodes. Prior AP window lymph node previously 1.1 cm in short axis diameter, currently 0.4 cm in short axis on image 46 series 17.   There is some accentuated enhancement medially in the left subclavian small cell compared to right for example on image 25 of series 24 which may be related to the underlying process.   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   IMPRESSION: 1. Substantial improvement in the abnormal infiltrative process in the left medial infraclavicular region, along the inferior margin of the medial left subclavius muscle. On previous exams this process demonstrated substantial mediastinal, substernal, and anterior left upper lobe involvement on the CT scans from July, and on the PET-CT had progressed to bony destructive findings in the anterior left first rib. On today's examination, the pulmonary, mediastinal, and substernal components have resolved, rib involvement is now questionable, and the effect of this process on the adjacent internal jugular and subclavian veins is substantially reduced. Moreover, the right level IV adenopathy and mediastinal adenopathy shown  on the prior PET-CT has resolved. The remaining enhancing lesion measures up to 2.7 cm in long axis. The  substantial improvement without known specific therapy would seem to suggest inflammatory or infectious process, but I do note that the patient did not have leukocytosis back in August. Possibility of an underlying mass or vascular malformation which previously bled causing inflammatory response without substantial leukocytosis is a possibility, although the aggressive findings previously present along the left anterior first rib with tended favor either tumor or infection rather than the sequela of a hematoma. Overall given the substantial improvement, surveillance might be a reasonable option although open biopsy to definitively sample and/or remove the underlying lesion with likewise be a very reasonable choice. MRI chest 08/25/21 FINDINGS: Bones/Joint/Cartilage:   The previously demonstrated process anteriorly in the upper left chest wall between the left clavicular head and anterior aspect of the left 1st rib shows continued improvement. There is mild residual asymmetric soft tissue enhancement in this area which extends around the left sternoclavicular joint. No progressive bone destruction identified. No evidence of soft tissue mass.   Ligaments:   No ligamentous abnormalities identified.   Muscles and Tendons:   The overlying pectoralis musculature appears normal.   Soft tissue:   As above, mild residual soft tissue enhancement in the area of the previously demonstrated abnormality. No fluid collection or soft tissue mass identified. The left subclavian and left internal jugular veins appear patent.   IMPRESSION: 1. Further improvement in the previously demonstrated process involving the left anterior chest wall centered at the left 1st costochondral junction. The continued improvement favors a resolving inflammatory/infectious process. No mass lesion identified to suggest neoplasm. 2. This process was better demonstrated by CT, and if additional follow-is  warranted clinically, consider neck CT with contrast.      Assessment & Plan:   Problem List Items Addressed This Visit   None  No orders of the defined types were placed in this encounter.   Follow-up: No follow-ups on file.    Katie Noble, MD

## 2021-09-20 ENCOUNTER — Ambulatory Visit: Payer: Medicaid Other | Admitting: Critical Care Medicine

## 2021-09-22 ENCOUNTER — Encounter: Payer: Self-pay | Admitting: Critical Care Medicine

## 2021-09-23 ENCOUNTER — Other Ambulatory Visit (HOSPITAL_COMMUNITY): Payer: Self-pay

## 2021-09-27 ENCOUNTER — Encounter: Payer: Self-pay | Admitting: Physician Assistant

## 2021-09-27 ENCOUNTER — Encounter (HOSPITAL_COMMUNITY): Payer: Self-pay | Admitting: Psychiatry

## 2021-09-27 ENCOUNTER — Telehealth (HOSPITAL_BASED_OUTPATIENT_CLINIC_OR_DEPARTMENT_OTHER): Payer: Self-pay | Admitting: Psychiatry

## 2021-09-27 ENCOUNTER — Other Ambulatory Visit: Payer: Self-pay

## 2021-09-27 ENCOUNTER — Encounter: Payer: Self-pay | Admitting: Critical Care Medicine

## 2021-09-27 VITALS — Wt 168.0 lb

## 2021-09-27 DIAGNOSIS — F4310 Post-traumatic stress disorder, unspecified: Secondary | ICD-10-CM

## 2021-09-27 DIAGNOSIS — F1210 Cannabis abuse, uncomplicated: Secondary | ICD-10-CM

## 2021-09-27 DIAGNOSIS — F319 Bipolar disorder, unspecified: Secondary | ICD-10-CM

## 2021-09-27 MED ORDER — TRAZODONE HCL 50 MG PO TABS
50.0000 mg | ORAL_TABLET | Freq: Every evening | ORAL | 0 refills | Status: DC | PRN
  Filled 2021-09-27 – 2021-09-30 (×2): qty 30, 30d supply, fill #0

## 2021-09-27 MED ORDER — QUETIAPINE FUMARATE 200 MG PO TABS
200.0000 mg | ORAL_TABLET | Freq: Every day | ORAL | 2 refills | Status: DC
  Filled 2021-09-27 – 2021-11-07 (×3): qty 30, 30d supply, fill #0

## 2021-09-27 MED ORDER — GABAPENTIN 400 MG PO CAPS
400.0000 mg | ORAL_CAPSULE | Freq: Two times a day (BID) | ORAL | 2 refills | Status: DC
  Filled 2021-09-27: qty 60, 30d supply, fill #0

## 2021-09-27 MED ORDER — LAMOTRIGINE 100 MG PO TABS
300.0000 mg | ORAL_TABLET | Freq: Every day | ORAL | 2 refills | Status: DC
Start: 2021-09-27 — End: 2021-11-07
  Filled 2021-09-27 – 2021-10-15 (×2): qty 90, 30d supply, fill #0
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
## 2021-09-30 ENCOUNTER — Other Ambulatory Visit (HOSPITAL_COMMUNITY): Payer: Self-pay

## 2021-10-01 NOTE — Progress Notes (Signed)
? ?Established Patient Office Visit ? ?Subjective:  ?Patient ID: Katie Sherman, female    DOB: Jan 27, 1966  Age: 56 y.o. MRN: [id number] ? ?CC: Chronic pain in the left side of the neck and clavicle ? ?HPI  ?09/05/21 ?Katie Sherman presents for primary care to establish and follow-up of chronic condition of the chest and neck. ?Below are series of the previous documentations I have on this patient when I have seen her 1 time only in July 2022 for pulmonary consultation by her patient's primary care provider Dr. Wynetta Emery.  Subsequent to this the patient was cared for by pulmonary medicine Dr. Lamonte Sakai and thoracic surgery Dr. Kipp Brood and was seen 1 time by infectious disease Dr. Linus Salmons. ? ?01/11/21 ?This a 56 year old female referred for hemoptysis difficulty swallowing and neck fullness, neck pain and supraclavicular fullness bilaterally of 4 weeks duration.  The patient's primary care provider is Dr. Wynetta Emery.  The patient's hemoptysis is pure blood with clots not mixed with mucus.  She had an episode of minimal hemoptysis a year ago last summer in 2021.  This went away she then developed COVID to beginning portion of 2022 recovered from this.  Patient began having neck swelling and pain 4 weeks ago and presented for evaluations both in the clinic in the emergency room.  Evaluations were not revealing. ?  ?At that time she was not having hemoptysis but over the past week she has had increased coughing up of blood combination with progressive swelling in the neck in the supraclavicular areas.  She is also having difficulty with swallowing as well.  Patient does smoke a pack a day of cigarettes and is under a lot of stress unable to reduce her tobacco intake.  She does have some dyspnea on exertion but no wheezing.  She is never been diagnosed with pneumonia.  She did have a CT of the chest in February of this year showing some patchy basilar groundglass changes.  She has also chronic subpleural nodules which have been  unchanged compared to previous exam in 2021 versus February 2022.  The patient's been out of work the last 2 weeks because of the difficulty she is now experiencing. ?  ?Patient's past medical history significant for cervical cancer in her self and family history of breast cancer.  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 ?  ?Note ED visit 01/01/21 ?Katie Sherman is a 56 y.o. female with past medical history of tobacco use, cervical cancer.  Patient presents to the emergency department with a chief complaint of swelling and tenderness to her neck.  Patient reports that her symptoms started on Wednesday and were located on the left side of her neck.  Patient also endorsed that she was having some difficulty and pain when swallowing liquids but "only on the left side of my throat."  She reports that Thursday morning swelling and pain wear to both sides of her throat.  Reports progressive worsening of symptoms on Friday.  Patient reports that she tried to take your prescribed Acacian Friday morning but it "felt like my throat was closing up.".  States that it felt like one of her pills had become stuck in her throat.  Patient reports that she "stuck a wooden spoon down my throat," in an attempt to push the pill further  along.  Patient reports that she had an episode of vomiting after this.   ?  ?At present patient reports that pain is only located to the right side of her neck.  Patient rates her pain 10/10 on the pain scale.  Patient reports that pain is worse with movement and touch.  Patient reports that she has had some relief with ibuprofen earlier in the week but has not taken any today. ?  ? ?08/26/21 phone note ?I attempted to call the patient to inform her about her MRI results. Note there was a  phone virtual visit with Dr Kipp Brood who spoke to the patient around 2pm today but due to high ED volume the MRI had yet to read at that point. ?I have received two very long MyChart messages from the patient this afternoon expressing her frustrations ?  ?  ?  ? I spoke to Dr Camie Patience, Chief of Radiology [facility] on the results  He and I reviewed all prior imaging to date. ?  ?The process appears to have started behind the sternoclavicular joint and first rib at the cartilaginous junction posteriorly.  It has significantly regressed and all the reactive mediastinal cervical and axillary adenopathy has disappeared   There is no cervical spine pathology.  The interna Jugular is patent .  As this was a combined MRI MRA study all great vessels to and from the heart are NORMAL ?  ?This does NOT represent Cancer    DR Lin Landsman had an interesting hypothesis, what if this were from some type of costochondritis that resulted in cartilaginous fracture and bleeding in the past of summer 2022.   The pt steadfastly denies trauma or IVDU .    ?  ?In any case it is not cancer but likely inflammatory. ?  ?I have tried to call the patient multiple times.   She is living with a friend off [address]. ?  ?  ?I have left two phone msg's on the patients cell  there is no answer,  I have tried to call at least 8 times between 4p and now to no avail ?  ?I am copying this phone note with Drs Lamonte Sakai and Kipp Brood. ? 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  She is well aware it at about the current MRI as she had a visit with Dr. Kipp Brood over the phone he explained the current findings to her. ? ?She was not available when I tried to call her 2 weeks ago.  She agrees today to become primary care under my coverage as well as follow-up here for her pulmonary status.  She states she is not having swallowing difficulty as she was before but has a  burning sensation on the back of her neck she has bad dentition.  She feels a squeezing sensation in the chest neck area and pain in the left clavicle.  She also notes a slight increase swelling in the right side of the neck as well.  She has left-sided shoulder pain as well.  She states she did fall early in the spring down her stairs and tried to grab with her left hand she does not recall specifically injuring her shoulder at that visit.  Patient subsequent developed acute onset of the symptoms in the neck in June 2022.  She then saw her primary care provider who then referred her to me.  See my July  2022 note.  At that point we ordered imaging of the neck with a CT scan.  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  And other findings of mass effect behind the left clavicle and also involvement of the tip of the first rib on the left and mediastinal adenopathy and mass effect under the chest wall.  Serial imagings over a period of time including PET scan and 2 other MRIs showed the mass lesion is reducing in size and the most recent MRI in February of this year showed marked reduction of all areas of involvement. ? ?Patient denies any infectious disease history she denies any IV drug use.  No history of tuberculosis.  No fever chills or sweats.  Her weight is down somewhat.  She has not had any systemic blood draws. ? ?Patient denies any further hemoptysis this only occurred in the summer 2022.  She is requesting a refill on a nitro drip patch which helped with bursitis of the right hip which she got from orthopedics in 2019.  She also wants her acyclovir refilled pill.  She would like refills on her chronic pain medicine. ? ?She convened today with me today to see if any additional imaging studies can be performed and other evaluations be performed for this patient. ? ?10/03/21   ?Patient seen in return visit and states she still having some posterior neck pain but the shoulder pain and  sternoclavicular pain has nearly resolved.  She has complained of a new problem and that she cracked a molar in her jaw and has been taking some of the oxycodone for the jaw pain.  She states the indomethacin h

## 2021-10-03 ENCOUNTER — Other Ambulatory Visit: Payer: Self-pay

## 2021-10-03 ENCOUNTER — Ambulatory Visit: Payer: Self-pay | Attending: Critical Care Medicine | Admitting: Critical Care Medicine

## 2021-10-03 ENCOUNTER — Other Ambulatory Visit (HOSPITAL_COMMUNITY): Payer: Self-pay

## 2021-10-03 ENCOUNTER — Encounter: Payer: Self-pay | Admitting: Critical Care Medicine

## 2021-10-03 VITALS — BP 117/81 | HR 97 | Temp 98.9°F | Resp 18 | Ht 67.0 in | Wt 161.0 lb

## 2021-10-03 DIAGNOSIS — Z1231 Encounter for screening mammogram for malignant neoplasm of breast: Secondary | ICD-10-CM

## 2021-10-03 DIAGNOSIS — Z1211 Encounter for screening for malignant neoplasm of colon: Secondary | ICD-10-CM

## 2021-10-03 DIAGNOSIS — F17200 Nicotine dependence, unspecified, uncomplicated: Secondary | ICD-10-CM

## 2021-10-03 DIAGNOSIS — M940 Chondrocostal junction syndrome [Tietze]: Secondary | ICD-10-CM

## 2021-10-03 DIAGNOSIS — R042 Hemoptysis: Secondary | ICD-10-CM

## 2021-10-03 DIAGNOSIS — M009 Pyogenic arthritis, unspecified: Secondary | ICD-10-CM

## 2021-10-03 DIAGNOSIS — R222 Localized swelling, mass and lump, trunk: Secondary | ICD-10-CM

## 2021-10-03 DIAGNOSIS — R1313 Dysphagia, pharyngeal phase: Secondary | ICD-10-CM

## 2021-10-03 MED ORDER — OXYCODONE-ACETAMINOPHEN 10-325 MG PO TABS
1.0000 | ORAL_TABLET | Freq: Three times a day (TID) | ORAL | 0 refills | Status: DC | PRN
  Filled 2021-10-03: qty 70, 24d supply, fill #0

## 2021-10-03 NOTE — Assessment & Plan Note (Signed)
This has resolved.

## 2021-10-03 NOTE — Assessment & Plan Note (Signed)
Improved with Indocin will discontinue the Indocin because of GI side effects and will begin to wean opioids ?

## 2021-10-03 NOTE — Assessment & Plan Note (Signed)
? ? ??   Current smoking consumption amount: 1 pack a day ? ?? Dicsussion on advise to quit smoking and smoking impacts: Lung impacts ? ?? Patient's willingness to quit: Not ready to quit ? ?? Methods to quit smoking discussed: Behavioral modification ? ?? Medication management of smoking session drugs discussed: Not indicated ? ?? Resources provided:  AVS  ? ?? Setting quit date not established ? ?? Follow-up arranged 2 months ? ? ?Time spent counseling the patient: 5 minutes ? ?

## 2021-10-03 NOTE — Patient Instructions (Signed)
A mammogram will be ordered for April 7 screening event ? ?Pick up a colon cancer screening kit at the lab on the way out ? ?Refills on the pain medicine were sent to [facility] outpatient pharmacy note the dose was reduced to 70 tablets from 90 try to space these out more ? ?Please do shoulder exercises per attachment to get the muscles in the back stretched out ? ?We will schedule you for a Pap smear with one of the other providers in the clinic sometime in the next 3 months ? ?Return to Dr. Joya Gaskins 2 months ? ? ?
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
## 2021-10-03 NOTE — Assessment & Plan Note (Signed)
Resolved

## 2021-10-03 NOTE — Progress Notes (Signed)
Patient has taken medication and has eaten today. ?Patient reports chronic neck pain on the L side and new onset of tooth pain presenting 1 week ago from L bottom tooth. ? ?

## 2021-10-04 ENCOUNTER — Other Ambulatory Visit: Payer: Self-pay

## 2021-10-06 ENCOUNTER — Other Ambulatory Visit: Payer: Self-pay

## 2021-10-06 ENCOUNTER — Encounter: Payer: Self-pay | Admitting: Critical Care Medicine

## 2021-10-06 MED ORDER — CYCLOBENZAPRINE HCL 10 MG PO TABS
10.0000 mg | ORAL_TABLET | Freq: Three times a day (TID) | ORAL | 0 refills | Status: DC | PRN
  Filled 2021-10-06 – 2021-10-07 (×3): qty 30, 10d supply, fill #0

## 2021-10-07 ENCOUNTER — Other Ambulatory Visit (HOSPITAL_COMMUNITY): Payer: Self-pay

## 2021-10-11 ENCOUNTER — Other Ambulatory Visit: Payer: Self-pay

## 2021-10-13 ENCOUNTER — Ambulatory Visit
Admission: RE | Admit: 2021-10-13 | Discharge: 2021-10-13 | Disposition: A | Discharge status: Home / Self Care | Payer: No Typology Code available for payment source | Source: Ambulatory Visit | Attending: Obstetrics and Gynecology | Admitting: Obstetrics and Gynecology

## 2021-10-13 DIAGNOSIS — Z1231 Encounter for screening mammogram for malignant neoplasm of breast: Secondary | ICD-10-CM

## 2021-10-14 ENCOUNTER — Encounter: Payer: Self-pay | Admitting: Critical Care Medicine

## 2021-10-14 ENCOUNTER — Other Ambulatory Visit (HOSPITAL_COMMUNITY): Payer: Self-pay

## 2021-10-14 ENCOUNTER — Other Ambulatory Visit: Payer: Self-pay | Admitting: Family Medicine

## 2021-10-14 MED ORDER — VALACYCLOVIR HCL 500 MG PO TABS
500.0000 mg | ORAL_TABLET | Freq: Two times a day (BID) | ORAL | 1 refills | Status: DC
  Filled 2021-10-14: qty 60, 30d supply, fill #0

## 2021-10-17 ENCOUNTER — Other Ambulatory Visit (HOSPITAL_COMMUNITY): Payer: Self-pay

## 2021-10-18 ENCOUNTER — Other Ambulatory Visit (HOSPITAL_COMMUNITY): Payer: Self-pay

## 2021-10-19 ENCOUNTER — Ambulatory Visit: Payer: Medicaid Other | Admitting: Physician Assistant

## 2021-10-22 ENCOUNTER — Other Ambulatory Visit: Payer: Self-pay | Admitting: Critical Care Medicine

## 2021-10-22 MED ORDER — VITAMIN D (ERGOCALCIFEROL) 1.25 MG (50000 UNIT) PO CAPS
50000.0000 [IU] | ORAL_CAPSULE | ORAL | 5 refills | Status: DC
  Filled 2021-10-22: qty 4, 28d supply, fill #0
  Filled 2021-11-15: qty 4, 28d supply, fill #1
  Filled 2021-12-16: qty 4, 28d supply, fill #2
  Filled 2022-01-10: qty 4, 28d supply, fill #3
  Filled 2022-02-07: qty 4, 28d supply, fill #4

## 2021-10-24 ENCOUNTER — Other Ambulatory Visit (HOSPITAL_COMMUNITY): Payer: Self-pay

## 2021-10-26 ENCOUNTER — Encounter: Payer: Self-pay | Admitting: Critical Care Medicine

## 2021-10-27 ENCOUNTER — Other Ambulatory Visit (HOSPITAL_COMMUNITY): Payer: Self-pay

## 2021-10-27 MED ORDER — OXYCODONE-ACETAMINOPHEN 10-325 MG PO TABS
1.0000 | ORAL_TABLET | Freq: Three times a day (TID) | ORAL | 0 refills | Status: DC | PRN
  Filled 2021-10-27 – 2021-11-11 (×4): qty 60, 20d supply, fill #0

## 2021-10-27 MED ORDER — OXYCODONE-ACETAMINOPHEN 10-325 MG PO TABS
1.0000 | ORAL_TABLET | Freq: Three times a day (TID) | ORAL | 0 refills | Status: DC | PRN
  Filled 2021-10-27: qty 50, 17d supply, fill #0

## 2021-10-27 NOTE — Addendum Note (Signed)
Addended by: Elsie Stain on: 10/27/2021 03:42 PM ? ? Modules accepted: Orders ? ?

## 2021-11-05 ENCOUNTER — Other Ambulatory Visit: Payer: Self-pay | Admitting: Critical Care Medicine

## 2021-11-05 ENCOUNTER — Other Ambulatory Visit (HOSPITAL_COMMUNITY): Payer: Self-pay | Admitting: Psychiatry

## 2021-11-05 DIAGNOSIS — F4310 Post-traumatic stress disorder, unspecified: Secondary | ICD-10-CM

## 2021-11-07 ENCOUNTER — Other Ambulatory Visit (HOSPITAL_COMMUNITY): Payer: Self-pay

## 2021-11-07 ENCOUNTER — Encounter: Payer: Self-pay | Admitting: Critical Care Medicine

## 2021-11-07 DIAGNOSIS — F4310 Post-traumatic stress disorder, unspecified: Secondary | ICD-10-CM

## 2021-11-07 DIAGNOSIS — F319 Bipolar disorder, unspecified: Secondary | ICD-10-CM

## 2021-11-07 MED ORDER — CYCLOBENZAPRINE HCL 10 MG PO TABS
10.0000 mg | ORAL_TABLET | Freq: Three times a day (TID) | ORAL | 0 refills | Status: DC | PRN
  Filled 2021-11-07: qty 30, 10d supply, fill #0

## 2021-11-07 MED ORDER — TRAZODONE HCL 50 MG PO TABS
50.0000 mg | ORAL_TABLET | Freq: Every evening | ORAL | 0 refills | Status: DC | PRN
  Filled 2021-11-07: qty 30, 30d supply, fill #0

## 2021-11-07 MED ORDER — QUETIAPINE FUMARATE 200 MG PO TABS
200.0000 mg | ORAL_TABLET | Freq: Every day | ORAL | 2 refills | Status: DC
  Filled 2021-11-07: qty 30, 30d supply, fill #0
  Filled 2021-12-16: qty 30, 30d supply, fill #1
  Filled 2022-01-10: qty 30, 30d supply, fill #2

## 2021-11-07 MED ORDER — LAMOTRIGINE 100 MG PO TABS
300.0000 mg | ORAL_TABLET | Freq: Every day | ORAL | 2 refills | Status: DC
  Filled 2021-11-07 – 2021-11-15 (×2): qty 90, 30d supply, fill #0
  Filled 2021-12-16: qty 90, 30d supply, fill #1
  Filled 2022-01-10: qty 90, 30d supply, fill #2

## 2021-11-07 NOTE — Telephone Encounter (Signed)
Requested medication (s) are due for refill today: Yes ? ?Requested medication (s) are on the active medication list: Yes ? ?Last refill:  10/06/21 ? ?Future visit scheduled: Yes ? ?Notes to clinic:  See request. ? ? ? ?Requested Prescriptions  ?Pending Prescriptions Disp Refills  ? cyclobenzaprine (FLEXERIL) 10 MG tablet 30 tablet 0  ?  Sig: Take 1 tablet (10 mg total) by mouth 3 (three) times daily as needed for muscle spasms.  ?  ? Not Delegated - Analgesics:  Muscle Relaxants Failed - 11/05/2021  4:55 AM  ?  ?  Failed - This refill cannot be delegated  ?  ?  Passed - Valid encounter within last 6 months  ?  Recent Outpatient Visits   ? ?      ? 1 month ago Costochondritis  ? [place] Elsie Stain, MD  ? 2 months ago Mass of left chest wall  ? [place] Elsie Stain, MD  ? 3 months ago COVID-19 virus infection  ? [place] Ladell Pier, MD  ? 10 months ago Hemoptysis  ? [place] Elsie Stain, MD  ? 10 months ago Cervicalgia  ? [place] Charlott Rakes, MD  ? ?  ?  ?Future Appointments   ? ?        ? In 1 month Joya Gaskins Burnett Harry, MD [place]  ? ?  ? ? ?  ?  ?  ? ?

## 2021-11-08 ENCOUNTER — Encounter: Payer: Self-pay | Admitting: Critical Care Medicine

## 2021-11-09 ENCOUNTER — Other Ambulatory Visit (HOSPITAL_COMMUNITY): Payer: Self-pay

## 2021-11-11 ENCOUNTER — Other Ambulatory Visit (HOSPITAL_COMMUNITY): Payer: Self-pay

## 2021-11-15 ENCOUNTER — Encounter: Payer: Self-pay | Admitting: Critical Care Medicine

## 2021-11-15 ENCOUNTER — Other Ambulatory Visit (HOSPITAL_COMMUNITY): Payer: Self-pay

## 2021-11-15 DIAGNOSIS — U071 COVID-19: Secondary | ICD-10-CM

## 2021-11-15 MED ORDER — ALBUTEROL SULFATE HFA 108 (90 BASE) MCG/ACT IN AERS
2.0000 | INHALATION_SPRAY | Freq: Four times a day (QID) | RESPIRATORY_TRACT | 2 refills | Status: DC | PRN
  Filled 2021-11-15: qty 8.5, 25d supply, fill #0
  Filled 2022-01-10: qty 8.5, 25d supply, fill #1

## 2021-11-15 MED ORDER — VALACYCLOVIR HCL 500 MG PO TABS
500.0000 mg | ORAL_TABLET | Freq: Every day | ORAL | 1 refills | Status: DC
  Filled 2021-11-15: qty 30, 30d supply, fill #0
  Filled 2021-12-16: qty 30, 30d supply, fill #1
  Filled 2022-01-10: qty 30, 30d supply, fill #2

## 2021-11-15 NOTE — Progress Notes (Signed)
Albuterol and valtrex sent ?

## 2021-11-18 ENCOUNTER — Encounter: Payer: Self-pay | Admitting: Critical Care Medicine

## 2021-11-19 ENCOUNTER — Other Ambulatory Visit: Payer: Self-pay | Admitting: Critical Care Medicine

## 2021-11-19 ENCOUNTER — Encounter: Payer: Self-pay | Admitting: Critical Care Medicine

## 2021-11-19 MED ORDER — DOXYCYCLINE HYCLATE 100 MG PO TABS
100.0000 mg | ORAL_TABLET | Freq: Two times a day (BID) | ORAL | 0 refills | Status: DC
  Filled 2021-11-19: qty 28, 14d supply, fill #0

## 2021-11-19 MED ORDER — IBUPROFEN 600 MG PO TABS
600.0000 mg | ORAL_TABLET | Freq: Three times a day (TID) | ORAL | 0 refills | Status: DC | PRN
  Filled 2021-11-19: qty 30, 10d supply, fill #0

## 2021-11-21 ENCOUNTER — Other Ambulatory Visit (HOSPITAL_COMMUNITY): Payer: Self-pay

## 2021-11-28 ENCOUNTER — Other Ambulatory Visit (HOSPITAL_COMMUNITY): Payer: Self-pay

## 2021-11-28 ENCOUNTER — Encounter: Payer: Self-pay | Admitting: Critical Care Medicine

## 2021-11-29 ENCOUNTER — Other Ambulatory Visit (HOSPITAL_COMMUNITY): Payer: Self-pay

## 2021-11-29 MED ORDER — OXYCODONE-ACETAMINOPHEN 10-325 MG PO TABS
1.0000 | ORAL_TABLET | Freq: Three times a day (TID) | ORAL | 0 refills | Status: DC | PRN
  Filled 2021-11-29: qty 50, 17d supply, fill #0

## 2021-12-13 ENCOUNTER — Ambulatory Visit: Payer: Medicaid Other | Admitting: Critical Care Medicine

## 2021-12-13 NOTE — Progress Notes (Incomplete)
Established Patient Office Visit  Subjective:  Patient ID: Katie Sherman, female    DOB: 06/02/1966  Age: 56 y.o. MRN: [id number]  CC: Chronic pain in the left side of the neck and clavicle  HPI  09/05/21 Katie Sherman presents for primary care to establish and follow-up of chronic condition of the chest and neck. Below are series of the previous documentations I have on this patient when I have seen her 1 time only in July 2022 for pulmonary consultation by her patient's primary care provider Dr. Wynetta Emery.  Subsequent to this the patient was cared for by pulmonary medicine Dr. Lamonte Sakai and thoracic surgery Dr. Kipp Brood and was seen 1 time by infectious disease Dr. Linus Salmons.  01/11/21 This a 56 year old female referred for hemoptysis difficulty swallowing and neck fullness, neck pain and supraclavicular fullness bilaterally of 4 weeks duration.  The patient's primary care provider is Dr. Wynetta Emery.  The patient's hemoptysis is pure blood with clots not mixed with mucus.  She had an episode of minimal hemoptysis a year ago last summer in 2021.  This went away she then developed COVID to beginning portion of 2022 recovered from this.  Patient began having neck swelling and pain 4 weeks ago and presented for evaluations both in the clinic in the emergency room.  Evaluations were not revealing.   At that time she was not having hemoptysis but over the past week she has had increased coughing up of blood combination with progressive swelling in the neck in the supraclavicular areas.  She is also having difficulty with swallowing as well.  Patient does smoke a pack a day of cigarettes and is under a lot of stress unable to reduce her tobacco intake.  She does have some dyspnea on exertion but no wheezing.  She is never been diagnosed with pneumonia.  She did have a CT of the chest in February of this year showing some patchy basilar groundglass changes.  She has also chronic subpleural nodules which have been  unchanged compared to previous exam in 2021 versus February 2022.  The patient's been out of work the last 2 weeks because of the difficulty she is now experiencing.   Patient's past medical history significant for cervical cancer in her self and family history of breast cancer.  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   Note ED visit 01/01/21 Katie Sherman is a 56 y.o. female with past medical history of tobacco use, cervical cancer.  Patient presents to the emergency department with a chief complaint of swelling and tenderness to her neck.  Patient reports that her symptoms started on Wednesday and were located on the left side of her neck.  Patient also endorsed that she was having some difficulty and pain when swallowing liquids but "only on the left side of my throat."  She reports that Thursday morning swelling and pain wear to both sides of her throat.  Reports progressive worsening of symptoms on Friday.  Patient reports that she tried to take your prescribed Acacian Friday morning but it "felt like my throat was closing up.".  States that it felt like one of her pills had become stuck in her throat.  Patient reports that she "stuck a wooden spoon down my throat," in an attempt to push the pill further  along.  Patient reports that she had an episode of vomiting after this.     At present patient reports that pain is only located to the right side of her neck.  Patient rates her pain 10/10 on the pain scale.  Patient reports that pain is worse with movement and touch.  Patient reports that she has had some relief with ibuprofen earlier in the week but has not taken any today.    08/26/21 phone note I attempted to call the patient to inform her about her MRI results. Note there was a  phone virtual visit with Dr Kipp Brood who spoke to the patient around 2pm today but due to high ED volume the MRI had yet to read at that point. I have received two very long MyChart messages from the patient this afternoon expressing her frustrations        I spoke to Dr Camie Patience, Chief of Radiology [facility] on the results  He and I reviewed all prior imaging to date.   The process appears to have started behind the sternoclavicular joint and first rib at the cartilaginous junction posteriorly.  It has significantly regressed and all the reactive mediastinal cervical and axillary adenopathy has disappeared   There is no cervical spine pathology.  The interna Jugular is patent .  As this was a combined MRI MRA study all great vessels to and from the heart are NORMAL   This does NOT represent Cancer    DR Lin Landsman had an interesting hypothesis, what if this were from some type of costochondritis that resulted in cartilaginous fracture and bleeding in the past of summer 2022.   The pt steadfastly denies trauma or IVDU .      In any case it is not cancer but likely inflammatory.   I have tried to call the patient multiple times.   She is living with a friend off [address].     I have left two phone msg's on the patients cell  there is no answer,  I have tried to call at least 8 times between 4p and now to no avail   I am copying this phone note with Drs Lamonte Sakai and Kipp Brood.  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  She is well aware it at about the current MRI as she had a visit with Dr. Kipp Brood over the phone he explained the current findings to her.  She was not available when I tried to call her 2 weeks ago.  She agrees today to become primary care under my coverage as well as follow-up here for her pulmonary status.  She states she is not having swallowing difficulty as she was before but has a  burning sensation on the back of her neck she has bad dentition.  She feels a squeezing sensation in the chest neck area and pain in the left clavicle.  She also notes a slight increase swelling in the right side of the neck as well.  She has left-sided shoulder pain as well.  She states she did fall early in the spring down her stairs and tried to grab with her left hand she does not recall specifically injuring her shoulder at that visit.  Patient subsequent developed acute onset of the symptoms in the neck in June 2022.  She then saw her primary care provider who then referred her to me.  See my July  2022 note.  At that point we ordered imaging of the neck with a CT scan.  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  And other findings of mass effect behind the left clavicle and also involvement of the tip of the first rib on the left and mediastinal adenopathy and mass effect under the chest wall.  Serial imagings over a period of time including PET scan and 2 other MRIs showed the mass lesion is reducing in size and the most recent MRI in February of this year showed marked reduction of all areas of involvement.  Patient denies any infectious disease history she denies any IV drug use.  No history of tuberculosis.  No fever chills or sweats.  Her weight is down somewhat.  She has not had any systemic blood draws.  Patient denies any further hemoptysis this only occurred in the summer 2022.  She is requesting a refill on a nitro drip patch which helped with bursitis of the right hip which she got from orthopedics in 2019.  She also wants her acyclovir refilled pill.  She would like refills on her chronic pain medicine.  She convened today with me today to see if any additional imaging studies can be performed and other evaluations be performed for this patient.  10/03/21   Patient seen in return visit and states she still having some posterior neck pain but the shoulder pain and  sternoclavicular pain has nearly resolved.  She has complained of a new problem and that she cracked a molar in her jaw and has been taking some of the oxycodone for the jaw pain.  She states the indomethacin has helped to some degree but is causing GI upset.  Patient is in need of a mammogram and colon cancer screening and agrees to receive these.  On arrival blood pressure is good 117/81.  The patient still smokes a pack a day of cigarettes.  She also needs a Pap smear.  She has upcoming in April and May Social Security determination assessments for disability with orthopedics and psychiatry.  6/6    Respiratory   RESOLVED: Pharyngeal dysphagia    This has resolved        Musculoskeletal and Integument   Costochondritis    Improved with Indocin will discontinue the Indocin because of GI side effects and will begin to wean opioids      RESOLVED: Septic arthritis of left sternoclavicular joint (HCC)    No evidence of septic arthritis work-up negative there is likely traumatic injury to the costochondral junction and this has improved and post trauma costochondritis has resolved      Relevant Medications   oxyCODONE-acetaminophen (PERCOCET) 10-325 MG tablet     Other   Tobacco dependence       Current smoking consumption amount: 1 pack a day  Dicsussion on advise to quit smoking and smoking impacts: Lung impacts  Patient's willingness to quit: Not ready to quit  Methods to quit smoking discussed: Behavioral modification  Medication management of smoking session drugs discussed: Not indicated  Resources provided:  AVS   Setting quit date not established  Follow-up arranged 2 months   Time spent counseling the patient: 5 minutes       RESOLVED: Hemoptysis    Resolved      RESOLVED: Mass of left chest wall    This has resolved      Other Visit Diagnoses     Colon cancer screening    -  Primary   Relevant Orders   Fecal occult blood, imunochemical   Encounter  for screening mammogram for malignant neoplasm of breast       Relevant Orders   MM DIGITAL SCREENING BILATERAL       Past Medical History:  Diagnosis Date  . Bilateral carpal tunnel syndrome 03/19/2018  . Cervical cancer ([place])   . Cervical cancer ([place])   . Hemoptysis 01/11/2021  . History of exercise stress test    ETT-Echo 5/18: normal EF, no ischemia  . History of loop recorder    currently on person  . Kidney stone   . Pharyngeal dysphagia 01/11/2021    Past Surgical History:  Procedure Laterality Date  . CERVICAL CONE BIOPSY    . TUBAL LIGATION      Family History  Problem Relation Age of Onset  . Heart attack Mother 7  . Heart disease Father   . Heart attack Father 54  . Liver cancer Father   . Bone cancer Other   . Breast cancer Maternal Aunt        Diagnosed in 22's  . Bone cancer Maternal Aunt     Social History   Socioeconomic History  . Marital status: Widowed    Spouse name: Not on file  . Number of children: Not on file  . Years of education: Not on file  . Highest education level: Not on file  Occupational History  . Occupation: unemployed  Tobacco Use  . Smoking status: Every Day    Packs/day: 0.50    Years: 36.00    Pack years: 18.00    Types: Cigarettes    Start date: 72  . Smokeless tobacco: Never  . Tobacco comments:    Smokes 1 pack a day ARJ 01/20/21  Vaping Use  . Vaping Use: Never used  Substance and Sexual Activity  . Alcohol use: No  . Drug use: Yes    Types: Marijuana    Comment: stopped 04/06/2017  . Sexual activity: Not Currently    Birth control/protection: None  Other Topics Concern  . Not on file  Social History Narrative   Prior traffic control specialist - unemployed now   Widow   5 children, 10 grandchildren   Moved here from [place] 2013   Social Determinants of Health   Financial Resource Strain: Not on file  Food Insecurity: Not on file  Transportation Needs: Not on file  Physical Activity: Not on file   Stress: Not on file  Social Connections: Not on file  Intimate Partner Violence: Not on file    Outpatient Medications Prior to Visit  Medication Sig Dispense Refill  . albuterol (VENTOLIN HFA) 108 (90 Base) MCG/ACT inhaler Inhale 2 puffs into the lungs every 6 (six) hours as needed for wheezing or shortness of breath. 8.5 g 2  . cyclobenzaprine (FLEXERIL) 10 MG tablet Take 1 tablet (10 mg total) by mouth 3 (three) times daily as needed for muscle spasms. 30 tablet 0  . doxycycline (VIBRA-TABS) 100 MG tablet Take 1 tablet (100 mg total) by mouth 2 (two) times daily. 28 tablet 0  . ibuprofen (ADVIL) 600 MG tablet Take 1 tablet (600 mg total) by mouth every 8 (eight) hours as needed. 30 tablet 0  . lamoTRIgine (LAMICTAL) 100 MG tablet Take 3 tablets (300 mg total) by mouth daily. 90 tablet 2  . nitroGLYCERIN (NITRODUR - DOSED IN MG/24 HR) 0.2 mg/hr patch Apply 1/4 patch daily to the affected area 30 patch  1  . omeprazole (PRILOSEC) 20 MG capsule Take 1 capsule (20 mg total) by mouth daily. 90 capsule 1  . oxyCODONE-acetaminophen (PERCOCET) 10-325 MG tablet Take 1 tablet by mouth every 8 (eight) hours as needed for pain. 50 tablet 0  . pravastatin (PRAVACHOL) 40 MG tablet Take 1 tablet (40 mg total) by mouth daily. 30 tablet 3  . QUEtiapine (SEROQUEL) 200 MG tablet Take 1 tablet (200 mg total) by mouth at bedtime. 30 tablet 2  . traZODone (DESYREL) 50 MG tablet Take 1 tablet (50 mg total) by mouth at bedtime as needed for sleep. 30 tablet 0  . valACYclovir (VALTREX) 500 MG tablet Take 1 tablet (500 mg total) by mouth daily. 60 tablet 1  . Vitamin D, Ergocalciferol, (DRISDOL) 1.25 MG (50000 UNIT) CAPS capsule Take 1 capsule (50,000 Units total) by mouth every 7 (seven) days. 5 capsule 5   No facility-administered medications prior to visit.    No Known Allergies  ROS Review of Systems  Constitutional:  Positive for fatigue. Negative for chills, diaphoresis, fever and unexpected weight  change.  HENT:  Positive for dental problem. Negative for ear pain, postnasal drip, rhinorrhea, sinus pressure, sore throat, trouble swallowing and voice change.   Eyes: Negative.   Respiratory: Negative.  Negative for apnea, cough, choking, chest tightness, shortness of breath, wheezing and stridor.        No further hemoptysis  Cardiovascular:  Negative for chest pain, palpitations and leg swelling.  Gastrointestinal: Negative.  Negative for abdominal distention, abdominal pain, nausea and vomiting.  Genitourinary: Negative.   Musculoskeletal:  Positive for neck pain and neck stiffness. Negative for arthralgias and myalgias.  Skin: Negative.  Negative for rash.  Allergic/Immunologic: Negative.  Negative for environmental allergies and food allergies.  Neurological: Negative.  Negative for dizziness, syncope, weakness and headaches.  Hematological: Negative.  Negative for adenopathy. Does not bruise/bleed easily.  Psychiatric/Behavioral:  Positive for dysphoric mood. Negative for agitation and sleep disturbance. The patient is not nervous/anxious.      Objective:    Physical Exam Vitals reviewed.  Constitutional:      Appearance: Normal appearance. She is well-developed. She is not diaphoretic.  HENT:     Head: Normocephalic and atraumatic.     Nose: Nose normal. No nasal deformity, septal deviation, mucosal edema or rhinorrhea.     Right Sinus: No maxillary sinus tenderness or frontal sinus tenderness.     Left Sinus: No maxillary sinus tenderness or frontal sinus tenderness.     Mouth/Throat:     Mouth: Mucous membranes are moist.     Pharynx: Oropharynx is clear. No oropharyngeal exudate.     Comments: Poor dentition cracked molars Eyes:     General: No scleral icterus.    Conjunctiva/sclera: Conjunctivae normal.     Pupils: Pupils are equal, round, and reactive to light.  Neck:     Thyroid: No thyromegaly.     Vascular: No carotid bruit or JVD.     Trachea: Trachea normal.  No tracheal tenderness or tracheal deviation.  Cardiovascular:     Rate and Rhythm: Normal rate and regular rhythm.     Chest Wall: PMI is not displaced.     Pulses: Normal pulses. No decreased pulses.     Heart sounds: Normal heart sounds, S1 normal and S2 normal. Heart sounds not distant. No murmur heard. No systolic murmur is present.  No diastolic murmur is present.    No friction rub. No gallop. No S3 or S4  sounds.  Pulmonary:     Effort: Pulmonary effort is normal. No tachypnea, accessory muscle usage or respiratory distress.     Breath sounds: Normal breath sounds. No stridor. No decreased breath sounds, wheezing, rhonchi or rales.     Comments: Chest wall tenderness has resolved Area circled improved Chest:     Chest wall: No mass, lacerations, deformity, swelling, tenderness, crepitus or edema. There is no dullness to percussion.  Breasts:    Right: Normal.     Left: Normal.    Abdominal:     General: Bowel sounds are normal. There is no distension.     Palpations: Abdomen is soft. Abdomen is not rigid.     Tenderness: There is no abdominal tenderness. There is no guarding or rebound.  Musculoskeletal:        General: Normal range of motion.     Cervical back: Normal range of motion and neck supple. No edema, erythema or rigidity. No muscular tenderness. Normal range of motion.  Lymphadenopathy:     Head:     Right side of head: No submental or submandibular adenopathy.     Left side of head: No submental or submandibular adenopathy.     Cervical: No cervical adenopathy.     Upper Body:     Left upper body: No supraclavicular, axillary or pectoral adenopathy.  Skin:    General: Skin is warm and dry.     Coloration: Skin is not pale.     Findings: No rash.     Nails: There is no clubbing.  Neurological:     Mental Status: She is alert and oriented to person, place, and time.     Sensory: No sensory deficit.  Psychiatric:        Mood and Affect: Mood normal.         Speech: Speech normal.        Behavior: Behavior normal.    LMP 04/29/2012  Wt Readings from Last 3 Encounters:  10/03/21 161 lb (73 kg)  09/05/21 168 lb 12.8 oz (76.6 kg)  07/21/21 178 lb 12.8 oz (81.1 kg)     Health Maintenance Due  Topic Date Due  . COLON CANCER SCREENING ANNUAL FOBT  Never done  . PAP SMEAR-Modifier  11/21/2019    There are no preventive care reminders to display for this patient.  Lab Results  Component Value Date   TSH 2.640 09/05/2021   Lab Results  Component Value Date   WBC 9.1 09/05/2021   HGB 14.7 09/05/2021   HCT 45.6 09/05/2021   MCV 95 09/05/2021   PLT 472 (H) 09/05/2021   Lab Results  Component Value Date   NA 138 09/05/2021   K 5.3 (H) 09/05/2021   CO2 22 09/05/2021   GLUCOSE 90 09/05/2021   BUN 12 09/05/2021   CREATININE 0.85 09/05/2021   BILITOT <0.2 09/05/2021   ALKPHOS 115 09/05/2021   AST 14 09/05/2021   ALT 10 09/05/2021   PROT 7.3 09/05/2021   ALBUMIN 4.4 09/05/2021   CALCIUM 9.3 09/05/2021   ANIONGAP 11 03/18/2021   EGFR 81 09/05/2021   Lab Results  Component Value Date   CHOL 203 (H) 09/05/2021   Lab Results  Component Value Date   HDL 56 09/05/2021   Lab Results  Component Value Date   LDLCALC 117 (H) 09/05/2021   Lab Results  Component Value Date   TRIG 170 (H) 09/05/2021   Lab Results  Component Value Date   CHOLHDL 3.6  09/05/2021   Lab Results  Component Value Date   HGBA1C 5.3 01/01/2020  CT Neck 01/19/21 EXAM: CT NECK WITH CONTRAST   TECHNIQUE: Multidetector CT imaging of the neck was performed using the standard protocol following the bolus administration of intravenous contrast.   CONTRAST:  164m OMNIPAQUE IOHEXOL 350 MG/ML SOLN   COMPARISON:  08/19/2020   FINDINGS: Pharynx and larynx: No evidence of primary mass or inflammation along the mucosa   Salivary glands: No inflammation, mass, or stone.   Thyroid: Normal.   Lymph nodes: Bilateral jugular chain adenopathy. A right  level 4 node measures 19 mm maximum on axial images. There is an infiltrative process at the left thoracic inlet and upper mediastinum, with sheet like soft tissue thickening posterior to the jugular notch and manubrium. Just anterior to the left IJ brachiocephalic confluence is a cystic appearing component measuring 12 mm. Soft tissue density extends anterior to the left first costochondral junction. There is reticulation and adjacent left upper lobe with partially covered layering left pleural effusion.   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   Limited intracranial: Negative   Visualized orbits: Negative   Mastoids and visualized paranasal sinuses: Clear   Skeleton: No erosion of ribs or joint spaces adjacent to the inflammation, although the left first costochondral junction is wider than the right.   Upper chest: Reticular opacity in the subpleural left upper lobe adjacent to the chest wall process.   These results will be called to the ordering clinician or representative by the Radiologist Assistant, and communication documented in the PACS or [employer]   IMPRESSION: Infiltrative process centered on the left chest wall, especially at left first costochondral junction, with regional adenopathy , mediastinal fat infiltration, and apical pulmonary opacity. Atypical infection or malignancy are both possible.   MRI 05/2021 FINDINGS: Along the inferomedial margin of the left subclavius muscle for example on image 26 of series 24 and also shown on image 26 of series 18, we demonstrate a somewhat infiltrative 2.7 by 2.2 by 2.6 cm (volume = 8.1 cm^3) process. When I compare back to prior exams, the examinations of 01/18/2021 showed a substantially greater amount of abnormal enhancement in the retrosternal region, anterior mediastinum, and in the anterior left upper lobe adjacent to this lesion. By the time of the  02/03/2021 PET-CT, there was new bony destruction of the left anterior first rib but was not present on the earlier 01/27/2021 examinations. On today's exam there is substantial reduction in the overall previously enhancing soft tissue both in the subclavicular and subpectoral region and in the mediastinum, with anterior mediastinal and retrosternal involvement essentially resolved.   Moreover, the previous right level IV adenopathy in the lower neck shown on prior PET-CT has resolved. Please note that today's examination was not a diagnostic CT of the neck but does include the region of the prior enlarged right level IV lymph nodes. Prior AP window lymph node previously 1.1 cm in short axis diameter, currently 0.4 cm in short axis on image 46 series 17.   There is some accentuated enhancement medially in the left subclavian small cell compared to right for example on image 25 of series 24 which may be related to the underlying process.   The prior stenosis of the left subclavian vein and prior occlusion/stenosis of the left lower internal jugular vein appear to of substantially improved although there may still be some mild narrowing proximally in the left internal  jugular vein and left subclavian vein for example on image 23 series 24.   IMPRESSION: 1. Substantial improvement in the abnormal infiltrative process in the left medial infraclavicular region, along the inferior margin of the medial left subclavius muscle. On previous exams this process demonstrated substantial mediastinal, substernal, and anterior left upper lobe involvement on the CT scans from July, and on the PET-CT had progressed to bony destructive findings in the anterior left first rib. On today's examination, the pulmonary, mediastinal, and substernal components have resolved, rib involvement is now questionable, and the effect of this process on the adjacent internal jugular and subclavian veins is  substantially reduced. Moreover, the right level IV adenopathy and mediastinal adenopathy shown on the prior PET-CT has resolved. The remaining enhancing lesion measures up to 2.7 cm in long axis. The substantial improvement without known specific therapy would seem to suggest inflammatory or infectious process, but I do note that the patient did not have leukocytosis back in August. Possibility of an underlying mass or vascular malformation which previously bled causing inflammatory response without substantial leukocytosis is a possibility, although the aggressive findings previously present along the left anterior first rib with tended favor either tumor or infection rather than the sequela of a hematoma. Overall given the substantial improvement, surveillance might be a reasonable option although open biopsy to definitively sample and/or remove the underlying lesion with likewise be a very reasonable choice. MRI chest 08/25/21 FINDINGS: Bones/Joint/Cartilage:   The previously demonstrated process anteriorly in the upper left chest wall between the left clavicular head and anterior aspect of the left 1st rib shows continued improvement. There is mild residual asymmetric soft tissue enhancement in this area which extends around the left sternoclavicular joint. No progressive bone destruction identified. No evidence of soft tissue mass.   Ligaments:   No ligamentous abnormalities identified.   Muscles and Tendons:   The overlying pectoralis musculature appears normal.   Soft tissue:   As above, mild residual soft tissue enhancement in the area of the previously demonstrated abnormality. No fluid collection or soft tissue mass identified. The left subclavian and left internal jugular veins appear patent.   IMPRESSION: 1. Further improvement in the previously demonstrated process involving the left anterior chest wall centered at the left 1st costochondral junction. The  continued improvement favors a resolving inflammatory/infectious process. No mass lesion identified to suggest neoplasm. 2. This process was better demonstrated by CT, and if additional follow-is warranted clinically, consider neck CT with contrast.      Assessment & Plan:   Problem List Items Addressed This Visit   None No orders of the defined types were placed in this encounter.  We will try to get mammogram and colon cancer screening and Pap smears performed Follow-up: No follow-ups on file.    Asencion Noble, MD

## 2021-12-16 ENCOUNTER — Other Ambulatory Visit (HOSPITAL_COMMUNITY): Payer: Self-pay

## 2021-12-16 ENCOUNTER — Other Ambulatory Visit: Payer: Self-pay | Admitting: Critical Care Medicine

## 2021-12-16 DIAGNOSIS — F4310 Post-traumatic stress disorder, unspecified: Secondary | ICD-10-CM

## 2021-12-16 MED ORDER — IBUPROFEN 600 MG PO TABS
600.0000 mg | ORAL_TABLET | Freq: Three times a day (TID) | ORAL | 2 refills | Status: DC | PRN
Start: 2021-12-16 — End: 2022-02-07
  Filled 2021-12-16: qty 30, 10d supply, fill #0
  Filled 2022-01-10: qty 30, 10d supply, fill #1

## 2021-12-16 MED ORDER — CYCLOBENZAPRINE HCL 10 MG PO TABS
10.0000 mg | ORAL_TABLET | Freq: Three times a day (TID) | ORAL | 2 refills | Status: DC | PRN
  Filled 2021-12-16: qty 30, 10d supply, fill #0
  Filled 2022-01-10: qty 30, 10d supply, fill #1

## 2021-12-16 MED ORDER — TRAZODONE HCL 50 MG PO TABS
50.0000 mg | ORAL_TABLET | Freq: Every evening | ORAL | 2 refills | Status: DC | PRN
  Filled 2021-12-16: qty 30, 30d supply, fill #0
  Filled 2022-01-10: qty 30, 30d supply, fill #1

## 2021-12-26 ENCOUNTER — Encounter: Payer: Self-pay | Admitting: Critical Care Medicine

## 2021-12-26 ENCOUNTER — Other Ambulatory Visit (HOSPITAL_COMMUNITY): Payer: Self-pay

## 2021-12-26 MED ORDER — OXYCODONE-ACETAMINOPHEN 10-325 MG PO TABS
1.0000 | ORAL_TABLET | Freq: Three times a day (TID) | ORAL | 0 refills | Status: DC | PRN
  Filled 2021-12-26: qty 40, 14d supply, fill #0

## 2021-12-28 ENCOUNTER — Encounter (HOSPITAL_COMMUNITY): Payer: Self-pay | Admitting: Psychiatry

## 2021-12-28 ENCOUNTER — Telehealth (HOSPITAL_BASED_OUTPATIENT_CLINIC_OR_DEPARTMENT_OTHER): Payer: No Typology Code available for payment source | Admitting: Psychiatry

## 2021-12-28 ENCOUNTER — Telehealth (HOSPITAL_COMMUNITY): Payer: Self-pay | Admitting: Psychiatry

## 2021-12-28 VITALS — Wt 170.0 lb

## 2021-12-28 DIAGNOSIS — F4310 Post-traumatic stress disorder, unspecified: Secondary | ICD-10-CM

## 2021-12-28 DIAGNOSIS — F1210 Cannabis abuse, uncomplicated: Secondary | ICD-10-CM

## 2021-12-28 DIAGNOSIS — F319 Bipolar disorder, unspecified: Secondary | ICD-10-CM
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
## 2022-01-11 ENCOUNTER — Other Ambulatory Visit (HOSPITAL_COMMUNITY): Payer: Self-pay

## 2022-01-12 ENCOUNTER — Other Ambulatory Visit (HOSPITAL_COMMUNITY): Payer: Self-pay

## 2022-01-17 ENCOUNTER — Encounter: Payer: Self-pay | Admitting: Critical Care Medicine

## 2022-01-17 ENCOUNTER — Other Ambulatory Visit (HOSPITAL_COMMUNITY): Payer: Self-pay

## 2022-01-17 MED ORDER — OXYCODONE-ACETAMINOPHEN 10-325 MG PO TABS
1.0000 | ORAL_TABLET | Freq: Three times a day (TID) | ORAL | 0 refills | Status: DC | PRN
  Filled 2022-01-17: qty 30, 10d supply, fill #0

## 2022-01-20 ENCOUNTER — Encounter: Payer: Self-pay | Admitting: Critical Care Medicine

## 2022-01-30 ENCOUNTER — Emergency Department (HOSPITAL_COMMUNITY)
Admission: EM | Admit: 2022-01-30 | Discharge: 2022-01-31 | Discharge status: Against Medical Advice | Payer: No Typology Code available for payment source | Attending: Emergency Medicine | Admitting: Emergency Medicine

## 2022-01-30 ENCOUNTER — Encounter (HOSPITAL_COMMUNITY): Payer: Self-pay | Admitting: Emergency Medicine

## 2022-01-30 DIAGNOSIS — M542 Cervicalgia: Secondary | ICD-10-CM | POA: Insufficient documentation

## 2022-01-30 DIAGNOSIS — Z5321 Procedure and treatment not carried out due to patient leaving prior to being seen by health care provider: Secondary | ICD-10-CM | POA: Insufficient documentation

## 2022-01-30 NOTE — ED Triage Notes (Signed)
Pt c/o left sided neck pain with swelling for the past 1.5 weeks. Pt states she normally takes pain medication at home, but her PCP has been cutting her dose down. Pt admits to having prescription of perocets filled on 7/11 but has already ran out.   Hx of similar situation last summer which turned out to be a large tumor.

## 2022-01-31 ENCOUNTER — Encounter (HOSPITAL_COMMUNITY): Payer: Self-pay

## 2022-01-31 ENCOUNTER — Other Ambulatory Visit: Payer: Self-pay

## 2022-01-31 ENCOUNTER — Emergency Department (HOSPITAL_COMMUNITY): Payer: Self-pay

## 2022-01-31 ENCOUNTER — Other Ambulatory Visit (HOSPITAL_COMMUNITY): Payer: Self-pay

## 2022-01-31 ENCOUNTER — Emergency Department (HOSPITAL_COMMUNITY)
Admission: EM | Admit: 2022-01-31 | Discharge: 2022-01-31 | Disposition: A | Discharge status: Home / Self Care | Payer: Self-pay | Attending: Emergency Medicine | Admitting: Emergency Medicine

## 2022-01-31 DIAGNOSIS — M25512 Pain in left shoulder: Secondary | ICD-10-CM | POA: Insufficient documentation

## 2022-01-31 DIAGNOSIS — M542 Cervicalgia: Secondary | ICD-10-CM | POA: Insufficient documentation

## 2022-01-31 DIAGNOSIS — Z8541 Personal history of malignant neoplasm of cervix uteri: Secondary | ICD-10-CM | POA: Insufficient documentation

## 2022-01-31 DIAGNOSIS — F17210 Nicotine dependence, cigarettes, uncomplicated: Secondary | ICD-10-CM | POA: Insufficient documentation

## 2022-01-31 LAB — CBC WITH DIFFERENTIAL/PLATELET
Abs Immature Granulocytes: 0.01 10*3/uL (ref 0.00–0.07)
Basophils Absolute: 0.1 10*3/uL (ref 0.0–0.1)
Basophils Relative: 1 %
Eosinophils Absolute: 0.4 10*3/uL (ref 0.0–0.5)
Eosinophils Relative: 5 %
HCT: 37.2 % (ref 36.0–46.0)
Hemoglobin: 12.5 g/dL (ref 12.0–15.0)
Immature Granulocytes: 0 %
Lymphocytes Relative: 50 %
Lymphs Abs: 3.4 10*3/uL (ref 0.7–4.0)
MCH: 31.7 pg (ref 26.0–34.0)
MCHC: 33.6 g/dL (ref 30.0–36.0)
MCV: 94.4 fL (ref 80.0–100.0)
Monocytes Absolute: 0.5 10*3/uL (ref 0.1–1.0)
Monocytes Relative: 8 %
Neutro Abs: 2.5 10*3/uL (ref 1.7–7.7)
Neutrophils Relative %: 36 %
Platelets: 339 10*3/uL (ref 150–400)
RBC: 3.94 MIL/uL (ref 3.87–5.11)
RDW: 12.8 % (ref 11.5–15.5)
WBC: 6.8 10*3/uL (ref 4.0–10.5)
nRBC: 0 % (ref 0.0–0.2)

## 2022-01-31 LAB — BASIC METABOLIC PANEL
Anion gap: 6 (ref 5–15)
BUN: 20 mg/dL (ref 6–20)
CO2: 23 mmol/L (ref 22–32)
Calcium: 8.4 mg/dL — ABNORMAL LOW (ref 8.9–10.3)
Chloride: 109 mmol/L (ref 98–111)
Creatinine, Ser: 1.25 mg/dL — ABNORMAL HIGH (ref 0.44–1.00)
GFR, Estimated: 51 mL/min — ABNORMAL LOW (ref >60)
Glucose, Bld: 128 mg/dL — ABNORMAL HIGH (ref 70–99)
Potassium: 3.5 mmol/L (ref 3.5–5.1)
Sodium: 138 mmol/L (ref 135–145)

## 2022-01-31 MED ORDER — SODIUM CHLORIDE 0.9 % IV BOLUS
1000.0000 mL | Freq: Once | INTRAVENOUS | Status: AC
  Administered 2022-01-31: 1000 mL via INTRAVENOUS

## 2022-01-31 MED ORDER — IOHEXOL 300 MG/ML  SOLN
75.0000 mL | Freq: Once | INTRAMUSCULAR | Status: AC | PRN
  Administered 2022-01-31: 75 mL via INTRAVENOUS

## 2022-01-31 MED ORDER — SODIUM CHLORIDE (PF) 0.9 % IJ SOLN
INTRAMUSCULAR | Status: AC
  Filled 2022-01-31: qty 50

## 2022-01-31 MED ORDER — NAPROXEN 500 MG PO TABS
500.0000 mg | ORAL_TABLET | Freq: Two times a day (BID) | ORAL | 0 refills | Status: DC
  Filled 2022-01-31: qty 20, 10d supply, fill #0

## 2022-01-31 NOTE — Discharge Instructions (Signed)
The CT scan of your neck and chest is reassuring.  There is no evidence of any infiltrative disease like last time and the lymph nodes look normal.  It is unclear why you are having the pain. We recommend that you follow-up with your primary care doctor in 1 week.  Until then take Naprosyn that is prescribed.

## 2022-01-31 NOTE — ED Triage Notes (Signed)
Pt reports with left neck and left shoulder pain x 1.5 weeks. Pt states that she had a tumor a year ago in the same place.

## 2022-01-31 NOTE — ED Notes (Signed)
Pt refused labs at this time. Pt educated on purpose for labs. Pt feels that labs will not show a cause for the pain she is exhibiting.

## 2022-01-31 NOTE — ED Provider Notes (Signed)
[facility] DEPT Provider Note   CSN: [id number] Arrival date & time: 01/31/22  0631     History  Chief Complaint  Patient presents with   Neck Pain   Shoulder Pain    Katie Sherman is a 56 y.o. female.  HPI     56 year old female comes in with chief complaint of neck pain. Patient has remote history of cervical cancer and tobacco use disorder.  She comes in with left-sided neck pain for the last week and a half.  Patient states that last July she had a scare, with similar symptoms.  At that time they found concerning tumor over her CT chest.  She received biopsy which was inconclusive.  Subsequently she was supposed to see a CT surgeon, but the MRI that was completed preop was reassuring.  Subsequently patient has been monitored without any return of symptoms, last CT scan was in March.  However, for about the last week and a half she is having pain over the left neck and around her left collarbone.  Review of system is negative for any unexplained weight loss, night sweats, abnml lumps over her breast.  Home Medications Prior to Admission medications   Medication Sig Start Date End Date Taking? Authorizing Provider  albuterol (VENTOLIN HFA) 108 (90 Base) MCG/ACT inhaler Inhale 2 puffs into the lungs every 6 (six) hours as needed for wheezing or shortness of breath. 11/15/21  Yes Elsie Stain, MD  cyclobenzaprine (FLEXERIL) 10 MG tablet Take 1 tablet (10 mg total) by mouth 3 (three) times daily as needed for muscle spasms. 12/16/21  Yes Elsie Stain, MD  ibuprofen (ADVIL) 600 MG tablet Take 1 tablet (600 mg total) by mouth every 8 (eight) hours as needed. Patient taking differently: Take 600-1,800 mg by mouth 2 (two) times daily as needed for mild pain. 12/16/21  Yes Elsie Stain, MD  lamoTRIgine (LAMICTAL) 100 MG tablet Take 3 tablets (300 mg total) by mouth daily. 11/07/21  Yes Elsie Stain, MD  naproxen (NAPROSYN) 500 MG tablet  Take 1 tablet (500 mg total) by mouth 2 (two) times daily. 01/31/22  Yes Varney Biles, MD  nitroGLYCERIN (NITRODUR - DOSED IN MG/24 HR) 0.2 mg/hr patch Apply 1/4 patch daily to the affected area Patient taking differently: Place 0.25 patches onto the skin daily. 09/05/21  Yes Elsie Stain, MD  omeprazole (PRILOSEC) 20 MG capsule Take 1 capsule (20 mg total) by mouth daily. 08/18/21  Yes Elsie Stain, MD  oxyCODONE-acetaminophen (PERCOCET) 10-325 MG tablet Take 1 tablet by mouth every 8 (eight) hours as needed for pain. 01/17/22  Yes Elsie Stain, MD  pravastatin (PRAVACHOL) 40 MG tablet Take 1 tablet (40 mg total) by mouth daily. 09/05/21  Yes Elsie Stain, MD  valACYclovir (VALTREX) 500 MG tablet Take 1 tablet (500 mg total) by mouth daily. 11/15/21  Yes Elsie Stain, MD  Vitamin D, Ergocalciferol, (DRISDOL) 1.25 MG (50000 UNIT) CAPS capsule Take 1 capsule (50,000 Units total) by mouth every 7 (seven) days. Patient taking differently: Take 50,000 Units by mouth every Monday. 10/22/21  Yes Elsie Stain, MD  doxycycline (VIBRA-TABS) 100 MG tablet Take 1 tablet (100 mg total) by mouth 2 (two) times daily. Patient not taking: Reported on 01/31/2022 11/19/21   Elsie Stain, MD  QUEtiapine (SEROQUEL) 200 MG tablet Take 1 tablet (200 mg total) by mouth at bedtime. Patient not taking: Reported on 01/31/2022 11/07/21   Elsie Stain,  MD  traZODone (DESYREL) 50 MG tablet Take 1 tablet (50 mg total) by mouth at bedtime as needed for sleep. Patient not taking: Reported on 01/31/2022 12/16/21   Elsie Stain, MD      Allergies    Patient has no known allergies.    Review of Systems   Review of Systems  Physical Exam Updated Vital Signs BP 134/73 (BP Location: Right Arm)   Pulse 79   Temp 97.9 F (36.6 C) (Oral)   Resp 14   LMP 04/29/2012   SpO2 99%  Physical Exam Vitals and nursing note reviewed.  Constitutional:      Appearance: She is well-developed.  HENT:      Head: Atraumatic.  Cardiovascular:     Rate and Rhythm: Normal rate.  Pulmonary:     Effort: Pulmonary effort is normal.  Musculoskeletal:     Cervical back: Normal range of motion and neck supple.     Comments: Neck appears mostly symmetric.  She does have prominent supraclavicular and infraclavicular lymphadenopathy with tenderness to palpation  Skin:    General: Skin is warm and dry.  Neurological:     Mental Status: She is alert and oriented to person, place, and time.     ED Results / Procedures / Treatments   Labs (all labs ordered are listed, but only abnormal results are displayed) Labs Reviewed  BASIC METABOLIC PANEL - Abnormal; Notable for the following components:      Result Value   Glucose, Bld 128 (*)    Creatinine, Ser 1.25 (*)    Calcium 8.4 (*)    GFR, Estimated 51 (*)    All other components within normal limits  CBC WITH DIFFERENTIAL/PLATELET    EKG None   Radiology CT Soft Tissue Neck W Contrast  Result Date: 01/31/2022 CLINICAL DATA:  56 year old female with history of a infiltrative process at the left anterior 1st rib, superior mediastinum 1 year ago in July 2022. Biopsied x 2 with pathology NOT revealing malignancy, and subsequent resolution on CT and MRI suggesting an infectious or inflammatory process. Recurrent left neck and shoulder pain for 1.5 weeks. EXAM: CT NECK WITH CONTRAST TECHNIQUE: Multidetector CT imaging of the neck was performed using the standard protocol following the bolus administration of intravenous contrast. RADIATION DOSE REDUCTION: This exam was performed according to the departmental dose-optimization program which includes automated exposure control, adjustment of the mA and/or kV according to patient size and/or use of iterative reconstruction technique. CONTRAST:  85m OMNIPAQUE IOHEXOL 300 MG/ML  SOLN COMPARISON:  Neck CT 09/15/2021, 01/18/2021. Chest CT today reported separately. FINDINGS: Pharynx and larynx: Oropharynx motion  artifact today. Pharyngeal and laryngeal soft tissue contours remain stable and within normal limits. Negative visible parapharyngeal and retropharyngeal spaces. Salivary glands: Negative. Motion artifact at the sublingual space which seems to remain negative. Thyroid: Negative. Lymph nodes: Left level 4 lymph nodes at the left thoracic inlet are small and within normal limits on series 4, image 75, stable since March. There is a larger 7-8 mm node at the junction of left level 3 and 4 on series 4, image 60, but also stable since March, smaller from last year, and within normal limits by size criteria. Other bilateral level 2 and level 3 nodes also are within normal limits and smaller since July of last year. No cystic or necrotic nodes. Vascular: Major vascular structures in the neck and at the skull base including both internal jugular veins are patent. Left greater than right  cervical carotid atherosclerosis. Limited intracranial: Mild Calcified atherosclerosis at the skull base. Negative visible brain parenchyma. Visualized orbits: Negative. Mastoids and visualized paranasal sinuses: Visualized paranasal sinuses and mastoids are stable and well aerated. Skeleton: Motion artifact, especially the mandible. Chronic C5-C6 disc and endplate degeneration. No acute or suspicious osseous lesion identified in the neck. Upper chest: Chest CT today is reported separately. IMPRESSION: 1. No acute or inflammatory process identified in the neck. Stable since March and satisfactory CT appearance of the Neck, with resolved lymphadenopathy and left thoracic inlet inflammation since 2022. 2. Chest CT today reported separately. Electronically Signed   By: Genevie Ann M.D.   On: 01/31/2022 10:04   CT Chest W Contrast  Result Date: 01/31/2022 CLINICAL DATA:  Left neck and shoulder pain for 1.5 weeks. History of tumor. EXAM: CT CHEST WITH CONTRAST TECHNIQUE: Multidetector CT imaging of the chest was performed during intravenous  contrast administration. RADIATION DOSE REDUCTION: This exam was performed according to the departmental dose-optimization program which includes automated exposure control, adjustment of the mA and/or kV according to patient size and/or use of iterative reconstruction technique. CONTRAST:  71m OMNIPAQUE IOHEXOL 300 MG/ML  SOLN COMPARISON:  CT chest 09/16/2018 FINDINGS: Cardiovascular: Heart size is normal. There is no pericardial effusion. Mild calcifications of the left anterior descending artery are noted. The remainder of the thoracic vasculature is unremarkable. Mediastinum/Nodes: The thyroid is unremarkable. The esophagus is grossly unremarkable. There is no mediastinal, hilar, or axillary lymphadenopathy. Lungs/Pleura: The trachea and central airways are patent. The lungs clear. There is no focal consolidation or pulmonary edema. There is no pleural effusion or pneumothorax. There is upper lobe predominant emphysema, stable. There is a 5 mm nodule in the posteromedial left lower lobe (4-102), unchanged since the study from 08/19/2020 and 01/19/2020. No specific imaging follow-up is required for this nodule. There are no new or enlarging nodules. Upper Abdomen: The imaged portions of the upper abdominal viscera are unremarkable. Musculoskeletal: There is no acute osseous abnormality or suspicious osseous lesion. IMPRESSION: 1. No acute process in the chest or significant change since 09/16/2018. No lymphadenopathy identified. 2. Emphysema (ICD10-J43.9). Electronically Signed   By: PValetta MoleM.D.   On: 01/31/2022 10:01    Procedures Procedures    Medications Ordered in ED Medications  sodium chloride 0.9 % bolus 1,000 mL (0 mLs Intravenous Stopped 01/31/22 0922)  iohexol (OMNIPAQUE) 300 MG/ML solution 75 mL (75 mLs Intravenous Contrast Given 01/31/22 0935)  sodium chloride (PF) 0.9 % injection (  Given by Other 01/31/22 04540    ED Course/ Medical Decision Making/ A&P                            Medical Decision Making Amount and/or Complexity of Data Reviewed Labs: ordered. Radiology: ordered.  Risk Prescription drug management.   This patient presents to the ED with chief complaint(s) of left-sided neck pain and left clavicular pain with pertinent past medical history of previous history of similar symptoms with abnormal CT findings, that over time resolved on their own which further complicates the presenting complaint. The complaint involves an extensive differential diagnosis and also carries with it a high risk of complications and morbidity.    The differential diagnosis includes : Head and neck tumor, lymphoma, reactive lymphadenitis, venous thrombosis.  The initial plan is to get basic labs and a CT scan of her soft tissue neck with CT chest with contrast.  Additional history obtained: Records  reviewed last primary care visit along with CT findings from July, subsequent MRI and subsequent repeat CT scan.  Also reviewed the note from CT surgery.  Independent labs interpretation:  The following labs were independently interpreted: normal CBC and CMP.  Treatment and Reassessment: Patient CT scan is completed.  Results are reassuring.  There is no evidence of any infiltrative process or tumors.  No evidence of pneumonia, pathologic fracture and no evidence of reactive lymphadenopathy.  Unclear what the cause of her discomfort is.  We will recommend that she follows up with her PCP for further evaluation.  Final Clinical Impression(s) / ED Diagnoses Final diagnoses:  Neck pain  Arthralgia of left acromioclavicular joint    Rx / DC Orders ED Discharge Orders          Ordered    naproxen (NAPROSYN) 500 MG tablet  2 times daily        01/31/22 1020              Varney Biles, MD 01/31/22 1023

## 2022-01-31 NOTE — ED Notes (Signed)
Pt states she is leaving. Pt removed her IV. Pt states she is hungry and thirsty and does not have time to sit here. MD aware.

## 2022-02-01 NOTE — Telephone Encounter (Signed)
Error

## 2022-02-07 ENCOUNTER — Encounter: Payer: Self-pay | Admitting: Critical Care Medicine

## 2022-02-07 ENCOUNTER — Ambulatory Visit
Payer: No Typology Code available for payment source | Attending: Critical Care Medicine | Admitting: Critical Care Medicine

## 2022-02-07 ENCOUNTER — Other Ambulatory Visit (HOSPITAL_COMMUNITY): Payer: Self-pay

## 2022-02-07 ENCOUNTER — Telehealth: Payer: Self-pay

## 2022-02-07 VITALS — BP 114/78 | HR 86 | Ht 66.5 in | Wt 183.0 lb

## 2022-02-07 DIAGNOSIS — E782 Mixed hyperlipidemia: Secondary | ICD-10-CM | POA: Insufficient documentation

## 2022-02-07 DIAGNOSIS — F4310 Post-traumatic stress disorder, unspecified: Secondary | ICD-10-CM

## 2022-02-07 DIAGNOSIS — M25512 Pain in left shoulder: Secondary | ICD-10-CM | POA: Insufficient documentation

## 2022-02-07 DIAGNOSIS — U071 COVID-19: Secondary | ICD-10-CM

## 2022-02-07 DIAGNOSIS — Z5902 Unsheltered homelessness: Secondary | ICD-10-CM | POA: Insufficient documentation

## 2022-02-07 DIAGNOSIS — G894 Chronic pain syndrome: Secondary | ICD-10-CM

## 2022-02-07 DIAGNOSIS — Z79624 Long term (current) use of inhibitors of nucleotide synthesis: Secondary | ICD-10-CM | POA: Insufficient documentation

## 2022-02-07 DIAGNOSIS — E559 Vitamin D deficiency, unspecified: Secondary | ICD-10-CM

## 2022-02-07 DIAGNOSIS — I82C19 Acute embolism and thrombosis of unspecified internal jugular vein: Secondary | ICD-10-CM | POA: Insufficient documentation

## 2022-02-07 DIAGNOSIS — Z8541 Personal history of malignant neoplasm of cervix uteri: Secondary | ICD-10-CM | POA: Insufficient documentation

## 2022-02-07 DIAGNOSIS — M940 Chondrocostal junction syndrome [Tietze]: Secondary | ICD-10-CM

## 2022-02-07 DIAGNOSIS — Z803 Family history of malignant neoplasm of breast: Secondary | ICD-10-CM | POA: Insufficient documentation

## 2022-02-07 DIAGNOSIS — K219 Gastro-esophageal reflux disease without esophagitis: Secondary | ICD-10-CM

## 2022-02-07 DIAGNOSIS — R590 Localized enlarged lymph nodes: Secondary | ICD-10-CM | POA: Insufficient documentation

## 2022-02-07 DIAGNOSIS — K30 Functional dyspepsia: Secondary | ICD-10-CM | POA: Insufficient documentation

## 2022-02-07 DIAGNOSIS — R6884 Jaw pain: Secondary | ICD-10-CM | POA: Insufficient documentation

## 2022-02-07 DIAGNOSIS — Z5900 Homelessness unspecified: Secondary | ICD-10-CM | POA: Insufficient documentation

## 2022-02-07 DIAGNOSIS — F17210 Nicotine dependence, cigarettes, uncomplicated: Secondary | ICD-10-CM | POA: Insufficient documentation

## 2022-02-07 DIAGNOSIS — Z139 Encounter for screening, unspecified: Secondary | ICD-10-CM | POA: Diagnosis not present

## 2022-02-07 DIAGNOSIS — F319 Bipolar disorder, unspecified: Secondary | ICD-10-CM

## 2022-02-07 DIAGNOSIS — F17200 Nicotine dependence, unspecified, uncomplicated: Secondary | ICD-10-CM

## 2022-02-07 MED ORDER — TRAZODONE HCL 50 MG PO TABS
50.0000 mg | ORAL_TABLET | Freq: Every evening | ORAL | 2 refills | Status: DC | PRN
  Filled 2022-02-07: qty 30, 30d supply, fill #0

## 2022-02-07 MED ORDER — PRAVASTATIN SODIUM 40 MG PO TABS
40.0000 mg | ORAL_TABLET | Freq: Every day | ORAL | 3 refills | Status: DC
  Filled 2022-02-07: qty 30, 30d supply, fill #0
  Filled 2022-04-13: qty 30, 30d supply, fill #1
  Filled 2022-05-15: qty 30, 30d supply, fill #2
  Filled 2022-06-27: qty 30, 30d supply, fill #3

## 2022-02-07 MED ORDER — QUETIAPINE FUMARATE 200 MG PO TABS
200.0000 mg | ORAL_TABLET | Freq: Every day | ORAL | 2 refills | Status: DC
  Filled 2022-02-07: qty 30, 30d supply, fill #0

## 2022-02-07 MED ORDER — OMEPRAZOLE 20 MG PO CPDR
20.0000 mg | DELAYED_RELEASE_CAPSULE | Freq: Every day | ORAL | 1 refills | Status: DC
  Filled 2022-02-07: qty 30, 30d supply, fill #0
  Filled 2022-04-02: qty 30, 30d supply, fill #1
  Filled 2022-05-02: qty 30, 30d supply, fill #2
  Filled 2022-06-09: qty 30, 30d supply, fill #3

## 2022-02-07 MED ORDER — VITAMIN D (ERGOCALCIFEROL) 1.25 MG (50000 UNIT) PO CAPS
50000.0000 [IU] | ORAL_CAPSULE | ORAL | 5 refills | Status: DC
  Filled 2022-02-07: qty 4, 28d supply, fill #0
  Filled 2022-04-13 – 2022-05-02 (×2): qty 4, 28d supply, fill #1
  Filled 2022-06-27 – 2022-07-21 (×2): qty 4, 28d supply, fill #2
  Filled 2022-08-22: qty 4, 28d supply, fill #3
  Filled 2022-10-19: qty 4, 28d supply, fill #4
  Filled 2022-12-01: qty 4, 28d supply, fill #5
  Filled 2022-12-27: qty 4, 28d supply, fill #6
  Filled 2023-02-07: qty 2, 14d supply, fill #7

## 2022-02-07 MED ORDER — LAMOTRIGINE 100 MG PO TABS
300.0000 mg | ORAL_TABLET | Freq: Every day | ORAL | 2 refills | Status: DC
  Filled 2022-02-07: qty 90, 30d supply, fill #0
  Filled 2022-04-02: qty 90, 30d supply, fill #1

## 2022-02-07 MED ORDER — NALOXONE HCL 4 MG/0.1ML NA LIQD
NASAL | 0 refills | Status: DC
  Filled 2022-02-07: qty 2, 2d supply, fill #0

## 2022-02-07 MED ORDER — CYCLOBENZAPRINE HCL 10 MG PO TABS
10.0000 mg | ORAL_TABLET | Freq: Three times a day (TID) | ORAL | 2 refills | Status: DC | PRN
  Filled 2022-02-07: qty 30, 10d supply, fill #0
  Filled 2022-07-21: qty 30, 10d supply, fill #1

## 2022-02-07 MED ORDER — IBUPROFEN 600 MG PO TABS
600.0000 mg | ORAL_TABLET | Freq: Three times a day (TID) | ORAL | 2 refills | Status: DC | PRN
  Filled 2022-02-07 (×2): qty 90, 30d supply, fill #0

## 2022-02-07 MED ORDER — OXYCODONE-ACETAMINOPHEN 10-325 MG PO TABS
1.0000 | ORAL_TABLET | Freq: Three times a day (TID) | ORAL | 0 refills | Status: DC | PRN
  Filled 2022-02-07: qty 30, 10d supply, fill #0

## 2022-02-07 MED ORDER — VALACYCLOVIR HCL 500 MG PO TABS
500.0000 mg | ORAL_TABLET | Freq: Every day | ORAL | 1 refills | Status: DC
  Filled 2022-02-07: qty 30, 30d supply, fill #0
  Filled 2022-04-13: qty 30, 30d supply, fill #1
  Filled 2022-05-15: qty 30, 30d supply, fill #2
  Filled 2022-06-27: qty 30, 30d supply, fill #3

## 2022-02-07 MED ORDER — NITROGLYCERIN 0.2 MG/HR TD PT24
MEDICATED_PATCH | TRANSDERMAL | 1 refills | Status: AC
  Filled 2022-02-07: qty 7, 28d supply, fill #0
  Filled 2022-09-08: qty 7, 28d supply, fill #1
  Filled 2022-09-08: qty 30, 30d supply, fill #1

## 2022-02-07 MED ORDER — ALBUTEROL SULFATE HFA 108 (90 BASE) MCG/ACT IN AERS
2.0000 | INHALATION_SPRAY | Freq: Four times a day (QID) | RESPIRATORY_TRACT | 2 refills | Status: DC | PRN
  Filled 2022-02-07: qty 6.7, 25d supply, fill #0

## 2022-02-07 NOTE — Telephone Encounter (Signed)
Prior to calling, patient was seen in office and was supposed to a urine drug screen but refused due to smoking marijuana. Called patient to let her know that in order to refill her percocet she needed to do a urine drug screen and I asked her when can she do one and patient stated that she did not know when.   I will route to provider so that he is aware

## 2022-02-07 NOTE — Assessment & Plan Note (Signed)
Chronic pain syndrome from costochondritis and also associated acromioclavicular joint pain and left neck pain.  Imaging studies recently unrevealing  Patient will need to sign a pain control contract to receive further opiates.  I been reducing the dose very gradually.  Plan now is to refill a 30 count of Percocets 10/325.  Patient will alternate with extra strength Tylenol and ibuprofen prescription strength along with the Percocets to reduce to less than 2 Percocets daily.  Patient needs a urine drug screen she left before this could be obtained she needs to return for this for further prescriptions  Patient did sign the pain control contract and was given a prescription for Narcan as well.  Refill sent to [facility] outpatient pharmacy  It also like her seen by orthopedics and a referral will be sent once she can achieve the [facility] health discount she is to pick this up today

## 2022-02-07 NOTE — Patient Instructions (Signed)
We will have our case manager connected you with housing resources when she returns tomorrow from vacation  Another refill of the Percocets were sent to the [facility] outpatient pharmacy  Please begin the following protocol get Tylenol extra strength and take 2 of those 4 times a day this is over-the-counter  Take ibuprofen 600 mg 2-3 times a day on a regular basis this was refilled and sent to [facility] outpatient  Another 56 Percocets were given but you are to only take them if the pain is extreme and severe and breakthrough the Tylenol and ibuprofen the next refill will only be 20 and we will gradually dial this down to we stop the Percocet altogether  If you are unable to achieve that and it is possible you will not be able to achieve this simply because you are living out of your car and your orthopedic pain is severe we may consider continuing the Percocet but the goal is to get off the current Percocet  Pick up the [facility] discount application and fill it in and mail it with necessary paperwork accompanying it as soon as possible if you get this discount I will be able to send you to orthopedic surgery to have your neck shoulder evaluated  Screening labs were obtained at this visit  Refills on other medications were sent as well to the [facility] outpatient pharmacy under the patient found  A prescription for Narcan will be issued since she has been on chronic opiates to have with you  A urine drug screen will be obtained as well and you will be signing it patient pain control contract  Return to Dr. Joya Gaskins 2 months

## 2022-02-07 NOTE — Assessment & Plan Note (Signed)
As per costochondritis assessment

## 2022-02-07 NOTE — Assessment & Plan Note (Signed)
Patient does not engage around smoking cessation

## 2022-02-07 NOTE — Assessment & Plan Note (Signed)
Patient now is living in her car she is in a homeless state we will connect her with care management

## 2022-02-07 NOTE — Progress Notes (Signed)
Established Patient Office Visit  Subjective:  Patient ID: Katie Sherman, female    DOB: 08-15-1965  Age: 56 y.o. MRN: [id number]  CC: Chronic pain in the left side of the neck and clavicle  HPI  09/05/21 Katie Sherman presents for primary care to establish and follow-up of chronic condition of the chest and neck. Below are series of the previous documentations I have on this patient when I have seen her 1 time only in July 2022 for pulmonary consultation by her patient's primary care provider Dr. Wynetta Emery.  Subsequent to this the patient was cared for by pulmonary medicine Dr. Lamonte Sakai and thoracic surgery Dr. Kipp Brood and was seen 1 time by infectious disease Dr. Linus Salmons.  01/11/21 This a 56 year old female referred for hemoptysis difficulty swallowing and neck fullness, neck pain and supraclavicular fullness bilaterally of 4 weeks duration.  The patient's primary care provider is Dr. Wynetta Emery.  The patient's hemoptysis is pure blood with clots not mixed with mucus.  She had an episode of minimal hemoptysis a year ago last summer in 2021.  This went away she then developed COVID to beginning portion of 2022 recovered from this.  Patient began having neck swelling and pain 4 weeks ago and presented for evaluations both in the clinic in the emergency room.  Evaluations were not revealing.   At that time she was not having hemoptysis but over the past week she has had increased coughing up of blood combination with progressive swelling in the neck in the supraclavicular areas.  She is also having difficulty with swallowing as well.  Patient does smoke a pack a day of cigarettes and is under a lot of stress unable to reduce her tobacco intake.  She does have some dyspnea on exertion but no wheezing.  She is never been diagnosed with pneumonia.  She did have a CT of the chest in February of this year showing some patchy basilar groundglass changes.  She has also chronic subpleural nodules which have been  unchanged compared to previous exam in 2021 versus February 2022.  The patient's been out of work the last 2 weeks because of the difficulty she is now experiencing.   Patient's past medical history significant for cervical cancer in her self and family history of breast cancer.  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   Note ED visit 01/01/21 Katie Sherman is a 56 y.o. female with past medical history of tobacco use, cervical cancer.  Patient presents to the emergency department with a chief complaint of swelling and tenderness to her neck.  Patient reports that her symptoms started on Wednesday and were located on the left side of her neck.  Patient also endorsed that she was having some difficulty and pain when swallowing liquids but "only on the left side of my throat."  She reports that Thursday morning swelling and pain wear to both sides of her throat.  Reports progressive worsening of symptoms on Friday.  Patient reports that she tried to take your prescribed Acacian Friday morning but it "felt like my throat was closing up.".  States that it felt like one of her pills had become stuck in her throat.  Patient reports that she "stuck a wooden spoon down my throat," in an attempt to push the pill further  along.  Patient reports that she had an episode of vomiting after this.     At present patient reports that pain is only located to the right side of her neck.  Patient rates her pain 10/10 on the pain scale.  Patient reports that pain is worse with movement and touch.  Patient reports that she has had some relief with ibuprofen earlier in the week but has not taken any today.    08/26/21 phone note I attempted to call the patient to inform her about her MRI results. Note there was a  phone virtual visit with Dr Kipp Brood who spoke to the patient around 2pm today but due to high ED volume the MRI had yet to read at that point. I have received two very long MyChart messages from the patient this afternoon expressing her frustrations        I spoke to Dr Camie Patience, Chief of Radiology [facility] on the results  He and I reviewed all prior imaging to date.   The process appears to have started behind the sternoclavicular joint and first rib at the cartilaginous junction posteriorly.  It has significantly regressed and all the reactive mediastinal cervical and axillary adenopathy has disappeared   There is no cervical spine pathology.  The interna Jugular is patent .  As this was a combined MRI MRA study all great vessels to and from the heart are NORMAL   This does NOT represent Cancer    DR Lin Landsman had an interesting hypothesis, what if this were from some type of costochondritis that resulted in cartilaginous fracture and bleeding in the past of summer 2022.   The pt steadfastly denies trauma or IVDU .      In any case it is not cancer but likely inflammatory.   I have tried to call the patient multiple times.   She is living with a friend off [address].     I have left two phone msg's on the patients cell  there is no answer,  I have tried to call at least 8 times between 4p and now to no avail   I am copying this phone note with Drs Lamonte Sakai and Kipp Brood.  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  She is well aware it at about the current MRI as she had a visit with Dr. Kipp Brood over the phone he explained the current findings to her.  She was not available when I tried to call her 2 weeks ago.  She agrees today to become primary care under my coverage as well as follow-up here for her pulmonary status.  She states she is not having swallowing difficulty as she was before but has a  burning sensation on the back of her neck she has bad dentition.  She feels a squeezing sensation in the chest neck area and pain in the left clavicle.  She also notes a slight increase swelling in the right side of the neck as well.  She has left-sided shoulder pain as well.  She states she did fall early in the spring down her stairs and tried to grab with her left hand she does not recall specifically injuring her shoulder at that visit.  Patient subsequent developed acute onset of the symptoms in the neck in June 2022.  She then saw her primary care provider who then referred her to me.  See my July  2022 note.  At that point we ordered imaging of the neck with a CT scan.  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  And other findings of mass effect behind the left clavicle and also involvement of the tip of the first rib on the left and mediastinal adenopathy and mass effect under the chest wall.  Serial imagings over a period of time including PET scan and 2 other MRIs showed the mass lesion is reducing in size and the most recent MRI in February of this year showed marked reduction of all areas of involvement.  Patient denies any infectious disease history she denies any IV drug use.  No history of tuberculosis.  No fever chills or sweats.  Her weight is down somewhat.  She has not had any systemic blood draws.  Patient denies any further hemoptysis this only occurred in the summer 2022.  She is requesting a refill on a nitro drip patch which helped with bursitis of the right hip which she got from orthopedics in 2019.  She also wants her acyclovir refilled pill.  She would like refills on her chronic pain medicine.  She convened today with me today to see if any additional imaging studies can be performed and other evaluations be performed for this patient.  10/03/21   Patient seen in return visit and states she still having some posterior neck pain but the shoulder pain and  sternoclavicular pain has nearly resolved.  She has complained of a new problem and that she cracked a molar in her jaw and has been taking some of the oxycodone for the jaw pain.  She states the indomethacin has helped to some degree but is causing GI upset.  Patient is in need of a mammogram and colon cancer screening and agrees to receive these.  On arrival blood pressure is good 117/81.  The patient still smokes a pack a day of cigarettes.  She also needs a Pap smear.  She has upcoming in April and May Social Security determination assessments for disability with orthopedics and psychiatry.  8/1 This is a return visit for this 56 year old female with chronic pain in the left shoulder left clavicular area and left neck.  She went to the emergency room on 25 July with complaints of progressive pain.  CT imaging of the neck and chest were unremarkable for swelling or other abnormalities.  All previous abnormalities from 2020 to have now resolved.  Patient yet is still requesting more Percocets.  She needs to sign a chronic pain management pain control contract at this time since she is still requiring Percocets.  Patient now is completely homeless living in her car for the past 2 months this is exacerbated some of her chronic pain in her hip neck and back as she has to sleep in the car.  On arrival blood pressure 114/78.  Patient does admit to using cannabis. The patient does not need Pap smears she has had a complete cold knife conization and negative Pap smear in 2018 was told did not need further.  Patient has been seen by orthopedics in the past may yet need another referral.  Patient remains without any insurance at this time.  She is failed to achieve disability on several attempts. Past Medical History:  Diagnosis Date   Bilateral carpal tunnel syndrome 03/19/2018   Cervical cancer ([place])    Cervical cancer ([place])    Hemoptysis 01/11/2021   History of exercise stress test  ETT-Echo 5/18: normal  EF, no ischemia   History of loop recorder    currently on person   Kidney stone    Pharyngeal dysphagia 01/11/2021    Past Surgical History:  Procedure Laterality Date   CERVICAL CONE BIOPSY     TUBAL LIGATION      Family History  Problem Relation Age of Onset   Heart attack Mother 16   Heart disease Father    Heart attack Father 71   Liver cancer Father    Bone cancer Other    Breast cancer Maternal Aunt        Diagnosed in 68's   Bone cancer Maternal Aunt     Social History   Socioeconomic History   Marital status: Widowed    Spouse name: Not on file   Number of children: Not on file   Years of education: Not on file   Highest education level: Not on file  Occupational History   Occupation: unemployed  Tobacco Use   Smoking status: Every Day    Packs/day: 0.50    Years: 36.00    Total pack years: 18.00    Types: Cigarettes    Start date: 22   Smokeless tobacco: Never   Tobacco comments:    Smokes 1 pack a day ARJ 01/20/21  Vaping Use   Vaping Use: Never used  Substance and Sexual Activity   Alcohol use: No   Drug use: Yes    Types: Marijuana    Comment: stopped 04/06/2017   Sexual activity: Not Currently    Birth control/protection: None  Other Topics Concern   Not on file  Social History Narrative   Prior traffic control specialist - unemployed now   Widow   5 children, 10 grandchildren   Moved here from [place] 2013   Social Determinants of Health   Financial Resource Strain: Not on file  Food Insecurity: Not on file  Transportation Needs: Not on file  Physical Activity: Not on file  Stress: Not on file  Social Connections: Not on file  Intimate Partner Violence: Not on file    Outpatient Medications Prior to Visit  Medication Sig Dispense Refill   albuterol (VENTOLIN HFA) 108 (90 Base) MCG/ACT inhaler Inhale 2 puffs into the lungs every 6 (six) hours as needed for wheezing or shortness of breath. 8.5 g 2   cyclobenzaprine (FLEXERIL) 10  MG tablet Take 1 tablet (10 mg total) by mouth 3 (three) times daily as needed for muscle spasms. 30 tablet 2   ibuprofen (ADVIL) 600 MG tablet Take 1 tablet (600 mg total) by mouth every 8 (eight) hours as needed. (Patient taking differently: Take 600-1,800 mg by mouth 2 (two) times daily as needed for mild pain.) 30 tablet 2   lamoTRIgine (LAMICTAL) 100 MG tablet Take 3 tablets (300 mg total) by mouth daily. 90 tablet 2   nitroGLYCERIN (NITRODUR - DOSED IN MG/24 HR) 0.2 mg/hr patch Apply 1/4 patch daily to the affected area (Patient taking differently: Place 0.25 patches onto the skin daily.) 30 patch 1   omeprazole (PRILOSEC) 20 MG capsule Take 1 capsule (20 mg total) by mouth daily. 90 capsule 1   oxyCODONE-acetaminophen (PERCOCET) 10-325 MG tablet Take 1 tablet by mouth every 8 (eight) hours as needed for pain. 30 tablet 0   pravastatin (PRAVACHOL) 40 MG tablet Take 1 tablet (40 mg total) by mouth daily. 30 tablet 3   QUEtiapine (SEROQUEL) 200 MG tablet Take 1 tablet (200 mg total)  by mouth at bedtime. 30 tablet 2   traZODone (DESYREL) 50 MG tablet Take 1 tablet (50 mg total) by mouth at bedtime as needed for sleep. 30 tablet 2   valACYclovir (VALTREX) 500 MG tablet Take 1 tablet (500 mg total) by mouth daily. 60 tablet 1   Vitamin D, Ergocalciferol, (DRISDOL) 1.25 MG (50000 UNIT) CAPS capsule Take 1 capsule (50,000 Units total) by mouth every 7 (seven) days. (Patient taking differently: Take 50,000 Units by mouth every Monday.) 5 capsule 5   doxycycline (VIBRA-TABS) 100 MG tablet Take 1 tablet (100 mg total) by mouth 2 (two) times daily. (Patient not taking: Reported on 01/31/2022) 28 tablet 0   naproxen (NAPROSYN) 500 MG tablet Take 1 tablet (500 mg total) by mouth 2 (two) times daily. 20 tablet 0   No facility-administered medications prior to visit.    No Known Allergies  ROS Review of Systems  Constitutional:  Positive for fatigue. Negative for chills, diaphoresis, fever and unexpected  weight change.  HENT:  Negative for dental problem, ear pain, postnasal drip, rhinorrhea, sinus pressure, sore throat, trouble swallowing and voice change.   Eyes: Negative.   Respiratory: Negative.  Negative for apnea, cough, choking, chest tightness, shortness of breath, wheezing and stridor.        No further hemoptysis  Cardiovascular:  Negative for chest pain, palpitations and leg swelling.  Gastrointestinal: Negative.  Negative for abdominal distention, abdominal pain, nausea and vomiting.  Genitourinary: Negative.   Musculoskeletal:  Positive for neck pain and neck stiffness. Negative for arthralgias and myalgias.       Left acromioclavicular joint pain  Skin: Negative.  Negative for rash.  Allergic/Immunologic: Negative.  Negative for environmental allergies and food allergies.  Neurological: Negative.  Negative for dizziness, syncope, weakness and headaches.  Hematological: Negative.  Negative for adenopathy. Does not bruise/bleed easily.  Psychiatric/Behavioral:  Positive for dysphoric mood. Negative for agitation and sleep disturbance. The patient is not nervous/anxious.       Objective:    Physical Exam Vitals reviewed.  Constitutional:      Appearance: Normal appearance. She is well-developed. She is not diaphoretic.  HENT:     Head: Normocephalic and atraumatic.     Nose: Nose normal. No nasal deformity, septal deviation, mucosal edema or rhinorrhea.     Right Sinus: No maxillary sinus tenderness or frontal sinus tenderness.     Left Sinus: No maxillary sinus tenderness or frontal sinus tenderness.     Mouth/Throat:     Mouth: Mucous membranes are moist.     Pharynx: Oropharynx is clear. No oropharyngeal exudate.     Comments: Poor dentition cracked molars Eyes:     General: No scleral icterus.    Conjunctiva/sclera: Conjunctivae normal.     Pupils: Pupils are equal, round, and reactive to light.  Neck:     Thyroid: No thyromegaly.     Vascular: No carotid bruit  or JVD.     Trachea: Trachea normal. No tracheal tenderness or tracheal deviation.  Cardiovascular:     Rate and Rhythm: Normal rate and regular rhythm.     Chest Wall: PMI is not displaced.     Pulses: Normal pulses. No decreased pulses.     Heart sounds: Normal heart sounds, S1 normal and S2 normal. Heart sounds not distant. No murmur heard.    No systolic murmur is present.     No diastolic murmur is present.     No friction rub. No gallop. No S3 or  S4 sounds.  Pulmonary:     Effort: Pulmonary effort is normal. No tachypnea, accessory muscle usage or respiratory distress.     Breath sounds: Normal breath sounds. No stridor. No decreased breath sounds, wheezing, rhonchi or rales.     Comments: Chest wall tenderness has resolved Area circled improved Chest:     Chest wall: No mass, lacerations, deformity, swelling, tenderness, crepitus or edema. There is no dullness to percussion.  Breasts:    Right: Normal.     Left: Normal.    Abdominal:     General: Bowel sounds are normal. There is no distension.     Palpations: Abdomen is soft. Abdomen is not rigid.     Tenderness: There is no abdominal tenderness. There is no guarding or rebound.  Musculoskeletal:        General: Tenderness present. Normal range of motion.     Cervical back: Normal range of motion and neck supple. No edema, erythema or rigidity. No muscular tenderness. Normal range of motion.     Comments: Tender over the sternoclavicular joint and acromioclavicular joint and some tenderness in the neck but no lymphadenopathy is appreciated  Lymphadenopathy:     Head:     Right side of head: No submental or submandibular adenopathy.     Left side of head: No submental or submandibular adenopathy.     Cervical: No cervical adenopathy.     Upper Body:     Left upper body: No supraclavicular, axillary or pectoral adenopathy.  Skin:    General: Skin is warm and dry.     Coloration: Skin is not pale.     Findings: No rash.      Nails: There is no clubbing.  Neurological:     Mental Status: She is alert and oriented to person, place, and time.     Sensory: No sensory deficit.  Psychiatric:        Mood and Affect: Mood normal.        Speech: Speech normal.        Behavior: Behavior normal.     BP 114/78   Pulse 86   Ht 5' 6.5" (1.689 m)   Wt 183 lb (83 kg)   LMP 04/29/2012   SpO2 96%   BMI 29.09 kg/m  Wt Readings from Last 3 Encounters:  02/07/22 183 lb (83 kg)  01/30/22 169 lb 15.6 oz (77.1 kg)  10/03/21 161 lb (73 kg)     Health Maintenance Due  Topic Date Due   COLON CANCER SCREENING ANNUAL FOBT  Never done   INFLUENZA VACCINE  02/07/2022    There are no preventive care reminders to display for this patient.  Lab Results  Component Value Date   TSH 2.640 09/05/2021   Lab Results  Component Value Date   WBC 6.8 01/31/2022   HGB 12.5 01/31/2022   HCT 37.2 01/31/2022   MCV 94.4 01/31/2022   PLT 339 01/31/2022   Lab Results  Component Value Date   NA 138 01/31/2022   K 3.5 01/31/2022   CO2 23 01/31/2022   GLUCOSE 128 (H) 01/31/2022   BUN 20 01/31/2022   CREATININE 1.25 (H) 01/31/2022   BILITOT <0.2 09/05/2021   ALKPHOS 115 09/05/2021   AST 14 09/05/2021   ALT 10 09/05/2021   PROT 7.3 09/05/2021   ALBUMIN 4.4 09/05/2021   CALCIUM 8.4 (L) 01/31/2022   ANIONGAP 6 01/31/2022   EGFR 81 09/05/2021   Lab Results  Component Value Date  CHOL 203 (H) 09/05/2021   Lab Results  Component Value Date   HDL 56 09/05/2021   Lab Results  Component Value Date   LDLCALC 117 (H) 09/05/2021   Lab Results  Component Value Date   TRIG 170 (H) 09/05/2021   Lab Results  Component Value Date   CHOLHDL 3.6 09/05/2021   Lab Results  Component Value Date   HGBA1C 5.3 01/01/2020  CT Neck 01/19/21 EXAM: CT NECK WITH CONTRAST   TECHNIQUE: Multidetector CT imaging of the neck was performed using the standard protocol following the bolus administration of intravenous contrast.    CONTRAST:  164m OMNIPAQUE IOHEXOL 350 MG/ML SOLN   COMPARISON:  08/19/2020   FINDINGS: Pharynx and larynx: No evidence of primary mass or inflammation along the mucosa   Salivary glands: No inflammation, mass, or stone.   Thyroid: Normal.   Lymph nodes: Bilateral jugular chain adenopathy. A right level 4 node measures 19 mm maximum on axial images. There is an infiltrative process at the left thoracic inlet and upper mediastinum, with sheet like soft tissue thickening posterior to the jugular notch and manubrium. Just anterior to the left IJ brachiocephalic confluence is a cystic appearing component measuring 12 mm. Soft tissue density extends anterior to the left first costochondral junction. There is reticulation and adjacent left upper lobe with partially covered layering left pleural effusion.   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   Limited intracranial: Negative   Visualized orbits: Negative   Mastoids and visualized paranasal sinuses: Clear   Skeleton: No erosion of ribs or joint spaces adjacent to the inflammation, although the left first costochondral junction is wider than the right.   Upper chest: Reticular opacity in the subpleural left upper lobe adjacent to the chest wall process.   These results will be called to the ordering clinician or representative by the Radiologist Assistant, and communication documented in the PACS or [employer]   IMPRESSION: Infiltrative process centered on the left chest wall, especially at left first costochondral junction, with regional adenopathy , mediastinal fat infiltration, and apical pulmonary opacity. Atypical infection or malignancy are both possible.   MRI 05/2021 FINDINGS: Along the inferomedial margin of the left subclavius muscle for example on image 26 of series 24 and also shown on image 26 of series 18, we demonstrate a somewhat infiltrative 2.7 by  2.2 by 2.6 cm (volume = 8.1 cm^3) process. When I compare back to prior exams, the examinations of 01/18/2021 showed a substantially greater amount of abnormal enhancement in the retrosternal region, anterior mediastinum, and in the anterior left upper lobe adjacent to this lesion. By the time of the 02/03/2021 PET-CT, there was new bony destruction of the left anterior first rib but was not present on the earlier 01/27/2021 examinations. On today's exam there is substantial reduction in the overall previously enhancing soft tissue both in the subclavicular and subpectoral region and in the mediastinum, with anterior mediastinal and retrosternal involvement essentially resolved.   Moreover, the previous right level IV adenopathy in the lower neck shown on prior PET-CT has resolved. Please note that today's examination was not a diagnostic CT of the neck but does include the region of the prior enlarged right level IV lymph nodes. Prior AP window lymph node previously 1.1 cm in short axis diameter, currently 0.4 cm in short axis on image 46 series 17.   There is some accentuated enhancement medially in the left subclavian small cell  compared to right for example on image 25 of series 24 which may be related to the underlying process.   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   IMPRESSION: 1. Substantial improvement in the abnormal infiltrative process in the left medial infraclavicular region, along the inferior margin of the medial left subclavius muscle. On previous exams this process demonstrated substantial mediastinal, substernal, and anterior left upper lobe involvement on the CT scans from July, and on the  PET-CT had progressed to bony destructive findings in the anterior left first rib. On today's examination, the pulmonary, mediastinal, and substernal components have resolved, rib involvement is now questionable, and the effect of this process on the adjacent internal jugular and subclavian veins is substantially reduced. Moreover, the right level IV adenopathy and mediastinal adenopathy shown on the prior PET-CT has resolved. The remaining enhancing lesion measures up to 2.7 cm in long axis. The substantial improvement without known specific therapy would seem to suggest inflammatory or infectious process, but I do note that the patient did not have leukocytosis back in August. Possibility of an underlying mass or vascular malformation which previously bled causing inflammatory response without substantial leukocytosis is a possibility, although the aggressive findings previously present along the left anterior first rib with tended favor either tumor or infection rather than the sequela of a hematoma. Overall given the substantial improvement, surveillance might be a reasonable option although open biopsy to definitively sample and/or remove the underlying lesion with likewise be a very reasonable choice. MRI chest 08/25/21 FINDINGS: Bones/Joint/Cartilage:   The previously demonstrated process anteriorly in the upper left chest wall between the left clavicular head and anterior aspect of the left 1st rib shows continued improvement. There is mild residual asymmetric soft tissue enhancement in this area which extends around the left sternoclavicular joint. No progressive bone destruction identified. No evidence of soft tissue mass.   Ligaments:   No ligamentous abnormalities identified.   Muscles and Tendons:   The overlying pectoralis musculature appears normal.   Soft tissue:   As above, mild residual soft tissue enhancement in the area of the previously demonstrated  abnormality. No fluid collection or soft tissue mass identified. The left subclavian and left internal jugular veins appear patent.   IMPRESSION: 1. Further improvement in the previously demonstrated process involving the left anterior chest wall centered at the left 1st costochondral junction. The continued improvement favors a resolving inflammatory/infectious process. No mass lesion identified to suggest neoplasm. 2. This process was better demonstrated by CT, and if additional follow-is warranted clinically, consider neck CT with contrast.   CTs 01/2022 ED: CT Soft Tissue Neck W Contrast   Result Date: 01/31/2022 CLINICAL DATA:  56 year old female with history of a infiltrative process at the left anterior 1st rib, superior mediastinum 1 year ago in July 2022. Biopsied x 2 with pathology NOT revealing malignancy, and subsequent resolution on CT and MRI suggesting an infectious or inflammatory process. Recurrent left neck and shoulder pain for 1.5 weeks. EXAM: CT NECK WITH CONTRAST TECHNIQUE: Multidetector CT imaging of the neck was performed using the standard protocol following the bolus administration of intravenous contrast. RADIATION DOSE REDUCTION: This exam was performed according to the departmental dose-optimization program which includes automated exposure control, adjustment of the  mA and/or kV according to patient size and/or use of iterative reconstruction technique. CONTRAST:  96m OMNIPAQUE IOHEXOL 300 MG/ML  SOLN COMPARISON:  Neck CT 09/15/2021, 01/18/2021. Chest CT today reported separately. FINDINGS: Pharynx and larynx: Oropharynx motion artifact today. Pharyngeal and laryngeal soft tissue contours remain stable and within normal limits. Negative visible parapharyngeal and retropharyngeal spaces. Salivary glands: Negative. Motion artifact at the sublingual space which seems to remain negative. Thyroid: Negative. Lymph nodes: Left level 4 lymph nodes at the left thoracic inlet are  small and within normal limits on series 4, image 75, stable since March. There is a larger 7-8 mm node at the junction of left level 3 and 4 on series 4, image 60, but also stable since March, smaller from last year, and within normal limits by size criteria. Other bilateral level 2 and level 3 nodes also are within normal limits and smaller since July of last year. No cystic or necrotic nodes. Vascular: Major vascular structures in the neck and at the skull base including both internal jugular veins are patent. Left greater than right cervical carotid atherosclerosis. Limited intracranial: Mild Calcified atherosclerosis at the skull base. Negative visible brain parenchyma. Visualized orbits: Negative. Mastoids and visualized paranasal sinuses: Visualized paranasal sinuses and mastoids are stable and well aerated. Skeleton: Motion artifact, especially the mandible. Chronic C5-C6 disc and endplate degeneration. No acute or suspicious osseous lesion identified in the neck. Upper chest: Chest CT today is reported separately. IMPRESSION: 1. No acute or inflammatory process identified in the neck. Stable since March and satisfactory CT appearance of the Neck, with resolved lymphadenopathy and left thoracic inlet inflammation since 2022. 2. Chest CT today reported separately. Electronically Signed   By: HGenevie AnnM.D.   On: 01/31/2022 10:04    CT Chest W Contrast   Result Date: 01/31/2022 CLINICAL DATA:  Left neck and shoulder pain for 1.5 weeks. History of tumor. EXAM: CT CHEST WITH CONTRAST TECHNIQUE: Multidetector CT imaging of the chest was performed during intravenous contrast administration. RADIATION DOSE REDUCTION: This exam was performed according to the departmental dose-optimization program which includes automated exposure control, adjustment of the mA and/or kV according to patient size and/or use of iterative reconstruction technique. CONTRAST:  71mOMNIPAQUE IOHEXOL 300 MG/ML  SOLN COMPARISON:  CT chest  09/16/2018 FINDINGS: Cardiovascular: Heart size is normal. There is no pericardial effusion. Mild calcifications of the left anterior descending artery are noted. The remainder of the thoracic vasculature is unremarkable. Mediastinum/Nodes: The thyroid is unremarkable. The esophagus is grossly unremarkable. There is no mediastinal, hilar, or axillary lymphadenopathy. Lungs/Pleura: The trachea and central airways are patent. The lungs clear. There is no focal consolidation or pulmonary edema. There is no pleural effusion or pneumothorax. There is upper lobe predominant emphysema, stable. There is a 5 mm nodule in the posteromedial left lower lobe (4-102), unchanged since the study from 08/19/2020 and 01/19/2020. No specific imaging follow-up is required for this nodule. There are no new or enlarging nodules. Upper Abdomen: The imaged portions of the upper abdominal viscera are unremarkable. Musculoskeletal: There is no acute osseous abnormality or suspicious osseous lesion. IMPRESSION: 1. No acute process in the chest or significant change since 09/16/2018. No lymphadenopathy identified. 2. Emphysema (ICD10-J43.9). Electronically Signed   By: PeValetta Mole.D.   On: 01/31/2022 10:01        Assessment & Plan:   Problem List Items Addressed This Visit       Musculoskeletal and Integument   Costochondritis  Chronic pain syndrome from costochondritis and also associated acromioclavicular joint pain and left neck pain.  Imaging studies recently unrevealing  Patient will need to sign a pain control contract to receive further opiates.  I been reducing the dose very gradually.  Plan now is to refill a 30 count of Percocets 10/325.  Patient will alternate with extra strength Tylenol and ibuprofen prescription strength along with the Percocets to reduce to less than 2 Percocets daily.  Patient needs a urine drug screen she left before this could be obtained she needs to return for this for further  prescriptions  Patient did sign the pain control contract and was given a prescription for Narcan as well.  Refill sent to [facility] outpatient pharmacy  It also like her seen by orthopedics and a referral will be sent once she can achieve the  discount she is to pick this up today        Other   Bipolar 1 disorder, depressed (HCC)   Relevant Medications   lamoTRIgine (LAMICTAL) 100 MG tablet   QUEtiapine (SEROQUEL) 200 MG tablet   Vitamin D deficiency   Tobacco dependence    Patient does not engage around smoking cessation      Homelessness    Patient now is living in her car she is in a homeless state we will connect her with care management      Chronic pain syndrome - Primary    As per costochondritis assessment      Relevant Medications   cyclobenzaprine (FLEXERIL) 10 MG tablet   ibuprofen (ADVIL) 600 MG tablet   lamoTRIgine (LAMICTAL) 100 MG tablet   oxyCODONE-acetaminophen (PERCOCET) 10-325 MG tablet   traZODone (DESYREL) 50 MG tablet   Other Relevant Orders   [id number] 11+Oxyco+Alc+Crt-Bund   Other Visit Diagnoses     COVID-19 virus infection       Relevant Medications   albuterol (VENTOLIN HFA) 108 (90 Base) MCG/ACT inhaler   valACYclovir (VALTREX) 500 MG tablet   Gastroesophageal reflux disease without esophagitis       Relevant Medications   omeprazole (PRILOSEC) 20 MG capsule   PTSD (post-traumatic stress disorder)       Relevant Medications   QUEtiapine (SEROQUEL) 200 MG tablet   traZODone (DESYREL) 50 MG tablet   Encounter for health-related screening       Relevant Orders   Comprehensive metabolic panel   Lipid panel   Hemoglobin A1c      Meds ordered this encounter  Medications   cyclobenzaprine (FLEXERIL) 10 MG tablet    Sig: Take 1 tablet (10 mg total) by mouth 3 (three) times daily as needed for muscle spasms.    Dispense:  30 tablet    Refill:  2    [facility]   albuterol (VENTOLIN HFA) 108 (90 Base) MCG/ACT  inhaler    Sig: Inhale 2 puffs into the lungs every 6 (six) hours as needed for wheezing or shortness of breath.    Dispense:  6.7 g    Refill:  2    [facility]reg[facility] nurse   ibuprofen (ADVIL) 600 MG tablet    Sig: Take 1 tablet (600 mg total) by mouth every 8 (eight) hours as needed.    Dispense:  90 tablet    Refill:  2    [facility] nurse fund   lamoTRIgine (LAMICTAL) 100 MG tablet    Sig: Take 3 tablets (300 mg total) by mouth daily.    Dispense:  90 tablet  Refill:  2    [facility] nurse fund   nitroGLYCERIN (NITRODUR - DOSED IN MG/24 HR) 0.2 mg/hr patch    Sig: Apply 1/4 patch daily to the affected area    Dispense:  30 patch    Refill:  1    Cong nurse fund   omeprazole (PRILOSEC) 20 MG capsule    Sig: Take 1 capsule (20 mg total) by mouth daily.    Dispense:  60 capsule    Refill:  1    [facility] nurse fund   oxyCODONE-acetaminophen (PERCOCET) 10-325 MG tablet    Sig: Take 1 tablet by mouth every 8 (eight) hours as needed for pain.    Dispense:  30 tablet    Refill:  0    Chronic pain [employer]  pt is homeless   pravastatin (PRAVACHOL) 40 MG tablet    Sig: Take 1 tablet (40 mg total) by mouth daily.    Dispense:  30 tablet    Refill:  3    [facility] nurse fund   QUEtiapine (SEROQUEL) 200 MG tablet    Sig: Take 1 tablet (200 mg total) by mouth at bedtime.    Dispense:  30 tablet    Refill:  2    [facility] nurse fund   traZODone (DESYREL) 50 MG tablet    Sig: Take 1 tablet (50 mg total) by mouth at bedtime as needed for sleep.    Dispense:  30 tablet    Refill:  2    [facility]ational nurse fund   valACYclovir (VALTREX) 500 MG tablet    Sig: Take 1 tablet (500 mg total) by mouth daily.    Dispense:  60 tablet    Refill:  1    Congregatoinal nurse   Vitamin D, Ergocalciferol, (DRISDOL) 1.25 MG (50000 UNIT) CAPS capsule    Sig: Take 1 capsule (50,000 Units total) by mouth every 7 (seven) days.    Dispense:  5 capsule    Refill:   5    Patient assistance fund.  Pt is homeless and seen at [facility]   naloxone [facility]) nasal spray 4 mg/0.1 mL    Sig: Use in case of accidental overdose    Dispense:  2 each    Refill:  0  Pap smears not needed mammogram was negative 38 minutes spent on patient's exam Follow-up: Return in about 2 months (around 04/09/2022) for chronic pain, chronic conditions.    Asencion Noble, MD

## 2022-02-08 LAB — LIPID PANEL
Chol/HDL Ratio: 4 ratio (ref 0.0–4.4)
Cholesterol, Total: 207 mg/dL — ABNORMAL HIGH (ref 100–199)
HDL: 52 mg/dL (ref >39)
LDL Chol Calc (NIH): 119 mg/dL — ABNORMAL HIGH (ref 0–99)
Triglycerides: 206 mg/dL — ABNORMAL HIGH (ref 0–149)
VLDL Cholesterol Cal: 36 mg/dL (ref 5–40)

## 2022-02-08 LAB — COMPREHENSIVE METABOLIC PANEL
ALT: 10 IU/L (ref 0–32)
AST: 11 IU/L (ref 0–40)
Albumin/Globulin Ratio: 2 (ref 1.2–2.2)
Albumin: 4.5 g/dL (ref 3.8–4.9)
Alkaline Phosphatase: 129 IU/L — ABNORMAL HIGH (ref 44–121)
BUN/Creatinine Ratio: 9 (ref 9–23)
BUN: 10 mg/dL (ref 6–24)
Bilirubin Total: 0.2 mg/dL (ref 0.0–1.2)
CO2: 23 mmol/L (ref 20–29)
Calcium: 9.1 mg/dL (ref 8.7–10.2)
Chloride: 101 mmol/L (ref 96–106)
Creatinine, Ser: 1.08 mg/dL — ABNORMAL HIGH (ref 0.57–1.00)
Globulin, Total: 2.2 g/dL (ref 1.5–4.5)
Glucose: 107 mg/dL — ABNORMAL HIGH (ref 70–99)
Potassium: 4.3 mmol/L (ref 3.5–5.2)
Sodium: 139 mmol/L (ref 134–144)
Total Protein: 6.7 g/dL (ref 6.0–8.5)
eGFR: 61 mL/min/{1.73_m2} (ref >59)

## 2022-02-08 LAB — HEMOGLOBIN A1C
Est. average glucose Bld gHb Est-mCnc: 117 mg/dL
Hgb A1c MFr Bld: 5.7 % — ABNORMAL HIGH (ref 4.8–5.6)

## 2022-02-08 NOTE — Progress Notes (Signed)
Let pt know kidney liver normal,  stay on pravastatin cholesterol pill as cholesterol is high

## 2022-02-09 ENCOUNTER — Telehealth: Payer: Self-pay

## 2022-02-09 ENCOUNTER — Encounter: Payer: Self-pay | Admitting: Critical Care Medicine

## 2022-02-09 NOTE — Telephone Encounter (Signed)
Pt was called and vm was left, Information has been sent to nurse pool.   

## 2022-02-09 NOTE — Telephone Encounter (Signed)
-----   Message from Elsie Stain, MD sent at 02/08/2022  9:47 AM EDT ----- Let pt know kidney liver normal,  stay on pravastatin cholesterol pill as cholesterol is high

## 2022-02-10 ENCOUNTER — Encounter: Payer: Self-pay | Admitting: Critical Care Medicine

## 2022-02-13 ENCOUNTER — Encounter: Payer: Self-pay | Admitting: Critical Care Medicine

## 2022-02-22 ENCOUNTER — Encounter: Payer: Self-pay | Admitting: Critical Care Medicine

## 2022-02-22 DIAGNOSIS — M7061 Trochanteric bursitis, right hip: Secondary | ICD-10-CM

## 2022-02-22 DIAGNOSIS — M7502 Adhesive capsulitis of left shoulder: Secondary | ICD-10-CM

## 2022-02-22 DIAGNOSIS — G894 Chronic pain syndrome: Secondary | ICD-10-CM

## 2022-03-02 ENCOUNTER — Telehealth: Payer: Self-pay | Admitting: Critical Care Medicine

## 2022-03-02 ENCOUNTER — Telehealth: Payer: Self-pay | Admitting: Emergency Medicine

## 2022-03-02 DIAGNOSIS — H539 Unspecified visual disturbance: Secondary | ICD-10-CM

## 2022-03-02 NOTE — Telephone Encounter (Signed)
Copied from [place] [id number]. Topic: General - Other >> Mar 02, 2022  4:13 PM Katie Sherman wrote: Reason for CRM: The patient would like to be contacted by a member of clinical staff when possible  The patient shares that they have submitted mychart messages previously   The patient has stressed the urgency of their request for contact   Please contact further

## 2022-03-02 NOTE — Telephone Encounter (Signed)
Needs optometry that takes [facility]

## 2022-03-03 ENCOUNTER — Other Ambulatory Visit (HOSPITAL_COMMUNITY): Payer: Self-pay

## 2022-03-03 ENCOUNTER — Ambulatory Visit: Payer: No Typology Code available for payment source | Attending: Internal Medicine

## 2022-03-03 DIAGNOSIS — G894 Chronic pain syndrome: Secondary | ICD-10-CM | POA: Diagnosis not present

## 2022-03-03 MED ORDER — OXYCODONE-ACETAMINOPHEN 10-325 MG PO TABS
1.0000 | ORAL_TABLET | Freq: Three times a day (TID) | ORAL | 0 refills | Status: DC | PRN
  Filled 2022-03-03: qty 30, 10d supply, fill #0
  Filled 2022-03-03: qty 15, 5d supply, fill #0

## 2022-03-03 NOTE — Telephone Encounter (Signed)
Pt is coming in for urine drug screen fyi perhaps today

## 2022-03-03 NOTE — Telephone Encounter (Signed)
Referrals were made as well

## 2022-03-03 NOTE — Telephone Encounter (Signed)
Provider contacted patient through mychart  and patient also came in for urine drug screen today as well.

## 2022-03-04 ENCOUNTER — Encounter: Payer: Self-pay | Admitting: Critical Care Medicine

## 2022-03-05 LAB — DRUG SCREEN 764883 11+OXYCO+ALC+CRT-BUND
Amphetamines, Urine: NEGATIVE ng/mL
BENZODIAZ UR QL: NEGATIVE ng/mL
Barbiturate: NEGATIVE ng/mL
Cannabinoid Quant, Ur: NEGATIVE ng/mL
Cocaine (Metabolite): NEGATIVE ng/mL
Creatinine: 14.3 mg/dL — ABNORMAL LOW (ref 20.0–300.0)
Ethanol: NEGATIVE %
Meperidine: NEGATIVE ng/mL
Methadone Screen, Urine: NEGATIVE ng/mL
OPIATE SCREEN URINE: NEGATIVE ng/mL
Oxycodone/Oxymorphone, Urine: NEGATIVE ng/mL
Phencyclidine: NEGATIVE ng/mL
Propoxyphene: NEGATIVE ng/mL
Tramadol: NEGATIVE ng/mL
pH, Urine: 6.2 (ref 4.5–8.9)

## 2022-03-05 LAB — SPECIFIC GRAVITY: Specific Gravity: 1.003

## 2022-03-07 ENCOUNTER — Other Ambulatory Visit (HOSPITAL_COMMUNITY): Payer: Self-pay

## 2022-03-07 ENCOUNTER — Encounter: Payer: Self-pay | Admitting: Critical Care Medicine

## 2022-03-07 MED ORDER — OXYCODONE-ACETAMINOPHEN 10-325 MG PO TABS
1.0000 | ORAL_TABLET | Freq: Three times a day (TID) | ORAL | 0 refills | Status: DC | PRN
  Filled 2022-03-07: qty 15, 5d supply, fill #0

## 2022-03-07 NOTE — Telephone Encounter (Signed)
Luke need your help  this pt has chronic pain I am getting her to pain management but in the interim she now has medicaid and they think it is acute but is chronic  how do we get [facility] to release more than 3 day supply percocet??

## 2022-03-08 ENCOUNTER — Other Ambulatory Visit: Payer: Self-pay

## 2022-03-08 ENCOUNTER — Ambulatory Visit: Payer: Self-pay

## 2022-03-08 ENCOUNTER — Other Ambulatory Visit (HOSPITAL_COMMUNITY): Payer: Self-pay

## 2022-03-08 ENCOUNTER — Ambulatory Visit (INDEPENDENT_AMBULATORY_CARE_PROVIDER_SITE_OTHER): Payer: Medicaid Other | Admitting: Family Medicine

## 2022-03-08 VITALS — BP 154/85 | Ht 67.0 in | Wt 180.0 lb

## 2022-03-08 DIAGNOSIS — G5603 Carpal tunnel syndrome, bilateral upper limbs: Secondary | ICD-10-CM

## 2022-03-08 MED ORDER — METHYLPREDNISOLONE ACETATE 40 MG/ML IJ SUSP
40.0000 mg | Freq: Once | INTRAMUSCULAR | Status: AC
  Administered 2022-03-08: 40 mg via INTRA_ARTICULAR

## 2022-03-08 NOTE — Progress Notes (Unsigned)
Established Patient Office Visit  Subjective   Patient ID: Katie Sherman, female    DOB: [birth year]  Age: 56 y.o. MRN: [id number]  B/l carpal tunnel.  She presents today with chief complaint of bilateral carpal tunnel.  She states she has been experiencing numbness tingling in her third outside fingers in both of her hands with her left hand being more bothersome than her right.  She has had carpal tunnel in the past, 2019, that resolved with splinting and injections, states this feels very similarly. Her pain is worse in the mornings when she first wakes up.  She has not been wearing cock-up wrist splints as she does not know where they are at this time.  She does take ibuprofen and pain medication, Percocet for other conditions however she feels like this does not help her wrist pain either.  She has tried topicals, Voltaren with no relief of her symptoms.  She is beginning to drop things with her left hand secondary to the numbness and tingling.  Of note she is right-handed.  ROS as listed above in HPI    Objective:     BP (!) 154/85   Ht '5\' 7"'$  (1.702 m)   Wt 180 lb (81.6 kg)   LMP 04/29/2012   BMI 28.19 kg/m   Physical Exam Vitals reviewed.  Constitutional:      General: She is not in acute distress.    Appearance: Normal appearance. She is normal weight. She is not ill-appearing, toxic-appearing or diaphoretic.  Pulmonary:     Effort: Pulmonary effort is normal.  Neurological:     Mental Status: She is alert.   Right wrist: No obvious deformity or asymmetry.  No ecchymosis or effusion.  She does have multiple tattoos on upper extremities.  No tenderness to palpation of the wrist or hand.  Full range of motion flexion, extension ulnar and radial deviation.  Decreased sensation to light touch over palmar surface of first second and third phalanges.  Positive Tinel's at the wrist.  Positive Phalen's. Left wrist: No obvious deformity or asymmetry.  No ecchymosis or effusion no  tenderness to palpation of the wrist or hand.  Full range of motion flexion, extension, ulnar and radial deviation.  Decreased sensation to light touch over the palmar surface of the first second and third phalanges.  Slight atrophy of the muscles on the thenar eminence.  Positive Tinel's at the wrist.  Positive Phalen's.  Ultrasound of wrist-right  Median nerve-visualized, no obvious swelling or hypoechoic fluid surrounding the area, area measured to be approximately 0.10 cm  Summary and additional findings-median nerve was visualized, area of the nerve found to be within normal limits, not suggestive of inflammation of the nerve.  Ultrasound of wrist-left  Median nerve-visualized, no obvious swelling or hypoechoic fluid surrounding the area, area measured to be approximately 0.11 cm  Summary and additional findings-median nerve was visualized, area of the nerve found to be within normal limits, not suggestive of inflammation of the nerve.    Assessment & Plan:   Problem List Items Addressed This Visit       Nervous and Auditory   Bilateral carpal tunnel syndrome - Primary    Clinical signs of carpal tunnel syndrome bilaterally.  Ultrasound evidence was less diagnostic for carpal tunnel syndrome.  Patient's left median nerve was injected today with steroid injection to alleviate symptoms, procedure detailed below. She was also fitted and given bilateral cock-up splints to be worn at night and during the  day as often as possible.  She may continue with anti-inflammatory medications.  Patient to follow-up with him 1 week.  If she does get relief from injection we can consider repeating injection on the right side.  If injection does not alleviate her symptoms we may need to consider further testing such as EMG.      Relevant Orders   [place] LIMITED JOINT SPACE STRUCTURES UP BILAT  After informed written consent timeout was performed, patient was seated on exam table.  Area overlying left carpal  tunnel prepped with alcohol swab then utilizing ultrasound guidance, patient's left carpal tunnel injected with 1:1 lidocaine: depomedrol. Patient tolerated procedure well without immediate complications.   Return in about 1 week (around 03/15/2022).    Elmore Guise, DO

## 2022-03-08 NOTE — Assessment & Plan Note (Addendum)
Clinical signs of carpal tunnel syndrome bilaterally.  Ultrasound evidence was less diagnostic for carpal tunnel syndrome.  Patient's left median nerve was injected today with steroid injection to alleviate symptoms, procedure detailed below. She was also fitted and given bilateral cock-up splints to be worn at night and during the day as often as possible.  She may continue with anti-inflammatory medications.  Patient to follow-up with him 1 week.  If she does get relief from injection we can consider repeating injection on the right side.  If injection does not alleviate her symptoms we may need to consider further testing such as EMG.

## 2022-03-08 NOTE — Progress Notes (Deleted)
PCP: Elsie Stain, MD  Subjective:   HPI: Patient is a 56 y.o. female here for ***.  ***  Past Medical History:  Diagnosis Date   Bilateral carpal tunnel syndrome 03/19/2018   Cervical cancer ([place])    Cervical cancer ([place])    Hemoptysis 01/11/2021   History of exercise stress test    ETT-Echo 5/18: normal EF, no ischemia   History of loop recorder    currently on person   Kidney stone    Pharyngeal dysphagia 01/11/2021    Current Outpatient Medications on File Prior to Visit  Medication Sig Dispense Refill   albuterol (VENTOLIN HFA) 108 (90 Base) MCG/ACT inhaler Inhale 2 puffs into the lungs every 6 (six) hours as needed for wheezing or shortness of breath. 6.7 g 2   cyclobenzaprine (FLEXERIL) 10 MG tablet Take 1 tablet (10 mg total) by mouth 3 (three) times daily as needed for muscle spasms. 30 tablet 2   ibuprofen (ADVIL) 600 MG tablet Take 1 tablet (600 mg total) by mouth every 8 (eight) hours as needed. 90 tablet 2   lamoTRIgine (LAMICTAL) 100 MG tablet Take 3 tablets (300 mg total) by mouth daily. 90 tablet 2   naloxone (NARCAN) nasal spray 4 mg/0.1 mL Use in case of accidental overdose 2 each 0   nitroGLYCERIN (NITRODUR - DOSED IN MG/24 HR) 0.2 mg/hr patch Apply 1/4 patch daily to the affected area 30 patch 1   omeprazole (PRILOSEC) 20 MG capsule Take 1 capsule (20 mg total) by mouth daily. 60 capsule 1   oxyCODONE-acetaminophen (PERCOCET) 10-325 MG tablet Take 1 tablet by mouth every 8 (eight) hours as needed for pain. 30 tablet 0   pravastatin (PRAVACHOL) 40 MG tablet Take 1 tablet (40 mg total) by mouth daily. 30 tablet 3   QUEtiapine (SEROQUEL) 200 MG tablet Take 1 tablet (200 mg total) by mouth at bedtime. 30 tablet 2   traZODone (DESYREL) 50 MG tablet Take 1 tablet (50 mg total) by mouth at bedtime as needed for sleep. 30 tablet 2   valACYclovir (VALTREX) 500 MG tablet Take 1 tablet (500 mg total) by mouth daily. 60 tablet 1   Vitamin D, Ergocalciferol, (DRISDOL) 1.25  MG (50000 UNIT) CAPS capsule Take 1 capsule (50,000 Units total) by mouth every 7 (seven) days. 5 capsule 5   No current facility-administered medications on file prior to visit.    Past Surgical History:  Procedure Laterality Date   CERVICAL CONE BIOPSY     TUBAL LIGATION      No Known Allergies  Ht '5\' 7"'$  (1.702 m)   Wt 180 lb (81.6 kg)   LMP 04/29/2012   BMI 28.19 kg/m       No data to display              No data to display              Objective:  Physical Exam:  Gen: NAD, comfortable in exam room  ***   Assessment & Plan:  1. ***

## 2022-03-09 ENCOUNTER — Encounter: Payer: Self-pay | Admitting: Family Medicine

## 2022-03-09 ENCOUNTER — Encounter: Payer: Self-pay | Admitting: Critical Care Medicine

## 2022-03-09 DIAGNOSIS — G5603 Carpal tunnel syndrome, bilateral upper limbs: Secondary | ICD-10-CM

## 2022-03-09 DIAGNOSIS — M7061 Trochanteric bursitis, right hip: Secondary | ICD-10-CM

## 2022-03-09 DIAGNOSIS — M7502 Adhesive capsulitis of left shoulder: Secondary | ICD-10-CM

## 2022-03-09 DIAGNOSIS — G894 Chronic pain syndrome: Secondary | ICD-10-CM

## 2022-03-09 DIAGNOSIS — M940 Chondrocostal junction syndrome [Tietze]: Secondary | ICD-10-CM

## 2022-03-15 ENCOUNTER — Ambulatory Visit: Payer: Medicaid Other | Admitting: Family Medicine

## 2022-03-21 ENCOUNTER — Telehealth (HOSPITAL_COMMUNITY): Payer: Self-pay | Admitting: Psychiatry

## 2022-03-28 ENCOUNTER — Encounter: Payer: Self-pay | Admitting: Critical Care Medicine

## 2022-03-29 ENCOUNTER — Ambulatory Visit: Payer: No Typology Code available for payment source | Admitting: Sports Medicine

## 2022-03-29 ENCOUNTER — Other Ambulatory Visit (HOSPITAL_COMMUNITY): Payer: Self-pay

## 2022-03-29 MED ORDER — OXYCODONE-ACETAMINOPHEN 10-325 MG PO TABS
1.0000 | ORAL_TABLET | Freq: Three times a day (TID) | ORAL | 0 refills | Status: DC | PRN
  Filled 2022-03-29: qty 50, 17d supply, fill #0

## 2022-03-29 MED ORDER — IBUPROFEN 600 MG PO TABS
600.0000 mg | ORAL_TABLET | Freq: Three times a day (TID) | ORAL | 2 refills | Status: DC | PRN
  Filled 2022-03-29: qty 90, 30d supply, fill #0

## 2022-03-29 NOTE — Addendum Note (Signed)
Addended by: Asencion Noble E on: 03/29/2022 11:00 AM   Modules accepted: Orders

## 2022-04-03 ENCOUNTER — Other Ambulatory Visit (HOSPITAL_COMMUNITY): Payer: Self-pay

## 2022-04-05 ENCOUNTER — Ambulatory Visit: Payer: Medicaid Other | Admitting: Sports Medicine

## 2022-04-05 ENCOUNTER — Encounter (HOSPITAL_COMMUNITY): Payer: Self-pay | Admitting: Psychiatry

## 2022-04-05 ENCOUNTER — Telehealth (HOSPITAL_BASED_OUTPATIENT_CLINIC_OR_DEPARTMENT_OTHER): Payer: Medicaid Other | Admitting: Psychiatry

## 2022-04-05 ENCOUNTER — Telehealth (HOSPITAL_COMMUNITY): Payer: Self-pay | Admitting: *Deleted

## 2022-04-05 ENCOUNTER — Other Ambulatory Visit (HOSPITAL_COMMUNITY): Payer: Self-pay

## 2022-04-05 VITALS — Wt 180.0 lb

## 2022-04-05 DIAGNOSIS — F1210 Cannabis abuse, uncomplicated: Secondary | ICD-10-CM

## 2022-04-05 DIAGNOSIS — F319 Bipolar disorder, unspecified: Secondary | ICD-10-CM

## 2022-04-05 DIAGNOSIS — F4310 Post-traumatic stress disorder, unspecified: Secondary | ICD-10-CM

## 2022-04-05 MED ORDER — TRAZODONE HCL 50 MG PO TABS
50.0000 mg | ORAL_TABLET | Freq: Every evening | ORAL | 2 refills | Status: DC | PRN
  Filled 2022-04-05: qty 30, 30d supply, fill #0

## 2022-04-05 MED ORDER — QUETIAPINE FUMARATE 200 MG PO TABS
200.0000 mg | ORAL_TABLET | Freq: Every day | ORAL | 2 refills | Status: DC
  Filled 2022-04-05: qty 30, 30d supply, fill #0

## 2022-04-05 MED ORDER — LAMOTRIGINE 100 MG PO TABS
300.0000 mg | ORAL_TABLET | Freq: Every day | ORAL | 2 refills | Status: DC
  Filled 2022-04-05 – 2022-05-02 (×2): qty 90, 30d supply, fill #0
  Filled 2022-06-09: qty 90, 30d supply, fill #1

## 2022-04-05 NOTE — Telephone Encounter (Signed)
PA FOR SEROQUEL 200 MG QD HAS BEEN APPROVED BY [employer].  APPROVED THROUGH 04/06/23.
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
## 2022-04-05 NOTE — Telephone Encounter (Signed)
PA FOR SEROQUEL 200 MG TABLETS SUBMITTED TO [facility] VIA COVER MY MEDS.  AWAITING DETERMINATION.   KEY: [id number]

## 2022-04-09 NOTE — Progress Notes (Unsigned)
Established Patient Office Visit  Subjective:  Patient ID: Katie Sherman, female    DOB: [birth year]  Age: 56 y.o. MRN: [id number] Virtual Visit via Telephone Note  I connected with Katie Sherman on 04/11/22 at  1:30 PM EDT by telephone and verified that I am speaking with the correct person using two identifiers.   Consent:  I discussed the limitations, risks, security and privacy concerns of performing an evaluation and management service by telephone and the availability of in person appointments. I also discussed with the patient that there may be a patient responsible charge related to this service. The patient expressed understanding and agreed to proceed.  Location of patient: Patient's at home  Location of provider: I am in my office  Persons participating in the televisit with the patient.   No one else on the call    History of Present Illness:    CC: Chronic pain in the left side of the neck and clavicle  HPI  09/05/21 Katie Sherman presents for primary care to establish and follow-up of chronic condition of the chest and neck. Below are series of the previous documentations I have on this patient when I have seen her 1 time only in July 2022 for pulmonary consultation by her patient's primary care provider Dr. Wynetta Emery.  Subsequent to this the patient was cared for by pulmonary medicine Dr. Lamonte Sakai and thoracic surgery Dr. Kipp Brood and was seen 1 time by infectious disease Dr. Linus Salmons.  01/11/21 This a 56 year old female referred for hemoptysis difficulty swallowing and neck fullness, neck pain and supraclavicular fullness bilaterally of 4 weeks duration.  The patient's primary care provider is Dr. Wynetta Emery.  The patient's hemoptysis is pure blood with clots not mixed with mucus.  She had an episode of minimal hemoptysis a year ago last summer in 2021.  This went away she then developed COVID to beginning portion of 2022 recovered from this.  Patient began having neck  swelling and pain 4 weeks ago and presented for evaluations both in the clinic in the emergency room.  Evaluations were not revealing.   At that time she was not having hemoptysis but over the past week she has had increased coughing up of blood combination with progressive swelling in the neck in the supraclavicular areas.  She is also having difficulty with swallowing as well.  Patient does smoke a pack a day of cigarettes and is under a lot of stress unable to reduce her tobacco intake.  She does have some dyspnea on exertion but no wheezing.  She is never been diagnosed with pneumonia.  She did have a CT of the chest in February of this year showing some patchy basilar groundglass changes.  She has also chronic subpleural nodules which have been unchanged compared to previous exam in 2021 versus February 2022.  The patient's been out of work the last 2 weeks because of the difficulty she is now experiencing.   Patient's past medical history significant for cervical cancer in her self and family history of breast cancer.  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   Note ED visit 01/01/21 ROSSY Sherman is a 56 y.o. female with past medical history of tobacco use, cervical cancer.  Patient presents to the emergency department with a chief complaint of  swelling and tenderness to her neck.  Patient reports that her symptoms started on Wednesday and were located on the left side of her neck.  Patient also endorsed that she was having some difficulty and pain when swallowing liquids but "only on the left side of my throat."  She reports that Thursday morning swelling and pain wear to both sides of her throat.  Reports progressive worsening of symptoms on Friday.  Patient reports that she tried to take  your prescribed Acacian Friday morning but it "felt like my throat was closing up.".  States that it felt like one of her pills had become stuck in her throat.  Patient reports that she "stuck a wooden spoon down my throat," in an attempt to push the pill further along.  Patient reports that she had an episode of vomiting after this.     At present patient reports that pain is only located to the right side of her neck.  Patient rates her pain 10/10 on the pain scale.  Patient reports that pain is worse with movement and touch.  Patient reports that she has had some relief with ibuprofen earlier in the week but has not taken any today.    08/26/21 phone note I attempted to call the patient to inform her about her MRI results. Note there was a phone virtual visit with Dr Kipp Brood who spoke to the patient around 2pm today but due to high ED volume the MRI had yet to read at that point. I have received two very long MyChart messages from the patient this afternoon expressing her frustrations        I spoke to Dr Camie Patience, Chief of Radiology [facility] on the results  He and I reviewed all prior imaging to date.   The process appears to have started behind the sternoclavicular joint and first rib at the cartilaginous junction posteriorly.  It has significantly regressed and all the reactive mediastinal cervical and axillary adenopathy has disappeared   There is no cervical spine pathology.  The interna Jugular is patent .  As this was a combined MRI MRA study all great vessels to and from the heart are NORMAL   This does NOT represent Cancer    DR Lin Landsman had an interesting hypothesis, what if this were from some type of costochondritis that resulted in cartilaginous fracture and bleeding in the past of summer 2022.   The pt steadfastly denies trauma or IVDU .      In any case it is not cancer but likely inflammatory.   I have tried to call the patient multiple times.   She is living with a friend  off [address].     I have left two phone msg's on the patients cell  there is no answer,  I have tried to call at least 8 times between 4p and now to no avail   I am copying this phone note with Drs Lamonte Sakai and Kipp Brood.  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  She is well aware it at about the current MRI as she had a visit with Dr. Kipp Brood over the phone he explained the current findings to her.  She was not available when I tried to call her 2 weeks ago.  She agrees today to become primary care under my coverage as well as follow-up here for her pulmonary status.  She states she  is not having swallowing difficulty as she was before but has a burning sensation on the back of her neck she has bad dentition.  She feels a squeezing sensation in the chest neck area and pain in the left clavicle.  She also notes a slight increase swelling in the right side of the neck as well.  She has left-sided shoulder pain as well.  She states she did fall early in the spring down her stairs and tried to grab with her left hand she does not recall specifically injuring her shoulder at that visit.  Patient subsequent developed acute onset of the symptoms in the neck in June 2022.  She then saw her primary care provider who then referred her to me.  See my July 2022 note.  At that point we ordered imaging of the neck with a CT scan.  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  And other findings of mass effect behind the left clavicle and also involvement of the tip of the first rib on the left and mediastinal adenopathy and mass effect under the chest wall.  Serial imagings over a period of time including PET scan and 2 other MRIs showed the mass lesion is reducing in size and the most recent MRI in February of this year showed marked reduction of all areas of involvement.  Patient denies any infectious  disease history she denies any IV drug use.  No history of tuberculosis.  No fever chills or sweats.  Her weight is down somewhat.  She has not had any systemic blood draws.  Patient denies any further hemoptysis this only occurred in the summer 2022.  She is requesting a refill on a nitro drip patch which helped with bursitis of the right hip which she got from orthopedics in 2019.  She also wants her acyclovir refilled pill.  She would like refills on her chronic pain medicine.  She convened today with me today to see if any additional imaging studies can be performed and other evaluations be performed for this patient.  10/03/21   Patient seen in return visit and states she still having some posterior neck pain but the shoulder pain and sternoclavicular pain has nearly resolved.  She has complained of a new problem and that she cracked a molar in her jaw and has been taking some of the oxycodone for the jaw pain.  She states the indomethacin has helped to some degree but is causing GI upset.  Patient is in need of a mammogram and colon cancer screening and agrees to receive these.  On arrival blood pressure is good 117/81.  The patient still smokes a pack a day of cigarettes.  She also needs a Pap smear.  She has upcoming in April and May Social Security determination assessments for disability with orthopedics and psychiatry.  8/1 This is a return visit for this 56 year old female with chronic pain in the left shoulder left clavicular area and left neck.  She went to the emergency room on 25 July with complaints of progressive pain.  CT imaging of the neck and chest were unremarkable for swelling or other abnormalities.  All previous abnormalities from 2020 to have now resolved.  Patient yet is still requesting more Percocets.  She needs to sign a chronic pain management pain control contract at this time since she is still requiring Percocets.  Patient now is completely homeless living in her car for  the past 2 months this is exacerbated  some of her chronic pain in her hip neck and back as she has to sleep in the car.  On arrival blood pressure 114/78.  Patient does admit to using cannabis. The patient does not need Pap smears she has had a complete cold knife conization and negative Pap smear in 2018 was told did not need further.  Patient has been seen by orthopedics in the past may yet need another referral.  Patient remains without any insurance at this time.  She is failed to achieve disability on several attempts.  10/3 Patient seen by way of a phone visit follow-up from August 1.  She is still living in her car and stays at a friend's home is able to go in and shower wash close and occasionally take a nap in large lives in her [employer].  She is having car trouble today could not make it to the appointment.  Pain is mostly in the shoulder.  She is able to get by by taking 1 Percocet daily.  She just had a 50 count given on 20 September.  She has a pending pain management appointment in [place].  She also needs dental appointment upcoming for tooth extraction.  She is followed up with her mental health doctor who is represcribed her medications.  She cannot afford her co-pay at this time and is trying to place these on an account.  She has no other complaints at this visit.   Past Medical History:  Diagnosis Date   Bilateral carpal tunnel syndrome 03/19/2018   Cervical cancer (HCC)    Cervical cancer ([place])    Hemoptysis 01/11/2021   History of exercise stress test    ETT-Echo 5/18: normal EF, no ischemia   History of loop recorder    currently on person   Kidney stone    Pharyngeal dysphagia 01/11/2021    Past Surgical History:  Procedure Laterality Date   CERVICAL CONE BIOPSY     TUBAL LIGATION      Family History  Problem Relation Age of Onset   Heart attack Mother 57   Heart disease Father    Heart attack Father 67   Liver cancer Father    Bone cancer Other    Breast  cancer Maternal Aunt        Diagnosed in 59's   Bone cancer Maternal Aunt     Social History   Socioeconomic History   Marital status: Widowed    Spouse name: Not on file   Number of children: Not on file   Years of education: Not on file   Highest education level: Not on file  Occupational History   Occupation: unemployed  Tobacco Use   Smoking status: Every Day    Packs/day: 0.50    Years: 36.00    Total pack years: 18.00    Types: Cigarettes    Start date: 42   Smokeless tobacco: Never   Tobacco comments:    Smokes 1 pack a day ARJ 01/20/21  Vaping Use   Vaping Use: Never used  Substance and Sexual Activity   Alcohol use: No   Drug use: Yes    Types: Marijuana    Comment: stopped 04/06/2017   Sexual activity: Not Currently    Birth control/protection: None  Other Topics Concern   Not on file  Social History Narrative   Prior traffic control specialist - unemployed now   Widow   5 children, 10 grandchildren   Moved here from [place] 2013  Social Determinants of Health   Financial Resource Strain: Not on file  Food Insecurity: Not on file  Transportation Needs: Not on file  Physical Activity: Not on file  Stress: Not on file  Social Connections: Not on file  Intimate Partner Violence: Not on file    Outpatient Medications Prior to Visit  Medication Sig Dispense Refill   albuterol (VENTOLIN HFA) 108 (90 Base) MCG/ACT inhaler Inhale 2 puffs into the lungs every 6 (six) hours as needed for wheezing or shortness of breath. 6.7 g 2   cyclobenzaprine (FLEXERIL) 10 MG tablet Take 1 tablet (10 mg total) by mouth 3 (three) times daily as needed for muscle spasms. 30 tablet 2   ibuprofen (ADVIL) 600 MG tablet Take 1 tablet (600 mg total) by mouth every 8 (eight) hours as needed. 90 tablet 2   lamoTRIgine (LAMICTAL) 100 MG tablet Take 3 tablets (300 mg total) by mouth daily. 90 tablet 2   naloxone (NARCAN) nasal spray 4 mg/0.1 mL Use in case of accidental overdose 2  each 0   nitroGLYCERIN (NITRODUR - DOSED IN MG/24 HR) 0.2 mg/hr patch Apply 1/4 patch daily to the affected area 30 patch 1   omeprazole (PRILOSEC) 20 MG capsule Take 1 capsule (20 mg total) by mouth daily. 60 capsule 1   oxyCODONE-acetaminophen (PERCOCET) 10-325 MG tablet Take 1 tablet by mouth every 8 (eight) hours as needed for pain. 50 tablet 0   pravastatin (PRAVACHOL) 40 MG tablet Take 1 tablet (40 mg total) by mouth daily. 30 tablet 3   QUEtiapine (SEROQUEL) 200 MG tablet Take 1 tablet (200 mg total) by mouth at bedtime. 30 tablet 2   traZODone (DESYREL) 50 MG tablet Take 1 tablet (50 mg total) by mouth at bedtime as needed for sleep. 30 tablet 2   valACYclovir (VALTREX) 500 MG tablet Take 1 tablet (500 mg total) by mouth daily. 60 tablet 1   Vitamin D, Ergocalciferol, (DRISDOL) 1.25 MG (50000 UNIT) CAPS capsule Take 1 capsule (50,000 Units total) by mouth every 7 (seven) days. 5 capsule 5   No facility-administered medications prior to visit.    No Known Allergies  ROS Review of Systems  Constitutional:  Negative for chills, diaphoresis, fatigue, fever and unexpected weight change.  HENT:  Negative for dental problem, ear pain, postnasal drip, rhinorrhea, sinus pressure, sore throat, trouble swallowing and voice change.   Eyes: Negative.   Respiratory: Negative.  Negative for apnea, cough, choking, chest tightness, shortness of breath, wheezing and stridor.        No further hemoptysis  Cardiovascular:  Negative for chest pain, palpitations and leg swelling.  Gastrointestinal: Negative.  Negative for abdominal distention, abdominal pain, nausea and vomiting.  Genitourinary: Negative.   Musculoskeletal:  Positive for neck pain and neck stiffness. Negative for arthralgias and myalgias.       Left acromioclavicular joint pain  Skin: Negative.  Negative for rash.  Allergic/Immunologic: Negative.  Negative for environmental allergies and food allergies.  Neurological: Negative.   Negative for dizziness, syncope, weakness and headaches.  Hematological: Negative.  Negative for adenopathy. Does not bruise/bleed easily.  Psychiatric/Behavioral:  Negative for agitation, dysphoric mood and sleep disturbance. The patient is not nervous/anxious.       Objective:    Phone visit no exam  LMP 04/29/2012  Wt Readings from Last 3 Encounters:  03/08/22 180 lb (81.6 kg)  02/07/22 183 lb (83 kg)  01/30/22 169 lb 15.6 oz (77.1 kg)     Health Maintenance  Due  Topic Date Due   COLON CANCER SCREENING ANNUAL FOBT  Never done   INFLUENZA VACCINE  Never done    There are no preventive care reminders to display for this patient.  Lab Results  Component Value Date   TSH 2.640 09/05/2021   Lab Results  Component Value Date   WBC 6.8 01/31/2022   HGB 12.5 01/31/2022   HCT 37.2 01/31/2022   MCV 94.4 01/31/2022   PLT 339 01/31/2022   Lab Results  Component Value Date   NA 139 02/07/2022   K 4.3 02/07/2022   CO2 23 02/07/2022   GLUCOSE 107 (H) 02/07/2022   BUN 10 02/07/2022   CREATININE 1.08 (H) 02/07/2022   BILITOT 0.2 02/07/2022   ALKPHOS 129 (H) 02/07/2022   AST 11 02/07/2022   ALT 10 02/07/2022   PROT 6.7 02/07/2022   ALBUMIN 4.5 02/07/2022   CALCIUM 9.1 02/07/2022   ANIONGAP 6 01/31/2022   EGFR 61 02/07/2022   Lab Results  Component Value Date   CHOL 207 (H) 02/07/2022   Lab Results  Component Value Date   HDL 52 02/07/2022   Lab Results  Component Value Date   LDLCALC 119 (H) 02/07/2022   Lab Results  Component Value Date   TRIG 206 (H) 02/07/2022   Lab Results  Component Value Date   CHOLHDL 4.0 02/07/2022   Lab Results  Component Value Date   HGBA1C 5.7 (H) 02/07/2022  CT Neck 01/19/21 EXAM: CT NECK WITH CONTRAST   TECHNIQUE: Multidetector CT imaging of the neck was performed using the standard protocol following the bolus administration of intravenous contrast.   CONTRAST:  190m OMNIPAQUE IOHEXOL 350 MG/ML SOLN    COMPARISON:  08/19/2020   FINDINGS: Pharynx and larynx: No evidence of primary mass or inflammation along the mucosa   Salivary glands: No inflammation, mass, or stone.   Thyroid: Normal.   Lymph nodes: Bilateral jugular chain adenopathy. A right level 4 node measures 19 mm maximum on axial images. There is an infiltrative process at the left thoracic inlet and upper mediastinum, with sheet like soft tissue thickening posterior to the jugular notch and manubrium. Just anterior to the left IJ brachiocephalic confluence is a cystic appearing component measuring 12 mm. Soft tissue density extends anterior to the left first costochondral junction. There is reticulation and adjacent left upper lobe with partially covered layering left pleural effusion.   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   Limited intracranial: Negative   Visualized orbits: Negative   Mastoids and visualized paranasal sinuses: Clear   Skeleton: No erosion of ribs or joint spaces adjacent to the inflammation, although the left first costochondral junction is wider than the right.   Upper chest: Reticular opacity in the subpleural left upper lobe adjacent to the chest wall process.   These results will be called to the ordering clinician or representative by the Radiologist Assistant, and communication documented in the PACS or [employer]   IMPRESSION: Infiltrative process centered on the left chest wall, especially at left first costochondral junction, with regional adenopathy , mediastinal fat infiltration, and apical pulmonary opacity. Atypical infection or malignancy are both possible.   MRI 05/2021 FINDINGS: Along the inferomedial margin of the left subclavius muscle for example on image 26 of series 24 and also shown on image 26 of series 18, we demonstrate a somewhat infiltrative 2.7 by 2.2 by 2.6 cm (volume = 8.1 cm^3) process. When I  compare back to prior exams, the examinations of 01/18/2021 showed a substantially greater amount of abnormal enhancement in the retrosternal region, anterior mediastinum, and in the anterior left upper lobe adjacent to this lesion. By the time of the 02/03/2021 PET-CT, there was new bony destruction of the left anterior first rib but was not present on the earlier 01/27/2021 examinations. On today's exam there is substantial reduction in the overall previously enhancing soft tissue both in the subclavicular and subpectoral region and in the mediastinum, with anterior mediastinal and retrosternal involvement essentially resolved.   Moreover, the previous right level IV adenopathy in the lower neck shown on prior PET-CT has resolved. Please note that today's examination was not a diagnostic CT of the neck but does include the region of the prior enlarged right level IV lymph nodes. Prior AP window lymph node previously 1.1 cm in short axis diameter, currently 0.4 cm in short axis on image 46 series 17.   There is some accentuated enhancement medially in the left subclavian small cell compared to right for example on image 25 of series 24 which may be related to the underlying process.   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   IMPRESSION: 1. Substantial improvement in the abnormal infiltrative process in the left medial infraclavicular region, along the inferior margin of the medial left subclavius muscle. On previous exams this process demonstrated substantial mediastinal, substernal, and anterior left upper lobe involvement on the CT scans from July, and on the PET-CT had progressed to bony destructive findings in the  anterior left first rib. On today's examination, the pulmonary, mediastinal, and substernal components have resolved, rib involvement is now questionable, and the effect of this process on the adjacent internal jugular and subclavian veins is substantially reduced. Moreover, the right level IV adenopathy and mediastinal adenopathy shown on the prior PET-CT has resolved. The remaining enhancing lesion measures up to 2.7 cm in long axis. The substantial improvement without known specific therapy would seem to suggest inflammatory or infectious process, but I do note that the patient did not have leukocytosis back in August. Possibility of an underlying mass or vascular malformation which previously bled causing inflammatory response without substantial leukocytosis is a possibility, although the aggressive findings previously present along the left anterior first rib with tended favor either tumor or infection rather than the sequela of a hematoma. Overall given the substantial improvement, surveillance might be a reasonable option although open biopsy to definitively sample and/or remove the underlying lesion with likewise be a very reasonable choice. MRI chest 08/25/21 FINDINGS: Bones/Joint/Cartilage:   The previously demonstrated process anteriorly in the upper left chest wall between the left clavicular head and anterior aspect of the left 1st rib shows continued improvement. There is mild residual asymmetric soft tissue enhancement in this area which extends around the left sternoclavicular joint. No progressive bone destruction identified. No evidence of soft tissue mass.   Ligaments:   No ligamentous abnormalities identified.   Muscles and Tendons:   The overlying pectoralis musculature appears normal.   Soft tissue:   As above, mild residual soft tissue enhancement in the area of the previously demonstrated abnormality. No fluid collection or soft tissue mass  identified. The left subclavian and left internal jugular veins  appear patent.   IMPRESSION: 1. Further improvement in the previously demonstrated process involving the left anterior chest wall centered at the left 1st costochondral junction. The continued improvement favors a resolving inflammatory/infectious process. No mass lesion identified to suggest neoplasm. 2. This process was better demonstrated by CT, and if additional follow-is warranted clinically, consider neck CT with contrast.   CTs 01/2022 ED: CT Soft Tissue Neck W Contrast   Result Date: 01/31/2022 CLINICAL DATA:  56 year old female with history of a infiltrative process at the left anterior 1st rib, superior mediastinum 1 year ago in July 2022. Biopsied x 2 with pathology NOT revealing malignancy, and subsequent resolution on CT and MRI suggesting an infectious or inflammatory process. Recurrent left neck and shoulder pain for 1.5 weeks. EXAM: CT NECK WITH CONTRAST TECHNIQUE: Multidetector CT imaging of the neck was performed using the standard protocol following the bolus administration of intravenous contrast. RADIATION DOSE REDUCTION: This exam was performed according to the departmental dose-optimization program which includes automated exposure control, adjustment of the mA and/or kV according to patient size and/or use of iterative reconstruction technique. CONTRAST:  31m OMNIPAQUE IOHEXOL 300 MG/ML  SOLN COMPARISON:  Neck CT 09/15/2021, 01/18/2021. Chest CT today reported separately. FINDINGS: Pharynx and larynx: Oropharynx motion artifact today. Pharyngeal and laryngeal soft tissue contours remain stable and within normal limits. Negative visible parapharyngeal and retropharyngeal spaces. Salivary glands: Negative. Motion artifact at the sublingual space which seems to remain negative. Thyroid: Negative. Lymph nodes: Left level 4 lymph nodes at the left thoracic inlet are small and within normal limits on series 4, image 75,  stable since March. There is a larger 7-8 mm node at the junction of left level 3 and 4 on series 4, image 60, but also stable since March, smaller from last year, and within normal limits by size criteria. Other bilateral level 2 and level 3 nodes also are within normal limits and smaller since July of last year. No cystic or necrotic nodes. Vascular: Major vascular structures in the neck and at the skull base including both internal jugular veins are patent. Left greater than right cervical carotid atherosclerosis. Limited intracranial: Mild Calcified atherosclerosis at the skull base. Negative visible brain parenchyma. Visualized orbits: Negative. Mastoids and visualized paranasal sinuses: Visualized paranasal sinuses and mastoids are stable and well aerated. Skeleton: Motion artifact, especially the mandible. Chronic C5-C6 disc and endplate degeneration. No acute or suspicious osseous lesion identified in the neck. Upper chest: Chest CT today is reported separately. IMPRESSION: 1. No acute or inflammatory process identified in the neck. Stable since March and satisfactory CT appearance of the Neck, with resolved lymphadenopathy and left thoracic inlet inflammation since 2022. 2. Chest CT today reported separately. Electronically Signed   By: HGenevie AnnM.D.   On: 01/31/2022 10:04    CT Chest W Contrast   Result Date: 01/31/2022 CLINICAL DATA:  Left neck and shoulder pain for 1.5 weeks. History of tumor. EXAM: CT CHEST WITH CONTRAST TECHNIQUE: Multidetector CT imaging of the chest was performed during intravenous contrast administration. RADIATION DOSE REDUCTION: This exam was performed according to the departmental dose-optimization program which includes automated exposure control, adjustment of the mA and/or kV according to patient size and/or use of iterative reconstruction technique. CONTRAST:  770mOMNIPAQUE IOHEXOL 300 MG/ML  SOLN COMPARISON:  CT chest 09/16/2018 FINDINGS: Cardiovascular: Heart size is  normal. There is no pericardial effusion. Mild calcifications of the left anterior descending artery are noted. The remainder of the thoracic vasculature is  unremarkable. Mediastinum/Nodes: The thyroid is unremarkable. The esophagus is grossly unremarkable. There is no mediastinal, hilar, or axillary lymphadenopathy. Lungs/Pleura: The trachea and central airways are patent. The lungs clear. There is no focal consolidation or pulmonary edema. There is no pleural effusion or pneumothorax. There is upper lobe predominant emphysema, stable. There is a 5 mm nodule in the posteromedial left lower lobe (4-102), unchanged since the study from 08/19/2020 and 01/19/2020. No specific imaging follow-up is required for this nodule. There are no new or enlarging nodules. Upper Abdomen: The imaged portions of the upper abdominal viscera are unremarkable. Musculoskeletal: There is no acute osseous abnormality or suspicious osseous lesion. IMPRESSION: 1. No acute process in the chest or significant change since 09/16/2018. No lymphadenopathy identified. 2. Emphysema (ICD10-J43.9). Electronically Signed   By: Valetta Mole M.D.   On: 01/31/2022 10:01        Assessment & Plan:   Problem List Items Addressed This Visit       Musculoskeletal and Integument   Costochondritis    Continue with anti-inflammatories        Other   Homelessness    Connect with care management      Chronic pain syndrome    Pain clinic referral is pending we will refill 1 more 50 count Percocet in the next 10 days and then she will need to follow-up with pain management Drug database checked     No orders of the defined types were placed in this encounter. Follow Up Instructions: Patient will have follow-up scheduled in January   I discussed the assessment and treatment plan with the patient. The patient was provided an opportunity to ask questions and all were answered. The patient agreed with the plan and demonstrated an  understanding of the instructions.   The patient was advised to call back or seek an in-person evaluation if the symptoms worsen or if the condition fails to improve as anticipated.  I provided 21 minutes of non-face-to-face time during this encounter  including  median intraservice time , review of notes, labs, imaging, medications  and explaining diagnosis and management to the patient .    Asencion Noble, MD  Follow-up: Return for chronic conditions, back pain.

## 2022-04-11 ENCOUNTER — Encounter: Payer: Self-pay | Admitting: Critical Care Medicine

## 2022-04-11 ENCOUNTER — Other Ambulatory Visit (HOSPITAL_COMMUNITY): Payer: Self-pay

## 2022-04-11 ENCOUNTER — Ambulatory Visit: Payer: Medicaid Other | Attending: Critical Care Medicine | Admitting: Critical Care Medicine

## 2022-04-11 DIAGNOSIS — Z5900 Homelessness unspecified: Secondary | ICD-10-CM | POA: Diagnosis not present

## 2022-04-11 DIAGNOSIS — G894 Chronic pain syndrome: Secondary | ICD-10-CM | POA: Diagnosis not present

## 2022-04-11 DIAGNOSIS — M940 Chondrocostal junction syndrome [Tietze]: Secondary | ICD-10-CM | POA: Diagnosis not present

## 2022-04-11 MED ORDER — OXYCODONE-ACETAMINOPHEN 10-325 MG PO TABS
1.0000 | ORAL_TABLET | Freq: Three times a day (TID) | ORAL | 0 refills | Status: DC | PRN
  Filled 2022-04-11 – 2022-04-13 (×2): qty 50, 17d supply, fill #0

## 2022-04-11 NOTE — Assessment & Plan Note (Signed)
Connect with care management

## 2022-04-11 NOTE — Assessment & Plan Note (Signed)
Pain clinic referral is pending we will refill 1 more 50 count Percocet in the next 10 days and then she will need to follow-up with pain management Drug database checked

## 2022-04-11 NOTE — Assessment & Plan Note (Signed)
Continue with anti-inflammatories

## 2022-04-14 ENCOUNTER — Other Ambulatory Visit (HOSPITAL_COMMUNITY): Payer: Self-pay

## 2022-05-02 ENCOUNTER — Other Ambulatory Visit (HOSPITAL_COMMUNITY): Payer: Self-pay

## 2022-05-14 DIAGNOSIS — H5213 Myopia, bilateral: Secondary | ICD-10-CM | POA: Diagnosis not present

## 2022-05-15 ENCOUNTER — Other Ambulatory Visit (HOSPITAL_COMMUNITY): Payer: Self-pay

## 2022-05-15 ENCOUNTER — Encounter: Payer: Self-pay | Admitting: Critical Care Medicine

## 2022-05-15 MED ORDER — IBUPROFEN 600 MG PO TABS
600.0000 mg | ORAL_TABLET | Freq: Three times a day (TID) | ORAL | 2 refills | Status: DC | PRN
  Filled 2022-05-15: qty 90, 30d supply, fill #0

## 2022-05-15 MED ORDER — OXYCODONE-ACETAMINOPHEN 10-325 MG PO TABS
1.0000 | ORAL_TABLET | Freq: Three times a day (TID) | ORAL | 0 refills | Status: DC | PRN
  Filled 2022-05-15: qty 60, 20d supply, fill #0

## 2022-05-15 NOTE — Telephone Encounter (Signed)
Refills on pain medicaitons  PDMP checked  no evidence of drug diversion

## 2022-05-16 ENCOUNTER — Other Ambulatory Visit (HOSPITAL_COMMUNITY): Payer: Self-pay

## 2022-05-19 ENCOUNTER — Other Ambulatory Visit (HOSPITAL_COMMUNITY): Payer: Self-pay

## 2022-05-19 MED ORDER — AMOXICILLIN 500 MG PO CAPS
500.0000 mg | ORAL_CAPSULE | Freq: Three times a day (TID) | ORAL | 0 refills | Status: DC
  Filled 2022-05-19: qty 21, 7d supply, fill #0

## 2022-05-26 ENCOUNTER — Other Ambulatory Visit (HOSPITAL_COMMUNITY): Payer: Self-pay

## 2022-06-05 ENCOUNTER — Other Ambulatory Visit (HOSPITAL_COMMUNITY): Payer: Self-pay

## 2022-06-05 ENCOUNTER — Encounter: Payer: Self-pay | Admitting: Critical Care Medicine

## 2022-06-05 DIAGNOSIS — M7061 Trochanteric bursitis, right hip: Secondary | ICD-10-CM

## 2022-06-05 DIAGNOSIS — G894 Chronic pain syndrome: Secondary | ICD-10-CM

## 2022-06-05 DIAGNOSIS — M7502 Adhesive capsulitis of left shoulder: Secondary | ICD-10-CM

## 2022-06-05 MED ORDER — IBUPROFEN 600 MG PO TABS
600.0000 mg | ORAL_TABLET | Freq: Three times a day (TID) | ORAL | 2 refills | Status: AC | PRN
  Filled 2022-06-05: qty 90, 30d supply, fill #0
  Filled 2022-07-12 – 2022-07-21 (×2): qty 90, 30d supply, fill #1
  Filled 2022-12-27: qty 90, 30d supply, fill #2

## 2022-06-05 MED ORDER — OXYCODONE-ACETAMINOPHEN 10-325 MG PO TABS
1.0000 | ORAL_TABLET | Freq: Three times a day (TID) | ORAL | 0 refills | Status: DC | PRN
  Filled 2022-06-05: qty 60, 20d supply, fill #0

## 2022-06-09 ENCOUNTER — Other Ambulatory Visit (HOSPITAL_COMMUNITY): Payer: Self-pay

## 2022-06-19 ENCOUNTER — Ambulatory Visit: Payer: Medicaid Other | Admitting: Orthopedic Surgery

## 2022-06-21 ENCOUNTER — Ambulatory Visit (INDEPENDENT_AMBULATORY_CARE_PROVIDER_SITE_OTHER): Payer: Medicaid Other | Admitting: Orthopedic Surgery

## 2022-06-21 DIAGNOSIS — M25512 Pain in left shoulder: Secondary | ICD-10-CM | POA: Diagnosis not present

## 2022-06-22 ENCOUNTER — Encounter: Payer: Self-pay | Admitting: Orthopedic Surgery

## 2022-06-22 NOTE — Progress Notes (Signed)
Office Visit Note   Patient: Katie Sherman           Date of Birth: December 09, 1965           MRN: [id number] Visit Date: 06/21/2022 Requested by: Elsie Stain, MD [address],  [place] [id number] PCP: Elsie Stain, MD  Subjective: Chief Complaint  Patient presents with   Left Shoulder - Pain    HPI: Katie Sherman is a 56 y.o. female who presents to the office reporting left shoulder pain.  She has had pain for 5 years.  She states "I have a history of frozen shoulder".  Describes decreased range of motion and reports constant pain.  She is right-hand dominant.  Last year she was treated for mass in the shoulder and chest region.  That turned out to be nonoperative and noncancerous.  She had an injection in 2018 and 2019 with some relief.  No prior shoulder surgery.  She currently is not working.  Has a disability hearing pending.  Takes oxycodone 4 times a day.  Starts pain management next Monday.  Uses nitro patches for her right hip bursitis.  She has had history of 3 cortisone shots in her hip.  No history of DVT or anticoagulants..                ROS: All systems reviewed are negative as they relate to the chief complaint within the history of present illness.  Patient denies fevers or chills.  Assessment & Plan: Visit Diagnoses:  1. Left shoulder pain, unspecified chronicity     Plan: Impression is left shoulder pain.  Range of motion pretty reasonable on the left-hand side.  Does not really look like frozen shoulder now.  Plan is MRI arthrogram left shoulder to evaluate biceps tendon and rotator cuff tear.  Follow-up after that study  Follow-Up Instructions: No follow-ups on file.   Orders:  Orders Placed This Encounter  Procedures   MR Shoulder Left w/ contrast   Arthrogram   No orders of the defined types were placed in this encounter.     Procedures: No procedures performed   Clinical Data: No additional  findings.  Objective: Vital Signs: LMP 04/29/2012   Physical Exam:  Constitutional: Patient appears well-developed HEENT:  Head: Normocephalic Eyes:EOM are normal Neck: Normal range of motion Cardiovascular: Normal rate Pulmonary/chest: Effort normal Neurologic: Patient is alert Skin: Skin is warm Psychiatric: Patient has normal mood and affect  Ortho Exam: Ortho exam demonstrates passive range of motion on the left 75/105/165.  On the right it is the same except for slightly more forward flexion 180.  Has pretty reasonable strength infraspinatus extremity subscap muscle testing.  Does have positive O'Brien's testing.'s mild AC joint tenderness direct palpation.  No definite coarseness or grinding with internal/external rotation of the left shoulder.  Specialty Comments:  No specialty comments available.  Imaging: No results found.   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   Postmenopausal 01/22/2017   Bipolar 1 disorder, depressed ([place]) 12/29/2016   Family history of suicide 11/20/2016   Depression 11/20/2016   Family history of breast cancer 11/20/2016  Adhesive capsulitis of left shoulder 11/19/2016   Greater trochanteric bursitis of right hip 11/19/2016   Past Medical History:  Diagnosis Date   Bilateral carpal tunnel syndrome 03/19/2018   Cervical cancer ([facility])    Cervical cancer ([place])    Hemoptysis 01/11/2021   History of exercise stress test    ETT-Echo 5/18: normal EF, no ischemia   History of loop recorder    currently on person   Kidney stone    Pharyngeal dysphagia 01/11/2021    Family History  Problem Relation Age of  Onset   Heart attack Mother 52   Heart disease Father    Heart attack Father 15   Liver cancer Father    Bone cancer Other    Breast cancer Maternal Aunt        Diagnosed in 60's   Bone cancer Maternal Aunt     Past Surgical History:  Procedure Laterality Date   CERVICAL CONE BIOPSY     TUBAL LIGATION     Social History   Occupational History   Occupation: unemployed  Tobacco Use   Smoking status: Every Day    Packs/day: 0.50    Years: 36.00    Total pack years: 18.00    Types: Cigarettes    Start date: 1983   Smokeless tobacco: Never   Tobacco comments:    Smokes 1 pack a day [facility] 01/20/21  Vaping Use   Vaping Use: Never used  Substance and Sexual Activity   Alcohol use: No   Drug use: Yes    Types: Marijuana    Comment: stopped 04/06/2017   Sexual activity: Not Currently    Birth control/protection: None

## 2022-06-26 ENCOUNTER — Encounter (HOSPITAL_COMMUNITY): Payer: Self-pay | Admitting: Psychiatry

## 2022-06-26 ENCOUNTER — Telehealth (HOSPITAL_BASED_OUTPATIENT_CLINIC_OR_DEPARTMENT_OTHER): Payer: Medicaid Other | Admitting: Psychiatry

## 2022-06-26 ENCOUNTER — Other Ambulatory Visit (HOSPITAL_COMMUNITY): Payer: Self-pay

## 2022-06-26 VITALS — Wt 165.0 lb

## 2022-06-26 DIAGNOSIS — F1210 Cannabis abuse, uncomplicated: Secondary | ICD-10-CM

## 2022-06-26 DIAGNOSIS — F319 Bipolar disorder, unspecified: Secondary | ICD-10-CM

## 2022-06-26 DIAGNOSIS — F4310 Post-traumatic stress disorder, unspecified: Secondary | ICD-10-CM

## 2022-06-26 MED ORDER — TRAZODONE HCL 50 MG PO TABS
50.0000 mg | ORAL_TABLET | Freq: Every evening | ORAL | 2 refills | Status: DC | PRN
  Filled 2022-06-26 – 2022-09-08 (×2): qty 30, 30d supply, fill #0

## 2022-06-26 MED ORDER — QUETIAPINE FUMARATE 200 MG PO TABS
200.0000 mg | ORAL_TABLET | Freq: Every day | ORAL | 2 refills | Status: DC
  Filled 2022-06-26 – 2022-09-08 (×2): qty 30, 30d supply, fill #0

## 2022-06-26 MED ORDER — LAMOTRIGINE 100 MG PO TABS
300.0000 mg | ORAL_TABLET | Freq: Every day | ORAL | 2 refills | Status: DC
  Filled 2022-06-26 – 2022-07-12 (×2): qty 90, 30d supply, fill #0
  Filled 2022-08-14: qty 90, 30d supply, fill #1
  Filled 2022-09-08 (×2): qty 90, 30d supply, fill #2
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
## 2022-06-27 ENCOUNTER — Other Ambulatory Visit: Payer: Self-pay | Admitting: Critical Care Medicine

## 2022-06-27 ENCOUNTER — Encounter: Payer: Self-pay | Admitting: Critical Care Medicine

## 2022-06-28 ENCOUNTER — Other Ambulatory Visit: Payer: Self-pay

## 2022-06-28 ENCOUNTER — Other Ambulatory Visit (HOSPITAL_COMMUNITY): Payer: Self-pay

## 2022-06-28 MED ORDER — OXYCODONE-ACETAMINOPHEN 10-325 MG PO TABS
1.0000 | ORAL_TABLET | Freq: Three times a day (TID) | ORAL | 0 refills | Status: DC | PRN
  Filled 2022-06-28: qty 60, 20d supply, fill #0

## 2022-07-12 ENCOUNTER — Other Ambulatory Visit: Payer: Self-pay | Admitting: Critical Care Medicine

## 2022-07-12 ENCOUNTER — Other Ambulatory Visit (HOSPITAL_COMMUNITY): Payer: Self-pay

## 2022-07-12 DIAGNOSIS — K219 Gastro-esophageal reflux disease without esophagitis: Secondary | ICD-10-CM

## 2022-07-12 MED ORDER — OMEPRAZOLE 20 MG PO CPDR
20.0000 mg | DELAYED_RELEASE_CAPSULE | Freq: Every day | ORAL | 0 refills | Status: DC
  Filled 2022-07-12: qty 30, 30d supply, fill #0

## 2022-07-13 ENCOUNTER — Other Ambulatory Visit: Payer: Self-pay

## 2022-07-14 ENCOUNTER — Other Ambulatory Visit (HOSPITAL_COMMUNITY): Payer: Self-pay

## 2022-07-17 NOTE — Progress Notes (Deleted)
Established Patient Office Visit  Subjective:  Patient ID: Katie Sherman, female    DOB: 05-25-1966  Age: 57 y.o. MRN: [id number]  History of Present Illness:    CC: Chronic pain in the left side of the neck and clavicle  HPI  09/05/21 Katie Sherman presents for primary care to establish and follow-up of chronic condition of the chest and neck. Below are series of the previous documentations I have on this patient when I have seen her 1 time only in July 2022 for pulmonary consultation by her patient's primary care provider Dr. Wynetta Emery.  Subsequent to this the patient was cared for by pulmonary medicine Dr. Lamonte Sakai and thoracic surgery Dr. Kipp Brood and was seen 1 time by infectious disease Dr. Linus Salmons.  01/11/21 This a 57 year old female referred for hemoptysis difficulty swallowing and neck fullness, neck pain and supraclavicular fullness bilaterally of 4 weeks duration.  The patient's primary care provider is Dr. Wynetta Emery.  The patient's hemoptysis is pure blood with clots not mixed with mucus.  She had an episode of minimal hemoptysis a year ago last summer in 2021.  This went away she then developed COVID to beginning portion of 2022 recovered from this.  Patient began having neck swelling and pain 4 weeks ago and presented for evaluations both in the clinic in the emergency room.  Evaluations were not revealing.   At that time she was not having hemoptysis but over the past week she has had increased coughing up of blood combination with progressive swelling in the neck in the supraclavicular areas.  She is also having difficulty with swallowing as well.  Patient does smoke a pack a day of cigarettes and is under a lot of stress unable to reduce her tobacco intake.  She does have some dyspnea on exertion but no wheezing.  She is never been diagnosed with pneumonia.  She did have a CT of the chest in February of this year showing some patchy basilar groundglass changes.  She has also chronic  subpleural nodules which have been unchanged compared to previous exam in 2021 versus February 2022.  The patient's been out of work the last 2 weeks because of the difficulty she is now experiencing.   Patient's past medical history significant for cervical cancer in her self and family history of breast cancer.  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   Note ED visit 01/01/21 Katie Sherman is a 57 y.o. female with past medical history of tobacco use, cervical cancer.  Patient presents to the emergency department with a chief complaint of swelling and tenderness to her neck.  Patient reports that her symptoms started on Wednesday and were located on the left side of her neck.  Patient also endorsed that she was having some difficulty and pain when swallowing liquids but "only on the left side of my throat."  She reports that Thursday morning swelling and pain wear to both sides of her throat.  Reports progressive worsening of symptoms on Friday.  Patient reports that she tried to take your prescribed Acacian Friday morning but it "felt like my throat was closing up.".  States that it felt like one of her pills had become stuck in her throat.  Patient reports that she "stuck a wooden spoon down my throat," in  an attempt to push the pill further along.  Patient reports that she had an episode of vomiting after this.     At present patient reports that pain is only located to the right side of her neck.  Patient rates her pain 10/10 on the pain scale.  Patient reports that pain is worse with movement and touch.  Patient reports that she has had some relief with ibuprofen earlier in the week but has not taken any today.    08/26/21 phone note I attempted to call the patient to inform her about  her MRI results. Note there was a phone virtual visit with Dr Kipp Brood who spoke to the patient around 2pm today but due to high ED volume the MRI had yet to read at that point. I have received two very long MyChart messages from the patient this afternoon expressing her frustrations        I spoke to Dr Camie Patience, Chief of Radiology [facility] on the results  He and I reviewed all prior imaging to date.   The process appears to have started behind the sternoclavicular joint and first rib at the cartilaginous junction posteriorly.  It has significantly regressed and all the reactive mediastinal cervical and axillary adenopathy has disappeared   There is no cervical spine pathology.  The interna Jugular is patent .  As this was a combined MRI MRA study all great vessels to and from the heart are NORMAL   This does NOT represent Cancer    DR Lin Landsman had an interesting hypothesis, what if this were from some type of costochondritis that resulted in cartilaginous fracture and bleeding in the past of summer 2022.   The pt steadfastly denies trauma or IVDU .      In any case it is not cancer but likely inflammatory.   I have tried to call the patient multiple times.   She is living with a friend off [address].     I have left two phone msg's on the patients cell  there is no answer,  I have tried to call at least 8 times between 4p and now to no avail   I am copying this phone note with Drs Lamonte Sakai and Kipp Brood.  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  She is well aware it at about the current MRI as she had a visit with Dr. Kipp Brood over the phone he explained the current findings to her.  She was not available when I tried to call her 2 weeks ago.  She agrees today to become primary care under my coverage as well as follow-up here for her pulmonary status.  She states she is not having swallowing difficulty  as she was before but has a burning sensation on the back of her neck she has bad dentition.  She feels a squeezing sensation in the chest neck area and pain in the left clavicle.  She also notes a slight increase swelling in the right side of the neck as well.  She has left-sided shoulder pain as well.  She states she did fall early in the spring down her stairs and tried to grab with her left hand she does not recall specifically injuring her shoulder at that visit.  Patient subsequent developed acute onset of the symptoms in the neck in June 2022.  She then saw her primary care provider who then referred  her to me.  See my July 2022 note.  At that point we ordered imaging of the neck with a CT scan.  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  And other findings of mass effect behind the left clavicle and also involvement of the tip of the first rib on the left and mediastinal adenopathy and mass effect under the chest wall.  Serial imagings over a period of time including PET scan and 2 other MRIs showed the mass lesion is reducing in size and the most recent MRI in February of this year showed marked reduction of all areas of involvement.  Patient denies any infectious disease history she denies any IV drug use.  No history of tuberculosis.  No fever chills or sweats.  Her weight is down somewhat.  She has not had any systemic blood draws.  Patient denies any further hemoptysis this only occurred in the summer 2022.  She is requesting a refill on a nitro drip patch which helped with bursitis of the right hip which she got from orthopedics in 2019.  She also wants her acyclovir refilled pill.  She would like refills on her chronic pain medicine.  She convened today with me today to see if any additional imaging studies can be performed and other evaluations be performed for this patient.  10/03/21   Patient seen in return visit and states she still having some posterior neck pain  but the shoulder pain and sternoclavicular pain has nearly resolved.  She has complained of a new problem and that she cracked a molar in her jaw and has been taking some of the oxycodone for the jaw pain.  She states the indomethacin has helped to some degree but is causing GI upset.  Patient is in need of a mammogram and colon cancer screening and agrees to receive these.  On arrival blood pressure is good 117/81.  The patient still smokes a pack a day of cigarettes.  She also needs a Pap smear.  She has upcoming in April and May Social Security determination assessments for disability with orthopedics and psychiatry.  8/1 This is a return visit for this 57 year old female with chronic pain in the left shoulder left clavicular area and left neck.  She went to the emergency room on 25 July with complaints of progressive pain.  CT imaging of the neck and chest were unremarkable for swelling or other abnormalities.  All previous abnormalities from 2020 to have now resolved.  Patient yet is still requesting more Percocets.  She needs to sign a chronic pain management pain control contract at this time since she is still requiring Percocets.  Patient now is completely homeless living in her car for the past 2 months this is exacerbated some of her chronic pain in her hip neck and back as she has to sleep in the car.  On arrival blood pressure 114/78.  Patient does admit to using cannabis. The patient does not need Pap smears she has had a complete cold knife conization and negative Pap smear in 2018 was told did not need further.  Patient has been seen by orthopedics in the past may yet need another referral.  Patient remains without any insurance at this time.  She is failed to achieve disability on several attempts.  10/3 Patient seen by way of a phone visit follow-up from August 1.  She is still living in her car and stays at a friend's home is able to go  in and shower wash close and occasionally take a nap  in large lives in her [employer].  She is having car trouble today could not make it to the appointment.  Pain is mostly in the shoulder.  She is able to get by by taking 1 Percocet daily.  She just had a 50 count given on 20 September.  She has a pending pain management appointment in [place].  She also needs dental appointment upcoming for tooth extraction.  She is followed up with her mental health doctor who is represcribed her medications.  She cannot afford her co-pay at this time and is trying to place these on an account.  She has no other complaints at this visit.   07/18/22 Costochondritis       Continue with anti-inflammatories            Other    Homelessness      Connect with care management        Chronic pain syndrome      Pain clinic referral is pending we will refill 1 more 50 count Percocet in the next 10 days and then she will need to follow-up with pain management Drug database checked      No orders of the defined types were placed in this encounter. Past Medical History:  Diagnosis Date   Bilateral carpal tunnel syndrome 03/19/2018   Cervical cancer (HCC)    Cervical cancer ([place])    Hemoptysis 01/11/2021   History of exercise stress test    ETT-Echo 5/18: normal EF, no ischemia   History of loop recorder    currently on person   Kidney stone    Pharyngeal dysphagia 01/11/2021    Past Surgical History:  Procedure Laterality Date   CERVICAL CONE BIOPSY     TUBAL LIGATION      Family History  Problem Relation Age of Onset   Heart attack Mother 68   Heart disease Father    Heart attack Father 37   Liver cancer Father    Bone cancer Other    Breast cancer Maternal Aunt        Diagnosed in 75's   Bone cancer Maternal Aunt     Social History   Socioeconomic History   Marital status: Widowed    Spouse name: Not on file   Number of children: Not on file   Years of education: Not on file   Highest education level: Not on file  Occupational History    Occupation: unemployed  Tobacco Use   Smoking status: Every Day    Packs/day: 0.50    Years: 36.00    Total pack years: 18.00    Types: Cigarettes    Start date: 26   Smokeless tobacco: Never   Tobacco comments:    Smokes 1 pack a day ARJ 01/20/21  Vaping Use   Vaping Use: Never used  Substance and Sexual Activity   Alcohol use: No   Drug use: Yes    Types: Marijuana    Comment: stopped 04/06/2017   Sexual activity: Not Currently    Birth control/protection: None  Other Topics Concern   Not on file  Social History Narrative   Prior traffic control specialist - unemployed now   Widow   5 children, 10 grandchildren   Moved here from [place] 2013   Social Determinants of Health   Financial Resource Strain: Not on file  Food Insecurity: Not on file  Transportation Needs: Not on file  Physical Activity: Not on file  Stress: Not on file  Social Connections: Not on file  Intimate Partner Violence: Not on file    Outpatient Medications Prior to Visit  Medication Sig Dispense Refill   albuterol (VENTOLIN HFA) 108 (90 Base) MCG/ACT inhaler Inhale 2 puffs into the lungs every 6 (six) hours as needed for wheezing or shortness of breath. 6.7 g 2   amoxicillin (AMOXIL) 500 MG capsule Take 1 capsule (500 mg total) by mouth every 8 (eight) hours, for 7 days. 21 capsule 0   cyclobenzaprine (FLEXERIL) 10 MG tablet Take 1 tablet (10 mg total) by mouth 3 (three) times daily as needed for muscle spasms. 30 tablet 2   ibuprofen (ADVIL) 600 MG tablet Take 1 tablet (600 mg total) by mouth every 8 (eight) hours as needed. 90 tablet 2   lamoTRIgine (LAMICTAL) 100 MG tablet Take 3 tablets (300 mg total) by mouth daily. 90 tablet 2   naloxone (NARCAN) nasal spray 4 mg/0.1 mL Use in case of accidental overdose 2 each 0   nitroGLYCERIN (NITRODUR - DOSED IN MG/24 HR) 0.2 mg/hr patch Apply 1/4 patch daily to the affected area 30 patch 1   omeprazole (PRILOSEC) 20 MG capsule Take 1 capsule (20 mg  total) by mouth daily. 30 capsule 0   oxyCODONE-acetaminophen (PERCOCET) 10-325 MG tablet Take 1 tablet by mouth every 8 (eight) hours as needed for pain. 60 tablet 0   pravastatin (PRAVACHOL) 40 MG tablet Take 1 tablet (40 mg total) by mouth daily. 30 tablet 3   QUEtiapine (SEROQUEL) 200 MG tablet Take 1 tablet (200 mg total) by mouth at bedtime. 30 tablet 2   traZODone (DESYREL) 50 MG tablet Take 1 tablet (50 mg total) by mouth at bedtime as needed for sleep. 30 tablet 2   valACYclovir (VALTREX) 500 MG tablet Take 1 tablet (500 mg total) by mouth daily. 60 tablet 1   Vitamin D, Ergocalciferol, (DRISDOL) 1.25 MG (50000 UNIT) CAPS capsule Take 1 capsule (50,000 Units total) by mouth every 7 (seven) days. 5 capsule 5   No facility-administered medications prior to visit.    No Known Allergies  ROS Review of Systems  Constitutional:  Negative for chills, diaphoresis, fatigue, fever and unexpected weight change.  HENT:  Negative for dental problem, ear pain, postnasal drip, rhinorrhea, sinus pressure, sore throat, trouble swallowing and voice change.   Eyes: Negative.   Respiratory: Negative.  Negative for apnea, cough, choking, chest tightness, shortness of breath, wheezing and stridor.        No further hemoptysis  Cardiovascular:  Negative for chest pain, palpitations and leg swelling.  Gastrointestinal: Negative.  Negative for abdominal distention, abdominal pain, nausea and vomiting.  Genitourinary: Negative.   Musculoskeletal:  Positive for neck pain and neck stiffness. Negative for arthralgias and myalgias.       Left acromioclavicular joint pain  Skin: Negative.  Negative for rash.  Allergic/Immunologic: Negative.  Negative for environmental allergies and food allergies.  Neurological: Negative.  Negative for dizziness, syncope, weakness and headaches.  Hematological: Negative.  Negative for adenopathy. Does not bruise/bleed easily.  Psychiatric/Behavioral:  Negative for agitation,  dysphoric mood and sleep disturbance. The patient is not nervous/anxious.       Objective:    Phone visit no exam  LMP 04/29/2012  Wt Readings from Last 3 Encounters:  03/08/22 180 lb (81.6 kg)  02/07/22 183 lb (83 kg)  01/30/22 169 lb 15.6 oz (77.1 kg)     Health Maintenance  Due  Topic Date Due   COLON CANCER SCREENING ANNUAL FOBT  Never done   INFLUENZA VACCINE  Never done    There are no preventive care reminders to display for this patient.  Lab Results  Component Value Date   TSH 2.640 09/05/2021   Lab Results  Component Value Date   WBC 6.8 01/31/2022   HGB 12.5 01/31/2022   HCT 37.2 01/31/2022   MCV 94.4 01/31/2022   PLT 339 01/31/2022   Lab Results  Component Value Date   NA 139 02/07/2022   K 4.3 02/07/2022   CO2 23 02/07/2022   GLUCOSE 107 (H) 02/07/2022   BUN 10 02/07/2022   CREATININE 1.08 (H) 02/07/2022   BILITOT 0.2 02/07/2022   ALKPHOS 129 (H) 02/07/2022   AST 11 02/07/2022   ALT 10 02/07/2022   PROT 6.7 02/07/2022   ALBUMIN 4.5 02/07/2022   CALCIUM 9.1 02/07/2022   ANIONGAP 6 01/31/2022   EGFR 61 02/07/2022   Lab Results  Component Value Date   CHOL 207 (H) 02/07/2022   Lab Results  Component Value Date   HDL 52 02/07/2022   Lab Results  Component Value Date   LDLCALC 119 (H) 02/07/2022   Lab Results  Component Value Date   TRIG 206 (H) 02/07/2022   Lab Results  Component Value Date   CHOLHDL 4.0 02/07/2022   Lab Results  Component Value Date   HGBA1C 5.7 (H) 02/07/2022  CT Neck 01/19/21 EXAM: CT NECK WITH CONTRAST   TECHNIQUE: Multidetector CT imaging of the neck was performed using the standard protocol following the bolus administration of intravenous contrast.   CONTRAST:  147m OMNIPAQUE IOHEXOL 350 MG/ML SOLN   COMPARISON:  08/19/2020   FINDINGS: Pharynx and larynx: No evidence of primary mass or inflammation along the mucosa   Salivary glands: No inflammation, mass, or stone.   Thyroid: Normal.    Lymph nodes: Bilateral jugular chain adenopathy. A right level 4 node measures 19 mm maximum on axial images. There is an infiltrative process at the left thoracic inlet and upper mediastinum, with sheet like soft tissue thickening posterior to the jugular notch and manubrium. Just anterior to the left IJ brachiocephalic confluence is a cystic appearing component measuring 12 mm. Soft tissue density extends anterior to the left first costochondral junction. There is reticulation and adjacent left upper lobe with partially covered layering left pleural effusion.   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   Limited intracranial: Negative   Visualized orbits: Negative   Mastoids and visualized paranasal sinuses: Clear   Skeleton: No erosion of ribs or joint spaces adjacent to the inflammation, although the left first costochondral junction is wider than the right.   Upper chest: Reticular opacity in the subpleural left upper lobe adjacent to the chest wall process.   These results will be called to the ordering clinician or representative by the Radiologist Assistant, and communication documented in the PACS or [employer]   IMPRESSION: Infiltrative process centered on the left chest wall, especially at left first costochondral junction, with regional adenopathy , mediastinal fat infiltration, and apical pulmonary opacity. Atypical infection or malignancy are both possible.   MRI 05/2021 FINDINGS: Along the inferomedial margin of the left subclavius muscle for example on image 26 of series 24 and also shown on image 26 of series 18, we demonstrate a somewhat infiltrative 2.7 by 2.2 by 2.6 cm (volume = 8.1 cm^3) process. When I  compare back to prior exams, the examinations of 01/18/2021 showed a substantially greater amount of abnormal enhancement in the retrosternal region, anterior mediastinum, and in the anterior left upper  lobe adjacent to this lesion. By the time of the 02/03/2021 PET-CT, there was new bony destruction of the left anterior first rib but was not present on the earlier 01/27/2021 examinations. On today's exam there is substantial reduction in the overall previously enhancing soft tissue both in the subclavicular and subpectoral region and in the mediastinum, with anterior mediastinal and retrosternal involvement essentially resolved.   Moreover, the previous right level IV adenopathy in the lower neck shown on prior PET-CT has resolved. Please note that today's examination was not a diagnostic CT of the neck but does include the region of the prior enlarged right level IV lymph nodes. Prior AP window lymph node previously 1.1 cm in short axis diameter, currently 0.4 cm in short axis on image 46 series 17.   There is some accentuated enhancement medially in the left subclavian small cell compared to right for example on image 25 of series 24 which may be related to the underlying process.   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   IMPRESSION: 1. Substantial improvement in the abnormal infiltrative process in the left medial infraclavicular region, along the inferior margin of the medial left subclavius muscle. On previous exams this process demonstrated substantial mediastinal, substernal, and anterior left upper lobe involvement on the CT scans from July, and on the PET-CT had progressed to bony destructive findings in the anterior left first rib. On today's examination, the pulmonary, mediastinal, and substernal components have resolved, rib involvement is now questionable, and the effect of this process on the  adjacent internal jugular and subclavian veins is substantially reduced. Moreover, the right level IV adenopathy and mediastinal adenopathy shown on the prior PET-CT has resolved. The remaining enhancing lesion measures up to 2.7 cm in long axis. The substantial improvement without known specific therapy would seem to suggest inflammatory or infectious process, but I do note that the patient did not have leukocytosis back in August. Possibility of an underlying mass or vascular malformation which previously bled causing inflammatory response without substantial leukocytosis is a possibility, although the aggressive findings previously present along the left anterior first rib with tended favor either tumor or infection rather than the sequela of a hematoma. Overall given the substantial improvement, surveillance might be a reasonable option although open biopsy to definitively sample and/or remove the underlying lesion with likewise be a very reasonable choice. MRI chest 08/25/21 FINDINGS: Bones/Joint/Cartilage:   The previously demonstrated process anteriorly in the upper left chest wall between the left clavicular head and anterior aspect of the left 1st rib shows continued improvement. There is mild residual asymmetric soft tissue enhancement in this area which extends around the left sternoclavicular joint. No progressive bone destruction identified. No evidence of soft tissue mass.   Ligaments:   No ligamentous abnormalities identified.   Muscles and Tendons:   The overlying pectoralis musculature appears normal.   Soft tissue:   As above, mild residual soft tissue enhancement in the area of the previously demonstrated abnormality. No fluid collection or soft tissue mass identified. The left subclavian and left internal jugular veins  appear patent.   IMPRESSION: 1. Further improvement in the previously demonstrated process involving the left anterior chest wall  centered at the left 1st costochondral junction. The continued improvement favors a resolving inflammatory/infectious process. No mass lesion identified to suggest neoplasm. 2. This process was better demonstrated by CT, and if additional follow-is warranted clinically, consider neck CT with contrast.   CTs 01/2022 ED: CT Soft Tissue Neck W Contrast   Result Date: 01/31/2022 CLINICAL DATA:  57 year old female with history of a infiltrative process at the left anterior 1st rib, superior mediastinum 1 year ago in July 2022. Biopsied x 2 with pathology NOT revealing malignancy, and subsequent resolution on CT and MRI suggesting an infectious or inflammatory process. Recurrent left neck and shoulder pain for 1.5 weeks. EXAM: CT NECK WITH CONTRAST TECHNIQUE: Multidetector CT imaging of the neck was performed using the standard protocol following the bolus administration of intravenous contrast. RADIATION DOSE REDUCTION: This exam was performed according to the departmental dose-optimization program which includes automated exposure control, adjustment of the mA and/or kV according to patient size and/or use of iterative reconstruction technique. CONTRAST:  51m OMNIPAQUE IOHEXOL 300 MG/ML  SOLN COMPARISON:  Neck CT 09/15/2021, 01/18/2021. Chest CT today reported separately. FINDINGS: Pharynx and larynx: Oropharynx motion artifact today. Pharyngeal and laryngeal soft tissue contours remain stable and within normal limits. Negative visible parapharyngeal and retropharyngeal spaces. Salivary glands: Negative. Motion artifact at the sublingual space which seems to remain negative. Thyroid: Negative. Lymph nodes: Left level 4 lymph nodes at the left thoracic inlet are small and within normal limits on series 4, image 75, stable since March. There is a larger 7-8 mm node at the junction of left level 3 and 4 on series 4, image 60, but also stable since March, smaller from last year, and within normal limits by size  criteria. Other bilateral level 2 and level 3 nodes also are within normal limits and smaller since July of last year. No cystic or necrotic nodes. Vascular: Major vascular structures in the neck and at the skull base including both internal jugular veins are patent. Left greater than right cervical carotid atherosclerosis. Limited intracranial: Mild Calcified atherosclerosis at the skull base. Negative visible brain parenchyma. Visualized orbits: Negative. Mastoids and visualized paranasal sinuses: Visualized paranasal sinuses and mastoids are stable and well aerated. Skeleton: Motion artifact, especially the mandible. Chronic C5-C6 disc and endplate degeneration. No acute or suspicious osseous lesion identified in the neck. Upper chest: Chest CT today is reported separately. IMPRESSION: 1. No acute or inflammatory process identified in the neck. Stable since March and satisfactory CT appearance of the Neck, with resolved lymphadenopathy and left thoracic inlet inflammation since 2022. 2. Chest CT today reported separately. Electronically Signed   By: HGenevie AnnM.D.   On: 01/31/2022 10:04    CT Chest W Contrast   Result Date: 01/31/2022 CLINICAL DATA:  Left neck and shoulder pain for 1.5 weeks. History of tumor. EXAM: CT CHEST WITH CONTRAST TECHNIQUE: Multidetector CT imaging of the chest was performed during intravenous contrast administration. RADIATION DOSE REDUCTION: This exam was performed according to the departmental dose-optimization program which includes automated exposure control, adjustment of the mA and/or kV according to patient size and/or use of iterative reconstruction technique. CONTRAST:  742mOMNIPAQUE IOHEXOL 300 MG/ML  SOLN COMPARISON:  CT chest 09/16/2018 FINDINGS: Cardiovascular: Heart size is normal. There is no pericardial effusion. Mild calcifications of the left anterior descending artery are noted. The remainder of the thoracic vasculature is  unremarkable. Mediastinum/Nodes: The  thyroid is unremarkable. The esophagus is grossly unremarkable. There is no mediastinal, hilar, or axillary lymphadenopathy. Lungs/Pleura: The trachea and central airways are patent. The lungs clear. There is no focal consolidation or pulmonary edema. There is no pleural effusion or pneumothorax. There is upper lobe predominant emphysema, stable. There is a 5 mm nodule in the posteromedial left lower lobe (4-102), unchanged since the study from 08/19/2020 and 01/19/2020. No specific imaging follow-up is required for this nodule. There are no new or enlarging nodules. Upper Abdomen: The imaged portions of the upper abdominal viscera are unremarkable. Musculoskeletal: There is no acute osseous abnormality or suspicious osseous lesion. IMPRESSION: 1. No acute process in the chest or significant change since 09/16/2018. No lymphadenopathy identified. 2. Emphysema (ICD10-J43.9). Electronically Signed   By: Valetta Mole M.D.   On: 01/31/2022 10:01        Assessment & Plan:   Problem List Items Addressed This Visit   None No orders of the defined types were placed in this encounter. Follow Up Instructions: Patient will have follow-up scheduled in January   I discussed the assessment and treatment plan with the patient. The patient was provided an opportunity to ask questions and all were answered. The patient agreed with the plan and demonstrated an understanding of the instructions.   The patient was advised to call back or seek an in-person evaluation if the symptoms worsen or if the condition fails to improve as anticipated.  I provided 21 minutes of non-face-to-face time during this encounter  including  median intraservice time , review of notes, labs, imaging, medications  and explaining diagnosis and management to the patient .    Asencion Noble, MD  Follow-up: No follow-ups on file.

## 2022-07-18 ENCOUNTER — Ambulatory Visit: Payer: Medicaid Other | Admitting: Critical Care Medicine

## 2022-07-18 ENCOUNTER — Encounter: Payer: Self-pay | Admitting: Critical Care Medicine

## 2022-07-19 ENCOUNTER — Other Ambulatory Visit (HOSPITAL_COMMUNITY): Payer: Self-pay

## 2022-07-19 MED ORDER — IBUPROFEN 800 MG PO TABS
800.0000 mg | ORAL_TABLET | Freq: Four times a day (QID) | ORAL | 0 refills | Status: DC | PRN
  Filled 2022-07-19: qty 20, 5d supply, fill #0

## 2022-07-19 MED ORDER — CHLORHEXIDINE GLUCONATE 0.12 % MT SOLN
15.0000 mL | Freq: Two times a day (BID) | OROMUCOSAL | 2 refills | Status: DC
  Filled 2022-07-19: qty 473, 16d supply, fill #0

## 2022-07-19 MED ORDER — OXYCODONE-ACETAMINOPHEN 10-325 MG PO TABS
1.0000 | ORAL_TABLET | Freq: Three times a day (TID) | ORAL | 0 refills | Status: DC | PRN
  Filled 2022-07-19: qty 60, 20d supply, fill #0

## 2022-07-19 MED ORDER — PENICILLIN V POTASSIUM 500 MG PO TABS
500.0000 mg | ORAL_TABLET | Freq: Four times a day (QID) | ORAL | 0 refills | Status: DC
  Filled 2022-07-19: qty 56, 14d supply, fill #0

## 2022-07-19 NOTE — Telephone Encounter (Signed)
Refill pain meds given pending pain clinic appt  Carly this pt needs to be moved up to February from march  PDMP databased checked no duplicate Rx or evidence for drug diversion

## 2022-07-21 ENCOUNTER — Other Ambulatory Visit: Payer: Self-pay | Admitting: Critical Care Medicine

## 2022-07-21 ENCOUNTER — Other Ambulatory Visit: Payer: Self-pay

## 2022-07-21 ENCOUNTER — Other Ambulatory Visit (HOSPITAL_COMMUNITY): Payer: Self-pay

## 2022-07-21 MED ORDER — PRAVASTATIN SODIUM 40 MG PO TABS
40.0000 mg | ORAL_TABLET | Freq: Every day | ORAL | 3 refills | Status: DC
  Filled 2022-07-21: qty 30, 30d supply, fill #0
  Filled 2022-08-22: qty 30, 30d supply, fill #1
  Filled 2022-10-19: qty 30, 30d supply, fill #2
  Filled 2022-12-01: qty 30, 30d supply, fill #3

## 2022-07-21 MED ORDER — VALACYCLOVIR HCL 500 MG PO TABS
500.0000 mg | ORAL_TABLET | Freq: Every day | ORAL | 1 refills | Status: DC
  Filled 2022-07-21: qty 30, 30d supply, fill #0
  Filled 2022-08-22: qty 30, 30d supply, fill #1
  Filled 2022-10-19: qty 30, 30d supply, fill #2
  Filled 2022-12-01: qty 30, 30d supply, fill #3
  Filled 2022-12-05: qty 14, 14d supply, fill #3
  Filled 2022-12-27: qty 14, 14d supply, fill #4
  Filled 2023-01-13: qty 2, 2d supply, fill #5

## 2022-07-21 NOTE — Telephone Encounter (Signed)
Requested Prescriptions  Pending Prescriptions Disp Refills   pravastatin (PRAVACHOL) 40 MG tablet 30 tablet 3    Sig: Take 1 tablet (40 mg total) by mouth daily.     Cardiovascular:  Antilipid - Statins Failed - 07/21/2022 12:12 PM      Failed - Lipid Panel in normal range within the last 12 months    Cholesterol, Total  Date Value Ref Range Status  02/07/2022 207 (H) 100 - 199 mg/dL Final   LDL Chol Calc (NIH)  Date Value Ref Range Status  02/07/2022 119 (H) 0 - 99 mg/dL Final   HDL  Date Value Ref Range Status  02/07/2022 52 >39 mg/dL Final   Triglycerides  Date Value Ref Range Status  02/07/2022 206 (H) 0 - 149 mg/dL Final         Passed - Patient is not pregnant      Passed - Valid encounter within last 12 months    Recent Outpatient Visits           3 months ago Chronic pain syndrome   [place] Elsie Stain, MD   5 months ago Chronic pain syndrome   [place] Elsie Stain, MD   9 months ago [place] Elsie Stain, MD   10 months ago Mass of left chest wall   [place] Elsie Stain, MD   1 year ago COVID-19 virus infection   [place] Ladell Pier, MD       Future Appointments             In 2 months Elsie Stain, MD [place]             valACYclovir (VALTREX) 500 MG tablet 60 tablet 1    Sig: Take 1 tablet (500 mg total) by mouth daily.     Antimicrobials:  Antiviral Agents - Anti-Herpetic Passed - 07/21/2022 12:12 PM      Passed - Valid encounter within last 12 months    Recent Outpatient Visits           3 months ago Chronic pain syndrome   [place], [place]   5 months ago Chronic pain syndrome   [place] Elsie Stain, MD    9 months ago [place] Elsie Stain, MD   10 months ago Mass of left chest wall   [place] Elsie Stain, MD   1 year ago COVID-19 virus infection   [place], [place]       Future Appointments             In 2 months Joya Gaskins Burnett Harry, MD [place]

## 2022-07-26 ENCOUNTER — Encounter: Payer: Self-pay | Admitting: Critical Care Medicine

## 2022-07-26 ENCOUNTER — Other Ambulatory Visit (HOSPITAL_COMMUNITY): Payer: Self-pay

## 2022-07-27 ENCOUNTER — Telehealth: Payer: Self-pay

## 2022-07-27 NOTE — Telephone Encounter (Signed)
Called patient and left vm I rescheduled her appointment as requested

## 2022-07-28 ENCOUNTER — Ambulatory Visit
Admission: RE | Admit: 2022-07-28 | Discharge: 2022-07-28 | Disposition: A | Discharge status: Home / Self Care | Payer: Medicaid Other | Source: Ambulatory Visit | Attending: Orthopedic Surgery | Admitting: Orthopedic Surgery

## 2022-07-28 DIAGNOSIS — S46012A Strain of muscle(s) and tendon(s) of the rotator cuff of left shoulder, initial encounter: Secondary | ICD-10-CM | POA: Diagnosis not present

## 2022-07-28 DIAGNOSIS — M25512 Pain in left shoulder: Secondary | ICD-10-CM | POA: Diagnosis not present

## 2022-07-28 MED ORDER — IOPAMIDOL (ISOVUE-M 200) INJECTION 41%
15.0000 mL | Freq: Once | INTRAMUSCULAR | Status: AC
  Administered 2022-07-28: 15 mL via INTRA_ARTICULAR

## 2022-07-29 NOTE — Progress Notes (Signed)
Hi can you have her f/u thx

## 2022-07-31 ENCOUNTER — Other Ambulatory Visit (HOSPITAL_COMMUNITY): Payer: Self-pay

## 2022-07-31 ENCOUNTER — Encounter: Payer: Self-pay | Admitting: Critical Care Medicine

## 2022-07-31 DIAGNOSIS — F3112 Bipolar disorder, current episode manic without psychotic features, moderate: Secondary | ICD-10-CM | POA: Diagnosis not present

## 2022-07-31 DIAGNOSIS — F4312 Post-traumatic stress disorder, chronic: Secondary | ICD-10-CM | POA: Diagnosis not present

## 2022-07-31 DIAGNOSIS — Z7189 Other specified counseling: Secondary | ICD-10-CM | POA: Diagnosis not present

## 2022-08-01 ENCOUNTER — Telehealth: Payer: Self-pay | Admitting: Orthopedic Surgery

## 2022-08-01 DIAGNOSIS — M75112 Incomplete rotator cuff tear or rupture of left shoulder, not specified as traumatic: Secondary | ICD-10-CM | POA: Diagnosis not present

## 2022-08-01 DIAGNOSIS — M545 Low back pain, unspecified: Secondary | ICD-10-CM | POA: Diagnosis not present

## 2022-08-01 DIAGNOSIS — M25512 Pain in left shoulder: Secondary | ICD-10-CM | POA: Diagnosis not present

## 2022-08-01 DIAGNOSIS — M2550 Pain in unspecified joint: Secondary | ICD-10-CM | POA: Diagnosis not present

## 2022-08-01 DIAGNOSIS — M1990 Unspecified osteoarthritis, unspecified site: Secondary | ICD-10-CM | POA: Diagnosis not present

## 2022-08-01 NOTE — Telephone Encounter (Signed)
Called pt 1X and left vm for pt to call and set an MRI review appt with Dr Marlou Sa after 07/28/22

## 2022-08-02 ENCOUNTER — Other Ambulatory Visit (HOSPITAL_COMMUNITY): Payer: Self-pay

## 2022-08-08 NOTE — Progress Notes (Deleted)
Established Patient Office Visit  Subjective:  Patient ID: Katie Sherman, female    DOB: [birth year]  Age: 57 y.o. MRN: [id number] CC: Chronic pain in the left side of the neck and clavicle  HPI  09/05/21 Katie Sherman presents for primary care to establish and follow-up of chronic condition of the chest and neck. Below are series of the previous documentations I have on this patient when I have seen her 1 time only in July 2022 for pulmonary consultation by her patient's primary care provider Dr. Wynetta Emery.  Subsequent to this the patient was cared for by pulmonary medicine Dr. Lamonte Sakai and thoracic surgery Dr. Kipp Brood and was seen 1 time by infectious disease Dr. Linus Salmons.  01/11/21 This a 57 year old female referred for hemoptysis difficulty swallowing and neck fullness, neck pain and supraclavicular fullness bilaterally of 4 weeks duration.  The patient's primary care provider is Dr. Wynetta Emery.  The patient's hemoptysis is pure blood with clots not mixed with mucus.  She had an episode of minimal hemoptysis a year ago last summer in 2021.  This went away she then developed COVID to beginning portion of 2022 recovered from this.  Patient began having neck swelling and pain 4 weeks ago and presented for evaluations both in the clinic in the emergency room.  Evaluations were not revealing.   At that time she was not having hemoptysis but over the past week she has had increased coughing up of blood combination with progressive swelling in the neck in the supraclavicular areas.  She is also having difficulty with swallowing as well.  Patient does smoke a pack a day of cigarettes and is under a lot of stress unable to reduce her tobacco intake.  She does have some dyspnea on exertion but no wheezing.  She is never been diagnosed with pneumonia.  She did have a CT of the chest in February of this year showing some patchy basilar groundglass changes.  She has also chronic subpleural nodules which have been  unchanged compared to previous exam in 2021 versus February 2022.  The patient's been out of work the last 2 weeks because of the difficulty she is now experiencing.   Patient's past medical history significant for cervical cancer in her self and family history of breast cancer.  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   Note ED visit 01/01/21 Katie Sherman is a 57 y.o. female with past medical history of tobacco use, cervical cancer.  Patient presents to the emergency department with a chief complaint of swelling and tenderness to her neck.  Patient reports that her symptoms started on Wednesday and were located on the left side of her neck.  Patient also endorsed that she was having some difficulty and pain when swallowing liquids but "only on the left side of my throat."  She reports that Thursday morning swelling and pain wear to both sides of her throat.  Reports progressive worsening of symptoms on Friday.  Patient reports that she tried to take your prescribed Acacian Friday morning but it "felt like my throat was closing up.".  States that it felt like one of her pills had become stuck in her throat.  Patient reports that she "stuck a wooden spoon down my throat," in an attempt to push the pill further along.  Patient reports that she had an episode of vomiting after this.     At present patient reports that pain is only located to the right side of her neck.  Patient rates her pain 10/10 on the pain scale.  Patient reports that pain is worse with movement and touch.  Patient reports that she has had some relief with ibuprofen earlier in the week but has not taken any today.    08/26/21 phone note I attempted to call the patient to inform her about her MRI results. Note there was a  phone virtual visit with Dr Kipp Brood who spoke to the patient around 2pm today but due to high ED volume the MRI had yet to read at that point. I have received two very long MyChart messages from the patient this afternoon expressing her frustrations        I spoke to Dr Camie Patience, Chief of Radiology [facility] on the results  He and I reviewed all prior imaging to date.   The process appears to have started behind the sternoclavicular joint and first rib at the cartilaginous junction posteriorly.  It has significantly regressed and all the reactive mediastinal cervical and axillary adenopathy has disappeared   There is no cervical spine pathology.  The interna Jugular is patent .  As this was a combined MRI MRA study all great vessels to and from the heart are NORMAL   This does NOT represent Cancer    DR Lin Landsman had an interesting hypothesis, what if this were from some type of costochondritis that resulted in cartilaginous fracture and bleeding in the past of summer 2022.   The pt steadfastly denies trauma or IVDU .      In any case it is not cancer but likely inflammatory.   I have tried to call the patient multiple times.   She is living with a friend off [address].     I have left two phone msg's on the patients cell  there is no answer,  I have tried to call at least 8 times between 4p and now to no avail   I am copying this phone note with Drs Lamonte Sakai and Kipp Brood.  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  She is well aware it at about the current MRI as she had a visit with Dr. Kipp Brood over the phone he explained the current findings to her.  She was not available when I tried to call her 2 weeks ago.  She agrees today to become primary care under my coverage as well as follow-up here for her pulmonary status.  She states she is not having swallowing difficulty as she was before but has a  burning sensation on the back of her neck she has bad dentition.  She feels a squeezing sensation in the chest neck area and pain in the left clavicle.  She also notes a slight increase swelling in the right side of the neck as well.  She has left-sided shoulder pain as well.  She states she did fall early in the spring down her stairs and tried to grab with her left hand she does not recall specifically injuring her shoulder at that visit.  Patient subsequent developed acute onset of the symptoms in the neck in June 2022.  She then saw her primary care provider who then referred her to me.  See my July 2022 note.  At that point we ordered imaging of the neck with a CT scan.  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  And other findings of mass effect behind the left clavicle and also involvement of the tip of the first rib on the left and mediastinal adenopathy and mass effect under the chest wall.  Serial imagings over a period of time including PET scan and 2 other MRIs showed the mass lesion is reducing in size and the most recent MRI in February of this year showed marked reduction of all areas of involvement.  Patient denies any infectious disease history she denies any IV drug use.  No history of tuberculosis.  No fever chills or sweats.  Her weight is down somewhat.  She has not had any systemic blood draws.  Patient denies any further hemoptysis this only occurred in the summer 2022.  She is requesting a refill on a nitro drip patch which helped with bursitis of the right hip which she got from orthopedics in 2019.  She also wants her acyclovir refilled pill.  She would like refills on her chronic pain medicine.  She convened today with me today to see if any additional imaging studies can be performed and other evaluations be performed for this patient.  10/03/21   Patient seen in return visit and states she still having some posterior neck pain but the shoulder pain and  sternoclavicular pain has nearly resolved.  She has complained of a new problem and that she cracked a molar in her jaw and has been taking some of the oxycodone for the jaw pain.  She states the indomethacin has helped to some degree but is causing GI upset.  Patient is in need of a mammogram and colon cancer screening and agrees to receive these.  On arrival blood pressure is good 117/81.  The patient still smokes a pack a day of cigarettes.  She also needs a Pap smear.  She has upcoming in April and May Social Security determination assessments for disability with orthopedics and psychiatry.  8/1 This is a return visit for this 57 year old female with chronic pain in the left shoulder left clavicular area and left neck.  She went to the emergency room on 25 July with complaints of progressive pain.  CT imaging of the neck and chest were unremarkable for swelling or other abnormalities.  All previous abnormalities from 2020 to have now resolved.  Patient yet is still requesting more Percocets.  She needs to sign a chronic pain management pain control contract at this time since she is still requiring Percocets.  Patient now is completely homeless living in her car for the past 2 months this is exacerbated some of her chronic pain in her hip neck and back as she has to sleep in the car.  On arrival blood pressure 114/78.  Patient does admit to using cannabis. The patient does not need Pap smears she has had a complete cold knife conization and negative Pap smear in 2018 was told did not need further.  Patient has been seen by orthopedics in the past may yet need another referral.  Patient remains without any insurance at this time.  She is failed to achieve disability on several attempts.  10/3 Patient seen by way of a phone visit follow-up from August 1.  She is still living in her car and stays at a friend's home is able to go in and shower wash close and occasionally take a nap  in large lives in her  [place].  She is having car trouble today could not make it to the appointment.  Pain is mostly in the shoulder.  She is able to get by by taking 1 Percocet daily.  She just had a 50 count given on 20 September.  She has a pending pain management appointment in [place].  She also needs dental appointment upcoming for tooth extraction.  She is followed up with her mental health doctor who is represcribed her medications.  She cannot afford her co-pay at this time and is trying to place these on an account.  She has no other complaints at this visit.   08/08/22  Past Medical History:  Diagnosis Date   Bilateral carpal tunnel syndrome 03/19/2018   Cervical cancer (HCC)    Cervical cancer ([place])    Hemoptysis 01/11/2021   History of exercise stress test    ETT-Echo 5/18: normal EF, no ischemia   History of loop recorder    currently on person   Kidney stone    Pharyngeal dysphagia 01/11/2021    Past Surgical History:  Procedure Laterality Date   CERVICAL CONE BIOPSY     TUBAL LIGATION      Family History  Problem Relation Age of Onset   Heart attack Mother 53   Heart disease Father    Heart attack Father 19   Liver cancer Father    Bone cancer Other    Breast cancer Maternal Aunt        Diagnosed in 54's   Bone cancer Maternal Aunt     Social History   Socioeconomic History   Marital status: Widowed    Spouse name: Not on file   Number of children: Not on file   Years of education: Not on file   Highest education level: Not on file  Occupational History   Occupation: unemployed  Tobacco Use   Smoking status: Every Day    Packs/day: 0.50    Years: 36.00    Total pack years: 18.00    Types: Cigarettes    Start date: 30   Smokeless tobacco: Never   Tobacco comments:    Smokes 1 pack a day ARJ 01/20/21  Vaping Use   Vaping Use: Never used  Substance and Sexual Activity   Alcohol use: No   Drug use: Yes    Types: Marijuana    Comment: stopped 04/06/2017   Sexual  activity: Not Currently    Birth control/protection: None  Other Topics Concern   Not on file  Social History Narrative   Prior traffic control specialist - unemployed now   Widow   5 children, 10 grandchildren   Moved here from [place] 2013   Social Determinants of Health   Financial Resource Strain: Not on file  Food Insecurity: Not on file  Transportation Needs: Not on file  Physical Activity: Not on file  Stress: Not on file  Social Connections: Not on file  Intimate Partner Violence: Not on file    Outpatient Medications Prior to Visit  Medication Sig Dispense Refill   albuterol (VENTOLIN HFA) 108 (90 Base) MCG/ACT inhaler Inhale 2 puffs into the lungs every 6 (six) hours as needed for wheezing or shortness of breath. 6.7 g 2   amoxicillin (AMOXIL) 500 MG capsule Take 1 capsule (500 mg total) by mouth every 8 (eight) hours, for 7 days. 21 capsule 0   chlorhexidine (PERIDEX) 0.12 % solution Rinse mouth with 15 mLs (1  capful) for 30 seconds in the morning and in the evening after toothbrushing. then SPIT after rinsing, DO NOT SWALLOW 473 mL 2   cyclobenzaprine (FLEXERIL) 10 MG tablet Take 1 tablet (10 mg total) by mouth 3 (three) times daily as needed for muscle spasms. 30 tablet 2   ibuprofen (ADVIL) 600 MG tablet Take 1 tablet (600 mg total) by mouth every 8 (eight) hours as needed. 90 tablet 2   ibuprofen (IBU) 800 MG tablet Take 1 tablet (800 mg total) by mouth every 6-8 hours as needed. 20 tablet 0   lamoTRIgine (LAMICTAL) 100 MG tablet Take 3 tablets (300 mg total) by mouth daily. 90 tablet 2   naloxone (NARCAN) nasal spray 4 mg/0.1 mL Use in case of accidental overdose 2 each 0   nitroGLYCERIN (NITRODUR - DOSED IN MG/24 HR) 0.2 mg/hr patch Apply 1/4 patch daily to the affected area 30 patch 1   omeprazole (PRILOSEC) 20 MG capsule Take 1 capsule (20 mg total) by mouth daily. 30 capsule 0   oxyCODONE-acetaminophen (PERCOCET) 10-325 MG tablet Take 1 tablet by mouth every 8  (eight) hours as needed for pain. 60 tablet 0   penicillin v potassium (VEETID) 500 MG tablet Take 1 tablet (500 mg total) by mouth 4 (four) times daily unitl gone 56 tablet 0   pravastatin (PRAVACHOL) 40 MG tablet Take 1 tablet (40 mg total) by mouth daily. 30 tablet 3   QUEtiapine (SEROQUEL) 200 MG tablet Take 1 tablet (200 mg total) by mouth at bedtime. 30 tablet 2   traZODone (DESYREL) 50 MG tablet Take 1 tablet (50 mg total) by mouth at bedtime as needed for sleep. 30 tablet 2   valACYclovir (VALTREX) 500 MG tablet Take 1 tablet (500 mg total) by mouth daily. 60 tablet 1   Vitamin D, Ergocalciferol, (DRISDOL) 1.25 MG (50000 UNIT) CAPS capsule Take 1 capsule (50,000 Units total) by mouth every 7 (seven) days. 5 capsule 5   No facility-administered medications prior to visit.    No Known Allergies  ROS Review of Systems  Constitutional:  Negative for chills, diaphoresis, fatigue, fever and unexpected weight change.  HENT:  Negative for dental problem, ear pain, postnasal drip, rhinorrhea, sinus pressure, sore throat, trouble swallowing and voice change.   Eyes: Negative.   Respiratory: Negative.  Negative for apnea, cough, choking, chest tightness, shortness of breath, wheezing and stridor.        No further hemoptysis  Cardiovascular:  Negative for chest pain, palpitations and leg swelling.  Gastrointestinal: Negative.  Negative for abdominal distention, abdominal pain, nausea and vomiting.  Genitourinary: Negative.   Musculoskeletal:  Positive for neck pain and neck stiffness. Negative for arthralgias and myalgias.       Left acromioclavicular joint pain  Skin: Negative.  Negative for rash.  Allergic/Immunologic: Negative.  Negative for environmental allergies and food allergies.  Neurological: Negative.  Negative for dizziness, syncope, weakness and headaches.  Hematological: Negative.  Negative for adenopathy. Does not bruise/bleed easily.  Psychiatric/Behavioral:  Negative for  agitation, dysphoric mood and sleep disturbance. The patient is not nervous/anxious.       Objective:    Phone visit no exam  LMP 04/29/2012  Wt Readings from Last 3 Encounters:  03/08/22 180 lb (81.6 kg)  02/07/22 183 lb (83 kg)  01/30/22 169 lb 15.6 oz (77.1 kg)     Health Maintenance Due  Topic Date Due   COLON CANCER SCREENING ANNUAL FOBT  Never done   INFLUENZA VACCINE  Never done    There are no preventive care reminders to display for this patient.  Lab Results  Component Value Date   TSH 2.640 09/05/2021   Lab Results  Component Value Date   WBC 6.8 01/31/2022   HGB 12.5 01/31/2022   HCT 37.2 01/31/2022   MCV 94.4 01/31/2022   PLT 339 01/31/2022   Lab Results  Component Value Date   NA 139 02/07/2022   K 4.3 02/07/2022   CO2 23 02/07/2022   GLUCOSE 107 (H) 02/07/2022   BUN 10 02/07/2022   CREATININE 1.08 (H) 02/07/2022   BILITOT 0.2 02/07/2022   ALKPHOS 129 (H) 02/07/2022   AST 11 02/07/2022   ALT 10 02/07/2022   PROT 6.7 02/07/2022   ALBUMIN 4.5 02/07/2022   CALCIUM 9.1 02/07/2022   ANIONGAP 6 01/31/2022   EGFR 61 02/07/2022   Lab Results  Component Value Date   CHOL 207 (H) 02/07/2022   Lab Results  Component Value Date   HDL 52 02/07/2022   Lab Results  Component Value Date   LDLCALC 119 (H) 02/07/2022   Lab Results  Component Value Date   TRIG 206 (H) 02/07/2022   Lab Results  Component Value Date   CHOLHDL 4.0 02/07/2022   Lab Results  Component Value Date   HGBA1C 5.7 (H) 02/07/2022  CT Neck 01/19/21 EXAM: CT NECK WITH CONTRAST   TECHNIQUE: Multidetector CT imaging of the neck was performed using the standard protocol following the bolus administration of intravenous contrast.   CONTRAST:  185m OMNIPAQUE IOHEXOL 350 MG/ML SOLN   COMPARISON:  08/19/2020   FINDINGS: Pharynx and larynx: No evidence of primary mass or inflammation along the mucosa   Salivary glands: No inflammation, mass, or stone.   Thyroid:  Normal.   Lymph nodes: Bilateral jugular chain adenopathy. A right level 4 node measures 19 mm maximum on axial images. There is an infiltrative process at the left thoracic inlet and upper mediastinum, with sheet like soft tissue thickening posterior to the jugular notch and manubrium. Just anterior to the left IJ brachiocephalic confluence is a cystic appearing component measuring 12 mm. Soft tissue density extends anterior to the left first costochondral junction. There is reticulation and adjacent left upper lobe with partially covered layering left pleural effusion.   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   Limited intracranial: Negative   Visualized orbits: Negative   Mastoids and visualized paranasal sinuses: Clear   Skeleton: No erosion of ribs or joint spaces adjacent to the inflammation, although the left first costochondral junction is wider than the right.   Upper chest: Reticular opacity in the subpleural left upper lobe adjacent to the chest wall process.   These results will be called to the ordering clinician or representative by the Radiologist Assistant, and communication documented in the PACS or [employer]   IMPRESSION: Infiltrative process centered on the left chest wall, especially at left first costochondral junction, with regional adenopathy , mediastinal fat infiltration, and apical pulmonary opacity. Atypical infection or malignancy are both possible.   MRI 05/2021 FINDINGS: Along the inferomedial margin of the left subclavius muscle for example on image 26 of series 24 and also shown on image 26 of series 18, we demonstrate a somewhat infiltrative 2.7 by 2.2 by 2.6 cm (volume = 8.1 cm^3) process. When I compare back to prior exams, the examinations of 01/18/2021 showed a substantially greater amount of abnormal enhancement in the retrosternal  region, anterior mediastinum, and in the anterior  left upper lobe adjacent to this lesion. By the time of the 02/03/2021 PET-CT, there was new bony destruction of the left anterior first rib but was not present on the earlier 01/27/2021 examinations. On today's exam there is substantial reduction in the overall previously enhancing soft tissue both in the subclavicular and subpectoral region and in the mediastinum, with anterior mediastinal and retrosternal involvement essentially resolved.   Moreover, the previous right level IV adenopathy in the lower neck shown on prior PET-CT has resolved. Please note that today's examination was not a diagnostic CT of the neck but does include the region of the prior enlarged right level IV lymph nodes. Prior AP window lymph node previously 1.1 cm in short axis diameter, currently 0.4 cm in short axis on image 46 series 17.   There is some accentuated enhancement medially in the left subclavian small cell compared to right for example on image 25 of series 24 which may be related to the underlying process.   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   IMPRESSION: 1. Substantial improvement in the abnormal infiltrative process in the left medial infraclavicular region, along the inferior margin of the medial left subclavius muscle. On previous exams this process demonstrated substantial mediastinal, substernal, and anterior left upper lobe involvement on the CT scans from July, and on the PET-CT had progressed to bony destructive findings in the anterior left first rib. On today's examination, the pulmonary, mediastinal, and substernal components have resolved, rib involvement is now questionable, and the effect of this process on the  adjacent internal jugular and subclavian veins is substantially reduced. Moreover, the right level IV adenopathy and mediastinal adenopathy shown on the prior PET-CT has resolved. The remaining enhancing lesion measures up to 2.7 cm in long axis. The substantial improvement without known specific therapy would seem to suggest inflammatory or infectious process, but I do note that the patient did not have leukocytosis back in August. Possibility of an underlying mass or vascular malformation which previously bled causing inflammatory response without substantial leukocytosis is a possibility, although the aggressive findings previously present along the left anterior first rib with tended favor either tumor or infection rather than the sequela of a hematoma. Overall given the substantial improvement, surveillance might be a reasonable option although open biopsy to definitively sample and/or remove the underlying lesion with likewise be a very reasonable choice. MRI chest 08/25/21 FINDINGS: Bones/Joint/Cartilage:   The previously demonstrated process anteriorly in the upper left chest wall between the left clavicular head and anterior aspect of the left 1st rib shows continued improvement. There is mild residual asymmetric soft tissue enhancement in this area which extends around the left sternoclavicular joint. No progressive bone destruction identified. No evidence of soft tissue mass.   Ligaments:   No ligamentous abnormalities identified.   Muscles and Tendons:   The overlying pectoralis musculature appears normal.   Soft tissue:   As above, mild residual soft tissue enhancement in the area of the previously demonstrated abnormality. No fluid collection or soft tissue mass identified. The left subclavian and left internal jugular veins appear patent.   IMPRESSION: 1. Further improvement in the previously demonstrated process involving the left anterior chest wall  centered at the left 1st costochondral junction. The continued improvement favors a resolving inflammatory/infectious process. No mass lesion identified to suggest neoplasm. 2. This process was better demonstrated by CT, and if additional follow-is warranted clinically, consider neck CT with contrast.   CTs 01/2022 ED: CT Soft Tissue Neck W Contrast   Result Date: 01/31/2022 CLINICAL DATA:  57 year old female with history of a infiltrative process at the left anterior 1st rib, superior mediastinum 1 year ago in July 2022. Biopsied x 2 with pathology NOT revealing malignancy, and subsequent resolution on CT and MRI suggesting an infectious or inflammatory process. Recurrent left neck and shoulder pain for 1.5 weeks. EXAM: CT NECK WITH CONTRAST TECHNIQUE: Multidetector CT imaging of the neck was performed using the standard protocol following the bolus administration of intravenous contrast. RADIATION DOSE REDUCTION: This exam was performed according to the departmental dose-optimization program which includes automated exposure control, adjustment of the mA and/or kV according to patient size and/or use of iterative reconstruction technique. CONTRAST:  62m OMNIPAQUE IOHEXOL 300 MG/ML  SOLN COMPARISON:  Neck CT 09/15/2021, 01/18/2021. Chest CT today reported separately. FINDINGS: Pharynx and larynx: Oropharynx motion artifact today. Pharyngeal and laryngeal soft tissue contours remain stable and within normal limits. Negative visible parapharyngeal and retropharyngeal spaces. Salivary glands: Negative. Motion artifact at the sublingual space which seems to remain negative. Thyroid: Negative. Lymph nodes: Left level 4 lymph nodes at the left thoracic inlet are small and within normal limits on series 4, image 75, stable since March. There is a larger 7-8 mm node at the junction of left level 3 and 4 on series 4, image 60, but also stable since March, smaller from last year, and within normal limits by size  criteria. Other bilateral level 2 and level 3 nodes also are within normal limits and smaller since July of last year. No cystic or necrotic nodes. Vascular: Major vascular structures in the neck and at the skull base including both internal jugular veins are patent. Left greater than right cervical carotid atherosclerosis. Limited intracranial: Mild Calcified atherosclerosis at the skull base. Negative visible brain parenchyma. Visualized orbits: Negative. Mastoids and visualized paranasal sinuses: Visualized paranasal sinuses and mastoids are stable and well aerated. Skeleton: Motion artifact, especially the mandible. Chronic C5-C6 disc and endplate degeneration. No acute or suspicious osseous lesion identified in the neck. Upper chest: Chest CT today is reported separately. IMPRESSION: 1. No acute or inflammatory process identified in the neck. Stable since March and satisfactory CT appearance of the Neck, with resolved lymphadenopathy and left thoracic inlet inflammation since 2022. 2. Chest CT today reported separately. Electronically Signed   By: HGenevie AnnM.D.   On: 01/31/2022 10:04    CT Chest W Contrast   Result Date: 01/31/2022 CLINICAL DATA:  Left neck and shoulder pain for 1.5 weeks. History of tumor. EXAM: CT CHEST WITH CONTRAST TECHNIQUE: Multidetector CT imaging of the chest was performed during intravenous contrast administration. RADIATION DOSE REDUCTION: This exam was performed according to the departmental dose-optimization program which includes automated exposure control, adjustment of the mA and/or kV according to patient size and/or use of iterative reconstruction technique. CONTRAST:  756mOMNIPAQUE IOHEXOL 300 MG/ML  SOLN COMPARISON:  CT chest 09/16/2018 FINDINGS: Cardiovascular: Heart size is normal. There is no pericardial effusion. Mild calcifications of the left anterior descending artery are noted. The remainder of the thoracic vasculature is unremarkable. Mediastinum/Nodes: The  thyroid is unremarkable. The esophagus is grossly unremarkable. There is no mediastinal, hilar, or axillary lymphadenopathy.  Lungs/Pleura: The trachea and central airways are patent. The lungs clear. There is no focal consolidation or pulmonary edema. There is no pleural effusion or pneumothorax. There is upper lobe predominant emphysema, stable. There is a 5 mm nodule in the posteromedial left lower lobe (4-102), unchanged since the study from 08/19/2020 and 01/19/2020. No specific imaging follow-up is required for this nodule. There are no new or enlarging nodules. Upper Abdomen: The imaged portions of the upper abdominal viscera are unremarkable. Musculoskeletal: There is no acute osseous abnormality or suspicious osseous lesion. IMPRESSION: 1. No acute process in the chest or significant change since 09/16/2018. No lymphadenopathy identified. 2. Emphysema (ICD10-J43.9). Electronically Signed   By: Valetta Mole M.D.   On: 01/31/2022 10:01        Assessment & Plan:   Problem List Items Addressed This Visit   None No orders of the defined types were placed in this encounter. Follow Up Instructions: Patient will have follow-up scheduled in January   I discussed the assessment and treatment plan with the patient. The patient was provided an opportunity to ask questions and all were answered. The patient agreed with the plan and demonstrated an understanding of the instructions.   The patient was advised to call back or seek an in-person evaluation if the symptoms worsen or if the condition fails to improve as anticipated.  I provided 21 minutes of non-face-to-face time during this encounter  including  median intraservice time , review of notes, labs, imaging, medications  and explaining diagnosis and management to the patient .    Asencion Noble, MD  Follow-up: No follow-ups on file.

## 2022-08-09 ENCOUNTER — Ambulatory Visit: Payer: Medicaid Other | Admitting: Critical Care Medicine

## 2022-08-09 ENCOUNTER — Encounter: Payer: Self-pay | Admitting: Critical Care Medicine

## 2022-08-09 NOTE — Telephone Encounter (Signed)
Missed appointment today needs an afternoon appointment please schedule sometime in the near future on a Tuesday afternoon okay to double

## 2022-08-14 ENCOUNTER — Other Ambulatory Visit: Payer: Self-pay

## 2022-08-14 ENCOUNTER — Other Ambulatory Visit: Payer: Self-pay | Admitting: Critical Care Medicine

## 2022-08-14 DIAGNOSIS — K219 Gastro-esophageal reflux disease without esophagitis: Secondary | ICD-10-CM

## 2022-08-15 ENCOUNTER — Other Ambulatory Visit (HOSPITAL_COMMUNITY): Payer: Self-pay

## 2022-08-15 ENCOUNTER — Telehealth: Payer: Self-pay

## 2022-08-15 ENCOUNTER — Ambulatory Visit: Payer: Medicaid Other | Admitting: Critical Care Medicine

## 2022-08-15 MED ORDER — OMEPRAZOLE 20 MG PO CPDR
20.0000 mg | DELAYED_RELEASE_CAPSULE | Freq: Every day | ORAL | 0 refills | Status: DC
  Filled 2022-08-15: qty 90, 90d supply, fill #0

## 2022-08-15 NOTE — Telephone Encounter (Signed)
Requested Prescriptions  Pending Prescriptions Disp Refills   omeprazole (PRILOSEC) 20 MG capsule 90 capsule 0    Sig: Take 1 capsule (20 mg total) by mouth daily.     Gastroenterology: Proton Pump Inhibitors Passed - 08/14/2022  5:58 AM      Passed - Valid encounter within last 12 months    Recent Outpatient Visits           4 months ago Chronic pain syndrome   [place] Elsie Stain, MD   6 months ago Chronic pain syndrome   [place], MD   10 months ago [place] Elsie Stain, MD   11 months ago Mass of left chest wall   [place] Elsie Stain, MD   1 year ago COVID-19 virus infection   [place], MD       Future Appointments             In 3 days Marlou Sa, Tonna [place], MD [facility]

## 2022-08-15 NOTE — Telephone Encounter (Signed)
Called patient and left vm to scheduled a appointment with wright

## 2022-08-18 ENCOUNTER — Ambulatory Visit (INDEPENDENT_AMBULATORY_CARE_PROVIDER_SITE_OTHER): Payer: Medicaid Other | Admitting: Surgical

## 2022-08-18 ENCOUNTER — Encounter: Payer: Self-pay | Admitting: Critical Care Medicine

## 2022-08-18 DIAGNOSIS — M75112 Incomplete rotator cuff tear or rupture of left shoulder, not specified as traumatic: Secondary | ICD-10-CM

## 2022-08-18 NOTE — Telephone Encounter (Signed)
I asked for a PM Tuesday appt for this patient last week no response yet from front office  jane can you help me out  getting a lot of mychart msg from her

## 2022-08-19 ENCOUNTER — Encounter: Payer: Self-pay | Admitting: Orthopedic Surgery

## 2022-08-19 NOTE — Progress Notes (Signed)
Office Visit Note   Patient: Katie Sherman           Date of Birth: 1966/05/22           MRN: [id number] Visit Date: 08/18/2022 Requested by: Elsie Stain, MD [address] [place],  [place] [address] PCP: Elsie Stain, MD  Subjective: Chief Complaint  Patient presents with   Other     Scan review    HPI: Katie Sherman is a 57 y.o. female who presents to the office for MRI review. Patient denies any changes in symptoms.  Continues to complain mainly of left shoulder pain.  She has pain since 2018 with history of frozen shoulder.  She has had prior injections with the most recent injection around 2019 or 2020 with good relief.  She states that she wakes with pain from the shoulder about 2-3 times a week.  Localizes most of her pain to the lateral aspect of the shoulder with occasional radiation to the mid humeral region.  She takes Percocet 10 mg every 6 hours as needed.  No history of prior surgery to the shoulder.  MRI results revealed: MR Shoulder Left w/ contrast  Result Date: 07/29/2022 CLINICAL DATA:  Left shoulder pain and weakness for a couple years. Worse since 2022. No known injury. EXAM: MRI OF THE LEFT SHOULDER WITH CONTRAST TECHNIQUE: Multiplanar, multisequence MR imaging of the shoulder was performed following the administration of intra-articular contrast. CONTRAST:  See Injection Documentation. COMPARISON:  None Available. FINDINGS: Rotator cuff: Severe tendinosis of the supraspinatus tendon with a high-grade partial-thickness articular surface tear. Mild tendinosis of the infraspinatus tendon. Teres minor tendon is intact. Subscapularis tendon is intact. Muscles: No muscle atrophy or edema. No intramuscular fluid collection or hematoma. Biceps Long Head: Intraarticular and extraarticular portions of the biceps tendon are intact. Acromioclavicular Joint: Mild arthropathy of the acromioclavicular joint. No subacromial/subdeltoid bursal fluid.  Glenohumeral Joint: Intraarticular contrast distending the joint capsule. Normal glenohumeral ligaments. Mild partial-thickness cartilage loss of the glenohumeral joint. Labrum: Posterosuperior labral degeneration.  No discrete tear. Bones: No fracture or dislocation. No aggressive osseous lesion. Other: No fluid collection or hematoma. IMPRESSION: 1. Severe tendinosis of the supraspinatus tendon with a high-grade partial-thickness articular surface tear. 2. Mild tendinosis of the infraspinatus tendon. 3. Mild partial-thickness cartilage loss of the glenohumeral joint. Electronically Signed   By: Kathreen Devoid M.D.   On: 07/29/2022 06:51                 ROS: All systems reviewed are negative as they relate to the chief complaint within the history of present illness.  Patient denies fevers or chills.  Assessment & Plan: Visit Diagnoses:  1. Incomplete rotator cuff tear or rupture of left shoulder, not specified as traumatic     Plan: Katie Sherman is a 57 y.o. female who presents to the office for review of left shoulder MRI.  MRI demonstrates significant tendinosis of the supraspinatus with articular surface tear.  There is a high-grade partial-thickness tear that encompasses a fairly significant amount of the tendon but it is not full-thickness.  No significant bicep tendon pathology and she has a little bit of partial-thickness cartilage loss of the glenohumeral joint.  Went over these images with her today and discussed options available to patient which mainly would be physical therapy versus ultrasound-guided glenohumeral injection versus surgical intervention in the form of rotator cuff repair with bicep tenodesis.  After discussion of options including  what to expect from surgery as well as the recovery timeframe, she would like to think over her options and she will contact the office to discuss further steps.  Follow-Up Instructions: No follow-ups on file.   Orders:  No orders of the  defined types were placed in this encounter.  No orders of the defined types were placed in this encounter.     Procedures: No procedures performed   Clinical Data: No additional findings.  Objective: Vital Signs: LMP 04/29/2012   Physical Exam:  Constitutional: Patient appears well-developed HEENT:  Head: Normocephalic Eyes:EOM are normal Neck: Normal range of motion Cardiovascular: Normal rate Pulmonary/chest: Effort normal Neurologic: Patient is alert Skin: Skin is warm Psychiatric: Patient has normal mood and affect  Ortho Exam: Ortho exam demonstrates left shoulder with 60 degrees X rotation, 90 degrees abduction, 150 degrees forward elevation.  This compared with the right shoulder with 80 degrees X rotation, 90 degrees abduction, 160 degrees forward elevation.  She has positive O'Brien sign with tenderness to palpation of the bicipital groove.  She has minimal tenderness over the [facility] joint.  Good rotator cuff strength of subscapularis rated 5/5.  5 -/5 infraspinatus strength and 4+/5 supraspinatus strength.  This reproduces her pain.  Negative Spurling sign.  Negative Lhermitte sign.  5/5 grip strength, finger abduction, pronation, supination, bicep flexion, tricep extension, axillary nerve intact with deltoid firing.  Specialty Comments:  No specialty comments available.  Imaging: No results found.   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   Postmenopausal 01/22/2017   Bipolar 1 disorder, depressed ([place]) 12/29/2016   Family history of suicide 11/20/2016   Depression 11/20/2016   Family history of breast cancer 11/20/2016    Adhesive capsulitis of left shoulder 11/19/2016   Greater trochanteric bursitis of right hip 11/19/2016   Past Medical History:  Diagnosis Date   Bilateral carpal tunnel syndrome 03/19/2018   Cervical cancer ([place])    Cervical cancer ([place])    Hemoptysis 01/11/2021   History of exercise stress test    ETT-Echo 5/18: normal EF, no ischemia   History of loop recorder    currently on person   Kidney stone    Pharyngeal dysphagia 01/11/2021    Family History  Problem Relation Age of Onset   Heart attack Mother 59   Heart disease Father    Heart attack Father 18   Liver cancer Father    Bone cancer Other    Breast cancer Maternal Aunt        Diagnosed in 52's   Bone cancer Maternal Aunt     Past Surgical History:  Procedure Laterality Date   CERVICAL CONE BIOPSY     TUBAL LIGATION     Social History   Occupational History   Occupation: unemployed  Tobacco Use   Smoking status: Every Day    Packs/day: 0.50    Years: 36.00    Total pack years: 18.00    Types: Cigarettes    Start date: 1983   Smokeless tobacco: Never   Tobacco comments:    Smokes 1 pack a day ARJ 01/20/21  Vaping Use   Vaping Use: Never used  Substance and  Sexual Activity   Alcohol use: No   Drug use: Yes    Types: Marijuana    Comment: stopped 04/06/2017   Sexual activity: Not Currently    Birth control/protection: None

## 2022-08-22 ENCOUNTER — Ambulatory Visit: Payer: Medicaid Other | Admitting: Critical Care Medicine

## 2022-08-22 NOTE — Progress Notes (Deleted)
Established Patient Office Visit  Subjective:  Patient ID: Katie Sherman, female    DOB: 1965-08-18  Age: 57 y.o. MRN: [id number]  History of Present Illness:    CC: Chronic pain in the left side of the neck and clavicle  HPI  09/05/21 Katie Sherman presents for primary care to establish and follow-up of chronic condition of the chest and neck. Below are series of the previous documentations I have on this patient when I have seen her 1 time only in July 2022 for pulmonary consultation by her patient's primary care provider Dr. Wynetta Emery.  Subsequent to this the patient was cared for by pulmonary medicine Dr. Lamonte Sakai and thoracic surgery Dr. Kipp Brood and was seen 1 time by infectious disease Dr. Linus Salmons.  01/11/21 This a 57 year old female referred for hemoptysis difficulty swallowing and neck fullness, neck pain and supraclavicular fullness bilaterally of 4 weeks duration.  The patient's primary care provider is Dr. Wynetta Emery.  The patient's hemoptysis is pure blood with clots not mixed with mucus.  She had an episode of minimal hemoptysis a year ago last summer in 2021.  This went away she then developed COVID to beginning portion of 2022 recovered from this.  Patient began having neck swelling and pain 4 weeks ago and presented for evaluations both in the clinic in the emergency room.  Evaluations were not revealing.   At that time she was not having hemoptysis but over the past week she has had increased coughing up of blood combination with progressive swelling in the neck in the supraclavicular areas.  She is also having difficulty with swallowing as well.  Patient does smoke a pack a day of cigarettes and is under a lot of stress unable to reduce her tobacco intake.  She does have some dyspnea on exertion but no wheezing.  She is never been diagnosed with pneumonia.  She did have a CT of the chest in February of this year showing some patchy basilar groundglass changes.  She has also chronic  subpleural nodules which have been unchanged compared to previous exam in 2021 versus February 2022.  The patient's been out of work the last 2 weeks because of the difficulty she is now experiencing.   Patient's past medical history significant for cervical cancer in her self and family history of breast cancer.  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   Note ED visit 01/01/21 Katie Sherman is a 57 y.o. female with past medical history of tobacco use, cervical cancer.  Patient presents to the emergency department with a chief complaint of swelling and tenderness to her neck.  Patient reports that her symptoms started on Wednesday and were located on the left side of her neck.  Patient also endorsed that she was having some difficulty and pain when swallowing liquids but "only on the left side of my throat."  She reports that Thursday morning swelling and pain wear to both sides of her throat.  Reports progressive worsening of symptoms on Friday.  Patient reports that she tried to take your prescribed Acacian Friday morning but it "felt like my throat was closing up.".  States that it felt like one of her pills had become stuck in her throat.  Patient reports that she "stuck a wooden spoon down my throat," in  an attempt to push the pill further along.  Patient reports that she had an episode of vomiting after this.     At present patient reports that pain is only located to the right side of her neck.  Patient rates her pain 10/10 on the pain scale.  Patient reports that pain is worse with movement and touch.  Patient reports that she has had some relief with ibuprofen earlier in the week but has not taken any today.    08/26/21 phone note I attempted to call the patient to inform her about  her MRI results. Note there was a phone virtual visit with Dr Kipp Brood who spoke to the patient around 2pm today but due to high ED volume the MRI had yet to read at that point. I have received two very long MyChart messages from the patient this afternoon expressing her frustrations        I spoke to Dr Camie Patience, Chief of Radiology [facility] on the results  He and I reviewed all prior imaging to date.   The process appears to have started behind the sternoclavicular joint and first rib at the cartilaginous junction posteriorly.  It has significantly regressed and all the reactive mediastinal cervical and axillary adenopathy has disappeared   There is no cervical spine pathology.  The interna Jugular is patent .  As this was a combined MRI MRA study all great vessels to and from the heart are NORMAL   This does NOT represent Cancer    DR Lin Landsman had an interesting hypothesis, what if this were from some type of costochondritis that resulted in cartilaginous fracture and bleeding in the past of summer 2022.   The pt steadfastly denies trauma or IVDU .      In any case it is not cancer but likely inflammatory.   I have tried to call the patient multiple times.   She is living with a friend off [address].     I have left two phone msg's on the patients cell  there is no answer,  I have tried to call at least 8 times between 4p and now to no avail   I am copying this phone note with Drs Lamonte Sakai and Kipp Brood.  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  She is well aware it at about the current MRI as she had a visit with Dr. Kipp Brood over the phone he explained the current findings to her.  She was not available when I tried to call her 2 weeks ago.  She agrees today to become primary care under my coverage as well as follow-up here for her pulmonary status.  She states she is not having swallowing difficulty  as she was before but has a burning sensation on the back of her neck she has bad dentition.  She feels a squeezing sensation in the chest neck area and pain in the left clavicle.  She also notes a slight increase swelling in the right side of the neck as well.  She has left-sided shoulder pain as well.  She states she did fall early in the spring down her stairs and tried to grab with her left hand she does not recall specifically injuring her shoulder at that visit.  Patient subsequent developed acute onset of the symptoms in the neck in June 2022.  She then saw her primary care provider who then referred  her to me.  See my July 2022 note.  At that point we ordered imaging of the neck with a CT scan.  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  And other findings of mass effect behind the left clavicle and also involvement of the tip of the first rib on the left and mediastinal adenopathy and mass effect under the chest wall.  Serial imagings over a period of time including PET scan and 2 other MRIs showed the mass lesion is reducing in size and the most recent MRI in February of this year showed marked reduction of all areas of involvement.  Patient denies any infectious disease history she denies any IV drug use.  No history of tuberculosis.  No fever chills or sweats.  Her weight is down somewhat.  She has not had any systemic blood draws.  Patient denies any further hemoptysis this only occurred in the summer 2022.  She is requesting a refill on a nitro drip patch which helped with bursitis of the right hip which she got from orthopedics in 2019.  She also wants her acyclovir refilled pill.  She would like refills on her chronic pain medicine.  She convened today with me today to see if any additional imaging studies can be performed and other evaluations be performed for this patient.  10/03/21   Patient seen in return visit and states she still having some posterior neck pain  but the shoulder pain and sternoclavicular pain has nearly resolved.  She has complained of a new problem and that she cracked a molar in her jaw and has been taking some of the oxycodone for the jaw pain.  She states the indomethacin has helped to some degree but is causing GI upset.  Patient is in need of a mammogram and colon cancer screening and agrees to receive these.  On arrival blood pressure is good 117/81.  The patient still smokes a pack a day of cigarettes.  She also needs a Pap smear.  She has upcoming in April and May Social Security determination assessments for disability with orthopedics and psychiatry.  8/1 This is a return visit for this 57 year old female with chronic pain in the left shoulder left clavicular area and left neck.  She went to the emergency room on 25 July with complaints of progressive pain.  CT imaging of the neck and chest were unremarkable for swelling or other abnormalities.  All previous abnormalities from 2020 to have now resolved.  Patient yet is still requesting more Percocets.  She needs to sign a chronic pain management pain control contract at this time since she is still requiring Percocets.  Patient now is completely homeless living in her car for the past 2 months this is exacerbated some of her chronic pain in her hip neck and back as she has to sleep in the car.  On arrival blood pressure 114/78.  Patient does admit to using cannabis. The patient does not need Pap smears she has had a complete cold knife conization and negative Pap smear in 2018 was told did not need further.  Patient has been seen by orthopedics in the past may yet need another referral.  Patient remains without any insurance at this time.  She is failed to achieve disability on several attempts.  10/3 Patient seen by way of a phone visit follow-up from August 1.  She is still living in her car and stays at a friend's home is able to go  in and shower wash close and occasionally take a nap  in large lives in her [employer].  She is having car trouble today could not make it to the appointment.  Pain is mostly in the shoulder.  She is able to get by by taking 1 Percocet daily.  She just had a 50 count given on 20 September.  She has a pending pain management appointment in [place].  She also needs dental appointment upcoming for tooth extraction.  She is followed up with her mental health doctor who is represcribed her medications.  She cannot afford her co-pay at this time and is trying to place these on an account.  She has no other complaints at this visit.   08/22/22  Past Medical History:  Diagnosis Date   Bilateral carpal tunnel syndrome 03/19/2018   Cervical cancer (HCC)    Cervical cancer ([place])    Hemoptysis 01/11/2021   History of exercise stress test    ETT-Echo 5/18: normal EF, no ischemia   History of loop recorder    currently on person   Kidney stone    Pharyngeal dysphagia 01/11/2021    Past Surgical History:  Procedure Laterality Date   CERVICAL CONE BIOPSY     TUBAL LIGATION      Family History  Problem Relation Age of Onset   Heart attack Mother 81   Heart disease Father    Heart attack Father 4   Liver cancer Father    Bone cancer Other    Breast cancer Maternal Aunt        Diagnosed in 20's   Bone cancer Maternal Aunt     Social History   Socioeconomic History   Marital status: Widowed    Spouse name: Not on file   Number of children: Not on file   Years of education: Not on file   Highest education level: Not on file  Occupational History   Occupation: unemployed  Tobacco Use   Smoking status: Every Day    Packs/day: 0.50    Years: 36.00    Total pack years: 18.00    Types: Cigarettes    Start date: 37   Smokeless tobacco: Never   Tobacco comments:    Smokes 1 pack a day ARJ 01/20/21  Vaping Use   Vaping Use: Never used  Substance and Sexual Activity   Alcohol use: No   Drug use: Yes    Types: Marijuana    Comment: stopped  04/06/2017   Sexual activity: Not Currently    Birth control/protection: None  Other Topics Concern   Not on file  Social History Narrative   Prior traffic control specialist - unemployed now   Widow   5 children, 10 grandchildren   Moved here from [place] 2013   Social Determinants of Health   Financial Resource Strain: Not on file  Food Insecurity: Not on file  Transportation Needs: Not on file  Physical Activity: Not on file  Stress: Not on file  Social Connections: Not on file  Intimate Partner Violence: Not on file    Outpatient Medications Prior to Visit  Medication Sig Dispense Refill   albuterol (VENTOLIN HFA) 108 (90 Base) MCG/ACT inhaler Inhale 2 puffs into the lungs every 6 (six) hours as needed for wheezing or shortness of breath. 6.7 g 2   amoxicillin (AMOXIL) 500 MG capsule Take 1 capsule (500 mg total) by mouth every 8 (eight) hours, for 7 days. 21 capsule 0   chlorhexidine (  PERIDEX) 0.12 % solution Rinse mouth with 15 mLs (1 capful) for 30 seconds in the morning and in the evening after toothbrushing. then SPIT after rinsing, DO NOT SWALLOW 473 mL 2   cyclobenzaprine (FLEXERIL) 10 MG tablet Take 1 tablet (10 mg total) by mouth 3 (three) times daily as needed for muscle spasms. 30 tablet 2   ibuprofen (ADVIL) 600 MG tablet Take 1 tablet (600 mg total) by mouth every 8 (eight) hours as needed. 90 tablet 2   ibuprofen (IBU) 800 MG tablet Take 1 tablet (800 mg total) by mouth every 6-8 hours as needed. 20 tablet 0   lamoTRIgine (LAMICTAL) 100 MG tablet Take 3 tablets (300 mg total) by mouth daily. 90 tablet 2   naloxone (NARCAN) nasal spray 4 mg/0.1 mL Use in case of accidental overdose 2 each 0   nitroGLYCERIN (NITRODUR - DOSED IN MG/24 HR) 0.2 mg/hr patch Apply 1/4 patch daily to the affected area 30 patch 1   omeprazole (PRILOSEC) 20 MG capsule Take 1 capsule (20 mg total) by mouth daily. 90 capsule 0   oxyCODONE-acetaminophen (PERCOCET) 10-325 MG tablet Take 1 tablet  by mouth every 8 (eight) hours as needed for pain. 60 tablet 0   penicillin v potassium (VEETID) 500 MG tablet Take 1 tablet (500 mg total) by mouth 4 (four) times daily unitl gone 56 tablet 0   pravastatin (PRAVACHOL) 40 MG tablet Take 1 tablet (40 mg total) by mouth daily. 30 tablet 3   QUEtiapine (SEROQUEL) 200 MG tablet Take 1 tablet (200 mg total) by mouth at bedtime. 30 tablet 2   traZODone (DESYREL) 50 MG tablet Take 1 tablet (50 mg total) by mouth at bedtime as needed for sleep. 30 tablet 2   valACYclovir (VALTREX) 500 MG tablet Take 1 tablet (500 mg total) by mouth daily. 60 tablet 1   Vitamin D, Ergocalciferol, (DRISDOL) 1.25 MG (50000 UNIT) CAPS capsule Take 1 capsule (50,000 Units total) by mouth every 7 (seven) days. 5 capsule 5   No facility-administered medications prior to visit.    No Known Allergies  ROS Review of Systems  Constitutional:  Negative for chills, diaphoresis, fatigue, fever and unexpected weight change.  HENT:  Negative for dental problem, ear pain, postnasal drip, rhinorrhea, sinus pressure, sore throat, trouble swallowing and voice change.   Eyes: Negative.   Respiratory: Negative.  Negative for apnea, cough, choking, chest tightness, shortness of breath, wheezing and stridor.        No further hemoptysis  Cardiovascular:  Negative for chest pain, palpitations and leg swelling.  Gastrointestinal: Negative.  Negative for abdominal distention, abdominal pain, nausea and vomiting.  Genitourinary: Negative.   Musculoskeletal:  Positive for neck pain and neck stiffness. Negative for arthralgias and myalgias.       Left acromioclavicular joint pain  Skin: Negative.  Negative for rash.  Allergic/Immunologic: Negative.  Negative for environmental allergies and food allergies.  Neurological: Negative.  Negative for dizziness, syncope, weakness and headaches.  Hematological: Negative.  Negative for adenopathy. Does not bruise/bleed easily.  Psychiatric/Behavioral:   Negative for agitation, dysphoric mood and sleep disturbance. The patient is not nervous/anxious.       Objective:    Phone visit no exam  LMP 04/29/2012  Wt Readings from Last 3 Encounters:  03/08/22 180 lb (81.6 kg)  02/07/22 183 lb (83 kg)  01/30/22 169 lb 15.6 oz (77.1 kg)     Health Maintenance Due  Topic Date Due   COLON CANCER SCREENING  ANNUAL FOBT  Never done   INFLUENZA VACCINE  Never done    There are no preventive care reminders to display for this patient.  Lab Results  Component Value Date   TSH 2.640 09/05/2021   Lab Results  Component Value Date   WBC 6.8 01/31/2022   HGB 12.5 01/31/2022   HCT 37.2 01/31/2022   MCV 94.4 01/31/2022   PLT 339 01/31/2022   Lab Results  Component Value Date   NA 139 02/07/2022   K 4.3 02/07/2022   CO2 23 02/07/2022   GLUCOSE 107 (H) 02/07/2022   BUN 10 02/07/2022   CREATININE 1.08 (H) 02/07/2022   BILITOT 0.2 02/07/2022   ALKPHOS 129 (H) 02/07/2022   AST 11 02/07/2022   ALT 10 02/07/2022   PROT 6.7 02/07/2022   ALBUMIN 4.5 02/07/2022   CALCIUM 9.1 02/07/2022   ANIONGAP 6 01/31/2022   EGFR 61 02/07/2022   Lab Results  Component Value Date   CHOL 207 (H) 02/07/2022   Lab Results  Component Value Date   HDL 52 02/07/2022   Lab Results  Component Value Date   LDLCALC 119 (H) 02/07/2022   Lab Results  Component Value Date   TRIG 206 (H) 02/07/2022   Lab Results  Component Value Date   CHOLHDL 4.0 02/07/2022   Lab Results  Component Value Date   HGBA1C 5.7 (H) 02/07/2022  CT Neck 01/19/21 EXAM: CT NECK WITH CONTRAST   TECHNIQUE: Multidetector CT imaging of the neck was performed using the standard protocol following the bolus administration of intravenous contrast.   CONTRAST:  16m OMNIPAQUE IOHEXOL 350 MG/ML SOLN   COMPARISON:  08/19/2020   FINDINGS: Pharynx and larynx: No evidence of primary mass or inflammation along the mucosa   Salivary glands: No inflammation, mass, or stone.    Thyroid: Normal.   Lymph nodes: Bilateral jugular chain adenopathy. A right level 4 node measures 19 mm maximum on axial images. There is an infiltrative process at the left thoracic inlet and upper mediastinum, with sheet like soft tissue thickening posterior to the jugular notch and manubrium. Just anterior to the left IJ brachiocephalic confluence is a cystic appearing component measuring 12 mm. Soft tissue density extends anterior to the left first costochondral junction. There is reticulation and adjacent left upper lobe with partially covered layering left pleural effusion.   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   Limited intracranial: Negative   Visualized orbits: Negative   Mastoids and visualized paranasal sinuses: Clear   Skeleton: No erosion of ribs or joint spaces adjacent to the inflammation, although the left first costochondral junction is wider than the right.   Upper chest: Reticular opacity in the subpleural left upper lobe adjacent to the chest wall process.   These results will be called to the ordering clinician or representative by the Radiologist Assistant, and communication documented in the PACS or [employer]   IMPRESSION: Infiltrative process centered on the left chest wall, especially at left first costochondral junction, with regional adenopathy , mediastinal fat infiltration, and apical pulmonary opacity. Atypical infection or malignancy are both possible.   MRI 05/2021 FINDINGS: Along the inferomedial margin of the left subclavius muscle for example on image 26 of series 24 and also shown on image 26 of series 18, we demonstrate a somewhat infiltrative 2.7 by 2.2 by 2.6 cm (volume = 8.1 cm^3) process. When I compare back to prior exams, the examinations of 01/18/2021 showed  a substantially greater amount of abnormal enhancement in the retrosternal region, anterior mediastinum, and in  the anterior left upper lobe adjacent to this lesion. By the time of the 02/03/2021 PET-CT, there was new bony destruction of the left anterior first rib but was not present on the earlier 01/27/2021 examinations. On today's exam there is substantial reduction in the overall previously enhancing soft tissue both in the subclavicular and subpectoral region and in the mediastinum, with anterior mediastinal and retrosternal involvement essentially resolved.   Moreover, the previous right level IV adenopathy in the lower neck shown on prior PET-CT has resolved. Please note that today's examination was not a diagnostic CT of the neck but does include the region of the prior enlarged right level IV lymph nodes. Prior AP window lymph node previously 1.1 cm in short axis diameter, currently 0.4 cm in short axis on image 46 series 17.   There is some accentuated enhancement medially in the left subclavian small cell compared to right for example on image 25 of series 24 which may be related to the underlying process.   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   IMPRESSION: 1. Substantial improvement in the abnormal infiltrative process in the left medial infraclavicular region, along the inferior margin of the medial left subclavius muscle. On previous exams this process demonstrated substantial mediastinal, substernal, and anterior left upper lobe involvement on the CT scans from July, and on the PET-CT had progressed to bony destructive findings in the anterior left first rib. On today's examination, the pulmonary, mediastinal, and substernal components have resolved, rib involvement is now questionable, and the effect of this  process on the adjacent internal jugular and subclavian veins is substantially reduced. Moreover, the right level IV adenopathy and mediastinal adenopathy shown on the prior PET-CT has resolved. The remaining enhancing lesion measures up to 2.7 cm in long axis. The substantial improvement without known specific therapy would seem to suggest inflammatory or infectious process, but I do note that the patient did not have leukocytosis back in August. Possibility of an underlying mass or vascular malformation which previously bled causing inflammatory response without substantial leukocytosis is a possibility, although the aggressive findings previously present along the left anterior first rib with tended favor either tumor or infection rather than the sequela of a hematoma. Overall given the substantial improvement, surveillance might be a reasonable option although open biopsy to definitively sample and/or remove the underlying lesion with likewise be a very reasonable choice. MRI chest 08/25/21 FINDINGS: Bones/Joint/Cartilage:   The previously demonstrated process anteriorly in the upper left chest wall between the left clavicular head and anterior aspect of the left 1st rib shows continued improvement. There is mild residual asymmetric soft tissue enhancement in this area which extends around the left sternoclavicular joint. No progressive bone destruction identified. No evidence of soft tissue mass.   Ligaments:   No ligamentous abnormalities identified.   Muscles and Tendons:   The overlying pectoralis musculature appears normal.   Soft tissue:   As above, mild residual soft tissue enhancement in the area of the previously demonstrated abnormality. No fluid collection or soft tissue mass identified. The left subclavian and left internal jugular veins appear patent.   IMPRESSION: 1. Further improvement in the  previously demonstrated process involving the left anterior  chest wall centered at the left 1st costochondral junction. The continued improvement favors a resolving inflammatory/infectious process. No mass lesion identified to suggest neoplasm. 2. This process was better demonstrated by CT, and if additional follow-is warranted clinically, consider neck CT with contrast.   CTs 01/2022 ED: CT Soft Tissue Neck W Contrast   Result Date: 01/31/2022 CLINICAL DATA:  57 year old female with history of a infiltrative process at the left anterior 1st rib, superior mediastinum 1 year ago in July 2022. Biopsied x 2 with pathology NOT revealing malignancy, and subsequent resolution on CT and MRI suggesting an infectious or inflammatory process. Recurrent left neck and shoulder pain for 1.5 weeks. EXAM: CT NECK WITH CONTRAST TECHNIQUE: Multidetector CT imaging of the neck was performed using the standard protocol following the bolus administration of intravenous contrast. RADIATION DOSE REDUCTION: This exam was performed according to the departmental dose-optimization program which includes automated exposure control, adjustment of the mA and/or kV according to patient size and/or use of iterative reconstruction technique. CONTRAST:  52m OMNIPAQUE IOHEXOL 300 MG/ML  SOLN COMPARISON:  Neck CT 09/15/2021, 01/18/2021. Chest CT today reported separately. FINDINGS: Pharynx and larynx: Oropharynx motion artifact today. Pharyngeal and laryngeal soft tissue contours remain stable and within normal limits. Negative visible parapharyngeal and retropharyngeal spaces. Salivary glands: Negative. Motion artifact at the sublingual space which seems to remain negative. Thyroid: Negative. Lymph nodes: Left level 4 lymph nodes at the left thoracic inlet are small and within normal limits on series 4, image 75, stable since March. There is a larger 7-8 mm node at the junction of left level 3 and 4 on series 4, image 60, but also stable since March, smaller from last year, and within normal limits  by size criteria. Other bilateral level 2 and level 3 nodes also are within normal limits and smaller since July of last year. No cystic or necrotic nodes. Vascular: Major vascular structures in the neck and at the skull base including both internal jugular veins are patent. Left greater than right cervical carotid atherosclerosis. Limited intracranial: Mild Calcified atherosclerosis at the skull base. Negative visible brain parenchyma. Visualized orbits: Negative. Mastoids and visualized paranasal sinuses: Visualized paranasal sinuses and mastoids are stable and well aerated. Skeleton: Motion artifact, especially the mandible. Chronic C5-C6 disc and endplate degeneration. No acute or suspicious osseous lesion identified in the neck. Upper chest: Chest CT today is reported separately. IMPRESSION: 1. No acute or inflammatory process identified in the neck. Stable since March and satisfactory CT appearance of the Neck, with resolved lymphadenopathy and left thoracic inlet inflammation since 2022. 2. Chest CT today reported separately. Electronically Signed   By: HGenevie AnnM.D.   On: 01/31/2022 10:04    CT Chest W Contrast   Result Date: 01/31/2022 CLINICAL DATA:  Left neck and shoulder pain for 1.5 weeks. History of tumor. EXAM: CT CHEST WITH CONTRAST TECHNIQUE: Multidetector CT imaging of the chest was performed during intravenous contrast administration. RADIATION DOSE REDUCTION: This exam was performed according to the departmental dose-optimization program which includes automated exposure control, adjustment of the mA and/or kV according to patient size and/or use of iterative reconstruction technique. CONTRAST:  7100mOMNIPAQUE IOHEXOL 300 MG/ML  SOLN COMPARISON:  CT chest 09/16/2018 FINDINGS: Cardiovascular: Heart size is normal. There is no pericardial effusion. Mild calcifications of the left anterior descending artery are noted. The remainder of the thoracic vasculature is unremarkable. Mediastinum/Nodes:  The thyroid is unremarkable. The esophagus is  grossly unremarkable. There is no mediastinal, hilar, or axillary lymphadenopathy. Lungs/Pleura: The trachea and central airways are patent. The lungs clear. There is no focal consolidation or pulmonary edema. There is no pleural effusion or pneumothorax. There is upper lobe predominant emphysema, stable. There is a 5 mm nodule in the posteromedial left lower lobe (4-102), unchanged since the study from 08/19/2020 and 01/19/2020. No specific imaging follow-up is required for this nodule. There are no new or enlarging nodules. Upper Abdomen: The imaged portions of the upper abdominal viscera are unremarkable. Musculoskeletal: There is no acute osseous abnormality or suspicious osseous lesion. IMPRESSION: 1. No acute process in the chest or significant change since 09/16/2018. No lymphadenopathy identified. 2. Emphysema (ICD10-J43.9). Electronically Signed   By: Valetta Mole M.D.   On: 01/31/2022 10:01        Assessment & Plan:   Problem List Items Addressed This Visit   None No orders of the defined types were placed in this encounter. Follow Up Instructions: Patient will have follow-up scheduled in January   I discussed the assessment and treatment plan with the patient. The patient was provided an opportunity to ask questions and all were answered. The patient agreed with the plan and demonstrated an understanding of the instructions.   The patient was advised to call back or seek an in-person evaluation if the symptoms worsen or if the condition fails to improve as anticipated.  I provided 21 minutes of non-face-to-face time during this encounter  including  median intraservice time , review of notes, labs, imaging, medications  and explaining diagnosis and management to the patient .    Asencion Noble, MD  Follow-up: No follow-ups on file.

## 2022-08-28 NOTE — Progress Notes (Unsigned)
Established Patient Office Visit  Subjective:  Patient ID: Katie Sherman, female    DOB: 1966-02-26  Age: 57 y.o. MRN: [id number]  CC: Chronic pain in the left side of the neck and clavicle  HPI  09/05/21 Katie Sherman presents for primary care to establish and follow-up of chronic condition of the chest and neck. Below are series of the previous documentations I have on this patient when I have seen her 1 time only in July 2022 for pulmonary consultation by her patient's primary care provider Dr. Wynetta Emery.  Subsequent to this the patient was cared for by pulmonary medicine Dr. Lamonte Sakai and thoracic surgery Dr. Kipp Brood and was seen 1 time by infectious disease Dr. Linus Salmons.  01/11/21 This a 57 year old female referred for hemoptysis difficulty swallowing and neck fullness, neck pain and supraclavicular fullness bilaterally of 4 weeks duration.  The patient's primary care provider is Dr. Wynetta Emery.  The patient's hemoptysis is pure blood with clots not mixed with mucus.  She had an episode of minimal hemoptysis a year ago last summer in 2021.  This went away she then developed COVID to beginning portion of 2022 recovered from this.  Patient began having neck swelling and pain 4 weeks ago and presented for evaluations both in the clinic in the emergency room.  Evaluations were not revealing.   At that time she was not having hemoptysis but over the past week she has had increased coughing up of blood combination with progressive swelling in the neck in the supraclavicular areas.  She is also having difficulty with swallowing as well.  Patient does smoke a pack a day of cigarettes and is under a lot of stress unable to reduce her tobacco intake.  She does have some dyspnea on exertion but no wheezing.  She is never been diagnosed with pneumonia.  She did have a CT of the chest in February of this year showing some patchy basilar groundglass changes.  She has also chronic subpleural nodules which have been  unchanged compared to previous exam in 2021 versus February 2022.  The patient's been out of work the last 2 weeks because of the difficulty she is now experiencing.   Patient's past medical history significant for cervical cancer in her self and family history of breast cancer.  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   Note ED visit 01/01/21 Katie Sherman is a 57 y.o. female with past medical history of tobacco use, cervical cancer.  Patient presents to the emergency department with a chief complaint of swelling and tenderness to her neck.  Patient reports that her symptoms started on Wednesday and were located on the left side of her neck.  Patient also endorsed that she was having some difficulty and pain when swallowing liquids but "only on the left side of my throat."  She reports that Thursday morning swelling and pain wear to both sides of her throat.  Reports progressive worsening of symptoms on Friday.  Patient reports that she tried to take your prescribed Acacian Friday morning but it "felt like my throat was closing up.".  States that it felt like one of her pills had become stuck in her throat.  Patient reports that she "stuck a wooden spoon down my throat," in an attempt to push the pill further  along.  Patient reports that she had an episode of vomiting after this.     At present patient reports that pain is only located to the right side of her neck.  Patient rates her pain 10/10 on the pain scale.  Patient reports that pain is worse with movement and touch.  Patient reports that she has had some relief with ibuprofen earlier in the week but has not taken any today.    08/26/21 phone note I attempted to call the patient to inform her about her MRI results. Note there was a  phone virtual visit with Dr Kipp Brood who spoke to the patient around 2pm today but due to high ED volume the MRI had yet to read at that point. I have received two very long MyChart messages from the patient this afternoon expressing her frustrations        I spoke to Dr Camie Patience, Chief of Radiology [facility] on the results  He and I reviewed all prior imaging to date.   The process appears to have started behind the sternoclavicular joint and first rib at the cartilaginous junction posteriorly.  It has significantly regressed and all the reactive mediastinal cervical and axillary adenopathy has disappeared   There is no cervical spine pathology.  The interna Jugular is patent .  As this was a combined MRI MRA study all great vessels to and from the heart are NORMAL   This does NOT represent Cancer    DR Lin Landsman had an interesting hypothesis, what if this were from some type of costochondritis that resulted in cartilaginous fracture and bleeding in the past of summer 2022.   The pt steadfastly denies trauma or IVDU .      In any case it is not cancer but likely inflammatory.   I have tried to call the patient multiple times.   She is living with a friend off [address].     I have left two phone msg's on the patients cell  there is no answer,  I have tried to call at least 8 times between 4p and now to no avail   I am copying this phone note with Drs Lamonte Sakai and Kipp Brood.  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  She is well aware it at about the current MRI as she had a visit with Dr. Kipp Brood over the phone he explained the current findings to her.  She was not available when I tried to call her 2 weeks ago.  She agrees today to become primary care under my coverage as well as follow-up here for her pulmonary status.  She states she is not having swallowing difficulty as she was before but has a  burning sensation on the back of her neck she has bad dentition.  She feels a squeezing sensation in the chest neck area and pain in the left clavicle.  She also notes a slight increase swelling in the right side of the neck as well.  She has left-sided shoulder pain as well.  She states she did fall early in the spring down her stairs and tried to grab with her left hand she does not recall specifically injuring her shoulder at that visit.  Patient subsequent developed acute onset of the symptoms in the neck in June 2022.  She then saw her primary care provider who then referred her to me.  See my July  2022 note.  At that point we ordered imaging of the neck with a CT scan.  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  And other findings of mass effect behind the left clavicle and also involvement of the tip of the first rib on the left and mediastinal adenopathy and mass effect under the chest wall.  Serial imagings over a period of time including PET scan and 2 other MRIs showed the mass lesion is reducing in size and the most recent MRI in February of this year showed marked reduction of all areas of involvement.  Patient denies any infectious disease history she denies any IV drug use.  No history of tuberculosis.  No fever chills or sweats.  Her weight is down somewhat.  She has not had any systemic blood draws.  Patient denies any further hemoptysis this only occurred in the summer 2022.  She is requesting a refill on a nitro drip patch which helped with bursitis of the right hip which she got from orthopedics in 2019.  She also wants her acyclovir refilled pill.  She would like refills on her chronic pain medicine.  She convened today with me today to see if any additional imaging studies can be performed and other evaluations be performed for this patient.  10/03/21   Patient seen in return visit and states she still having some posterior neck pain but the shoulder pain and  sternoclavicular pain has nearly resolved.  She has complained of a new problem and that she cracked a molar in her jaw and has been taking some of the oxycodone for the jaw pain.  She states the indomethacin has helped to some degree but is causing GI upset.  Patient is in need of a mammogram and colon cancer screening and agrees to receive these.  On arrival blood pressure is good 117/81.  The patient still smokes a pack a day of cigarettes.  She also needs a Pap smear.  She has upcoming in April and May Social Security determination assessments for disability with orthopedics and psychiatry.  8/1 This is a return visit for this 57 year old female with chronic pain in the left shoulder left clavicular area and left neck.  She went to the emergency room on 25 July with complaints of progressive pain.  CT imaging of the neck and chest were unremarkable for swelling or other abnormalities.  All previous abnormalities from 2020 to have now resolved.  Patient yet is still requesting more Percocets.  She needs to sign a chronic pain management pain control contract at this time since she is still requiring Percocets.  Patient now is completely homeless living in her car for the past 2 months this is exacerbated some of her chronic pain in her hip neck and back as she has to sleep in the car.  On arrival blood pressure 114/78.  Patient does admit to using cannabis. The patient does not need Pap smears she has had a complete cold knife conization and negative Pap smear in 2018 was told did not need further.  Patient has been seen by orthopedics in the past may yet need another referral.  Patient remains without any insurance at this time.  She is failed to achieve disability on several attempts.  10/3 Patient seen by way of a phone visit follow-up from August 1.  She is still living in her car and stays at a friend's home is able to go in and shower wash close and occasionally  take a nap in large lives in her  [employer].  She is having car trouble today could not make it to the appointment.  Pain is mostly in the shoulder.  She is able to get by by taking 1 Percocet daily.  She just had a 50 count given on 20 September.  She has a pending pain management appointment in [place].  She also needs dental appointment upcoming for tooth extraction.  She is followed up with her mental health doctor who is represcribed her medications.  She cannot afford her co-pay at this time and is trying to place these on an account.  She has no other complaints at this visit.   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  This is likely the cause of the patient's chronic left neck clavicle and shoulder pain.  In retrospect the mass in the chest was likely hemorrhage from a fall and trauma.  She fell down stairs and caught her left arm on the rail.  This is likely the long-term cause of the patient's conditions and imaging studies.  Patient now is going to a pain clinic on: [address].  She denies any further swallowing difficulty she has occasional dizziness and nausea.  The patient is not able to get her acyclovir or pravastatin due to lack of money to pay for the co-pay she does have all her mental health medicines and her pain medication.  Blood pressure initially elevated on arrival 144/87 on recheck it is 123/78.  She was in a rush to get here today  Below is documentation of recent orthopedic visit Katie Sherman is a 57 y.o. female who presents to the office for review of left shoulder MRI.  MRI demonstrates significant tendinosis of the supraspinatus with articular surface tear.  There is a high-grade partial-thickness tear that encompasses a fairly significant amount of the tendon but it is not full-thickness.  No significant bicep tendon pathology and she has a little bit of partial-thickness cartilage loss of the glenohumeral joint.  Went  over these images with her today and discussed options available to patient which mainly would be physical therapy versus ultrasound-guided glenohumeral injection versus surgical intervention in the form of rotator cuff repair with bicep tenodesis.  After discussion of options including what to expect from surgery as well as the recovery timeframe, she would like to think over her options and she will contact the office to discuss further steps.    Patient is edentulous and is waiting on dentures.  Patient still smoking a pack a day of cigarettes.  She declines a flu vaccine.  She needs colon cancer screening but cannot follow through on colonoscopy at this time.  The patient was not able to process the fecal occult kit given last year. The patient is still having to live in her [employer] automobile  Past Medical History:  Diagnosis Date   Bilateral carpal tunnel syndrome 03/19/2018   Cervical cancer ([place])    Cervical cancer ([place])    Hemoptysis 01/11/2021   History of exercise stress test    ETT-Echo 5/18: normal EF, no ischemia   History of loop recorder    currently on person   Kidney stone    Pharyngeal dysphagia 01/11/2021    Past Surgical History:  Procedure Laterality Date   CERVICAL CONE BIOPSY     TUBAL LIGATION      Family History  Problem  Relation Age of Onset   Heart attack Mother 64   Heart disease Father    Heart attack Father 52   Liver cancer Father    Bone cancer Other    Breast cancer Maternal Aunt        Diagnosed in 63's   Bone cancer Maternal Aunt     Social History   Socioeconomic History   Marital status: Widowed    Spouse name: Not on file   Number of children: Not on file   Years of education: Not on file   Highest education level: Not on file  Occupational History   Occupation: unemployed  Tobacco Use   Smoking status: Every Day    Packs/day: 0.50    Years: 36.00    Total pack years: 18.00    Types: Cigarettes    Start date: 65    Smokeless tobacco: Never   Tobacco comments:    Smokes 1 pack a day ARJ 01/20/21  Vaping Use   Vaping Use: Never used  Substance and Sexual Activity   Alcohol use: No   Drug use: Yes    Types: Marijuana    Comment: stopped 04/06/2017   Sexual activity: Not Currently    Birth control/protection: None  Other Topics Concern   Not on file  Social History Narrative   Prior traffic control specialist - unemployed now   Widow   5 children, 10 grandchildren   Moved here from [place] 2013   Social Determinants of Health   Financial Resource Strain: Not on file  Food Insecurity: Not on file  Transportation Needs: Not on file  Physical Activity: Not on file  Stress: Not on file  Social Connections: Not on file  Intimate Partner Violence: Not on file    Outpatient Medications Prior to Visit  Medication Sig Dispense Refill   albuterol (VENTOLIN HFA) 108 (90 Base) MCG/ACT inhaler Inhale 2 puffs into the lungs every 6 (six) hours as needed for wheezing or shortness of breath. (Patient not taking: Reported on 08/29/2022) 6.7 g 2   chlorhexidine (PERIDEX) 0.12 % solution Rinse mouth with 15 mLs (1 capful) for 30 seconds in the morning and in the evening after toothbrushing. then SPIT after rinsing, DO NOT SWALLOW (Patient not taking: Reported on 08/29/2022) 473 mL 2   ibuprofen (ADVIL) 600 MG tablet Take 1 tablet (600 mg total) by mouth every 8 (eight) hours as needed. (Patient not taking: Reported on 08/29/2022) 90 tablet 2   ibuprofen (IBU) 800 MG tablet Take 1 tablet (800 mg total) by mouth every 6-8 hours as needed. (Patient not taking: Reported on 08/29/2022) 20 tablet 0   lamoTRIgine (LAMICTAL) 100 MG tablet Take 3 tablets (300 mg total) by mouth daily. (Patient not taking: Reported on 08/29/2022) 90 tablet 2   naloxone (NARCAN) nasal spray 4 mg/0.1 mL Use in case of accidental overdose (Patient not taking: Reported on 08/29/2022) 2 each 0   nitroGLYCERIN (NITRODUR - DOSED IN MG/24 HR) 0.2 mg/hr  patch Apply 1/4 patch daily to the affected area (Patient not taking: Reported on 08/29/2022) 30 patch 1   oxyCODONE-acetaminophen (PERCOCET) 10-325 MG tablet Take 1 tablet by mouth every 8 (eight) hours as needed for pain. (Patient not taking: Reported on 08/29/2022) 60 tablet 0   pravastatin (PRAVACHOL) 40 MG tablet Take 1 tablet (40 mg total) by mouth daily. (Patient not taking: Reported on 08/29/2022) 30 tablet 3   QUEtiapine (SEROQUEL) 200 MG tablet Take 1 tablet (200 mg total)  by mouth at bedtime. (Patient not taking: Reported on 08/29/2022) 30 tablet 2   traZODone (DESYREL) 50 MG tablet Take 1 tablet (50 mg total) by mouth at bedtime as needed for sleep. (Patient not taking: Reported on 08/29/2022) 30 tablet 2   valACYclovir (VALTREX) 500 MG tablet Take 1 tablet (500 mg total) by mouth daily. (Patient not taking: Reported on 08/29/2022) 60 tablet 1   Vitamin D, Ergocalciferol, (DRISDOL) 1.25 MG (50000 UNIT) CAPS capsule Take 1 capsule (50,000 Units total) by mouth every 7 (seven) days. (Patient not taking: Reported on 08/29/2022) 5 capsule 5   cyclobenzaprine (FLEXERIL) 10 MG tablet Take 1 tablet (10 mg total) by mouth 3 (three) times daily as needed for muscle spasms. (Patient not taking: Reported on 08/29/2022) 30 tablet 2   omeprazole (PRILOSEC) 20 MG capsule Take 1 capsule (20 mg total) by mouth daily. (Patient not taking: Reported on 08/29/2022) 90 capsule 0   No facility-administered medications prior to visit.    No Known Allergies  ROS Review of Systems  Constitutional:  Negative for chills, diaphoresis, fatigue, fever and unexpected weight change.  HENT:  Negative for dental problem, ear pain, postnasal drip, rhinorrhea, sinus pressure, sore throat, trouble swallowing and voice change.   Eyes: Negative.   Respiratory: Negative.  Negative for apnea, cough, choking, chest tightness, shortness of breath, wheezing and stridor.        No further hemoptysis  Cardiovascular:  Negative for chest  pain, palpitations and leg swelling.  Gastrointestinal: Negative.  Negative for abdominal distention, abdominal pain, nausea and vomiting.  Genitourinary: Negative.   Musculoskeletal:  Positive for neck pain. Negative for arthralgias, myalgias and neck stiffness.       Left acromioclavicular joint pain  Skin: Negative.  Negative for rash.  Allergic/Immunologic: Negative.  Negative for environmental allergies and food allergies.  Neurological: Negative.  Negative for dizziness, syncope, weakness and headaches.  Hematological: Negative.  Negative for adenopathy. Does not bruise/bleed easily.  Psychiatric/Behavioral:  Negative for agitation, dysphoric mood and sleep disturbance. The patient is not nervous/anxious.       Objective:    Vitals:   08/29/22 1340 08/29/22 1358  BP: (!) 144/87 123/78  Pulse: 72   SpO2: 99%   Weight: 174 lb 6.4 oz (79.1 kg)   Height: 5' 6.5" (1.689 m)     Gen: Pleasant, well-nourished, in no distress,  normal affect  ENT: No lesions,  mouth clear,  oropharynx clear, no postnasal drip patient is edentulous  Neck: No JVD, no TMG, no carotid bruits  Lungs: No use of accessory muscles, no dullness to percussion, clear without rales or rhonchi  Cardiovascular: RRR, heart sounds normal, no murmur or gallops, no peripheral edema  Abdomen: soft and NT, no HSM,  BS normal  Musculoskeletal: No deformities, no cyanosis or clubbing, tenderness left acromioclavicular joint with decreased range of motion  Neuro: alert, non focal  Skin: Warm, no lesions or rashes     BP 123/78   Pulse 72   Ht 5' 6.5" (1.689 m)   Wt 174 lb 6.4 oz (79.1 kg)   LMP 04/29/2012   SpO2 99%   BMI 27.73 kg/m  Wt Readings from Last 3 Encounters:  08/29/22 174 lb 6.4 oz (79.1 kg)  03/08/22 180 lb (81.6 kg)  02/07/22 183 lb (83 kg)     Health Maintenance Due  Topic Date Due   COLON CANCER SCREENING ANNUAL FOBT  Never done    There are no preventive care reminders  to  display for this patient.  Lab Results  Component Value Date   TSH 2.640 09/05/2021   Lab Results  Component Value Date   WBC 6.8 01/31/2022   HGB 12.5 01/31/2022   HCT 37.2 01/31/2022   MCV 94.4 01/31/2022   PLT 339 01/31/2022   Lab Results  Component Value Date   NA 139 02/07/2022   K 4.3 02/07/2022   CO2 23 02/07/2022   GLUCOSE 107 (H) 02/07/2022   BUN 10 02/07/2022   CREATININE 1.08 (H) 02/07/2022   BILITOT 0.2 02/07/2022   ALKPHOS 129 (H) 02/07/2022   AST 11 02/07/2022   ALT 10 02/07/2022   PROT 6.7 02/07/2022   ALBUMIN 4.5 02/07/2022   CALCIUM 9.1 02/07/2022   ANIONGAP 6 01/31/2022   EGFR 61 02/07/2022   Lab Results  Component Value Date   CHOL 207 (H) 02/07/2022   Lab Results  Component Value Date   HDL 52 02/07/2022   Lab Results  Component Value Date   LDLCALC 119 (H) 02/07/2022   Lab Results  Component Value Date   TRIG 206 (H) 02/07/2022   Lab Results  Component Value Date   CHOLHDL 4.0 02/07/2022   Lab Results  Component Value Date   HGBA1C 5.7 (H) 02/07/2022  CT Neck 01/19/21 EXAM: CT NECK WITH CONTRAST   TECHNIQUE: Multidetector CT imaging of the neck was performed using the standard protocol following the bolus administration of intravenous contrast.   CONTRAST:  150m OMNIPAQUE IOHEXOL 350 MG/ML SOLN   COMPARISON:  08/19/2020   FINDINGS: Pharynx and larynx: No evidence of primary mass or inflammation along the mucosa   Salivary glands: No inflammation, mass, or stone.   Thyroid: Normal.   Lymph nodes: Bilateral jugular chain adenopathy. A right level 4 node measures 19 mm maximum on axial images. There is an infiltrative process at the left thoracic inlet and upper mediastinum, with sheet like soft tissue thickening posterior to the jugular notch and manubrium. Just anterior to the left IJ brachiocephalic confluence is a cystic appearing component measuring 12 mm. Soft tissue density extends anterior to the left  first costochondral junction. There is reticulation and adjacent left upper lobe with partially covered layering left pleural effusion.   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   Limited intracranial: Negative   Visualized orbits: Negative   Mastoids and visualized paranasal sinuses: Clear   Skeleton: No erosion of ribs or joint spaces adjacent to the inflammation, although the left first costochondral junction is wider than the right.   Upper chest: Reticular opacity in the subpleural left upper lobe adjacent to the chest wall process.   These results will be called to the ordering clinician or representative by the Radiologist Assistant, and communication documented in the PACS or [employer]   IMPRESSION: Infiltrative process centered on the left chest wall, especially at left first costochondral junction, with regional adenopathy , mediastinal fat infiltration, and apical pulmonary opacity. Atypical infection or malignancy are both possible.   MRI 05/2021 FINDINGS: Along the inferomedial margin of the left subclavius muscle for example on image 26 of series 24 and also shown on image 26 of series 18, we demonstrate a somewhat infiltrative 2.7 by 2.2 by 2.6 cm (volume = 8.1 cm^3) process. When I compare back to prior exams, the examinations of 01/18/2021 showed a substantially greater amount of abnormal enhancement in the retrosternal region, anterior mediastinum, and in the anterior left upper lobe  adjacent to this lesion. By the time of the 02/03/2021 PET-CT, there was new bony destruction of the left anterior first rib but was not present on the earlier 01/27/2021 examinations. On today's exam there is substantial reduction in the overall previously enhancing soft tissue both in the subclavicular and subpectoral region and in the mediastinum, with anterior mediastinal and retrosternal involvement essentially  resolved.   Moreover, the previous right level IV adenopathy in the lower neck shown on prior PET-CT has resolved. Please note that today's examination was not a diagnostic CT of the neck but does include the region of the prior enlarged right level IV lymph nodes. Prior AP window lymph node previously 1.1 cm in short axis diameter, currently 0.4 cm in short axis on image 46 series 17.   There is some accentuated enhancement medially in the left subclavian small cell compared to right for example on image 25 of series 24 which may be related to the underlying process.   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   IMPRESSION: 1. Substantial improvement in the abnormal infiltrative process in the left medial infraclavicular region, along the inferior margin of the medial left subclavius muscle. On previous exams this process demonstrated substantial mediastinal, substernal, and anterior left upper lobe involvement on the CT scans from July, and on the PET-CT had progressed to bony destructive findings in the anterior left first rib. On today's examination, the pulmonary, mediastinal, and substernal components have resolved, rib involvement is now questionable, and the effect of this process on the adjacent internal jugular and subclavian veins is substantially reduced. Moreover, the right level IV adenopathy and mediastinal adenopathy shown on the prior PET-CT has resolved. The remaining enhancing lesion measures up to 2.7 cm in long axis. The substantial improvement without known specific therapy would seem to suggest inflammatory or infectious process, but I do note that the patient did not have leukocytosis back in August.  Possibility of an underlying mass or vascular malformation which previously bled causing inflammatory response without substantial leukocytosis is a possibility, although the aggressive findings previously present along the left anterior first rib with tended favor either tumor or infection rather than the sequela of a hematoma. Overall given the substantial improvement, surveillance might be a reasonable option although open biopsy to definitively sample and/or remove the underlying lesion with likewise be a very reasonable choice. MRI chest 08/25/21 FINDINGS: Bones/Joint/Cartilage:   The previously demonstrated process anteriorly in the upper left chest wall between the left clavicular head and anterior aspect of the left 1st rib shows continued improvement. There is mild residual asymmetric soft tissue enhancement in this area which extends around the left sternoclavicular joint. No progressive bone destruction identified. No evidence of soft tissue mass.   Ligaments:   No ligamentous abnormalities identified.   Muscles and Tendons:   The overlying pectoralis musculature appears normal.   Soft tissue:   As above, mild residual soft tissue enhancement in the area of the previously demonstrated abnormality. No fluid collection or soft tissue mass identified. The left subclavian and left internal jugular veins appear patent.   IMPRESSION: 1. Further improvement in the previously demonstrated process involving the left anterior chest wall centered at the left 1st costochondral junction. The continued improvement favors  a resolving inflammatory/infectious process. No mass lesion identified to suggest neoplasm. 2. This process was better demonstrated by CT, and if additional follow-is warranted clinically, consider neck CT with contrast.   CTs 01/2022 ED: CT Soft Tissue Neck W Contrast   Result Date: 01/31/2022 CLINICAL DATA:  57 year old female with history of a  infiltrative process at the left anterior 1st rib, superior mediastinum 1 year ago in July 2022. Biopsied x 2 with pathology NOT revealing malignancy, and subsequent resolution on CT and MRI suggesting an infectious or inflammatory process. Recurrent left neck and shoulder pain for 1.5 weeks. EXAM: CT NECK WITH CONTRAST TECHNIQUE: Multidetector CT imaging of the neck was performed using the standard protocol following the bolus administration of intravenous contrast. RADIATION DOSE REDUCTION: This exam was performed according to the departmental dose-optimization program which includes automated exposure control, adjustment of the mA and/or kV according to patient size and/or use of iterative reconstruction technique. CONTRAST:  88m OMNIPAQUE IOHEXOL 300 MG/ML  SOLN COMPARISON:  Neck CT 09/15/2021, 01/18/2021. Chest CT today reported separately. FINDINGS: Pharynx and larynx: Oropharynx motion artifact today. Pharyngeal and laryngeal soft tissue contours remain stable and within normal limits. Negative visible parapharyngeal and retropharyngeal spaces. Salivary glands: Negative. Motion artifact at the sublingual space which seems to remain negative. Thyroid: Negative. Lymph nodes: Left level 4 lymph nodes at the left thoracic inlet are small and within normal limits on series 4, image 75, stable since March. There is a larger 7-8 mm node at the junction of left level 3 and 4 on series 4, image 60, but also stable since March, smaller from last year, and within normal limits by size criteria. Other bilateral level 2 and level 3 nodes also are within normal limits and smaller since July of last year. No cystic or necrotic nodes. Vascular: Major vascular structures in the neck and at the skull base including both internal jugular veins are patent. Left greater than right cervical carotid atherosclerosis. Limited intracranial: Mild Calcified atherosclerosis at the skull base. Negative visible brain parenchyma.  Visualized orbits: Negative. Mastoids and visualized paranasal sinuses: Visualized paranasal sinuses and mastoids are stable and well aerated. Skeleton: Motion artifact, especially the mandible. Chronic C5-C6 disc and endplate degeneration. No acute or suspicious osseous lesion identified in the neck. Upper chest: Chest CT today is reported separately. IMPRESSION: 1. No acute or inflammatory process identified in the neck. Stable since March and satisfactory CT appearance of the Neck, with resolved lymphadenopathy and left thoracic inlet inflammation since 2022. 2. Chest CT today reported separately. Electronically Signed   By: HGenevie AnnM.D.   On: 01/31/2022 10:04    CT Chest W Contrast   Result Date: 01/31/2022 CLINICAL DATA:  Left neck and shoulder pain for 1.5 weeks. History of tumor. EXAM: CT CHEST WITH CONTRAST TECHNIQUE: Multidetector CT imaging of the chest was performed during intravenous contrast administration. RADIATION DOSE REDUCTION: This exam was performed according to the departmental dose-optimization program which includes automated exposure control, adjustment of the mA and/or kV according to patient size and/or use of iterative reconstruction technique. CONTRAST:  736mOMNIPAQUE IOHEXOL 300 MG/ML  SOLN COMPARISON:  CT chest 09/16/2018 FINDINGS: Cardiovascular: Heart size is normal. There is no pericardial effusion. Mild calcifications of the left anterior descending artery are noted. The remainder of the thoracic vasculature is unremarkable. Mediastinum/Nodes: The thyroid is unremarkable. The esophagus is grossly unremarkable. There is no mediastinal, hilar, or axillary lymphadenopathy. Lungs/Pleura: The trachea and central airways are patent. The lungs clear.  There is no focal consolidation or pulmonary edema. There is no pleural effusion or pneumothorax. There is upper lobe predominant emphysema, stable. There is a 5 mm nodule in the posteromedial left lower lobe (4-102), unchanged since the  study from 08/19/2020 and 01/19/2020. No specific imaging follow-up is required for this nodule. There are no new or enlarging nodules. Upper Abdomen: The imaged portions of the upper abdominal viscera are unremarkable. Musculoskeletal: There is no acute osseous abnormality or suspicious osseous lesion. IMPRESSION: 1. No acute process in the chest or significant change since 09/16/2018. No lymphadenopathy identified. 2. Emphysema (ICD10-J43.9). Electronically Signed   By: Valetta Mole M.D.   On: 01/31/2022 10:01        Assessment & Plan:   Problem List Items Addressed This Visit       Musculoskeletal and Integument   Adhesive capsulitis of left shoulder    Because of chronic pain patient is going to proceed with surgery      Relevant Medications   cyclobenzaprine (FLEXERIL) 10 MG tablet     Other   Family history of breast cancer    Follow-up mammogram this April last year was normal      Bipolar 1 disorder, depressed ([place])    Patient maintains current medications per psychiatry      Mixed hyperlipidemia - Primary    Will refill pravastatin when she gets money      Relevant Orders   Lipid panel   Vitamin D deficiency    Will refill once she gets finances      Tobacco dependence    Patient does not engage around smoking cessation    Current smoking consumption amount: 1 pack a day  Dicsussion on advise to quit smoking and smoking impacts: Lung and cardiovascular  Patient's willingness to quit: Not wishing to quit  Methods to quit smoking discussed: Not engaged  Medication management of smoking session drugs discussed: Not engaged    Setting quit date not established  Follow-up arranged 5 months   Time spent counseling the patient: 5 minutes       Other Visit Diagnoses     Gastroesophageal reflux disease without esophagitis       Relevant Medications   omeprazole (PRILOSEC) 20 MG capsule   Other Relevant Orders   CBC with Differential/Platelet    Encounter for health-related screening       Relevant Orders   Comprehensive metabolic panel   CBC with Differential/Platelet   Hemoglobin A1c      Meds ordered this encounter  Medications   omeprazole (PRILOSEC) 20 MG capsule    Sig: Take 1 capsule (20 mg total) by mouth daily.    Dispense:  90 capsule    Refill:  0    future   cyclobenzaprine (FLEXERIL) 10 MG tablet    Sig: Take 1 tablet (10 mg total) by mouth 3 (three) times daily as needed for muscle spasms.    Dispense:  30 tablet    Refill:  2   Follow-up: Return in about 5 months (around 01/27/2023) for chronic pain, chronic conditions, hyperlipidemia.

## 2022-08-29 ENCOUNTER — Ambulatory Visit: Payer: Medicaid Other | Attending: Critical Care Medicine | Admitting: Critical Care Medicine

## 2022-08-29 ENCOUNTER — Other Ambulatory Visit (HOSPITAL_COMMUNITY): Payer: Self-pay

## 2022-08-29 ENCOUNTER — Encounter: Payer: Self-pay | Admitting: Critical Care Medicine

## 2022-08-29 VITALS — BP 123/78 | HR 72 | Ht 66.5 in | Wt 174.4 lb

## 2022-08-29 DIAGNOSIS — F17210 Nicotine dependence, cigarettes, uncomplicated: Secondary | ICD-10-CM | POA: Insufficient documentation

## 2022-08-29 DIAGNOSIS — E559 Vitamin D deficiency, unspecified: Secondary | ICD-10-CM

## 2022-08-29 DIAGNOSIS — Z760 Encounter for issue of repeat prescription: Secondary | ICD-10-CM | POA: Insufficient documentation

## 2022-08-29 DIAGNOSIS — F319 Bipolar disorder, unspecified: Secondary | ICD-10-CM | POA: Diagnosis not present

## 2022-08-29 DIAGNOSIS — M7502 Adhesive capsulitis of left shoulder: Secondary | ICD-10-CM

## 2022-08-29 DIAGNOSIS — Z79624 Long term (current) use of inhibitors of nucleotide synthesis: Secondary | ICD-10-CM | POA: Insufficient documentation

## 2022-08-29 DIAGNOSIS — Z79899 Other long term (current) drug therapy: Secondary | ICD-10-CM | POA: Diagnosis not present

## 2022-08-29 DIAGNOSIS — F1290 Cannabis use, unspecified, uncomplicated: Secondary | ICD-10-CM | POA: Diagnosis not present

## 2022-08-29 DIAGNOSIS — E782 Mixed hyperlipidemia: Secondary | ICD-10-CM | POA: Insufficient documentation

## 2022-08-29 DIAGNOSIS — J439 Emphysema, unspecified: Secondary | ICD-10-CM | POA: Diagnosis not present

## 2022-08-29 DIAGNOSIS — Z8541 Personal history of malignant neoplasm of cervix uteri: Secondary | ICD-10-CM | POA: Diagnosis not present

## 2022-08-29 DIAGNOSIS — F17200 Nicotine dependence, unspecified, uncomplicated: Secondary | ICD-10-CM

## 2022-08-29 DIAGNOSIS — M542 Cervicalgia: Secondary | ICD-10-CM | POA: Diagnosis not present

## 2022-08-29 DIAGNOSIS — Z139 Encounter for screening, unspecified: Secondary | ICD-10-CM | POA: Diagnosis not present

## 2022-08-29 DIAGNOSIS — E785 Hyperlipidemia, unspecified: Secondary | ICD-10-CM | POA: Diagnosis not present

## 2022-08-29 DIAGNOSIS — Z803 Family history of malignant neoplasm of breast: Secondary | ICD-10-CM | POA: Diagnosis not present

## 2022-08-29 DIAGNOSIS — K219 Gastro-esophageal reflux disease without esophagitis: Secondary | ICD-10-CM

## 2022-08-29 DIAGNOSIS — G8929 Other chronic pain: Secondary | ICD-10-CM | POA: Insufficient documentation

## 2022-08-29 DIAGNOSIS — R042 Hemoptysis: Secondary | ICD-10-CM | POA: Diagnosis not present

## 2022-08-29 MED ORDER — OMEPRAZOLE 20 MG PO CPDR
20.0000 mg | DELAYED_RELEASE_CAPSULE | Freq: Every day | ORAL | 0 refills | Status: DC
  Filled 2022-08-29 – 2022-11-21 (×2): qty 90, 90d supply, fill #0

## 2022-08-29 MED ORDER — CYCLOBENZAPRINE HCL 10 MG PO TABS
10.0000 mg | ORAL_TABLET | Freq: Three times a day (TID) | ORAL | 2 refills | Status: DC | PRN
  Filled 2022-08-29 – 2022-09-08 (×2): qty 30, 10d supply, fill #0

## 2022-08-29 NOTE — Patient Instructions (Signed)
Blood pressure is normal No change in medication You have plenty of refills on all medications Continue with pain management  We discussed you need to proceed with surgery on left shoulder Return Dr Joya Gaskins 5 months

## 2022-08-29 NOTE — Assessment & Plan Note (Signed)
Follow-up mammogram this April last year was normal

## 2022-08-29 NOTE — Assessment & Plan Note (Signed)
Patient maintains current medications per psychiatry

## 2022-08-29 NOTE — Assessment & Plan Note (Signed)
Will refill pravastatin when she gets money

## 2022-08-29 NOTE — Assessment & Plan Note (Addendum)
Patient does not engage around smoking cessation    Current smoking consumption amount: 1 pack a day  Dicsussion on advise to quit smoking and smoking impacts: Lung and cardiovascular  Patient's willingness to quit: Not wishing to quit  Methods to quit smoking discussed: Not engaged  Medication management of smoking session drugs discussed: Not engaged    Setting quit date not established  Follow-up arranged 5 months   Time spent counseling the patient: 5 minutes

## 2022-08-29 NOTE — Assessment & Plan Note (Signed)
Will refill once she gets finances

## 2022-08-29 NOTE — Assessment & Plan Note (Signed)
Because of chronic pain patient is going to proceed with surgery

## 2022-08-30 ENCOUNTER — Telehealth: Payer: Self-pay

## 2022-08-30 LAB — CBC WITH DIFFERENTIAL/PLATELET
Basophils Absolute: 0.1 10*3/uL (ref 0.0–0.2)
Basos: 1 %
EOS (ABSOLUTE): 0.1 10*3/uL (ref 0.0–0.4)
Eos: 2 %
Hematocrit: 41.4 % (ref 34.0–46.6)
Hemoglobin: 13.9 g/dL (ref 11.1–15.9)
Immature Grans (Abs): 0 10*3/uL (ref 0.0–0.1)
Immature Granulocytes: 0 %
Lymphocytes Absolute: 3.7 10*3/uL — ABNORMAL HIGH (ref 0.7–3.1)
Lymphs: 51 %
MCH: 32 pg (ref 26.6–33.0)
MCHC: 33.6 g/dL (ref 31.5–35.7)
MCV: 95 fL (ref 79–97)
Monocytes Absolute: 0.4 10*3/uL (ref 0.1–0.9)
Monocytes: 6 %
Neutrophils Absolute: 3 10*3/uL (ref 1.4–7.0)
Neutrophils: 40 %
Platelets: 382 10*3/uL (ref 150–450)
RBC: 4.34 x10E6/uL (ref 3.77–5.28)
RDW: 12.3 % (ref 11.7–15.4)
WBC: 7.4 10*3/uL (ref 3.4–10.8)

## 2022-08-30 LAB — COMPREHENSIVE METABOLIC PANEL
ALT: 11 IU/L (ref 0–32)
AST: 13 IU/L (ref 0–40)
Albumin/Globulin Ratio: 2 (ref 1.2–2.2)
Albumin: 4.6 g/dL (ref 3.8–4.9)
Alkaline Phosphatase: 125 IU/L — ABNORMAL HIGH (ref 44–121)
BUN/Creatinine Ratio: 9 (ref 9–23)
BUN: 10 mg/dL (ref 6–24)
Bilirubin Total: 0.3 mg/dL (ref 0.0–1.2)
CO2: 21 mmol/L (ref 20–29)
Calcium: 9.4 mg/dL (ref 8.7–10.2)
Chloride: 101 mmol/L (ref 96–106)
Creatinine, Ser: 1.17 mg/dL — ABNORMAL HIGH (ref 0.57–1.00)
Globulin, Total: 2.3 g/dL (ref 1.5–4.5)
Glucose: 110 mg/dL — ABNORMAL HIGH (ref 70–99)
Potassium: 4.4 mmol/L (ref 3.5–5.2)
Sodium: 140 mmol/L (ref 134–144)
Total Protein: 6.9 g/dL (ref 6.0–8.5)
eGFR: 55 mL/min/{1.73_m2} — ABNORMAL LOW (ref >59)

## 2022-08-30 LAB — HEMOGLOBIN A1C
Est. average glucose Bld gHb Est-mCnc: 120 mg/dL
Hgb A1c MFr Bld: 5.8 % — ABNORMAL HIGH (ref 4.8–5.6)

## 2022-08-30 LAB — LIPID PANEL
Chol/HDL Ratio: 3.2 ratio (ref 0.0–4.4)
Cholesterol, Total: 210 mg/dL — ABNORMAL HIGH (ref 100–199)
HDL: 65 mg/dL (ref >39)
LDL Chol Calc (NIH): 131 mg/dL — ABNORMAL HIGH (ref 0–99)
Triglycerides: 78 mg/dL (ref 0–149)
VLDL Cholesterol Cal: 14 mg/dL (ref 5–40)

## 2022-08-30 NOTE — Telephone Encounter (Signed)
-----   Message from Elsie Stain, MD sent at 08/30/2022  6:13 AM EST ----- Let pt know all labs normal except high cholesterol she should stay on pravastatin

## 2022-08-30 NOTE — Telephone Encounter (Signed)
Pt was called and vm was left, Information has been sent to nurse pool.   

## 2022-08-30 NOTE — Progress Notes (Signed)
Let pt know all labs normal except high cholesterol she should stay on pravastatin

## 2022-09-05 ENCOUNTER — Other Ambulatory Visit (HOSPITAL_COMMUNITY): Payer: Self-pay

## 2022-09-06 ENCOUNTER — Ambulatory Visit: Payer: Medicaid Other | Admitting: Surgical

## 2022-09-06 ENCOUNTER — Other Ambulatory Visit (HOSPITAL_COMMUNITY): Payer: Self-pay

## 2022-09-06 DIAGNOSIS — M545 Low back pain, unspecified: Secondary | ICD-10-CM | POA: Diagnosis not present

## 2022-09-06 DIAGNOSIS — G894 Chronic pain syndrome: Secondary | ICD-10-CM | POA: Diagnosis not present

## 2022-09-06 DIAGNOSIS — M75102 Unspecified rotator cuff tear or rupture of left shoulder, not specified as traumatic: Secondary | ICD-10-CM | POA: Diagnosis not present

## 2022-09-06 DIAGNOSIS — M1990 Unspecified osteoarthritis, unspecified site: Secondary | ICD-10-CM | POA: Diagnosis not present

## 2022-09-06 DIAGNOSIS — M75112 Incomplete rotator cuff tear or rupture of left shoulder, not specified as traumatic: Secondary | ICD-10-CM | POA: Diagnosis not present

## 2022-09-07 ENCOUNTER — Other Ambulatory Visit (HOSPITAL_COMMUNITY): Payer: Self-pay

## 2022-09-08 ENCOUNTER — Other Ambulatory Visit (HOSPITAL_COMMUNITY): Payer: Self-pay

## 2022-09-13 ENCOUNTER — Other Ambulatory Visit: Payer: Self-pay | Admitting: Critical Care Medicine

## 2022-09-13 ENCOUNTER — Ambulatory Visit (INDEPENDENT_AMBULATORY_CARE_PROVIDER_SITE_OTHER): Payer: Medicaid Other | Admitting: Surgical

## 2022-09-13 ENCOUNTER — Encounter: Payer: Self-pay | Admitting: Surgical

## 2022-09-13 DIAGNOSIS — M75112 Incomplete rotator cuff tear or rupture of left shoulder, not specified as traumatic: Secondary | ICD-10-CM | POA: Diagnosis not present

## 2022-09-13 DIAGNOSIS — Z1231 Encounter for screening mammogram for malignant neoplasm of breast: Secondary | ICD-10-CM

## 2022-09-16 ENCOUNTER — Encounter: Payer: Self-pay | Admitting: Surgical

## 2022-09-16 NOTE — Progress Notes (Signed)
Follow-up Office Visit Note   Patient: Katie Sherman           Date of Birth: [birth year]           MRN: [id number] Visit Date: 09/13/2022 Requested by: Elsie Stain, MD [address] [place],  [place] [address] PCP: Elsie Stain, MD  Subjective: Chief Complaint  Patient presents with   Left Shoulder - Pain, Follow-up    HPI: Katie Sherman is a 57 y.o. female who returns to the office for follow-up visit.    Plan at last visit was: Katie Sherman is a 57 y.o. female who presents to the office for review of left shoulder MRI.  MRI demonstrates significant tendinosis of the supraspinatus with articular surface tear.  There is a high-grade partial-thickness tear that encompasses a fairly significant amount of the tendon but it is not full-thickness.  No significant bicep tendon pathology and she has a little bit of partial-thickness cartilage loss of the glenohumeral joint.  Went over these images with her today and discussed options available to patient which mainly would be physical therapy versus ultrasound-guided glenohumeral injection versus surgical intervention in the form of rotator cuff repair with bicep tenodesis.  After discussion of options including what to expect from surgery as well as the recovery timeframe, she would like to think over her options and she will contact the office to discuss further steps.   Since then, patient notes continued pain without any significant change.  She has no history of prior surgery to the shoulder.  She denies any cardiac history, blood thinner use, diabetes, alcohol use.  She does smoke 1 pack/day.  Her social situation is also complicated by the fact that she lives in her car.  She does have a daughter that would allow her to stay in her house for postoperative recovery.  She is also working on [employer] and has a hearing at the end of the month where she expects that she will be approved and she will be able to  find her own housing at that point.  She takes 800 mg ibuprofen as well as Percocet 10 mg for pain control.  She is in pain management.              ROS: All systems reviewed are negative as they relate to the chief complaint within the history of present illness.  Patient denies fevers or chills.  Assessment & Plan: Visit Diagnoses:  1. Incomplete rotator cuff tear or rupture of left shoulder, not specified as traumatic     Plan: Katie Sherman is a 57 y.o. female who returns to the office for follow-up visit for left shoulder pain.  Plan from last visit was noted above in HPI.  They now return with continued left shoulder pain.  She states that she has had shoulder pain for several years with prior injections into the joint that have not really provided any significant lasting relief for her.  She has previous MRI demonstrating severe thinning of the supraspinatus tendon with partial-thickness articular sided tear at does not extend into full-thickness tear but does have very wide spanning thinning of the tendon.  She has weakness on exam today with stressing supraspinatus and infraspinatus along with reproduction of pain with these maneuvers.  She has pain at night that wakes her up from sleep and she has thought about her options over the last several months and would like to proceed with  surgery.  Her recovery situation is not ideal given the fact that she lives in her car and she is in pain management.  She would like to proceed regardless as previous injections have not provided any lasting relief for her.  She states she should be able to stay with her daughter who she has spoken with for recovery from shoulder surgery over the for several months.  She is also working on obtaining her own housing.  We discussed rotator cuff surgery in depth today discussing the risks and benefits of the procedure including but not limited to the risk of nerve/blood vessel damage, shoulder stiffness, need for  revision surgery in the future, infection, medical complication from surgery, continued weakness and pain.  She understands the recovery timeframe is several months and can take 9 to 12 months to see full effect from the surgery, especially in someone who has a history of smoking and chronic opioid use.  She also understands that this is going to be a very painful surgery given her chronic opioid use and the nature of rotator cuff surgery.  She would still like to proceed with surgery in the form of left shoulder arthroscopy with mini open bicep tenodesis and rotator cuff tear repair in April.  We will post her for surgery and follow-up after procedure.  I recommended that she do her best in order to limit her smoking leading up to surgery and throughout her recovery.  Follow-Up Instructions: No follow-ups on file.   Orders:  No orders of the defined types were placed in this encounter.  No orders of the defined types were placed in this encounter.     Procedures: No procedures performed   Clinical Data: No additional findings.  Objective: Vital Signs: LMP 04/29/2012   Physical Exam:  Constitutional: Patient appears well-developed HEENT:  Head: Normocephalic Eyes:EOM are normal Neck: Normal range of motion Cardiovascular: Normal rate Pulmonary/chest: Effort normal Neurologic: Patient is alert Skin: Skin is warm Psychiatric: Patient has normal mood and affect  Ortho Exam: Ortho exam demonstrates left shoulder with 40 degrees X rotation, 90 degrees abduction, 120 degrees forward elevation passively and actively.  She has 5 -/5 supraspinatus and infraspinatus strength with reproduction of the majority of her pain when testing the strength.  She has 5/5 subscapularis strength.  Tenderness over the bicipital groove.  No tenderness over the [facility] joint.  5/5 motor strength of bilateral grip strength, finger abduction, pronation/supination, bicep, tricep, deltoid.  Axillary nerve is intact  with deltoid firing.  She has no cellulitis or skin changes noted overlying the left shoulder.  Specialty Comments:  No specialty comments available.  Imaging: No results found.   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   Postmenopausal 01/22/2017   Bipolar 1 disorder, depressed ([place]) 12/29/2016   Family history of suicide 11/20/2016   Depression 11/20/2016   Family history of breast cancer 11/20/2016   Adhesive capsulitis of left shoulder 11/19/2016   Greater trochanteric bursitis of right hip 11/19/2016   Past Medical History:  Diagnosis Date   Bilateral carpal tunnel syndrome 03/19/2018   Cervical cancer ([place])    Cervical cancer ([place])    Hemoptysis 01/11/2021  History of exercise stress test    ETT-Echo 5/18: normal EF, no ischemia   History of loop recorder    currently on person   Kidney stone    Pharyngeal dysphagia 01/11/2021    Family History  Problem Relation Age of Onset   Heart attack Mother 64   Heart disease Father    Heart attack Father 62   Liver cancer Father    Bone cancer Other    Breast cancer Maternal Aunt        Diagnosed in 53's   Bone cancer Maternal Aunt     Past Surgical History:  Procedure Laterality Date   CERVICAL CONE BIOPSY     TUBAL LIGATION     Social History   Occupational History   Occupation: unemployed  Tobacco Use   Smoking status: Every Day    Packs/day: 0.50    Years: 36.00    Total pack years: 18.00    Types: Cigarettes    Start date: 1983   Smokeless tobacco: Never   Tobacco comments:    Smokes 1 pack a day [facility] 01/20/21  Vaping Use   Vaping Use: Never used  Substance and  Sexual Activity   Alcohol use: No   Drug use: Yes    Types: Marijuana    Comment: stopped 04/06/2017   Sexual activity: Not Currently    Birth control/protection: None

## 2022-09-25 ENCOUNTER — Other Ambulatory Visit (HOSPITAL_COMMUNITY): Payer: Self-pay

## 2022-09-25 ENCOUNTER — Telehealth (HOSPITAL_BASED_OUTPATIENT_CLINIC_OR_DEPARTMENT_OTHER): Payer: Medicaid Other | Admitting: Psychiatry

## 2022-09-25 ENCOUNTER — Encounter (HOSPITAL_COMMUNITY): Payer: Self-pay | Admitting: Psychiatry

## 2022-09-25 ENCOUNTER — Encounter: Payer: Self-pay | Admitting: Critical Care Medicine

## 2022-09-25 DIAGNOSIS — F319 Bipolar disorder, unspecified: Secondary | ICD-10-CM | POA: Diagnosis not present

## 2022-09-25 DIAGNOSIS — F4310 Post-traumatic stress disorder, unspecified: Secondary | ICD-10-CM

## 2022-09-25 HISTORY — DX: Post-traumatic stress disorder, unspecified: F43.10

## 2022-09-25 MED ORDER — QUETIAPINE FUMARATE 200 MG PO TABS
200.0000 mg | ORAL_TABLET | Freq: Every day | ORAL | 2 refills | Status: DC
  Filled 2022-09-25: qty 30, 30d supply, fill #0

## 2022-09-25 MED ORDER — LAMOTRIGINE 100 MG PO TABS
300.0000 mg | ORAL_TABLET | Freq: Every day | ORAL | 2 refills | Status: DC
  Filled 2022-09-25 – 2022-10-19 (×2): qty 90, 30d supply, fill #0
  Filled 2022-12-01: qty 90, 30d supply, fill #1

## 2022-09-25 MED ORDER — TRAZODONE HCL 50 MG PO TABS
50.0000 mg | ORAL_TABLET | Freq: Every evening | ORAL | 2 refills | Status: DC | PRN
  Filled 2022-09-25 – 2023-01-16 (×2): qty 30, 30d supply, fill #0
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
## 2022-09-27 ENCOUNTER — Ambulatory Visit: Payer: Medicaid Other | Admitting: Critical Care Medicine

## 2022-09-27 ENCOUNTER — Telehealth: Payer: Self-pay

## 2022-09-27 NOTE — Telephone Encounter (Signed)
Completed residual functional capacity questionnaire emailed to [facility]

## 2022-10-02 DIAGNOSIS — M1990 Unspecified osteoarthritis, unspecified site: Secondary | ICD-10-CM | POA: Diagnosis not present

## 2022-10-02 DIAGNOSIS — M75112 Incomplete rotator cuff tear or rupture of left shoulder, not specified as traumatic: Secondary | ICD-10-CM | POA: Diagnosis not present

## 2022-10-02 DIAGNOSIS — G894 Chronic pain syndrome: Secondary | ICD-10-CM | POA: Diagnosis not present

## 2022-10-02 DIAGNOSIS — M75102 Unspecified rotator cuff tear or rupture of left shoulder, not specified as traumatic: Secondary | ICD-10-CM | POA: Diagnosis not present

## 2022-10-02 DIAGNOSIS — Z79891 Long term (current) use of opiate analgesic: Secondary | ICD-10-CM | POA: Diagnosis not present

## 2022-10-04 ENCOUNTER — Other Ambulatory Visit (HOSPITAL_COMMUNITY): Payer: Self-pay

## 2022-10-05 ENCOUNTER — Other Ambulatory Visit (HOSPITAL_COMMUNITY): Payer: Self-pay

## 2022-10-05 ENCOUNTER — Ambulatory Visit: Payer: Medicaid Other | Admitting: Critical Care Medicine

## 2022-10-05 NOTE — Progress Notes (Deleted)
Established Patient Office Visit  Subjective:  Patient ID: Katie Sherman, female    DOB: 1966-02-26  Age: 57 y.o. MRN: [id number]  CC: Chronic pain in the left side of the neck and clavicle  HPI  09/05/21 Katie Sherman presents for primary care to establish and follow-up of chronic condition of the chest and neck. Below are series of the previous documentations I have on this patient when I have seen her 1 time only in July 2022 for pulmonary consultation by her patient's primary care provider Dr. Wynetta Emery.  Subsequent to this the patient was cared for by pulmonary medicine Dr. Lamonte Sakai and thoracic surgery Dr. Kipp Brood and was seen 1 time by infectious disease Dr. Linus Salmons.  01/11/21 This a 57 year old female referred for hemoptysis difficulty swallowing and neck fullness, neck pain and supraclavicular fullness bilaterally of 4 weeks duration.  The patient's primary care provider is Dr. Wynetta Emery.  The patient's hemoptysis is pure blood with clots not mixed with mucus.  She had an episode of minimal hemoptysis a year ago last summer in 2021.  This went away she then developed COVID to beginning portion of 2022 recovered from this.  Patient began having neck swelling and pain 4 weeks ago and presented for evaluations both in the clinic in the emergency room.  Evaluations were not revealing.   At that time she was not having hemoptysis but over the past week she has had increased coughing up of blood combination with progressive swelling in the neck in the supraclavicular areas.  She is also having difficulty with swallowing as well.  Patient does smoke a pack a day of cigarettes and is under a lot of stress unable to reduce her tobacco intake.  She does have some dyspnea on exertion but no wheezing.  She is never been diagnosed with pneumonia.  She did have a CT of the chest in February of this year showing some patchy basilar groundglass changes.  She has also chronic subpleural nodules which have been  unchanged compared to previous exam in 2021 versus February 2022.  The patient's been out of work the last 2 weeks because of the difficulty she is now experiencing.   Patient's past medical history significant for cervical cancer in her self and family history of breast cancer.  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   Note ED visit 01/01/21 Katie Sherman is a 57 y.o. female with past medical history of tobacco use, cervical cancer.  Patient presents to the emergency department with a chief complaint of swelling and tenderness to her neck.  Patient reports that her symptoms started on Wednesday and were located on the left side of her neck.  Patient also endorsed that she was having some difficulty and pain when swallowing liquids but "only on the left side of my throat."  She reports that Thursday morning swelling and pain wear to both sides of her throat.  Reports progressive worsening of symptoms on Friday.  Patient reports that she tried to take your prescribed Acacian Friday morning but it "felt like my throat was closing up.".  States that it felt like one of her pills had become stuck in her throat.  Patient reports that she "stuck a wooden spoon down my throat," in an attempt to push the pill further  along.  Patient reports that she had an episode of vomiting after this.     At present patient reports that pain is only located to the right side of her neck.  Patient rates her pain 10/10 on the pain scale.  Patient reports that pain is worse with movement and touch.  Patient reports that she has had some relief with ibuprofen earlier in the week but has not taken any today.    08/26/21 phone note I attempted to call the patient to inform her about her MRI results. Note there was a  phone virtual visit with Dr Kipp Brood who spoke to the patient around 2pm today but due to high ED volume the MRI had yet to read at that point. I have received two very long MyChart messages from the patient this afternoon expressing her frustrations        I spoke to Dr Camie Patience, Chief of Radiology [facility] on the results  He and I reviewed all prior imaging to date.   The process appears to have started behind the sternoclavicular joint and first rib at the cartilaginous junction posteriorly.  It has significantly regressed and all the reactive mediastinal cervical and axillary adenopathy has disappeared   There is no cervical spine pathology.  The interna Jugular is patent .  As this was a combined MRI MRA study all great vessels to and from the heart are NORMAL   This does NOT represent Cancer    DR Lin Landsman had an interesting hypothesis, what if this were from some type of costochondritis that resulted in cartilaginous fracture and bleeding in the past of summer 2022.   The pt steadfastly denies trauma or IVDU .      In any case it is not cancer but likely inflammatory.   I have tried to call the patient multiple times.   She is living with a friend off [address].     I have left two phone msg's on the patients cell  there is no answer,  I have tried to call at least 8 times between 4p and now to no avail   I am copying this phone note with Drs Lamonte Sakai and Kipp Brood.  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  She is well aware it at about the current MRI as she had a visit with Dr. Kipp Brood over the phone he explained the current findings to her.  She was not available when I tried to call her 2 weeks ago.  She agrees today to become primary care under my coverage as well as follow-up here for her pulmonary status.  She states she is not having swallowing difficulty as she was before but has a  burning sensation on the back of her neck she has bad dentition.  She feels a squeezing sensation in the chest neck area and pain in the left clavicle.  She also notes a slight increase swelling in the right side of the neck as well.  She has left-sided shoulder pain as well.  She states she did fall early in the spring down her stairs and tried to grab with her left hand she does not recall specifically injuring her shoulder at that visit.  Patient subsequent developed acute onset of the symptoms in the neck in June 2022.  She then saw her primary care provider who then referred her to me.  See my July  2022 note.  At that point we ordered imaging of the neck with a CT scan.  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  And other findings of mass effect behind the left clavicle and also involvement of the tip of the first rib on the left and mediastinal adenopathy and mass effect under the chest wall.  Serial imagings over a period of time including PET scan and 2 other MRIs showed the mass lesion is reducing in size and the most recent MRI in February of this year showed marked reduction of all areas of involvement.  Patient denies any infectious disease history she denies any IV drug use.  No history of tuberculosis.  No fever chills or sweats.  Her weight is down somewhat.  She has not had any systemic blood draws.  Patient denies any further hemoptysis this only occurred in the summer 2022.  She is requesting a refill on a nitro drip patch which helped with bursitis of the right hip which she got from orthopedics in 2019.  She also wants her acyclovir refilled pill.  She would like refills on her chronic pain medicine.  She convened today with me today to see if any additional imaging studies can be performed and other evaluations be performed for this patient.  10/03/21   Patient seen in return visit and states she still having some posterior neck pain but the shoulder pain and  sternoclavicular pain has nearly resolved.  She has complained of a new problem and that she cracked a molar in her jaw and has been taking some of the oxycodone for the jaw pain.  She states the indomethacin has helped to some degree but is causing GI upset.  Patient is in need of a mammogram and colon cancer screening and agrees to receive these.  On arrival blood pressure is good 117/81.  The patient still smokes a pack a day of cigarettes.  She also needs a Pap smear.  She has upcoming in April and May Social Security determination assessments for disability with orthopedics and psychiatry.  8/1 This is a return visit for this 57 year old female with chronic pain in the left shoulder left clavicular area and left neck.  She went to the emergency room on 25 July with complaints of progressive pain.  CT imaging of the neck and chest were unremarkable for swelling or other abnormalities.  All previous abnormalities from 2020 to have now resolved.  Patient yet is still requesting more Percocets.  She needs to sign a chronic pain management pain control contract at this time since she is still requiring Percocets.  Patient now is completely homeless living in her car for the past 2 months this is exacerbated some of her chronic pain in her hip neck and back as she has to sleep in the car.  On arrival blood pressure 114/78.  Patient does admit to using cannabis. The patient does not need Pap smears she has had a complete cold knife conization and negative Pap smear in 2018 was told did not need further.  Patient has been seen by orthopedics in the past may yet need another referral.  Patient remains without any insurance at this time.  She is failed to achieve disability on several attempts.  10/3 Patient seen by way of a phone visit follow-up from August 1.  She is still living in her car and stays at a friend's home is able to go in and shower wash close and occasionally  take a nap in large lives in her  [employer].  She is having car trouble today could not make it to the appointment.  Pain is mostly in the shoulder.  She is able to get by by taking 1 Percocet daily.  She just had a 50 count given on 20 September.  She has a pending pain management appointment in [place].  She also needs dental appointment upcoming for tooth extraction.  She is followed up with her mental health doctor who is represcribed her medications.  She cannot afford her co-pay at this time and is trying to place these on an account.  She has no other complaints at this visit.   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  This is likely the cause of the patient's chronic left neck clavicle and shoulder pain.  In retrospect the mass in the chest was likely hemorrhage from a fall and trauma.  She fell down stairs and caught her left arm on the rail.  This is likely the long-term cause of the patient's conditions and imaging studies.  Patient now is going to a pain clinic on: [address].  She denies any further swallowing difficulty she has occasional dizziness and nausea.  The patient is not able to get her acyclovir or pravastatin due to lack of money to pay for the co-pay she does have all her mental health medicines and her pain medication.  Blood pressure initially elevated on arrival 144/87 on recheck it is 123/78.  She was in a rush to get here today  Below is documentation of recent orthopedic visit Katie Sherman is a 57 y.o. female who presents to the office for review of left shoulder MRI.  MRI demonstrates significant tendinosis of the supraspinatus with articular surface tear.  There is a high-grade partial-thickness tear that encompasses a fairly significant amount of the tendon but it is not full-thickness.  No significant bicep tendon pathology and she has a little bit of partial-thickness cartilage loss of the glenohumeral joint.  Went  over these images with her today and discussed options available to patient which mainly would be physical therapy versus ultrasound-guided glenohumeral injection versus surgical intervention in the form of rotator cuff repair with bicep tenodesis.  After discussion of options including what to expect from surgery as well as the recovery timeframe, she would like to think over her options and she will contact the office to discuss further steps.    Patient is edentulous and is waiting on dentures.  Patient still smoking a pack a day of cigarettes.  She declines a flu vaccine.  She needs colon cancer screening but cannot follow through on colonoscopy at this time.  The patient was not able to process the fecal occult kit given last year. The patient is still having to live in her [place] automobile   10/05/22  Past Medical History:  Diagnosis Date   Bilateral carpal tunnel syndrome 03/19/2018   Cervical cancer (HCC)    Cervical cancer ([place])    Hemoptysis 01/11/2021   History of exercise stress test    ETT-Echo 5/18: normal EF, no ischemia   History of loop recorder    currently on person   Kidney stone    Pharyngeal dysphagia 01/11/2021    Past Surgical History:  Procedure Laterality Date   CERVICAL CONE BIOPSY     TUBAL LIGATION      Family  History  Problem Relation Age of Onset   Heart attack Mother 63   Heart disease Father    Heart attack Father 23   Liver cancer Father    Bone cancer Other    Breast cancer Maternal Aunt        Diagnosed in 67's   Bone cancer Maternal Aunt     Social History   Socioeconomic History   Marital status: Widowed    Spouse name: Not on file   Number of children: Not on file   Years of education: Not on file   Highest education level: Not on file  Occupational History   Occupation: unemployed  Tobacco Use   Smoking status: Every Day    Packs/day: 0.50    Years: 36.00    Additional pack years: 0.00    Total pack years: 18.00     Types: Cigarettes    Start date: 46   Smokeless tobacco: Never   Tobacco comments:    Smokes 1 pack a day ARJ 01/20/21  Vaping Use   Vaping Use: Never used  Substance and Sexual Activity   Alcohol use: No   Drug use: Yes    Types: Marijuana    Comment: stopped 04/06/2017   Sexual activity: Not Currently    Birth control/protection: None  Other Topics Concern   Not on file  Social History Narrative   Prior traffic control specialist - unemployed now   Widow   5 children, 10 grandchildren   Moved here from [place] 2013   Social Determinants of Health   Financial Resource Strain: Not on file  Food Insecurity: Not on file  Transportation Needs: Not on file  Physical Activity: Not on file  Stress: Not on file  Social Connections: Not on file  Intimate Partner Violence: Not on file    Outpatient Medications Prior to Visit  Medication Sig Dispense Refill   albuterol (VENTOLIN HFA) 108 (90 Base) MCG/ACT inhaler Inhale 2 puffs into the lungs every 6 (six) hours as needed for wheezing or shortness of breath. (Patient not taking: Reported on 08/29/2022) 6.7 g 2   chlorhexidine (PERIDEX) 0.12 % solution Rinse mouth with 15 mLs (1 capful) for 30 seconds in the morning and in the evening after toothbrushing. then SPIT after rinsing, DO NOT SWALLOW (Patient not taking: Reported on 08/29/2022) 473 mL 2   cyclobenzaprine (FLEXERIL) 10 MG tablet Take 1 tablet (10 mg total) by mouth 3 (three) times daily as needed for muscle spasms. 30 tablet 2   ibuprofen (ADVIL) 600 MG tablet Take 1 tablet (600 mg total) by mouth every 8 (eight) hours as needed. (Patient not taking: Reported on 08/29/2022) 90 tablet 2   ibuprofen (IBU) 800 MG tablet Take 1 tablet (800 mg total) by mouth every 6-8 hours as needed. (Patient not taking: Reported on 08/29/2022) 20 tablet 0   lamoTRIgine (LAMICTAL) 100 MG tablet Take 3 tablets (300 mg total) by mouth daily. 90 tablet 2   naloxone (NARCAN) nasal spray 4 mg/0.1 mL Use  in case of accidental overdose (Patient not taking: Reported on 08/29/2022) 2 each 0   nitroGLYCERIN (NITRODUR - DOSED IN MG/24 HR) 0.2 mg/hr patch Apply 1/4 patch daily to the affected area (Patient not taking: Reported on 08/29/2022) 30 patch 1   omeprazole (PRILOSEC) 20 MG capsule Take 1 capsule (20 mg total) by mouth daily. 90 capsule 0   oxyCODONE-acetaminophen (PERCOCET) 10-325 MG tablet Take 1 tablet by mouth every 8 (eight) hours as  needed for pain. (Patient not taking: Reported on 08/29/2022) 60 tablet 0   pravastatin (PRAVACHOL) 40 MG tablet Take 1 tablet (40 mg total) by mouth daily. (Patient not taking: Reported on 08/29/2022) 30 tablet 3   QUEtiapine (SEROQUEL) 200 MG tablet Take 1 tablet (200 mg total) by mouth at bedtime. 30 tablet 2   traZODone (DESYREL) 50 MG tablet Take 1 tablet (50 mg total) by mouth at bedtime as needed for sleep. 30 tablet 2   valACYclovir (VALTREX) 500 MG tablet Take 1 tablet (500 mg total) by mouth daily. (Patient not taking: Reported on 08/29/2022) 60 tablet 1   Vitamin D, Ergocalciferol, (DRISDOL) 1.25 MG (50000 UNIT) CAPS capsule Take 1 capsule (50,000 Units total) by mouth every 7 (seven) days. (Patient not taking: Reported on 08/29/2022) 5 capsule 5   No facility-administered medications prior to visit.    No Known Allergies  ROS Review of Systems  Constitutional:  Negative for chills, diaphoresis, fatigue, fever and unexpected weight change.  HENT:  Negative for dental problem, ear pain, postnasal drip, rhinorrhea, sinus pressure, sore throat, trouble swallowing and voice change.   Eyes: Negative.   Respiratory: Negative.  Negative for apnea, cough, choking, chest tightness, shortness of breath, wheezing and stridor.        No further hemoptysis  Cardiovascular:  Negative for chest pain, palpitations and leg swelling.  Gastrointestinal: Negative.  Negative for abdominal distention, abdominal pain, nausea and vomiting.  Genitourinary: Negative.    Musculoskeletal:  Positive for neck pain. Negative for arthralgias, myalgias and neck stiffness.       Left acromioclavicular joint pain  Skin: Negative.  Negative for rash.  Allergic/Immunologic: Negative.  Negative for environmental allergies and food allergies.  Neurological: Negative.  Negative for dizziness, syncope, weakness and headaches.  Hematological: Negative.  Negative for adenopathy. Does not bruise/bleed easily.  Psychiatric/Behavioral:  Negative for agitation, dysphoric mood and sleep disturbance. The patient is not nervous/anxious.       Objective:    There were no vitals filed for this visit.   Gen: Pleasant, well-nourished, in no distress,  normal affect  ENT: No lesions,  mouth clear,  oropharynx clear, no postnasal drip patient is edentulous  Neck: No JVD, no TMG, no carotid bruits  Lungs: No use of accessory muscles, no dullness to percussion, clear without rales or rhonchi  Cardiovascular: RRR, heart sounds normal, no murmur or gallops, no peripheral edema  Abdomen: soft and NT, no HSM,  BS normal  Musculoskeletal: No deformities, no cyanosis or clubbing, tenderness left acromioclavicular joint with decreased range of motion  Neuro: alert, non focal  Skin: Warm, no lesions or rashes     LMP 04/29/2012  Wt Readings from Last 3 Encounters:  08/29/22 174 lb 6.4 oz (79.1 kg)  03/08/22 180 lb (81.6 kg)  02/07/22 183 lb (83 kg)     Health Maintenance Due  Topic Date Due   COLON CANCER SCREENING ANNUAL FOBT  Never done    There are no preventive care reminders to display for this patient.  Lab Results  Component Value Date   TSH 2.640 09/05/2021   Lab Results  Component Value Date   WBC 7.4 08/29/2022   HGB 13.9 08/29/2022   HCT 41.4 08/29/2022   MCV 95 08/29/2022   PLT 382 08/29/2022   Lab Results  Component Value Date   NA 140 08/29/2022   K 4.4 08/29/2022   CO2 21 08/29/2022   GLUCOSE 110 (H) 08/29/2022   BUN  10 08/29/2022    CREATININE 1.17 (H) 08/29/2022   BILITOT 0.3 08/29/2022   ALKPHOS 125 (H) 08/29/2022   AST 13 08/29/2022   ALT 11 08/29/2022   PROT 6.9 08/29/2022   ALBUMIN 4.6 08/29/2022   CALCIUM 9.4 08/29/2022   ANIONGAP 6 01/31/2022   EGFR 55 (L) 08/29/2022   Lab Results  Component Value Date   CHOL 210 (H) 08/29/2022   Lab Results  Component Value Date   HDL 65 08/29/2022   Lab Results  Component Value Date   LDLCALC 131 (H) 08/29/2022   Lab Results  Component Value Date   TRIG 78 08/29/2022   Lab Results  Component Value Date   CHOLHDL 3.2 08/29/2022   Lab Results  Component Value Date   HGBA1C 5.8 (H) 08/29/2022  CT Neck 01/19/21 EXAM: CT NECK WITH CONTRAST   TECHNIQUE: Multidetector CT imaging of the neck was performed using the standard protocol following the bolus administration of intravenous contrast.   CONTRAST:  160mL OMNIPAQUE IOHEXOL 350 MG/ML SOLN   COMPARISON:  08/19/2020   FINDINGS: Pharynx and larynx: No evidence of primary mass or inflammation along the mucosa   Salivary glands: No inflammation, mass, or stone.   Thyroid: Normal.   Lymph nodes: Bilateral jugular chain adenopathy. A right level 4 node measures 19 mm maximum on axial images. There is an infiltrative process at the left thoracic inlet and upper mediastinum, with sheet like soft tissue thickening posterior to the jugular notch and manubrium. Just anterior to the left IJ brachiocephalic confluence is a cystic appearing component measuring 12 mm. Soft tissue density extends anterior to the left first costochondral junction. There is reticulation and adjacent left upper lobe with partially covered layering left pleural effusion.   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   Limited intracranial: Negative   Visualized orbits: Negative   Mastoids and visualized paranasal sinuses: Clear   Skeleton: No erosion of ribs or joint spaces adjacent  to the inflammation, although the left first costochondral junction is wider than the right.   Upper chest: Reticular opacity in the subpleural left upper lobe adjacent to the chest wall process.   These results will be called to the ordering clinician or representative by the Radiologist Assistant, and communication documented in the PACS or [employer].   IMPRESSION: Infiltrative process centered on the left chest wall, especially at left first costochondral junction, with regional adenopathy , mediastinal fat infiltration, and apical pulmonary opacity. Atypical infection or malignancy are both possible.   MRI 05/2021 FINDINGS: Along the inferomedial margin of the left subclavius muscle for example on image 26 of series 24 and also shown on image 26 of series 18, we demonstrate a somewhat infiltrative 2.7 by 2.2 by 2.6 cm (volume = 8.1 cm^3) process. When I compare back to prior exams, the examinations of 01/18/2021 showed a substantially greater amount of abnormal enhancement in the retrosternal region, anterior mediastinum, and in the anterior left upper lobe adjacent to this lesion. By the time of the 02/03/2021 PET-CT, there was new bony destruction of the left anterior first rib but was not present on the earlier 01/27/2021 examinations. On today's exam there is substantial reduction in the overall previously enhancing soft tissue both in the subclavicular and subpectoral region and in the mediastinum, with anterior mediastinal and retrosternal involvement essentially resolved.   Moreover, the previous right level IV adenopathy in the lower neck shown on prior PET-CT has  resolved. Please note that today's examination was not a diagnostic CT of the neck but does include the region of the prior enlarged right level IV lymph nodes. Prior AP window lymph node previously 1.1 cm in short axis diameter, currently 0.4 cm in short axis on image 46 series 17.   There is  some accentuated enhancement medially in the left subclavian small cell compared to right for example on image 25 of series 24 which may be related to the underlying process.   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   IMPRESSION: 1. Substantial improvement in the abnormal infiltrative process in the left medial infraclavicular region, along the inferior margin of the medial left subclavius muscle. On previous exams this process demonstrated substantial mediastinal, substernal, and anterior left upper lobe involvement on the CT scans from July, and on the PET-CT had progressed to bony destructive findings in the anterior left first rib. On today's examination, the pulmonary, mediastinal, and substernal components have resolved, rib involvement is now questionable, and the effect of this process on the adjacent internal jugular and subclavian veins is substantially reduced. Moreover, the right level IV adenopathy and mediastinal adenopathy shown on the prior PET-CT has resolved. The remaining enhancing lesion measures up to 2.7 cm in long axis. The substantial improvement without known specific therapy would seem to suggest inflammatory or infectious process, but I do note that the patient did not have leukocytosis back in August. Possibility of an underlying mass or vascular malformation which previously bled causing inflammatory response without substantial leukocytosis is a possibility, although the aggressive findings previously present along the left anterior first rib with tended favor either tumor or infection rather than the sequela of a hematoma. Overall given the substantial improvement, surveillance might be a  reasonable option although open biopsy to definitively sample and/or remove the underlying lesion with likewise be a very reasonable choice. MRI chest 08/25/21 FINDINGS: Bones/Joint/Cartilage:   The previously demonstrated process anteriorly in the upper left chest wall between the left clavicular head and anterior aspect of the left 1st rib shows continued improvement. There is mild residual asymmetric soft tissue enhancement in this area which extends around the left sternoclavicular joint. No progressive bone destruction identified. No evidence of soft tissue mass.   Ligaments:   No ligamentous abnormalities identified.   Muscles and Tendons:   The overlying pectoralis musculature appears normal.   Soft tissue:   As above, mild residual soft tissue enhancement in the area of the previously demonstrated abnormality. No fluid collection or soft tissue mass identified. The left subclavian and left internal jugular veins appear patent.   IMPRESSION: 1. Further improvement in the previously demonstrated process involving the left anterior chest wall centered at the left 1st costochondral junction. The continued improvement favors a resolving inflammatory/infectious process. No mass lesion identified to suggest neoplasm. 2. This process was better demonstrated by CT, and if additional follow-is warranted clinically, consider neck CT with contrast.   CTs 01/2022 ED: CT Soft Tissue Neck W Contrast   Result Date: 01/31/2022 CLINICAL DATA:  57 year old female with history of a infiltrative process at the left anterior 1st rib, superior mediastinum 1 year ago in July 2022. Biopsied x 2 with pathology NOT revealing malignancy, and subsequent resolution  on CT and MRI suggesting an infectious or inflammatory process. Recurrent left neck and shoulder pain for 1.5 weeks. EXAM: CT NECK WITH CONTRAST TECHNIQUE: Multidetector CT imaging of the neck was performed using the standard protocol  following the bolus administration of intravenous contrast. RADIATION DOSE REDUCTION: This exam was performed according to the departmental dose-optimization program which includes automated exposure control, adjustment of the mA and/or kV according to patient size and/or use of iterative reconstruction technique. CONTRAST:  59mL OMNIPAQUE IOHEXOL 300 MG/ML  SOLN COMPARISON:  Neck CT 09/15/2021, 01/18/2021. Chest CT today reported separately. FINDINGS: Pharynx and larynx: Oropharynx motion artifact today. Pharyngeal and laryngeal soft tissue contours remain stable and within normal limits. Negative visible parapharyngeal and retropharyngeal spaces. Salivary glands: Negative. Motion artifact at the sublingual space which seems to remain negative. Thyroid: Negative. Lymph nodes: Left level 4 lymph nodes at the left thoracic inlet are small and within normal limits on series 4, image 75, stable since March. There is a larger 7-8 mm node at the junction of left level 3 and 4 on series 4, image 60, but also stable since March, smaller from last year, and within normal limits by size criteria. Other bilateral level 2 and level 3 nodes also are within normal limits and smaller since July of last year. No cystic or necrotic nodes. Vascular: Major vascular structures in the neck and at the skull base including both internal jugular veins are patent. Left greater than right cervical carotid atherosclerosis. Limited intracranial: Mild Calcified atherosclerosis at the skull base. Negative visible brain parenchyma. Visualized orbits: Negative. Mastoids and visualized paranasal sinuses: Visualized paranasal sinuses and mastoids are stable and well aerated. Skeleton: Motion artifact, especially the mandible. Chronic C5-C6 disc and endplate degeneration. No acute or suspicious osseous lesion identified in the neck. Upper chest: Chest CT today is reported separately. IMPRESSION: 1. No acute or inflammatory process identified in the  neck. Stable since March and satisfactory CT appearance of the Neck, with resolved lymphadenopathy and left thoracic inlet inflammation since 2022. 2. Chest CT today reported separately. Electronically Signed   By: Genevie Ann M.D.   On: 01/31/2022 10:04    CT Chest W Contrast   Result Date: 01/31/2022 CLINICAL DATA:  Left neck and shoulder pain for 1.5 weeks. History of tumor. EXAM: CT CHEST WITH CONTRAST TECHNIQUE: Multidetector CT imaging of the chest was performed during intravenous contrast administration. RADIATION DOSE REDUCTION: This exam was performed according to the departmental dose-optimization program which includes automated exposure control, adjustment of the mA and/or kV according to patient size and/or use of iterative reconstruction technique. CONTRAST:  28mL OMNIPAQUE IOHEXOL 300 MG/ML  SOLN COMPARISON:  CT chest 09/16/2018 FINDINGS: Cardiovascular: Heart size is normal. There is no pericardial effusion. Mild calcifications of the left anterior descending artery are noted. The remainder of the thoracic vasculature is unremarkable. Mediastinum/Nodes: The thyroid is unremarkable. The esophagus is grossly unremarkable. There is no mediastinal, hilar, or axillary lymphadenopathy. Lungs/Pleura: The trachea and central airways are patent. The lungs clear. There is no focal consolidation or pulmonary edema. There is no pleural effusion or pneumothorax. There is upper lobe predominant emphysema, stable. There is a 5 mm nodule in the posteromedial left lower lobe (4-102), unchanged since the study from 08/19/2020 and 01/19/2020. No specific imaging follow-up is required for this nodule. There are no new or enlarging nodules. Upper Abdomen: The imaged portions of the upper abdominal viscera are unremarkable. Musculoskeletal: There is no acute osseous abnormality or suspicious osseous lesion.  IMPRESSION: 1. No acute process in the chest or significant change since 09/16/2018. No lymphadenopathy identified.  2. Emphysema (ICD10-J43.9). Electronically Signed   By: Valetta Mole M.D.   On: 01/31/2022 10:01        Assessment & Plan:   Problem List Items Addressed This Visit   None No orders of the defined types were placed in this encounter.  Follow-up: No follow-ups on file.

## 2022-10-16 ENCOUNTER — Encounter: Payer: Self-pay | Admitting: Critical Care Medicine

## 2022-10-16 DIAGNOSIS — G894 Chronic pain syndrome: Secondary | ICD-10-CM

## 2022-10-17 ENCOUNTER — Other Ambulatory Visit: Payer: Self-pay | Admitting: Critical Care Medicine

## 2022-10-17 ENCOUNTER — Encounter: Payer: Self-pay | Admitting: Critical Care Medicine

## 2022-10-17 ENCOUNTER — Telehealth: Payer: Self-pay | Admitting: Critical Care Medicine

## 2022-10-17 MED ORDER — OXYCODONE-ACETAMINOPHEN 10-325 MG PO TABS: 1.0000 | ORAL_TABLET | Freq: Three times a day (TID) | ORAL | 0 refills | Status: AC | PRN

## 2022-10-17 NOTE — Telephone Encounter (Signed)
Copied from CRM [id number]. Topic: General - Other >> Oct 17, 2022  4:35 PM Dominique A wrote: Reason for CRM: Pt is calling back to speak with PCP. Per pt she is out of her medication oxyCODONE-acetaminophen (PERCOCET) 10-325 MG tablet and her surgery is not set for a couple of weeks. Her pain management dr has closed. Please advise.

## 2022-10-17 NOTE — Telephone Encounter (Signed)
Medication Refill - Medication: oxyCODONE-acetaminophen (PERCOCET) 10-325 MG tablet   Has the patient contacted their pharmacy? Yes.    Preferred Pharmacy (with phone number or street name):  [id number] - [place], [place] - [address] AT [facility] OF [address] Phone: [phone]  Fax: [phone]     Has the patient been seen for an appointment in the last year OR does the patient have an upcoming appointment? Yes.    Agent: Please be advised that RX refills may take up to 3 business days. We ask that you follow-up with your pharmacy.  Patient stated she is completely out of meds and has been for 5 days. Per her insurance the last script was only for a 5 day supply. Patient wants 120 a month supply instead of the 90 a month supply she has been prescribed previously. She is scheduled for shoulder surgery on 09/03/02. The pain management provider she was referred to has been shut down.

## 2022-10-17 NOTE — Telephone Encounter (Signed)
Needs ref to another pain clinic current one has closed

## 2022-10-17 NOTE — Telephone Encounter (Signed)
Called patient and she is aware

## 2022-10-17 NOTE — Telephone Encounter (Signed)
Requested medication (s) are due for refill today - yes  Requested medication (s) are on the active medication list - yes  Future visit scheduled -yes  Last refill: 07/19/22 #60   Notes to clinic: non delegated Rx  Requested Prescriptions  Pending Prescriptions Disp Refills   oxyCODONE-acetaminophen (PERCOCET) 10-325 MG tablet 60 tablet 0    Sig: Take 1 tablet by mouth every 8 (eight) hours as needed for pain.     Not Delegated - Analgesics:  Opioid Agonist Combinations Failed - 10/17/2022 11:54 AM      Failed - This refill cannot be delegated      Passed - Urine Drug Screen completed in last 360 days      Passed - Valid encounter within last 3 months    Recent Outpatient Visits           1 month ago Mixed hyperlipidemia   [place] [facility] Storm Frisk, MD   6 months ago Chronic pain syndrome   [facility] Storm Frisk, MD   8 months ago Chronic pain syndrome   [facility] Storm Frisk, MD   1 year ago Costochondritis   [place] [facility] Storm Frisk, MD   1 year ago Mass of left chest wall   [facility] Storm Frisk, MD       Future Appointments             In 3 months Storm Frisk, MD [place]               Requested Prescriptions  Pending Prescriptions Disp Refills   oxyCODONE-acetaminophen (PERCOCET) 10-325 MG tablet 60 tablet 0    Sig: Take 1 tablet by mouth every 8 (eight) hours as needed for pain.     Not Delegated - Analgesics:  Opioid Agonist Combinations Failed - 10/17/2022 11:54 AM      Failed - This refill cannot be delegated      Passed - Urine Drug Screen completed in last 360 days      Passed - Valid encounter within last 3 months    Recent Outpatient Visits           1 month ago Mixed hyperlipidemia   [facility] Storm Frisk, MD   6 months ago Chronic pain syndrome   [facility] Storm Frisk, MD   8 months ago Chronic pain syndrome   [facility] Storm Frisk, MD   1 year ago Costochondritis   [facility] Storm Frisk, MD   1 year ago Mass of left chest wall   [facility] Storm Frisk, MD       Future Appointments             In 3 months Delford Field Charlcie Cradle, MD [facility]

## 2022-10-17 NOTE — Telephone Encounter (Signed)
Pt has sent me multiple mychart msgs on this matter  I have referred her to another pain clinic  Her shoulder surgery is the 25th of April   I will give her only a 10day supply

## 2022-10-18 ENCOUNTER — Encounter: Payer: Self-pay | Admitting: Critical Care Medicine

## 2022-10-19 ENCOUNTER — Other Ambulatory Visit (HOSPITAL_COMMUNITY): Payer: Self-pay

## 2022-10-23 ENCOUNTER — Other Ambulatory Visit (HOSPITAL_COMMUNITY): Payer: Self-pay

## 2022-10-25 ENCOUNTER — Inpatient Hospital Stay: Admission: RE | Admit: 2022-10-25 | Payer: Medicaid Other | Source: Ambulatory Visit

## 2022-10-25 ENCOUNTER — Telehealth: Payer: Self-pay | Admitting: Orthopedic Surgery

## 2022-10-25 NOTE — Telephone Encounter (Signed)
Patient is cancelling left shoulder arthroscopy scheduled for 11-02-22.  She states she has too much going on right now.  Patient will call to reschedule in a couple of months.

## 2022-10-25 NOTE — Progress Notes (Signed)
Surgical Instructions    Your procedure is scheduled on Thursday April 25th.  Report to [facility] Main Entrance "A" at 9:50 A.M., then check in with the Admitting office.  Call this number if you have problems the morning of surgery:  [phone]   If you have any questions prior to your surgery date call [phone]: Open Monday-Friday 8am-4pm If you experience any cold or flu symptoms such as cough, fever, chills, shortness of breath, etc. between now and your scheduled surgery, please notify us at the above number     Remember:  Do not eat after midnight the night before your surgery  You may drink clear liquids until 8:50am the morning of your surgery.   Clear liquids allowed are: Water, Non-Citrus Juices (without pulp), Carbonated Beverages, Clear Tea, Black Coffee ONLY (NO MILK, CREAM OR POWDERED CREAMER of any kind), and Gatorade    Take these medicines the morning of surgery with A SIP OF WATER: lamoTRIgine (LAMICTAL) 100 MG tablet  omeprazole (PRILOSEC) 20 MG capsule    IF NEEDED  oxyCODONE-acetaminophen (PERCOCET) 10-325 MG tablet     As of today, STOP taking any Aspirin (unless otherwise instructed by your surgeon) Aleve, Naproxen, Ibuprofen, Motrin, Advil, Goody's, BC's, all herbal medications, fish oil, and all vitamins.           Do not wear jewelry or makeup. Do not wear lotions, powders, perfumes or deodorant. Do not shave 48 hours prior to surgery.   Do not bring valuables to the hospital. Do not wear nail polish, gel polish, artificial nails, or any other type of covering on natural nails (fingers and toes) If you have artificial nails or gel coating that need to be removed by a nail salon, please have this removed prior to surgery. Artificial nails or gel coating may interfere with anesthesia's ability to adequately monitor your vital signs.  [facility] is not responsible for any belongings or valuables.    Do NOT Smoke (Tobacco/Vaping)  24 hours prior  to your procedure  If you use a CPAP at night, you may bring your mask for your overnight stay.   Contacts, glasses, hearing aids, dentures or partials may not be worn into surgery, please bring cases for these belongings   For patients admitted to the hospital, discharge time will be determined by your treatment team.   Patients discharged the day of surgery will not be allowed to drive home, and someone needs to stay with them for 24 hours.   SURGICAL WAITING ROOM VISITATION Patients having surgery or a procedure may have no more than 2 support people in the waiting area - these visitors may rotate.   Children under the age of 78 must have an adult with them who is not the patient. If the patient needs to stay at the hospital during part of their recovery, the visitor guidelines for inpatient rooms apply. Pre-op nurse will coordinate an appropriate time for 1 support person to accompany patient in pre-op.  This support person may not rotate.   Please refer to [email or web address] for the visitor guidelines for Inpatients (after your surgery is over and you are in a regular room).    Special instructions:    Oral Hygiene is also important to reduce your risk of infection.  Remember - BRUSH YOUR TEETH THE MORNING OF SURGERY WITH YOUR REGULAR TOOTHPASTE   [place]- Preparing For Surgery  Before surgery, you can play an important role. Because skin is not sterile, your skin needs  to be as free of germs as possible. You can reduce the number of germs on your skin by washing with CHG (chlorahexidine gluconate) Soap before surgery.  CHG is an antiseptic cleaner which kills germs and bonds with the skin to continue killing germs even after washing.     Please do not use if you have an allergy to CHG or antibacterial soaps. If your skin becomes reddened/irritated stop using the CHG.  Do not shave (including legs and underarms) for at least  48 hours prior to first CHG shower. It is OK to shave your face.  Please follow these instructions carefully.     Shower the NIGHT BEFORE SURGERY and the MORNING OF SURGERY with CHG Soap.   If you chose to wash your hair, wash your hair first as usual with your normal shampoo. After you shampoo, rinse your hair and body thoroughly to remove the shampoo.  Then [employer] and genitals (private parts) with your normal soap and rinse thoroughly to remove soap.  After that Use CHG Soap as you would any other liquid soap. You can apply CHG directly to the skin and wash gently with a scrungie or a clean washcloth.   Apply the CHG Soap to your body ONLY FROM THE NECK DOWN.  Do not use on open wounds or open sores. Avoid contact with your eyes, ears, mouth and genitals (private parts). Wash Face and genitals (private parts)  with your normal soap.   Wash thoroughly, paying special attention to the area where your surgery will be performed.  Thoroughly rinse your body with warm water from the neck down.  DO NOT shower/wash with your normal soap after using and rinsing off the CHG Soap.  Pat yourself dry with a CLEAN TOWEL.  Wear CLEAN PAJAMAS to bed the night before surgery  Place CLEAN SHEETS on your bed the night before your surgery  DO NOT SLEEP WITH PETS.   Day of Surgery:  Take a shower with CHG soap. Wear Clean/Comfortable clothing the morning of surgery Do not apply any deodorants/lotions.   Remember to brush your teeth WITH YOUR REGULAR TOOTHPASTE.    If you received a COVID test during your pre-op visit, it is requested that you wear a mask when out in public, stay away from anyone that may not be feeling well, and notify your surgeon if you develop symptoms. If you have been in contact with anyone that has tested positive in the last 10 days, please notify your surgeon.    Please read over the following fact sheets that you were given.

## 2022-10-26 ENCOUNTER — Inpatient Hospital Stay (HOSPITAL_COMMUNITY)
Admission: RE | Admit: 2022-10-26 | Discharge: 2022-10-26 | Disposition: A | Discharge status: Home / Self Care | Payer: Medicaid Other | Source: Ambulatory Visit

## 2022-10-26 ENCOUNTER — Encounter: Payer: Self-pay | Admitting: Critical Care Medicine

## 2022-10-26 DIAGNOSIS — M542 Cervicalgia: Secondary | ICD-10-CM | POA: Diagnosis not present

## 2022-10-26 DIAGNOSIS — M25512 Pain in left shoulder: Secondary | ICD-10-CM | POA: Diagnosis not present

## 2022-10-26 DIAGNOSIS — M12812 Other specific arthropathies, not elsewhere classified, left shoulder: Secondary | ICD-10-CM | POA: Diagnosis not present

## 2022-10-26 DIAGNOSIS — G8929 Other chronic pain: Secondary | ICD-10-CM | POA: Diagnosis not present

## 2022-10-26 DIAGNOSIS — M7502 Adhesive capsulitis of left shoulder: Secondary | ICD-10-CM | POA: Diagnosis not present

## 2022-10-26 DIAGNOSIS — M75102 Unspecified rotator cuff tear or rupture of left shoulder, not specified as traumatic: Secondary | ICD-10-CM | POA: Diagnosis not present

## 2022-10-26 NOTE — Telephone Encounter (Signed)
Ok thx.

## 2022-10-27 ENCOUNTER — Other Ambulatory Visit: Payer: Self-pay | Admitting: Critical Care Medicine

## 2022-10-27 ENCOUNTER — Encounter: Payer: Self-pay | Admitting: Critical Care Medicine

## 2022-10-27 ENCOUNTER — Ambulatory Visit: Payer: Self-pay | Admitting: *Deleted

## 2022-10-27 NOTE — Telephone Encounter (Signed)
Requested medication (s) are due for refill today:   Provider to review  Requested medication (s) are on the active medication list:   Yes  Future visit scheduled:   Yes   Last ordered: 10/17/2022-10/27/2022  #40, 0 refills  Non delegated refill reason returned   Requested Prescriptions  Pending Prescriptions Disp Refills   oxyCODONE-acetaminophen (PERCOCET) 10-325 MG tablet 40 tablet 0    Sig: Take 1 tablet by mouth every 8 (eight) hours as needed for up to 10 days for pain.     Not Delegated - Analgesics:  Opioid Agonist Combinations Failed - 10/27/2022  1:55 PM      Failed - This refill cannot be delegated      Passed - Urine Drug Screen completed in last 360 days      Passed - Valid encounter within last 3 months    Recent Outpatient Visits           1 month ago Mixed hyperlipidemia   [place] [facility] Storm Frisk, MD   6 months ago Chronic pain syndrome   [facility] Storm Frisk, MD   8 months ago Chronic pain syndrome   [facility] Storm Frisk, MD   1 year ago Costochondritis   [facility] Storm Frisk, MD   1 year ago Mass of left chest wall   [facility] Storm Frisk, MD       Future Appointments             In 2 months Storm Frisk, MD [facility]

## 2022-10-27 NOTE — Telephone Encounter (Signed)
Medication Refill - Medication: oxyCODONE-acetaminophen (PERCOCET) 10-325 MG tablet   Pt stated the last Rx Dr.Wright gave her for medication oxyCODONE-acetaminophen (PERCOCET) 10-325 MG tablet for 10 days the pharmacy only gave her 5 days worth and advised her that a new RX is needed to be sent in.  Has the patient contacted their pharmacy? Yes.    (Agent: If yes, when and what did the pharmacy advise?)  Preferred Pharmacy (with phone number or street name):  [id number] - [place], [place] - [address] AT [facility] OF [address]  [address] [place] [facility] [address]  Phone: [phone] Fax: [phone]  Hours: Open 24 hours   Has the patient been seen for an appointment in the last year OR does the patient have an upcoming appointment? Yes.    Agent: Please be advised that RX refills may take up to 3 business days. We ask that you follow-up with your pharmacy.

## 2022-10-27 NOTE — Telephone Encounter (Signed)
She has 20 tablets left at the pharmacy out of the 40 initially prescribed. They are not sure why she called requesting a refill. She needs to contact the pharmacy regarding pick up.

## 2022-10-27 NOTE — Telephone Encounter (Signed)
Spoke with patient . Verified name & DOB  patient is requesting that a new prescription be sent to her pharmacy for percocet. Advised patient that PCP just sent  percocet 40 count on 10/17/2022. Patient voiced that her pharmacy would only give her half the prescription ( 20 pills) because that was  the number allowed by her insurance. Now the pharmacy is requiring a new script.   Routing call to covering PCP. Please advise

## 2022-10-27 NOTE — Telephone Encounter (Signed)
  Chief Complaint: medication refill Percocet, reports feeling some withdrawals Symptoms: last percocet taken yesterday. Started feeling nausea today , anxious, sweating Frequency: today  Pertinent Negatives: Patient denies chest pain no difficulty breathing no shaking Disposition: ED /[] Urgent Care (no appt availability in office) / Appointment(In office/virtual)/  [place] [facility]/ Home Care/ Refused Recommended Disposition /[]  Mobile Bus/  Follow-up with PCP Additional Notes:   Recommended if sx worsen go to ED if needed. Patient requesting refill oxycodone reports she has been taking  1 tablet every 6 hours and discussed with PCP before. Reports pharmacy only dispensed 20 tablets not 40 . Please advise. Refill requested again. Future visit 01/23/23.      Reason for Disposition  Opioid use or misuse, Frequently Asked Questions (FAQs)  Answer Assessment - Initial Assessment Questions 1. DRUG: "What drug(s) are you using?"      Oxycodone (percocet) 2. ROUTE: "How are you using this drug?" (e.g., swallow, smoke, inject, huff, snort)     Swallow  3. HOW OFTEN: "How many days per week do you typically use it?"     Every 8 hours as needed 4. HOW MUCH: "How much do you use?"      Taking every 6 hours  5. LAST 24 HOURS: "Have you used it in the last 24 hours?"     Yesterday  6. SYMPTOMS: "What symptoms are you currently experiencing?" (e.g., none, headache, chest pain, abdomen pain, vomiting)     Nausea, anxious, sweating  7. DETOX PROGRAM: "Have you ever gone through a substance use treatment program (detox program)?"     Na  8. THERAPIST: "Do you have a counselor or therapist? Name?"     na 9. SUPPORT: "Who is with you now?" "Who do you live with?" "Do you have family or friends who you can talk to?"      na 10. PREGNANCY: "Is there any chance you are pregnant?" "When was your last menstrual period?"       na  Protocols used: Opioid Use and  Problems-A-AH

## 2022-10-27 NOTE — Telephone Encounter (Signed)
Spoke with patient . Verified name & DOB   advised patient the provider that cover for here PCP  spoke with her pharmacy and she has 20 pills left on the current prescription. Patient voices that when she called she was told that she needed a new script. Voiced to patient why she was told this because the is different then what our provider was told. Patient voiced that she would call the pharmacy back and see if they have the reminder of your prescription ready.

## 2022-10-30 ENCOUNTER — Other Ambulatory Visit (HOSPITAL_COMMUNITY): Payer: Self-pay

## 2022-10-30 ENCOUNTER — Telehealth: Payer: Self-pay

## 2022-10-30 DIAGNOSIS — M1990 Unspecified osteoarthritis, unspecified site: Secondary | ICD-10-CM | POA: Diagnosis not present

## 2022-10-30 DIAGNOSIS — M75102 Unspecified rotator cuff tear or rupture of left shoulder, not specified as traumatic: Secondary | ICD-10-CM | POA: Diagnosis not present

## 2022-10-30 DIAGNOSIS — M75112 Incomplete rotator cuff tear or rupture of left shoulder, not specified as traumatic: Secondary | ICD-10-CM | POA: Diagnosis not present

## 2022-10-30 DIAGNOSIS — G894 Chronic pain syndrome: Secondary | ICD-10-CM | POA: Diagnosis not present

## 2022-10-30 MED ORDER — OXYCODONE-ACETAMINOPHEN 10-325 MG PO TABS
1.0000 | ORAL_TABLET | Freq: Four times a day (QID) | ORAL | 0 refills | Status: DC | PRN
  Filled 2022-10-30 – 2022-10-31 (×2): qty 120, 30d supply, fill #0

## 2022-10-30 NOTE — Telephone Encounter (Signed)
Copied from CRM [id number]. Topic: General - Inquiry >> Oct 30, 2022  3:10 PM Katie Sherman wrote: Reason for CRM: pt wants to know why her insurance is only allowing her to get 5 pills at a time for her medication.  She didn't know the name and wanted a nurse to call her back.  [phone]

## 2022-10-31 ENCOUNTER — Other Ambulatory Visit (HOSPITAL_COMMUNITY): Payer: Self-pay

## 2022-10-31 NOTE — Telephone Encounter (Signed)
Called patient back and left voicemail  The fax needs to be sent to the provider that ordered her pain medication

## 2022-10-31 NOTE — Telephone Encounter (Signed)
I stated that she needs to speak with the insurance company about sending a fax over

## 2022-10-31 NOTE — Telephone Encounter (Signed)
Noted  

## 2022-10-31 NOTE — Telephone Encounter (Signed)
Pt is calling in returning a call from [place]. Pt wants to know if the office can call the insurance company and do a verbal PA instead of having them fax paperwork over for it to be completed. Please advise.

## 2022-10-31 NOTE — Telephone Encounter (Signed)
Called patient and she stated that she has spoken with insurance company and they are needing a prior authorization

## 2022-10-31 NOTE — Telephone Encounter (Addendum)
Called patient back and told her that she need to contact the doctor at the pain clinic and they need to start the process for the prior authorization  spoke with pharmacist verified that ordering Physician need to obtain PA  patient verbalized understanding. Routing to Systems analyst   Patient goes to [facility]

## 2022-11-02 ENCOUNTER — Encounter (HOSPITAL_COMMUNITY): Admission: RE | Payer: Self-pay | Source: Home / Self Care

## 2022-11-02 ENCOUNTER — Ambulatory Visit (HOSPITAL_COMMUNITY): Admission: RE | Admit: 2022-11-02 | Payer: Medicaid Other | Source: Home / Self Care | Admitting: Orthopedic Surgery

## 2022-11-02 SURGERY — SHOULDER ARTHROSCOPY WITH ROTATOR CUFF REPAIR AND SUBACROMIAL DECOMPRESSION
Anesthesia: General | Laterality: Left

## 2022-11-03 ENCOUNTER — Ambulatory Visit: Payer: Medicaid Other

## 2022-11-10 ENCOUNTER — Encounter: Payer: Medicaid Other | Admitting: Surgical

## 2022-11-16 ENCOUNTER — Ambulatory Visit: Payer: Medicaid Other

## 2022-11-21 ENCOUNTER — Other Ambulatory Visit (HOSPITAL_COMMUNITY): Payer: Self-pay

## 2022-11-24 IMAGING — CT NM PET TUM IMG INITIAL (PI) SKULL BASE T - THIGH
1 of 10 series · 1 of 25 positions shown · non-contrast
Comparison: Neck and chest CT 01/18/2021

CLINICAL DATA: Initial treatment strategy for mediastinal mass.

EXAM:
NUCLEAR MEDICINE PET SKULL BASE TO THIGH
TECHNIQUE: 8.41 mCi F-18 FDG was injected intravenously. Full-ring PET imaging
was performed from the skull base to thigh after the radiotracer. CT
data was obtained and used for attenuation correction and anatomic
localization.
Fasting blood glucose: 111 mg/dl

[Series 3: ct wb 5.0 b30f · axial · 5.0mm · 0.98mm/px · 1 of 290 slices shown]
[im 290/290  brain]
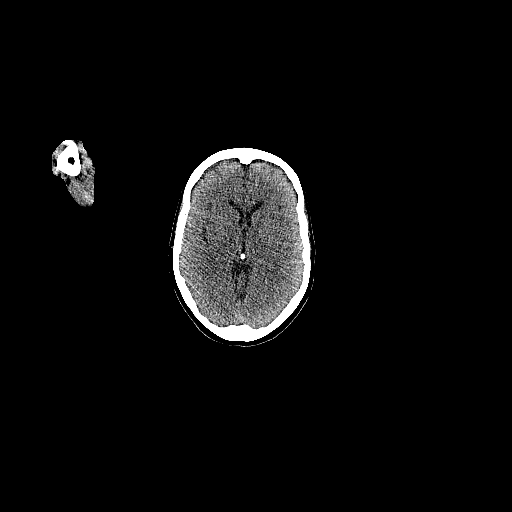

[1 of 25 positions shown; findings below may reference images not displayed]

FINDINGS: Mediastinal blood pool activity: SUV max

Liver activity: SUV max

NECK: Soft tissue thickening and hypermetabolism noted at the base
of the. SUV max is 7.72. This could be prominent lymphoid tissue at
the base of the tongue or a tongue base mass. Recommend correlation
with direct visualization. There are mildly hypermetabolic right
level 2 lymph nodes with SUV max of 4.18. There is also a enlarged
right level 4 lymph node measuring approximately 13 mm. SUV max is
8.84.

Incidental CT findings: none

CHEST: Extensive subclavicular soft tissue density as demonstrated
on the prior CT scans mainly between the head of the clavicle and
the first left rib. SUV max is 13 point thick thick in there appears
to be partial destruction of the anterior first left rib. No obvious
involvement of the clavicle. Adjacent anterior mediastinal soft
tissue density with SUV max of 9.69. There is also tumor in the left
chest between the left first and second ribs invading the left
pectoralis muscle. SUV max is 1[REDACTED] mm prevascular node has an SUV max of 392.

No obvious pulmonary lesions or pulmonary nodules.

Mild diffuse uptake involving the distal esophagus without mass.
This is likely due to esophagitis.

Incidental CT findings: Emphysematous changes and small hiatal
hernia.

ABDOMEN/PELVIS: No abnormal hypermetabolic activity within the
liver, pancreas, adrenal glands, or spleen. No hypermetabolic lymph
nodes in the abdomen or pelvis.

Incidental CT findings: none

SKELETON: No findings for osseous tumor.

Incidental CT findings: none
IMPRESSION: 1. Ill-defined hypermetabolic infiltrating soft tissue mass
involving the left chest wall, mediastinum and right-sided neck
nodes. Findings could be due to an atypical/aggressive lymphoma or
sarcoma.
2. Soft tissue thickening and hypermetabolism at the base of the
tongue. This appears fairly symmetric and may just be prominent
lymphoid tissue but direct visualization is suggested.
3. No significant findings involving the lungs, abdomen/pelvis or
bony structures (other than the first left anterior rib).

## 2022-11-28 ENCOUNTER — Other Ambulatory Visit (HOSPITAL_COMMUNITY): Payer: Self-pay

## 2022-11-28 DIAGNOSIS — M75112 Incomplete rotator cuff tear or rupture of left shoulder, not specified as traumatic: Secondary | ICD-10-CM | POA: Diagnosis not present

## 2022-11-28 DIAGNOSIS — M75102 Unspecified rotator cuff tear or rupture of left shoulder, not specified as traumatic: Secondary | ICD-10-CM | POA: Diagnosis not present

## 2022-11-28 DIAGNOSIS — G894 Chronic pain syndrome: Secondary | ICD-10-CM | POA: Diagnosis not present

## 2022-11-28 DIAGNOSIS — M1990 Unspecified osteoarthritis, unspecified site: Secondary | ICD-10-CM | POA: Diagnosis not present

## 2022-11-28 MED ORDER — OXYCODONE-ACETAMINOPHEN 10-325 MG PO TABS
1.0000 | ORAL_TABLET | Freq: Four times a day (QID) | ORAL | 0 refills | Status: DC | PRN
  Filled 2022-11-28: qty 120, 30d supply, fill #0

## 2022-12-01 ENCOUNTER — Other Ambulatory Visit: Payer: Self-pay

## 2022-12-05 ENCOUNTER — Other Ambulatory Visit (HOSPITAL_COMMUNITY): Payer: Self-pay

## 2022-12-25 ENCOUNTER — Encounter (HOSPITAL_COMMUNITY): Payer: Self-pay | Admitting: Psychiatry

## 2022-12-25 ENCOUNTER — Telehealth (HOSPITAL_BASED_OUTPATIENT_CLINIC_OR_DEPARTMENT_OTHER): Payer: Medicaid Other | Admitting: Psychiatry

## 2022-12-25 VITALS — Wt 174.0 lb

## 2022-12-25 DIAGNOSIS — F4310 Post-traumatic stress disorder, unspecified: Secondary | ICD-10-CM

## 2022-12-25 DIAGNOSIS — F319 Bipolar disorder, unspecified: Secondary | ICD-10-CM

## 2022-12-25 MED ORDER — TRAZODONE HCL 50 MG PO TABS
50.0000 mg | ORAL_TABLET | Freq: Every evening | ORAL | 2 refills | Status: DC | PRN
Start: 2022-12-25 — End: 2023-06-25

## 2022-12-25 MED ORDER — LAMOTRIGINE 100 MG PO TABS
300.0000 mg | ORAL_TABLET | Freq: Every day | ORAL | 2 refills | Status: DC
Start: 2022-12-25 — End: 2023-03-26
  Filled 2023-01-16: qty 90, 30d supply, fill #0
  Filled 2023-02-20: qty 90, 30d supply, fill #1

## 2022-12-25 MED ORDER — QUETIAPINE FUMARATE 200 MG PO TABS
200.0000 mg | ORAL_TABLET | Freq: Every day | ORAL | 2 refills | Status: DC
Start: 2022-12-25 — End: 2023-03-26
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
## 2022-12-26 ENCOUNTER — Other Ambulatory Visit (HOSPITAL_COMMUNITY): Payer: Self-pay

## 2022-12-26 DIAGNOSIS — M1990 Unspecified osteoarthritis, unspecified site: Secondary | ICD-10-CM | POA: Diagnosis not present

## 2022-12-26 DIAGNOSIS — M75112 Incomplete rotator cuff tear or rupture of left shoulder, not specified as traumatic: Secondary | ICD-10-CM | POA: Diagnosis not present

## 2022-12-26 DIAGNOSIS — G894 Chronic pain syndrome: Secondary | ICD-10-CM | POA: Diagnosis not present

## 2022-12-26 MED ORDER — OXYCODONE-ACETAMINOPHEN 10-325 MG PO TABS
1.0000 | ORAL_TABLET | Freq: Four times a day (QID) | ORAL | 0 refills | Status: DC | PRN
  Filled 2022-12-26: qty 120, 30d supply, fill #0

## 2023-01-01 NOTE — Telephone Encounter (Signed)
Can you have her come in for return office visit just so we can examine that shoulder again since she did something to it.  Thanks we will get her scheduled soon after that.

## 2023-01-13 ENCOUNTER — Other Ambulatory Visit (HOSPITAL_COMMUNITY): Payer: Self-pay

## 2023-01-13 ENCOUNTER — Other Ambulatory Visit: Payer: Self-pay | Admitting: Critical Care Medicine

## 2023-01-15 ENCOUNTER — Other Ambulatory Visit: Payer: Self-pay | Admitting: Critical Care Medicine

## 2023-01-15 ENCOUNTER — Other Ambulatory Visit (HOSPITAL_COMMUNITY): Payer: Self-pay

## 2023-01-15 MED ORDER — VALACYCLOVIR HCL 500 MG PO TABS
500.0000 mg | ORAL_TABLET | Freq: Every day | ORAL | 1 refills | Status: DC
  Filled 2023-01-15 – 2023-01-29 (×2): qty 14, 14d supply, fill #0
  Filled 2023-02-14: qty 14, 14d supply, fill #1
  Filled 2023-03-06: qty 14, 14d supply, fill #2
  Filled 2023-04-02: qty 14, 14d supply, fill #3
  Filled 2023-04-17: qty 14, 14d supply, fill #4
  Filled 2023-05-08: qty 14, 14d supply, fill #5
  Filled 2023-05-22: qty 14, 14d supply, fill #6

## 2023-01-15 MED ORDER — PRAVASTATIN SODIUM 40 MG PO TABS
40.0000 mg | ORAL_TABLET | Freq: Every day | ORAL | 0 refills | Status: DC
  Filled 2023-01-15 – 2023-01-29 (×2): qty 30, 30d supply, fill #0

## 2023-01-15 NOTE — Telephone Encounter (Signed)
Requested medication (s) are due for refill today:   Not sure  On 08/29/2022 reported as pt not taking.  Requested medication (s) are on the active medication list:   Yes  Future visit scheduled:   Yes 01/23/2023 with Dr. Delford Field   Last ordered: 07/21/2022 #30, 3 refills via the [facility].  Returned because not sure she is still taking this.   Has upcoming appt.   Requested Prescriptions  Pending Prescriptions Disp Refills   pravastatin (PRAVACHOL) 40 MG tablet 30 tablet 3    Sig: Take 1 tablet (40 mg total) by mouth daily.     Cardiovascular:  Antilipid - Statins Failed - 01/13/2023  1:04 AM      Failed - Lipid Panel in normal range within the last 12 months    Cholesterol, Total  Date Value Ref Range Status  08/29/2022 210 (H) 100 - 199 mg/dL Final   LDL Chol Calc (NIH)  Date Value Ref Range Status  08/29/2022 131 (H) 0 - 99 mg/dL Final   HDL  Date Value Ref Range Status  08/29/2022 65 >39 mg/dL Final   Triglycerides  Date Value Ref Range Status  08/29/2022 78 0 - 149 mg/dL Final         Passed - Patient is not pregnant      Passed - Valid encounter within last 12 months    Recent Outpatient Visits           4 months ago Mixed hyperlipidemia   [place] [facility] Storm Frisk, MD   9 months ago Chronic pain syndrome   [facility] Storm Frisk, MD   11 months ago Chronic pain syndrome   [facility] Storm Frisk, MD   1 year ago Costochondritis   [place] [facility] Storm Frisk, MD   1 year ago Mass of left chest wall   [facility] Storm Frisk, MD       Future Appointments             In 1 week Storm Frisk, MD [facility]

## 2023-01-16 ENCOUNTER — Other Ambulatory Visit (HOSPITAL_COMMUNITY): Payer: Self-pay

## 2023-01-16 ENCOUNTER — Other Ambulatory Visit: Payer: Self-pay

## 2023-01-19 ENCOUNTER — Other Ambulatory Visit (HOSPITAL_COMMUNITY): Payer: Self-pay

## 2023-01-21 NOTE — Progress Notes (Signed)
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   IMPRESSION: 1. Substantial improvement in the abnormal infiltrative process in the left medial infraclavicular region, along the inferior margin of the medial left subclavius muscle. On previous exams this process demonstrated substantial mediastinal, substernal, and anterior left upper lobe involvement on the CT scans from July, and on the PET-CT had progressed to bony destructive findings in the anterior left first rib. On today's examination, the pulmonary, mediastinal, and substernal components have resolved, rib involvement is now questionable, and the effect of this process on the adjacent internal jugular and subclavian veins is substantially reduced. Moreover, the right level IV adenopathy and mediastinal  adenopathy shown on the prior PET-CT has resolved. The remaining enhancing lesion measures up to 2.7 cm in long axis. The substantial improvement without known specific therapy would seem to suggest inflammatory or infectious process, but I do note that the patient did not have leukocytosis back in August. Possibility of an underlying mass or vascular malformation which previously bled causing inflammatory response without substantial leukocytosis is a possibility, although the aggressive findings previously present along the left anterior first rib with tended favor either tumor or infection rather than the sequela of a hematoma. Overall given the substantial improvement, surveillance might be a reasonable option although open biopsy to definitively sample and/or remove the underlying lesion with likewise be a very reasonable choice. MRI chest 08/25/21 FINDINGS: Bones/Joint/Cartilage:   The previously demonstrated process anteriorly in the upper left chest wall between the left clavicular head and anterior aspect of the left 1st rib shows continued improvement. There is mild residual asymmetric soft tissue enhancement in this area which extends around the left sternoclavicular joint. No progressive bone destruction identified. No evidence of soft tissue mass.   Ligaments:   No ligamentous abnormalities identified.   Muscles  and Tendons:   The overlying pectoralis musculature appears normal.   Soft tissue:   As above, mild residual soft tissue enhancement in the area of the previously demonstrated abnormality. No fluid collection or soft tissue mass identified. The left subclavian and left internal jugular veins appear patent.   IMPRESSION: 1. Further improvement in the previously demonstrated process involving the left anterior chest wall centered at the left 1st costochondral junction. The continued improvement favors a resolving inflammatory/infectious process. No mass  lesion identified to suggest neoplasm. 2. This process was better demonstrated by CT, and if additional follow-is warranted clinically, consider neck CT with contrast.   CTs 01/2022 ED: CT Soft Tissue Neck W Contrast   Result Date: 01/31/2022 CLINICAL DATA:  57 year old female with history of a infiltrative process at the left anterior 1st rib, superior mediastinum 1 year ago in July 2022. Biopsied x 2 with pathology NOT revealing malignancy, and subsequent resolution on CT and MRI suggesting an infectious or inflammatory process. Recurrent left neck and shoulder pain for 1.5 weeks. EXAM: CT NECK WITH CONTRAST TECHNIQUE: Multidetector CT imaging of the neck was performed using the standard protocol following the bolus administration of intravenous contrast. RADIATION DOSE REDUCTION: This exam was performed according to the departmental dose-optimization program which includes automated exposure control, adjustment of the mA and/or kV according to patient size and/or use of iterative reconstruction technique. CONTRAST:  75mL OMNIPAQUE IOHEXOL 300 MG/ML  SOLN COMPARISON:  Neck CT 09/15/2021, 01/18/2021. Chest CT today reported separately. FINDINGS: Pharynx and larynx: Oropharynx motion artifact today. Pharyngeal and laryngeal soft tissue contours remain stable and within normal limits. Negative visible parapharyngeal and retropharyngeal spaces. Salivary glands: Negative. Motion artifact at the sublingual space which seems to remain negative. Thyroid: Negative. Lymph nodes: Left level 4 lymph nodes at the left thoracic inlet are small and within normal limits on series 4, image 75, stable since March. There is a larger 7-8 mm node at the junction of left level 3 and 4 on series 4, image 60, but also stable since March, smaller from last year, and within normal limits by size criteria. Other bilateral level 2 and level 3 nodes also are within normal limits and smaller since July of last year. No cystic or  necrotic nodes. Vascular: Major vascular structures in the neck and at the skull base including both internal jugular veins are patent. Left greater than right cervical carotid atherosclerosis. Limited intracranial: Mild Calcified atherosclerosis at the skull base. Negative visible brain parenchyma. Visualized orbits: Negative. Mastoids and visualized paranasal sinuses: Visualized paranasal sinuses and mastoids are stable and well aerated. Skeleton: Motion artifact, especially the mandible. Chronic C5-C6 disc and endplate degeneration. No acute or suspicious osseous lesion identified in the neck. Upper chest: Chest CT today is reported separately. IMPRESSION: 1. No acute or inflammatory process identified in the neck. Stable since March and satisfactory CT appearance of the Neck, with resolved lymphadenopathy and left thoracic inlet inflammation since 2022. 2. Chest CT today reported separately. Electronically Signed   By: Odessa Fleming M.D.   On: 01/31/2022 10:04    CT Chest W Contrast   Result Date: 01/31/2022 CLINICAL DATA:  Left neck and shoulder pain for 1.5 weeks. History of tumor. EXAM: CT CHEST WITH CONTRAST TECHNIQUE: Multidetector CT imaging of the chest was performed during intravenous contrast administration. RADIATION DOSE REDUCTION: This exam was performed according to the departmental dose-optimization program which includes automated exposure control, adjustment of the mA and/or kV according to patient size and/or  use of iterative reconstruction technique. CONTRAST:  75mL OMNIPAQUE IOHEXOL 300 MG/ML  SOLN COMPARISON:  CT chest 09/16/2018 FINDINGS: Cardiovascular: Heart size is normal. There is no pericardial effusion. Mild calcifications of the left anterior descending artery are noted. The remainder of the thoracic vasculature is unremarkable. Mediastinum/Nodes: The thyroid is unremarkable. The esophagus is grossly unremarkable. There is no mediastinal, hilar, or axillary lymphadenopathy.  Lungs/Pleura: The trachea and central airways are patent. The lungs clear. There is no focal consolidation or pulmonary edema. There is no pleural effusion or pneumothorax. There is upper lobe predominant emphysema, stable. There is a 5 mm nodule in the posteromedial left lower lobe (4-102), unchanged since the study from 08/19/2020 and 01/19/2020. No specific imaging follow-up is required for this nodule. There are no new or enlarging nodules. Upper Abdomen: The imaged portions of the upper abdominal viscera are unremarkable. Musculoskeletal: There is no acute osseous abnormality or suspicious osseous lesion. IMPRESSION: 1. No acute process in the chest or significant change since 09/16/2018. No lymphadenopathy identified. 2. Emphysema (ICD10-J43.9). Electronically Signed   By: Lesia Hausen M.D.   On: 01/31/2022 10:01        Assessment & Plan:   Problem List Items Addressed This Visit       Respiratory   Lung nodules    Will monitor        Musculoskeletal and Integument   Adhesive capsulitis of left shoulder    To undergo surgery per orthopedics        Other   Bipolar 1 disorder, depressed (HCC)    Manage per psychiatry      Tobacco dependence    Patient does not engage around smoking cessation    Current smoking consumption amount: 1 pack a day  Dicsussion on advise to quit smoking and smoking impacts: Lung and cardiovascular  Patient's willingness to quit: Not wishing to quit  Methods to quit smoking discussed: Not engaged  Medication management of smoking session drugs discussed: Not engaged    Setting quit date not established  Follow-up arranged 5 months   Time spent counseling the patient: 5 minutes       Homelessness    Still on house living in her car      Chronic pain syndrome    Management per pain clinic      Other Visit Diagnoses     Ingrown toenail    -  Primary   Relevant Orders   Ambulatory referral to Podiatry   Frequent falls        Relevant Orders   Ambulatory referral to Physical Therapy       No orders of the defined types were placed in this encounter.  Follow-up: Return in about 5 months (around 06/25/2023) for primary care follow up.

## 2023-01-23 ENCOUNTER — Encounter: Payer: Self-pay | Admitting: Critical Care Medicine

## 2023-01-23 ENCOUNTER — Ambulatory Visit: Payer: Medicaid Other | Attending: Critical Care Medicine | Admitting: Critical Care Medicine

## 2023-01-23 VITALS — BP 120/81 | HR 68 | Wt 168.4 lb

## 2023-01-23 DIAGNOSIS — R296 Repeated falls: Secondary | ICD-10-CM | POA: Diagnosis not present

## 2023-01-23 DIAGNOSIS — F17200 Nicotine dependence, unspecified, uncomplicated: Secondary | ICD-10-CM

## 2023-01-23 DIAGNOSIS — Z5902 Unsheltered homelessness: Secondary | ICD-10-CM | POA: Diagnosis not present

## 2023-01-23 DIAGNOSIS — L600 Ingrowing nail: Secondary | ICD-10-CM | POA: Diagnosis not present

## 2023-01-23 DIAGNOSIS — J439 Emphysema, unspecified: Secondary | ICD-10-CM | POA: Diagnosis not present

## 2023-01-23 DIAGNOSIS — M542 Cervicalgia: Secondary | ICD-10-CM | POA: Diagnosis not present

## 2023-01-23 DIAGNOSIS — M7502 Adhesive capsulitis of left shoulder: Secondary | ICD-10-CM | POA: Diagnosis not present

## 2023-01-23 DIAGNOSIS — Z760 Encounter for issue of repeat prescription: Secondary | ICD-10-CM | POA: Insufficient documentation

## 2023-01-23 DIAGNOSIS — F17210 Nicotine dependence, cigarettes, uncomplicated: Secondary | ICD-10-CM | POA: Diagnosis not present

## 2023-01-23 DIAGNOSIS — G894 Chronic pain syndrome: Secondary | ICD-10-CM | POA: Diagnosis not present

## 2023-01-23 DIAGNOSIS — R918 Other nonspecific abnormal finding of lung field: Secondary | ICD-10-CM | POA: Diagnosis not present

## 2023-01-23 DIAGNOSIS — Z5900 Homelessness unspecified: Secondary | ICD-10-CM

## 2023-01-23 DIAGNOSIS — F319 Bipolar disorder, unspecified: Secondary | ICD-10-CM

## 2023-01-23 NOTE — Assessment & Plan Note (Signed)
Will monitor

## 2023-01-23 NOTE — Assessment & Plan Note (Signed)
Management per pain clinic 

## 2023-01-23 NOTE — Assessment & Plan Note (Signed)
Manage per psychiatry ?

## 2023-01-23 NOTE — Assessment & Plan Note (Signed)
Still on house living in her car

## 2023-01-23 NOTE — Assessment & Plan Note (Signed)
To undergo surgery per orthopedics

## 2023-01-23 NOTE — Assessment & Plan Note (Signed)
 Patient does not engage around smoking cessation    Current smoking consumption amount: 1 pack a day  Dicsussion on advise to quit smoking and smoking impacts: Lung and cardiovascular  Patient's willingness to quit: Not wishing to quit  Methods to quit smoking discussed: Not engaged  Medication management of smoking session drugs discussed: Not engaged    Setting quit date not established  Follow-up arranged 5 months   Time spent counseling the patient: 5 minutes

## 2023-01-23 NOTE — Patient Instructions (Signed)
No change in medications refills will be sent including narcan  Labs per preop appointment  Physical therapy for falls will be sent  Foot doctor referral will be made  Return visit 5 months

## 2023-01-24 ENCOUNTER — Telehealth: Payer: Self-pay

## 2023-01-24 NOTE — Telephone Encounter (Signed)
Patient is scheduled for shoulder surgery and CTR on February 24, 2023. [employer] has approved codes 64332 and [id number] but denied 29822 and [phone] (CTR code)   Rationale for 29822: One (1) or more of the procedures does not meet your health plan criteria because the request is considered part of the planned surgery. The following requests are not covered under your health plan as a separate procedure.  Limited debridement (use of a scope to clean out damaged tissue in the joint)  Rationale for 63016: Your records must show you have carpal tunnel syndrome in a nerve test  I could not find a EMG/NCV report in the chart. I left pt a message to call me back to see where she had it done.

## 2023-01-25 ENCOUNTER — Other Ambulatory Visit (HOSPITAL_COMMUNITY): Payer: Self-pay

## 2023-01-25 NOTE — Pre-Procedure Instructions (Signed)
Surgical Instructions   Your procedure is scheduled on Tuesday, July 30th. Report to [facility] Main Entrance "A" at 09:35 A.M., then check in with the Admitting office. Any questions or running late day of surgery: call [phone]  Questions prior to your surgery date: call [phone], Monday-Friday, 8am-4pm. If you experience any cold or flu symptoms such as cough, fever, chills, shortness of breath, etc. between now and your scheduled surgery, please notify us at the above number.     Remember:  Do not eat after midnight the night before your surgery   You may drink clear liquids until 08:35 AM the morning of your surgery.   Clear liquids allowed are: Water, Non-Citrus Juices (without pulp), Carbonated Beverages, Clear Tea, Black Coffee Only (NO MILK, CREAM OR POWDERED CREAMER of any kind), and Gatorade.  Patient Instructions  The night before surgery:  No food after midnight. ONLY clear liquids after midnight  The day of surgery (if you do NOT have diabetes):  Drink ONE (1) Pre-Surgery Clear Ensure by 08:35 AM the morning of surgery. Drink in one sitting. Do not sip.  This drink was given to you during your hospital  pre-op appointment visit.  Nothing else to drink after completing the  Pre-Surgery Clear Ensure.          If you have questions, please contact your surgeon's office.     Take these medicines the morning of surgery with A SIP OF WATER  lamoTRIgine (LAMICTAL)  omeprazole (PRILOSEC)  pravastatin (PRAVACHOL)  valACYclovir (VALTREX)     May take these medicines IF NEEDED: albuterol (VENTOLIN HFA)- bring inhaler with you on day of surgery cyclobenzaprine (FLEXERIL)  nitroGLYCERIN- if you have to take this medication prior to surgery, please call one of the above phone numbers and report this to a Nurse oxyCODONE-acetaminophen (PERCOCET)     One week prior to surgery, STOP taking any Aspirin (unless otherwise instructed by your surgeon) Aleve,  Naproxen, Ibuprofen, Motrin, Advil, Goody's, BC's, all herbal medications, fish oil, and non-prescription vitamins.                     Do NOT Smoke (Tobacco/Vaping) for 24 hours prior to your procedure.  If you use a CPAP at night, you may bring your mask/headgear for your overnight stay.   You will be asked to remove any contacts, glasses, piercing's, hearing aid's, dentures/partials prior to surgery. Please bring cases for these items if needed.    Patients discharged the day of surgery will not be allowed to drive home, and someone needs to stay with them for 24 hours.  SURGICAL WAITING ROOM VISITATION Patients may have no more than 2 support people in the waiting area - these visitors may rotate.   Pre-op nurse will coordinate an appropriate time for 1 ADULT support person, who may not rotate, to accompany patient in pre-op.  Children under the age of 71 must have an adult with them who is not the patient and must remain in the main waiting area with an adult.  If the patient needs to stay at the hospital during part of their recovery, the visitor guidelines for inpatient rooms apply.  Please refer to the [facility] website for the visitor guidelines for any additional information.   If you received a COVID test during your pre-op visit  it is requested that you wear a mask when out in public, stay away from anyone that may not be feeling well and notify your surgeon  if you develop symptoms. If you have been in contact with anyone that has tested positive in the last 10 days please notify you surgeon.      Pre-operative CHG Bathing Instructions   You can play a key role in reducing the risk of infection after surgery. Your skin needs to be as free of germs as possible. You can reduce the number of germs on your skin by washing with CHG (chlorhexidine gluconate) soap before surgery. CHG is an antiseptic soap that kills germs and continues to kill germs even after washing.   DO NOT  use if you have an allergy to chlorhexidine/CHG or antibacterial soaps. If your skin becomes reddened or irritated, stop using the CHG and notify one of our RNs at [phone].                 TAKE A SHOWER THE NIGHT BEFORE SURGERY AND THE DAY OF SURGERY    Please keep in mind the following:  DO NOT shave, including legs and underarms, 48 hours prior to surgery.   You may shave your face before/day of surgery.  Place clean sheets on your bed the night before surgery Use a clean washcloth (not used since being washed) for each shower. DO NOT sleep with pet's night before surgery.  CHG Shower Instructions:  If you choose to wash your hair and private area, wash first with your normal shampoo/soap.  After you use shampoo/soap, rinse your hair and body thoroughly to remove shampoo/soap residue.  Turn the water OFF and apply half the bottle of CHG soap to a CLEAN washcloth.  Apply CHG soap ONLY FROM YOUR NECK DOWN TO YOUR TOES (washing for 3-5 minutes)  DO NOT use CHG soap on face, private areas, open wounds, or sores.  Pay special attention to the area where your surgery is being performed.  If you are having back surgery, having someone wash your back for you may be helpful. Wait 2 minutes after CHG soap is applied, then you may rinse off the CHG soap.  Pat dry with a clean towel  Put on clean pajamas    Additional instructions for the day of surgery: DO NOT APPLY any lotions, deodorants, cologne, or perfumes.   Do not wear jewelry or makeup Do not wear nail polish, gel polish, artificial nails, or any other type of covering on natural nails (fingers and toes) Do not bring valuables to the hospital. [facility] is not responsible for valuables/personal belongings. Put on clean/comfortable clothes.  Please brush your teeth.  Ask your nurse before applying any prescription medications to the skin.

## 2023-01-26 ENCOUNTER — Inpatient Hospital Stay (HOSPITAL_COMMUNITY)
Admission: RE | Admit: 2023-01-26 | Discharge: 2023-01-26 | Disposition: A | Discharge status: Home / Self Care | Payer: Medicaid Other | Source: Ambulatory Visit

## 2023-01-29 ENCOUNTER — Other Ambulatory Visit: Payer: Self-pay

## 2023-01-29 ENCOUNTER — Other Ambulatory Visit (HOSPITAL_COMMUNITY): Payer: Self-pay

## 2023-01-29 DIAGNOSIS — M1990 Unspecified osteoarthritis, unspecified site: Secondary | ICD-10-CM | POA: Diagnosis not present

## 2023-01-29 DIAGNOSIS — M75112 Incomplete rotator cuff tear or rupture of left shoulder, not specified as traumatic: Secondary | ICD-10-CM | POA: Diagnosis not present

## 2023-01-29 DIAGNOSIS — G894 Chronic pain syndrome: Secondary | ICD-10-CM | POA: Diagnosis not present

## 2023-01-29 DIAGNOSIS — M75102 Unspecified rotator cuff tear or rupture of left shoulder, not specified as traumatic: Secondary | ICD-10-CM | POA: Diagnosis not present

## 2023-01-29 MED ORDER — OXYCODONE-ACETAMINOPHEN 10-325 MG PO TABS
1.0000 | ORAL_TABLET | Freq: Four times a day (QID) | ORAL | 0 refills | Status: DC
  Filled 2023-01-29: qty 120, 30d supply, fill #0

## 2023-01-29 NOTE — Telephone Encounter (Signed)
Holding as a reminder

## 2023-01-30 ENCOUNTER — Ambulatory Visit: Payer: Medicaid Other | Admitting: Critical Care Medicine

## 2023-01-31 ENCOUNTER — Ambulatory Visit: Payer: Medicaid Other

## 2023-02-01 ENCOUNTER — Encounter (HOSPITAL_COMMUNITY)
Admission: RE | Admit: 2023-02-01 | Discharge: 2023-02-01 | Disposition: A | Discharge status: Home / Self Care | Payer: Medicaid Other | Source: Ambulatory Visit | Attending: Orthopedic Surgery | Admitting: Orthopedic Surgery

## 2023-02-01 ENCOUNTER — Encounter (HOSPITAL_COMMUNITY): Payer: Self-pay

## 2023-02-01 DIAGNOSIS — Z01812 Encounter for preprocedural laboratory examination: Secondary | ICD-10-CM | POA: Diagnosis present

## 2023-02-01 HISTORY — DX: Unspecified osteoarthritis, unspecified site: M19.90

## 2023-02-01 HISTORY — DX: Gastro-esophageal reflux disease without esophagitis: K21.9

## 2023-02-01 HISTORY — DX: Chronic obstructive pulmonary disease, unspecified: J44.9

## 2023-02-01 HISTORY — DX: Bipolar disorder, unspecified: F31.9

## 2023-02-01 LAB — BASIC METABOLIC PANEL
Anion gap: 5 (ref 5–15)
BUN: 10 mg/dL (ref 6–20)
CO2: 27 mmol/L (ref 22–32)
Calcium: 8.5 mg/dL — ABNORMAL LOW (ref 8.9–10.3)
Chloride: 105 mmol/L (ref 98–111)
Creatinine, Ser: 1.03 mg/dL — ABNORMAL HIGH (ref 0.44–1.00)
GFR, Estimated: >60 mL/min (ref >60)
Glucose, Bld: 115 mg/dL — ABNORMAL HIGH (ref 70–99)
Potassium: 3.7 mmol/L (ref 3.5–5.1)
Sodium: 137 mmol/L (ref 135–145)

## 2023-02-01 LAB — CBC
HCT: 36.9 % (ref 36.0–46.0)
Hemoglobin: 11.9 g/dL — ABNORMAL LOW (ref 12.0–15.0)
MCH: 30.2 pg (ref 26.0–34.0)
MCHC: 32.2 g/dL (ref 30.0–36.0)
MCV: 93.7 fL (ref 80.0–100.0)
Platelets: 347 10*3/uL (ref 150–400)
RBC: 3.94 MIL/uL (ref 3.87–5.11)
RDW: 12.9 % (ref 11.5–15.5)
WBC: 7 10*3/uL (ref 4.0–10.5)
nRBC: 0 % (ref 0.0–0.2)

## 2023-02-01 NOTE — Progress Notes (Signed)
PCP -  Dr. Shan Levans Cardiologist - Denies  PPM/ICD - Denies Device Orders - N/A Rep Notified - N/A  Chest x-ray - 09-05-19 EKG - 11-22-16 Stress Test - 11-22-16 ECHO - Denies Cardiac Cath - Denies  Sleep Study - Denies CPAP - N/A  DM- Denies  Blood Thinner Instructions:Denies Aspirin Instructions:Denies  ERAS Protcol -yes with drink PRE-SURGERY Ensure   COVID TEST- N/A   Anesthesia review: NO  Patient denies shortness of breath, fever, cough and chest pain at PAT appointment   All instructions explained to the patient, with a verbal understanding of the material. Patient agrees to go over the instructions while at home for a better understanding. Patient also instructed to self quarantine after being tested for COVID-19. The opportunity to ask questions was provided.

## 2023-02-02 ENCOUNTER — Ambulatory Visit: Payer: Medicaid Other | Admitting: Orthopedic Surgery

## 2023-02-02 ENCOUNTER — Encounter: Payer: Self-pay | Admitting: Orthopedic Surgery

## 2023-02-02 DIAGNOSIS — M75112 Incomplete rotator cuff tear or rupture of left shoulder, not specified as traumatic: Secondary | ICD-10-CM

## 2023-02-02 DIAGNOSIS — G5602 Carpal tunnel syndrome, left upper limb: Secondary | ICD-10-CM | POA: Diagnosis not present

## 2023-02-02 NOTE — Telephone Encounter (Signed)
I just dictated note - should be sufficient

## 2023-02-02 NOTE — Progress Notes (Signed)
Office Visit Note   Patient: Katie Sherman           Date of Birth: 1965/10/20           MRN: [id number] Visit Date: 02/02/2023 Requested by: Storm Frisk, MD [address] [place],  [place] [address] PCP: Storm Frisk, MD  Subjective: Chief Complaint  Patient presents with   Left Shoulder - Pain    HPI: Katie Sherman is a 57 y.o. female who presents to the office reporting left shoulder pain and left wrist pain.  She has known shoulder pathology and scheduled to have shoulder surgery next week.  Patient also has carpal tunnel symptoms.  She did undergo nerve conduction study testing in 2019 which did show mild carpal tunnel syndrome bilaterally.  She currently reports that her symptoms have worsened since then.  Describes daily numbness and tingling affecting the palmar side of digits 1 2 and 3 on the left hand more than the right hand.  Denies much in the way of neck pain or radicular symptoms she states she has loss of dexterity.  Patient did have cortisone shot x 1 in both wrist in 2019 and recently had another cortisone injection.  First injections helped second injections gave no relief.  Both done under ultrasound guidance elsewhere..                ROS: All systems reviewed are negative as they relate to the chief complaint within the history of present illness.  Patient denies fevers or chills.  Assessment & Plan: Visit Diagnoses:  1. Incomplete rotator cuff tear or rupture of left shoulder, not specified as traumatic   2. Carpal tunnel syndrome, left upper limb     Plan: Impression is left shoulder pain with surgery pending for Tuesday.  Patient also has carpal tunnel syndrome which has been refractory to nonoperative management.  Has classic signs and symptoms of carpal tunnel syndrome.  Nerve conduction study +5 years ago with progressive worsening of symptoms since that time.  Recommend for carpal tunnel release at the time of her shoulder surgery.   Risk and benefits are discussed.  All questions answered.  Follow-Up Instructions: No follow-ups on file.   Orders:  No orders of the defined types were placed in this encounter.  No orders of the defined types were placed in this encounter.     Procedures: No procedures performed   Clinical Data: No additional findings.  Objective: Vital Signs: LMP 04/29/2012   Physical Exam:  Constitutional: Patient appears well-developed HEENT:  Head: Normocephalic Eyes:EOM are normal Neck: Normal range of motion Cardiovascular: Normal rate Pulmonary/chest: Effort normal Neurologic: Patient is alert Skin: Skin is warm Psychiatric: Patient has normal mood and affect  Ortho Exam: Ortho exam demonstrates positive Phalen's positive Tinel's on the left-hand side.  Patient does have paresthesias in the median distribution on the left compared to the right.  Abductor pollicis brevis strength is 5+ out of 5 bilaterally.  Negative Tinel's or ulnar nerve instability at the elbow.  No other masses lymphadenopathy or skin changes noted in that hand region.  Specialty Comments:  No specialty comments available.  Imaging: No results found.   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   Postmenopausal 01/22/2017   Bipolar 1 disorder, depressed (HCC) 12/29/2016   Family history of suicide 11/20/2016   Depression 11/20/2016   Family history of breast cancer 11/20/2016   Adhesive capsulitis of left shoulder 11/19/2016   Greater trochanteric bursitis of right hip 11/19/2016   Past Medical History:  Diagnosis Date   Arthritis    Bilateral carpal tunnel  syndrome 03/19/2018   Bipolar disorder (HCC)    per patient   Cervical cancer (HCC)    Cervical cancer (HCC)    COPD (chronic obstructive pulmonary disease) (HCC)    emphysema mild per patient   GERD (gastroesophageal reflux disease)    Hemoptysis 01/11/2021   History of exercise stress test    ETT-Echo 5/18: normal EF, no ischemia   History of loop recorder    currently on person   Kidney stone    Pharyngeal dysphagia 01/11/2021    Family History  Problem Relation Age of Onset   Heart attack Mother 58   Heart disease Father    Heart attack Father 30   Liver cancer Father    Bone cancer Other    Breast cancer Maternal Aunt        Diagnosed in 29's   Bone cancer Maternal Aunt     Past Surgical History:  Procedure Laterality Date   CERVICAL CONE BIOPSY     TUBAL LIGATION     Social History   Occupational History   Occupation: unemployed  Tobacco Use   Smoking status: Every Day    Current packs/day: 1.00    Average packs/day: 1 pack/day for 43.6 years (43.6 ttl pk-yrs)    Types: Cigarettes    Start date: 1981   Smokeless tobacco: Never   Tobacco comments:    Smokes 1 pack a day [facility]J 01/20/21  Vaping Use   Vaping status: Never Used  Substance and Sexual Activity   Alcohol use: No   Drug use: Yes    Types: Marijuana    Comment: occasionally   Sexual activity: Not Currently    Birth control/protection: None

## 2023-02-05 ENCOUNTER — Telehealth: Payer: Self-pay | Admitting: Orthopedic Surgery

## 2023-02-05 NOTE — Telephone Encounter (Signed)
Patient's surgery was cancelled due to shortage of CRNAs and dividing our schedule with another physician in the practice.  Patient is aware that the left carpal tunnel release has not yet been approved.Our office had arranged a P2P with Dr. August Saucer.  The P2P did not take place because the insurance company hung up while waiting to get the doctor to the phone.    Patient would like to have the carpal tunnel surgery and shoulder surgery at the same time. We are working to get her scheduled soon as she experiencing anxiety having to wait this long and having increased pain in both shoulder and wrist.

## 2023-02-06 ENCOUNTER — Ambulatory Visit (HOSPITAL_COMMUNITY): Admission: RE | Admit: 2023-02-06 | Payer: Medicaid Other | Source: Home / Self Care | Admitting: Orthopedic Surgery

## 2023-02-06 DIAGNOSIS — Z01818 Encounter for other preprocedural examination: Secondary | ICD-10-CM

## 2023-02-06 SURGERY — SHOULDER ARTHROSCOPY WITH ROTATOR CUFF REPAIR AND SUBACROMIAL DECOMPRESSION
Anesthesia: General | Laterality: Left

## 2023-02-06 NOTE — Telephone Encounter (Signed)
Thx  for  info

## 2023-02-07 ENCOUNTER — Other Ambulatory Visit: Payer: Self-pay | Admitting: Critical Care Medicine

## 2023-02-07 ENCOUNTER — Other Ambulatory Visit (HOSPITAL_COMMUNITY): Payer: Self-pay

## 2023-02-12 ENCOUNTER — Ambulatory Visit: Payer: Medicaid Other | Admitting: Podiatry

## 2023-02-14 ENCOUNTER — Other Ambulatory Visit: Payer: Self-pay | Admitting: Critical Care Medicine

## 2023-02-14 ENCOUNTER — Encounter: Payer: Medicaid Other | Admitting: Surgical

## 2023-02-14 MED ORDER — NALOXONE HCL 4 MG/0.1ML NA LIQD
NASAL | 0 refills | Status: AC
  Filled 2023-02-14: qty 2, 1d supply, fill #0

## 2023-02-14 MED ORDER — VITAMIN D (ERGOCALCIFEROL) 1.25 MG (50000 UNIT) PO CAPS
50000.0000 [IU] | ORAL_CAPSULE | ORAL | 5 refills | Status: DC
  Filled 2023-02-14: qty 5, 35d supply, fill #0
  Filled 2023-04-02: qty 5, 35d supply, fill #1
  Filled 2023-05-08: qty 5, 35d supply, fill #2
  Filled 2023-06-19: qty 5, 35d supply, fill #3
  Filled 2023-07-19: qty 5, 35d supply, fill #4
  Filled 2023-08-26: qty 5, 35d supply, fill #5

## 2023-02-15 ENCOUNTER — Other Ambulatory Visit (HOSPITAL_COMMUNITY): Payer: Self-pay

## 2023-02-15 ENCOUNTER — Other Ambulatory Visit: Payer: Self-pay

## 2023-02-28 ENCOUNTER — Other Ambulatory Visit (HOSPITAL_COMMUNITY): Payer: Self-pay

## 2023-02-28 DIAGNOSIS — M1990 Unspecified osteoarthritis, unspecified site: Secondary | ICD-10-CM | POA: Diagnosis not present

## 2023-02-28 DIAGNOSIS — M75112 Incomplete rotator cuff tear or rupture of left shoulder, not specified as traumatic: Secondary | ICD-10-CM | POA: Diagnosis not present

## 2023-02-28 DIAGNOSIS — G894 Chronic pain syndrome: Secondary | ICD-10-CM | POA: Diagnosis not present

## 2023-02-28 MED ORDER — OXYCODONE-ACETAMINOPHEN 10-325 MG PO TABS
1.0000 | ORAL_TABLET | Freq: Four times a day (QID) | ORAL | 0 refills | Status: DC | PRN
  Filled 2023-02-28: qty 120, 30d supply, fill #0

## 2023-03-06 ENCOUNTER — Other Ambulatory Visit: Payer: Self-pay | Admitting: Critical Care Medicine

## 2023-03-06 ENCOUNTER — Other Ambulatory Visit (HOSPITAL_COMMUNITY): Payer: Self-pay

## 2023-03-06 ENCOUNTER — Other Ambulatory Visit: Payer: Self-pay

## 2023-03-06 MED ORDER — PRAVASTATIN SODIUM 40 MG PO TABS
40.0000 mg | ORAL_TABLET | Freq: Every day | ORAL | 1 refills | Status: DC
  Filled 2023-03-06 – 2023-03-19 (×2): qty 90, 90d supply, fill #0

## 2023-03-07 ENCOUNTER — Other Ambulatory Visit (HOSPITAL_COMMUNITY): Payer: Self-pay

## 2023-03-15 ENCOUNTER — Other Ambulatory Visit: Payer: Self-pay | Admitting: Critical Care Medicine

## 2023-03-15 ENCOUNTER — Other Ambulatory Visit (HOSPITAL_COMMUNITY): Payer: Self-pay

## 2023-03-15 DIAGNOSIS — K219 Gastro-esophageal reflux disease without esophagitis: Secondary | ICD-10-CM

## 2023-03-15 MED ORDER — OMEPRAZOLE 20 MG PO CPDR
20.0000 mg | DELAYED_RELEASE_CAPSULE | Freq: Every day | ORAL | 0 refills | Status: DC
Start: 2023-03-15 — End: 2023-06-11
  Filled 2023-03-15: qty 90, 90d supply, fill #0

## 2023-03-19 ENCOUNTER — Other Ambulatory Visit (HOSPITAL_COMMUNITY): Payer: Self-pay

## 2023-03-23 ENCOUNTER — Telehealth: Payer: Self-pay | Admitting: Orthopedic Surgery

## 2023-03-23 NOTE — Telephone Encounter (Signed)
Patient called. Would like a sling for her L shoulder. Her cb# is [phone]

## 2023-03-25 NOTE — Telephone Encounter (Signed)
That is okay with me but I would definitely caution her against using the sling at all times in order to avoid frozen shoulder.  Otherwise, using the sling constantly will likely make her shoulder more stiff and painful.

## 2023-03-26 ENCOUNTER — Telehealth (HOSPITAL_BASED_OUTPATIENT_CLINIC_OR_DEPARTMENT_OTHER): Payer: Medicaid Other | Admitting: Psychiatry

## 2023-03-26 ENCOUNTER — Encounter (HOSPITAL_COMMUNITY): Payer: Self-pay | Admitting: Psychiatry

## 2023-03-26 ENCOUNTER — Other Ambulatory Visit (HOSPITAL_COMMUNITY): Payer: Self-pay

## 2023-03-26 VITALS — Wt 168.0 lb

## 2023-03-26 DIAGNOSIS — F319 Bipolar disorder, unspecified: Secondary | ICD-10-CM

## 2023-03-26 DIAGNOSIS — F4310 Post-traumatic stress disorder, unspecified: Secondary | ICD-10-CM | POA: Diagnosis not present

## 2023-03-26 MED ORDER — QUETIAPINE FUMARATE 100 MG PO TABS
100.0000 mg | ORAL_TABLET | Freq: Every day | ORAL | 2 refills | Status: DC
Start: 2023-03-26 — End: 2023-09-24
  Filled 2023-03-26: qty 30, 30d supply, fill #0

## 2023-03-26 MED ORDER — LAMOTRIGINE 100 MG PO TABS
300.0000 mg | ORAL_TABLET | Freq: Every day | ORAL | 2 refills | Status: DC
Start: 2023-03-26 — End: 2023-06-11
  Filled 2023-03-26: qty 90, 30d supply, fill #0
  Filled 2023-05-08: qty 90, 30d supply, fill #1
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
## 2023-03-27 ENCOUNTER — Encounter (HOSPITAL_BASED_OUTPATIENT_CLINIC_OR_DEPARTMENT_OTHER): Payer: Self-pay

## 2023-03-30 ENCOUNTER — Other Ambulatory Visit (HOSPITAL_COMMUNITY): Payer: Self-pay

## 2023-04-02 ENCOUNTER — Other Ambulatory Visit (HOSPITAL_COMMUNITY): Payer: Self-pay

## 2023-04-02 ENCOUNTER — Other Ambulatory Visit: Payer: Self-pay

## 2023-04-02 MED ORDER — MELOXICAM 7.5 MG PO TABS
7.5000 mg | ORAL_TABLET | Freq: Every day | ORAL | 0 refills | Status: DC
  Filled 2023-04-02: qty 30, 30d supply, fill #0

## 2023-04-02 MED ORDER — OXYCODONE-ACETAMINOPHEN 10-325 MG PO TABS
1.0000 | ORAL_TABLET | Freq: Four times a day (QID) | ORAL | 0 refills | Status: DC
  Filled 2023-04-02: qty 120, 30d supply, fill #0

## 2023-04-02 NOTE — Telephone Encounter (Signed)
How do we find out the status of her surgery appeal?

## 2023-04-10 ENCOUNTER — Ambulatory Visit: Payer: Medicaid Other | Admitting: Orthopedic Surgery

## 2023-04-10 ENCOUNTER — Encounter: Payer: Self-pay | Admitting: Orthopedic Surgery

## 2023-04-10 DIAGNOSIS — I872 Venous insufficiency (chronic) (peripheral): Secondary | ICD-10-CM

## 2023-04-10 NOTE — Progress Notes (Signed)
Office Visit Note   Patient: Katie Sherman           Date of Birth: 12-02-1965           MRN: [id number] Visit Date: 04/10/2023              Requested by: Storm Frisk, MD [address] [place],  [place] [address] PCP: Storm Frisk, MD  No chief complaint on file.     HPI: Patient is a 57 year old woman who is seen for initial evaluation for painful venous stasis insufficiency swelling both lower extremities right worse than left.  Patient states that she is trying to schedule arthroscopic surgery for her shoulder and carpal tunnel release for her wrist.  Patient states she has tried compression socks in the past but they were uncomfortable.  Assessment & Plan: Visit Diagnoses:  1. Venous stasis dermatitis of both lower extremities     Plan: Discussed using knee-high compression socks versus a multilayer compression wrap.  Patient states she would like to proceed with the multilayer compression wrap.  We will apply the wrap to the right leg follow-up in 1 week to evaluate for compression stockings versus a new wrap.  Follow-Up Instructions: Return in about 1 week (around 04/17/2023).   Ortho Exam  Patient is alert, oriented, no adenopathy, well-dressed, normal affect, normal respiratory effort. Examination of both legs she does have venous stasis dermatitis worse on the right leg and the left leg but there is no cellulitis.  There is pitting edema up to the tibial tubercle and this is tender to palpation.  There is no weeping edema.  No open ulcers.  Imaging: No results found. No images are attached to the encounter.  Labs: Lab Results  Component Value Date   HGBA1C 5.8 (H) 08/29/2022   HGBA1C 5.7 (H) 02/07/2022   HGBA1C 5.3 01/01/2020   ESRSEDRATE 12 09/05/2021   CRP 5 09/05/2021     Lab Results  Component Value Date   ALBUMIN 4.6 08/29/2022   ALBUMIN 4.5 02/07/2022   ALBUMIN 4.4 09/05/2021    No results found for: "MG" Lab Results   Component Value Date   VD25OH 33.7 02/18/2019   VD25OH 28.8 (L) 01/24/2017    No results found for: "PREALBUMIN"    Latest Ref Rng & Units 02/01/2023    4:00 PM 08/29/2022    2:02 PM 01/31/2022    7:41 AM  CBC EXTENDED  WBC 4.0 - 10.5 K/uL 7.0  7.4  6.8   RBC 3.87 - 5.11 MIL/uL 3.94  4.34  3.94   Hemoglobin 12.0 - 15.0 g/dL 96.2  95.2  84.1   HCT 36.0 - 46.0 % 36.9  41.4  37.2   Platelets 150 - 400 K/uL 347  382  339   NEUT# 1.4 - 7.0 x10E3/uL  3.0  2.5   Lymph# 0.7 - 3.1 x10E3/uL  3.7  3.4      There is no height or weight on file to calculate BMI.  Orders:  No orders of the defined types were placed in this encounter.  No orders of the defined types were placed in this encounter.    Procedures: No procedures performed  Clinical Data: No additional findings.  ROS:  All other systems negative, except as noted in the HPI. Review of Systems  Objective: Vital Signs: LMP 04/29/2012   Specialty Comments:  No specialty comments available.  PMFS History: Patient Active Problem List   Diagnosis Date  Noted   Homelessness 02/07/2022   Chronic pain syndrome 02/07/2022   Costochondritis 09/05/2021   Tobacco dependence 08/07/2020   Lung nodules 02/16/2020   Vitamin D deficiency 02/19/2019   Bilateral carpal tunnel syndrome 03/19/2018   Mixed hyperlipidemia 11/14/2017   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   Postmenopausal 01/22/2017   Bipolar 1 disorder, depressed (HCC) 12/29/2016   Family history of suicide 11/20/2016   Depression 11/20/2016   Family history of breast cancer 11/20/2016   Adhesive capsulitis of left shoulder 11/19/2016   Greater trochanteric bursitis of right hip 11/19/2016   Past Medical History:  Diagnosis Date   Arthritis    Bilateral carpal tunnel syndrome 03/19/2018   Bipolar disorder (HCC)    per patient   Cervical cancer (HCC)    Cervical cancer (HCC)    COPD (chronic obstructive pulmonary disease) (HCC)    emphysema  mild per patient   GERD (gastroesophageal reflux disease)    Hemoptysis 01/11/2021   History of exercise stress test    ETT-Echo 5/18: normal EF, no ischemia   History of loop recorder    currently on person   Kidney stone    Pharyngeal dysphagia 01/11/2021    Family History  Problem Relation Age of Onset   Heart attack Mother 70   Heart disease Father    Heart attack Father 50   Liver cancer Father    Bone cancer Other    Breast cancer Maternal Aunt        Diagnosed in 55's   Bone cancer Maternal Aunt     Past Surgical History:  Procedure Laterality Date   CERVICAL CONE BIOPSY     TUBAL LIGATION     Social History   Occupational History   Occupation: unemployed  Tobacco Use   Smoking status: Every Day    Current packs/day: 1.00    Average packs/day: 1 pack/day for 43.7 years (43.7 ttl pk-yrs)    Types: Cigarettes    Start date: 1981   Smokeless tobacco: Never   Tobacco comments:    Smokes 1 pack a day ARJ 01/20/21  Vaping Use   Vaping status: Never Used  Substance and Sexual Activity   Alcohol use: No   Drug use: Yes    Types: Marijuana    Comment: occasionally   Sexual activity: Not Currently    Birth control/protection: None

## 2023-04-18 ENCOUNTER — Telehealth: Payer: Self-pay | Admitting: Orthopedic Surgery

## 2023-04-18 ENCOUNTER — Ambulatory Visit: Payer: Medicaid Other | Admitting: Surgical

## 2023-04-18 DIAGNOSIS — M7989 Other specified soft tissue disorders: Secondary | ICD-10-CM

## 2023-04-18 NOTE — Telephone Encounter (Signed)
Patient unable to keep appointment with Katie Sherman today at 3pm due to a family emergency. She is asking if she can be rescheduled to tomorrow or Friday.  Please call patient to advise on appointment prior to weekend.  No openings available with Dr. August Sherman or Katie Sherman for the next two days.  I did explain to patient the need for clinic staff to advise on working her in or to override if she must be seen this week.  Patient's cell [phone]

## 2023-04-18 NOTE — Telephone Encounter (Signed)
Based on our conversation last night over MyChart messaging, she really needs ultrasound to rule out DVT based on the swelling in her leg.  I do think that if that is negative, she would be better served by following up with Dr. Lajoyce Corners then myself.  Lets see how the ultrasound results pan out and then we can reassess follow-up.  I think getting the ultrasound though is priority #1.  I did try to call her last night to discuss this with her but she did not pick up.

## 2023-04-18 NOTE — Addendum Note (Signed)
Addended by: Barbette Or on: 04/18/2023 04:26 PM   Modules accepted: Orders

## 2023-04-19 ENCOUNTER — Ambulatory Visit (HOSPITAL_BASED_OUTPATIENT_CLINIC_OR_DEPARTMENT_OTHER): Payer: Medicaid Other

## 2023-04-19 DIAGNOSIS — M7989 Other specified soft tissue disorders: Secondary | ICD-10-CM | POA: Diagnosis not present

## 2023-04-19 NOTE — Telephone Encounter (Signed)
Pt is aware of appt scehduled today at 3pm for 330pm appt at heart and vascular at [facility].

## 2023-04-20 ENCOUNTER — Ambulatory Visit (INDEPENDENT_AMBULATORY_CARE_PROVIDER_SITE_OTHER): Payer: Medicaid Other | Admitting: Surgical

## 2023-04-20 ENCOUNTER — Telehealth: Payer: Self-pay | Admitting: Surgical

## 2023-04-20 ENCOUNTER — Other Ambulatory Visit (HOSPITAL_COMMUNITY): Payer: Self-pay

## 2023-04-20 DIAGNOSIS — M7989 Other specified soft tissue disorders: Secondary | ICD-10-CM | POA: Diagnosis not present

## 2023-04-20 MED ORDER — DOXYCYCLINE HYCLATE 100 MG PO CAPS
100.0000 mg | ORAL_CAPSULE | Freq: Two times a day (BID) | ORAL | 0 refills | Status: DC
  Filled 2023-04-20: qty 20, 10d supply, fill #0

## 2023-04-20 NOTE — Telephone Encounter (Signed)
I called pt and she states that she does not have a wound that she can see but that the leg "is wet" I advised that we would nee dto see her in the office today and that she will need compression to the leg. She will come in today at 3pm

## 2023-04-20 NOTE — Telephone Encounter (Signed)
Patient called and said that after the visit yesterday her wound is now busted and leaking. [phone]

## 2023-04-20 NOTE — Progress Notes (Signed)
Follow-up Office Visit Note   Patient: Katie Sherman           Date of Birth: 10/30/1965           MRN: [id number] Visit Date: 04/20/2023 Requested by: Storm Frisk, MD [address] [place],  [place] [address] PCP: Storm Frisk, MD  Subjective: Chief Complaint  Patient presents with   Right Leg - Wound Check    HPI: Katie Sherman is a 57 y.o. female who returns to the office for follow-up visit.    Plan at last visit was: Discussed using knee-high compression socks versus a multilayer compression wrap. Patient states she would like to proceed with the multilayer compression wrap. We will apply the wrap to the right leg follow-up in 1 week to evaluate for compression stockings versus a new wrap.   Since then, patient notes she wore the compressive dressing/wrapping but states that her swelling has progressed.  She describes swelling throughout the entirety of her right calf.  No left-sided calf swelling.  She has increased redness in the distal aspect of her calf primarily on the anterior medial aspect of the shin just above the ankle.  She denies any fevers or chills.  Has not had any drainage from her leg or weeping until this afternoon about a couple hours ago where she had a small amount of yellow drainage that came from 1 area on the medial aspect of her shin.  She has been able to weight-bear without assistance.  Not waking with pain at night.  No recent injury.              ROS: All systems reviewed are negative as they relate to the chief complaint within the history of present illness.  Patient denies fevers or chills.  Assessment & Plan: Visit Diagnoses:  1. Right leg swelling     Plan: Katie Sherman is a 57 y.o. female who returns to the office for follow-up visit.  Plan from last visit was noted above in HPI.  They now return with no significant improvement in symptoms.  She reports worsening of symptoms especially this week.  She called to the  office earlier this week and we got her set up for ultrasound which was negative for DVT.  Today on exam, seems more like cellulitis kind of presentation.  She has diffuse edema and increased warmth throughout areas of erythema.  Ultrasound was applied throughout the entirety of the calf and shin demonstrating cobblestone appearance consistent with subcutaneous edema but no focal abscess or fluid pocket requiring surgical decompression.  Plan is to have patient take doxycycline 100 mg twice daily and follow-up with Dr. Lajoyce Corners in about a week.  Patient is intending to have rotator cuff repair and carpal tunnel release with Dr. August Saucer but want to have this resolved before undergoing elective surgery.  Strict return precautions were discussed with the patient including increased fever, chills, drainage, worsening pain or redness; if this happens, she should call the office on-call number or head to the ER.  Follow-Up Instructions: No follow-ups on file.   Orders:  No orders of the defined types were placed in this encounter.  Meds ordered this encounter  Medications   doxycycline (VIBRAMYCIN) 100 MG capsule    Sig: Take 1 capsule (100 mg total) by mouth 2 (two) times daily.    Dispense:  20 capsule    Refill:  0      Procedures: No  procedures performed   Clinical Data: No additional findings.  Objective: Vital Signs: LMP 04/29/2012   Physical Exam:  Constitutional: Patient appears well-developed HEENT:  Head: Normocephalic Eyes:EOM are normal Neck: Normal range of motion Cardiovascular: Normal rate Pulmonary/chest: Effort normal Neurologic: Patient is alert Skin: Skin is warm Psychiatric: Patient has normal mood and affect  Ortho Exam: Ortho exam demonstrates right calf with significant edema diffusely throughout the entirety of the calf compared with the left.  There is redness primarily at the distal aspect of the calf anteriorly.  There is no expressible drainage from any region of  the calf or ankle.  With elevation of the extremity above the level of her heart, the redness somewhat improved but does not resolved.  She has diffuse calf tenderness.  No significant pain with passive motion of the ankle joint.  No knee effusion noted.  Palpable DP pulse.  Specialty Comments:  No specialty comments available.  Imaging: No results found.   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   Postmenopausal 01/22/2017   Bipolar 1 disorder, depressed (HCC) 12/29/2016   Family history of suicide 11/20/2016   Depression 11/20/2016   Family history of breast cancer 11/20/2016   Adhesive capsulitis of left shoulder 11/19/2016   Greater trochanteric bursitis of right hip 11/19/2016   Past Medical History:  Diagnosis Date   Arthritis    Bilateral carpal tunnel syndrome 03/19/2018   Bipolar disorder (HCC)    per patient   Cervical cancer (HCC)    Cervical cancer (HCC)    COPD (chronic obstructive pulmonary disease) (HCC)    emphysema mild per patient   GERD (gastroesophageal reflux disease)    Hemoptysis 01/11/2021   History of exercise stress test    ETT-Echo 5/18: normal EF, no ischemia   History of loop recorder    currently on person   Kidney stone    Pharyngeal dysphagia 01/11/2021    Family History  Problem Relation Age of Onset   Heart attack Mother 45   Heart disease Father    Heart attack Father 68   Liver cancer Father    Bone cancer Other    Breast cancer Maternal Aunt        Diagnosed in 33's   Bone cancer Maternal Aunt     Past Surgical History:  Procedure Laterality Date   CERVICAL CONE BIOPSY     TUBAL LIGATION      Social History   Occupational History   Occupation: unemployed  Tobacco Use   Smoking status: Every Day    Current packs/day: 1.00    Average packs/day: 1 pack/day for 43.8 years (43.8 ttl pk-yrs)    Types: Cigarettes    Start date: 1981   Smokeless tobacco: Never   Tobacco comments:    Smokes 1 pack a day ARJ 01/20/21  Vaping Use   Vaping status: Never Used  Substance and Sexual Activity   Alcohol use: No   Drug use: Yes    Types: Marijuana    Comment: occasionally   Sexual activity: Not Currently    Birth control/protection: None

## 2023-04-21 ENCOUNTER — Encounter: Payer: Self-pay | Admitting: Surgical

## 2023-04-23 ENCOUNTER — Other Ambulatory Visit: Payer: Self-pay | Admitting: Surgical

## 2023-04-23 ENCOUNTER — Other Ambulatory Visit (HOSPITAL_COMMUNITY): Payer: Self-pay

## 2023-04-23 ENCOUNTER — Telehealth: Payer: Self-pay | Admitting: Surgical

## 2023-04-23 MED ORDER — CEPHALEXIN 500 MG PO CAPS
500.0000 mg | ORAL_CAPSULE | Freq: Four times a day (QID) | ORAL | 0 refills | Status: AC
  Filled 2023-04-23: qty 40, 10d supply, fill #0

## 2023-04-23 NOTE — Telephone Encounter (Signed)
Pt stated the antibiotic is making her feel sick she took it 2 days in a row and was sick both days please advise she would like a call about the next medication pt stated something about wanting a Z pack please advise

## 2023-04-23 NOTE — Telephone Encounter (Signed)
Okay to stop doxycycline. Sent in RX for keflex

## 2023-04-24 NOTE — Telephone Encounter (Signed)
Patient advised this was done

## 2023-04-27 ENCOUNTER — Ambulatory Visit: Payer: Medicaid Other | Admitting: Surgical

## 2023-04-27 ENCOUNTER — Telehealth: Payer: Self-pay | Admitting: Orthopedic Surgery

## 2023-04-27 NOTE — Telephone Encounter (Signed)
Called patient left message to return call concerning R/S her appointment due to scheduling conflict per patient request

## 2023-04-30 ENCOUNTER — Ambulatory Visit: Payer: Medicaid Other | Admitting: Orthopedic Surgery

## 2023-04-30 ENCOUNTER — Other Ambulatory Visit (HOSPITAL_COMMUNITY): Payer: Self-pay

## 2023-04-30 DIAGNOSIS — G894 Chronic pain syndrome: Secondary | ICD-10-CM | POA: Diagnosis not present

## 2023-04-30 DIAGNOSIS — M1990 Unspecified osteoarthritis, unspecified site: Secondary | ICD-10-CM | POA: Diagnosis not present

## 2023-04-30 DIAGNOSIS — M75112 Incomplete rotator cuff tear or rupture of left shoulder, not specified as traumatic: Secondary | ICD-10-CM | POA: Diagnosis not present

## 2023-04-30 DIAGNOSIS — M79604 Pain in right leg: Secondary | ICD-10-CM | POA: Diagnosis not present

## 2023-04-30 DIAGNOSIS — Z79891 Long term (current) use of opiate analgesic: Secondary | ICD-10-CM | POA: Diagnosis not present

## 2023-04-30 MED ORDER — OXYCODONE-ACETAMINOPHEN 10-325 MG PO TABS
1.0000 | ORAL_TABLET | Freq: Four times a day (QID) | ORAL | 0 refills | Status: DC | PRN
  Filled 2023-04-30: qty 120, 30d supply, fill #0

## 2023-05-07 ENCOUNTER — Ambulatory Visit: Payer: Medicaid Other | Admitting: Orthopedic Surgery

## 2023-05-09 NOTE — Telephone Encounter (Signed)
Hey [place] can you make appointment for [facility] to see Dr. Lajoyce Corners?  Thank you

## 2023-05-21 ENCOUNTER — Encounter (HOSPITAL_COMMUNITY): Payer: Self-pay

## 2023-05-21 NOTE — Progress Notes (Signed)
Surgical Instructions   Your procedure is scheduled on Tuesday May 29, 2023. Report to [facility] Main Entrance "A" at 9:00 A.M., then check in with the Admitting office. Any questions or running late day of surgery: call [phone]  Questions prior to your surgery date: call [phone], Monday-Friday, 8am-4pm. If you experience any cold or flu symptoms such as cough, fever, chills, shortness of breath, etc. between now and your scheduled surgery, please notify us at the above number.     Remember:  Do not eat after midnight the night before your surgery  You may drink clear liquids until 8:00 the morning of your surgery.   Clear liquids allowed are: Water, Non-Citrus Juices (without pulp), Carbonated Beverages, Clear Tea (no milk, honey, etc.), Black Coffee Only (NO MILK, CREAM OR POWDERED CREAMER of any kind), and Gatorade.    Take these medicines the morning of surgery with A SIP OF WATER  lamoTRIgine (LAMICTAL)  omeprazole (PRILOSEC)  pravastatin (PRAVACHOL)  valACYclovir (VALTREX)    May take these medicines IF NEEDED: albuterol (VENTOLIN HFA) 108 (90 Base) MCG/ACT inhaler. Please bring with you to the hospital.  cyclobenzaprine (FLEXERIL)  oxyCODONE-acetaminophen (PERCOCET)    One week prior to surgery, STOP taking any Aspirin (unless otherwise instructed by your surgeon) Aleve, Naproxen, Ibuprofen, Motrin, Advil, Goody's, BC's, all herbal medications, fish oil, and non-prescription vitamins.  This includes your meloxicam (MOBIC).                       Do NOT Smoke (Tobacco/Vaping) for 24 hours prior to your procedure.  If you use a CPAP at night, you may bring your mask/headgear for your overnight stay.   You will be asked to remove any contacts, glasses, piercing's, hearing aid's, dentures/partials prior to surgery. Please bring cases for these items if needed.    Patients discharged the day of surgery will not be allowed to drive home, and someone needs to  stay with them for 24 hours.  SURGICAL WAITING ROOM VISITATION Patients may have no more than 2 support people in the waiting area - these visitors may rotate.   Pre-op nurse will coordinate an appropriate time for 1 ADULT support person, who may not rotate, to accompany patient in pre-op.  Children under the age of 62 must have an adult with them who is not the patient and must remain in the main waiting area with an adult.  If the patient needs to stay at the hospital during part of their recovery, the visitor guidelines for inpatient rooms apply.  Please refer to the [facility] website for the visitor guidelines for any additional information.   If you received a COVID test during your pre-op visit  it is requested that you wear a mask when out in public, stay away from anyone that may not be feeling well and notify your surgeon if you develop symptoms. If you have been in contact with anyone that has tested positive in the last 10 days please notify you surgeon.    Before surgery, you can play an important role. Because skin is not sterile, your skin needs to be as free of germs as possible. You can reduce the number of germs on your skin by using the following products.   Benzoyl Peroxide Gel  o Reduces the number of germs present on the skin  o Applied twice a day to shoulder area starting two days before surgery   Chlorhexidine Gluconate (CHG) Soap (instructions listed above  on how to wash with CHG Soap)  o An antiseptic cleaner that kills germs and bonds with the skin to continue killing germs even after washing  o Used for showering the night before surgery and morning of surgery   ==================================================================  Please follow these instructions carefully:  BENZOYL PEROXIDE 5% GEL  Please do not use if you have an allergy to benzoyl peroxide. If your skin becomes reddened/irritated stop using the benzoyl peroxide.  Starting two  days before surgery, apply as follows:  1. Apply benzoyl peroxide in the morning and at night. Apply after taking a shower. If you are not taking a shower clean entire shoulder front, back, and side along with the armpit with a clean wet washcloth.  2. Place a quarter-sized dollop on your SHOULDER and rub in thoroughly, making sure to cover the front, back, and side of your shoulder, along with the armpit.   2 Days prior to Surgery First Dose on _____________ Morning Second Dose on ______________ Night  Day Before Surgery First Dose on ______________ Morning Night before surgery wash (entire body except face and private areas) with CHG Soap THEN Second Dose on ____________ Night   Morning of Surgery  wash BODY AGAIN with CHG Soap   3. Do NOT apply benzoyl peroxide gel on the day of surgery    Pre-operative 5 CHG Bathing Instructions   You can play a key role in reducing the risk of infection after surgery. Your skin needs to be as free of germs as possible. You can reduce the number of germs on your skin by washing with CHG (chlorhexidine gluconate) soap before surgery. CHG is an antiseptic soap that kills germs and continues to kill germs even after washing.   DO NOT use if you have an allergy to chlorhexidine/CHG or antibacterial soaps. If your skin becomes reddened or irritated, stop using the CHG and notify one of our RNs at [phone].   Please shower with the CHG soap starting 4 days before surgery using the following schedule:     Please keep in mind the following:  DO NOT shave, including legs and underarms, starting the day of your first shower.   You may shave your face at any point before/day of surgery.  Place clean sheets on your bed the day you start using CHG soap. Use a clean washcloth (not used since being washed) for each shower. DO NOT sleep with pets once you start using the CHG.   CHG Shower Instructions:  Wash your face and private area with normal  soap. If you choose to wash your hair, wash first with your normal shampoo.  After you use shampoo/soap, rinse your hair and body thoroughly to remove shampoo/soap residue.  Turn the water OFF and apply about 3 tablespoons (45 ml) of CHG soap to a CLEAN washcloth.  Apply CHG soap ONLY FROM YOUR NECK DOWN TO YOUR TOES (washing for 3-5 minutes)  DO NOT use CHG soap on face, private areas, open wounds, or sores.  Pay special attention to the area where your surgery is being performed.  If you are having back surgery, having someone wash your back for you may be helpful. Wait 2 minutes after CHG soap is applied, then you may rinse off the CHG soap.  Pat dry with a clean towel  Put on clean clothes/pajamas   If you choose to wear lotion, please use ONLY the CHG-compatible lotions on the back of this paper.   Additional instructions for the  day of surgery: DO NOT APPLY any lotions, deodorants or perfumes.   Do not bring valuables to the hospital. [facility] is not responsible for any belongings/valuables. Do not wear nail polish, gel polish, artificial nails, or any other type of covering on natural nails (fingers and toes) Do not wear jewelry or makeup Put on clean/comfortable clothes.  Please brush your teeth.  Ask your nurse before applying any prescription medications to the skin.     CHG Compatible Lotions   Aveeno Moisturizing lotion  Cetaphil Moisturizing Cream  Cetaphil Moisturizing Lotion  Clairol Herbal Essence Moisturizing Lotion, Dry Skin  Clairol Herbal Essence Moisturizing Lotion, Extra Dry Skin  Clairol Herbal Essence Moisturizing Lotion, Normal Skin  Curel Age Defying Therapeutic Moisturizing Lotion with Alpha Hydroxy  Curel Extreme Care Body Lotion  Curel Soothing Hands Moisturizing Hand Lotion  Curel Therapeutic Moisturizing Cream, Fragrance-Free  Curel Therapeutic Moisturizing Lotion, Fragrance-Free  Curel Therapeutic Moisturizing Lotion, Original Formula  Eucerin  Daily Replenishing Lotion  Eucerin Dry Skin Therapy Plus Alpha Hydroxy Crme  Eucerin Dry Skin Therapy Plus Alpha Hydroxy Lotion  Eucerin Original Crme  Eucerin Original Lotion  Eucerin Plus Crme Eucerin Plus Lotion  Eucerin TriLipid Replenishing Lotion  Keri Anti-Bacterial Hand Lotion  Keri Deep Conditioning Original Lotion Dry Skin Formula Softly Scented  Keri Deep Conditioning Original Lotion, Fragrance Free Sensitive Skin Formula  Keri Lotion Fast Absorbing Fragrance Free Sensitive Skin Formula  Keri Lotion Fast Absorbing Softly Scented Dry Skin Formula  Keri Original Lotion  Keri Skin Renewal Lotion Keri Silky Smooth Lotion  Keri Silky Smooth Sensitive Skin Lotion  Nivea Body Creamy Conditioning Oil  Nivea Body Extra Enriched Lotion  Nivea Body Original Lotion  Nivea Body Sheer Moisturizing Lotion Nivea Crme  Nivea Skin Firming Lotion  NutraDerm 30 Skin Lotion  NutraDerm Skin Lotion  NutraDerm Therapeutic Skin Cream  NutraDerm Therapeutic Skin Lotion  ProShield Protective Hand Cream  Provon moisturizing lotion  Please read over the following fact sheets that you were given.

## 2023-05-21 NOTE — Progress Notes (Signed)
PCP/Pulmonology - Dr Shan Levans Cardiologist - none Behavioral Health - Dr Kathryne Sharper  Chest x-ray - n/a EKG - 05/22/23 Stress Test - 11/22/16 ECHO - 11/22/16 Cardiac Cath - n/a  Loop -????  Loop Recorder in place.  Sleep Study -  n/a  Diabetes - n/a  ERAS Protcol -Clear liquids til 8 AM DOS.  Please complete your PRE-SURGERY ENSURE that was provided to you by 8 AM, the morning of surgery.  Please, if able, drink it in one setting. DO NOT SIP.  This will be the last thing that you drink.  STOP now taking any Aspirin (unless otherwise instructed by your surgeon), Aleve, Naproxen, Ibuprofen, Motrin, Advil, Goody's, BC's, all herbal medications, fish oil, and all vitamins.   Coronavirus Screening Do you have any of the following symptoms:  Cough yes/no: No Fever (>100.6F)  yes/no: No Runny nose yes/no: No Sore throat yes/no: No Difficulty breathing/shortness of breath  yes/no: No  Have you traveled in the last 14 days and where? yes/no: No  Patient verbalized understanding of instructions that were given to them at the PAT appointment. Patient was also instructed that they will need to review over the PAT instructions again at home before surgery.

## 2023-05-22 ENCOUNTER — Encounter (HOSPITAL_COMMUNITY): Payer: Self-pay

## 2023-05-22 ENCOUNTER — Inpatient Hospital Stay (HOSPITAL_COMMUNITY)
Admission: RE | Admit: 2023-05-22 | Discharge: 2023-05-22 | Disposition: A | Discharge status: Home / Self Care | Payer: Medicaid Other | Source: Ambulatory Visit | Attending: Orthopedic Surgery

## 2023-05-22 ENCOUNTER — Telehealth: Payer: Self-pay

## 2023-05-22 ENCOUNTER — Ambulatory Visit (INDEPENDENT_AMBULATORY_CARE_PROVIDER_SITE_OTHER): Payer: Medicaid Other | Admitting: Orthopedic Surgery

## 2023-05-22 ENCOUNTER — Other Ambulatory Visit: Payer: Self-pay

## 2023-05-22 VITALS — Ht 67.0 in | Wt 187.2 lb

## 2023-05-22 DIAGNOSIS — M7989 Other specified soft tissue disorders: Secondary | ICD-10-CM | POA: Diagnosis not present

## 2023-05-22 DIAGNOSIS — Z01812 Encounter for preprocedural laboratory examination: Secondary | ICD-10-CM | POA: Diagnosis present

## 2023-05-22 DIAGNOSIS — I872 Venous insufficiency (chronic) (peripheral): Secondary | ICD-10-CM

## 2023-05-22 DIAGNOSIS — Z01818 Encounter for other preprocedural examination: Secondary | ICD-10-CM

## 2023-05-22 HISTORY — DX: Dyspnea, unspecified: R06.00

## 2023-05-22 HISTORY — DX: Personal history of other diseases of the digestive system: Z87.19

## 2023-05-22 HISTORY — DX: Herpesviral infection, unspecified: B00.9

## 2023-05-22 HISTORY — DX: Depression, unspecified: F32.A

## 2023-05-22 HISTORY — DX: Pneumonia, unspecified organism: J18.9

## 2023-05-22 HISTORY — DX: Personal history of urinary calculi: Z87.442

## 2023-05-22 LAB — BASIC METABOLIC PANEL
Anion gap: 8 (ref 5–15)
BUN: 9 mg/dL (ref 6–20)
CO2: 25 mmol/L (ref 22–32)
Calcium: 9.1 mg/dL (ref 8.9–10.3)
Chloride: 105 mmol/L (ref 98–111)
Creatinine, Ser: 1.18 mg/dL — ABNORMAL HIGH (ref 0.44–1.00)
GFR, Estimated: 55 mL/min — ABNORMAL LOW (ref >60)
Glucose, Bld: 86 mg/dL (ref 70–99)
Potassium: 4.1 mmol/L (ref 3.5–5.1)
Sodium: 138 mmol/L (ref 135–145)

## 2023-05-22 LAB — CBC
HCT: 41.7 % (ref 36.0–46.0)
Hemoglobin: 13.9 g/dL (ref 12.0–15.0)
MCH: 31.4 pg (ref 26.0–34.0)
MCHC: 33.3 g/dL (ref 30.0–36.0)
MCV: 94.1 fL (ref 80.0–100.0)
Platelets: 396 10*3/uL (ref 150–400)
RBC: 4.43 MIL/uL (ref 3.87–5.11)
RDW: 12.4 % (ref 11.5–15.5)
WBC: 7.3 10*3/uL (ref 4.0–10.5)
nRBC: 0 % (ref 0.0–0.2)

## 2023-05-22 NOTE — Telephone Encounter (Signed)
Pls advise.  

## 2023-05-22 NOTE — Telephone Encounter (Signed)
-----   Message from Cathleen Corti sent at 05/22/2023  2:23 PM EST ----- Regarding: AMBULATORY SURGERY TO OVERNIGHT OBSERVATION Patient is requesting overnight stay due to situation at home. Changed from Ambulatory to OBS.  I checked with Amy B.  She states it makes no difference with the authorization.    Patient stated she was seen by Dr. Lajoyce Corners and her leg looks good and was told she could proceed with surgery. If this change needs to be reversed please let me know. Patient was told this morning her ORDERS were not yet in.  I gave copy of the surgery order to [facility] yesterday, however he could have been waiting for patient to see Dr. Lajoyce Corners this afternoon before entering.    Please advise if she cannot remain an overnight stay.

## 2023-05-22 NOTE — Telephone Encounter (Signed)
Ok for overnight - ctr not approved?

## 2023-05-22 NOTE — Progress Notes (Signed)
Surgical Instructions   Your procedure is scheduled on Tuesday May 29, 2023. Report to [facility] Main Entrance "A" at 9:00 A.M., then check in with the Admitting office. Any questions or running late day of surgery: call [phone]  Questions prior to your surgery date: call [phone], Monday-Friday, 8am-4pm. If you experience any cold or flu symptoms such as cough, fever, chills, shortness of breath, etc. between now and your scheduled surgery, please notify us at the above number.     Remember:  Do not eat after midnight the night before your surgery  You may drink clear liquids until 8:00 the morning of your surgery.   Clear liquids allowed are: Water, Non-Citrus Juices (without pulp), Carbonated Beverages, Clear Tea (no milk, honey, etc.), Black Coffee Only (NO MILK, CREAM OR POWDERED CREAMER of any kind), and Gatorade.    Take these medicines the morning of surgery with A SIP OF WATER  lamoTRIgine (LAMICTAL)  omeprazole (PRILOSEC)  pravastatin (PRAVACHOL)  valACYclovir (VALTREX)    May take these medicines IF NEEDED: albuterol (VENTOLIN HFA) 108 (90 Base) MCG/ACT inhaler. Please bring with you to the hospital.  cyclobenzaprine (FLEXERIL)  oxyCODONE-acetaminophen (PERCOCET)    One week prior to surgery, STOP taking any Aspirin (unless otherwise instructed by your surgeon) Aleve, Naproxen, Ibuprofen, Motrin, Advil, Goody's, BC's, all herbal medications, fish oil, and non-prescription vitamins.  This includes your meloxicam (MOBIC).                       Do NOT Smoke (Tobacco/Vaping) for 24 hours prior to your procedure.  If you use a CPAP at night, you may bring your mask/headgear for your overnight stay.   You will be asked to remove any contacts, glasses, piercing's, hearing aid's, dentures/partials prior to surgery. Please bring cases for these items if needed.    Patients discharged the day of surgery will not be allowed to drive home, and someone needs to  stay with them for 24 hours.  SURGICAL WAITING ROOM VISITATION Patients may have no more than 2 support people in the waiting area - these visitors may rotate.   Pre-op nurse will coordinate an appropriate time for 1 ADULT support person, who may not rotate, to accompany patient in pre-op.  Children under the age of 49 must have an adult with them who is not the patient and must remain in the main waiting area with an adult.  If the patient needs to stay at the hospital during part of their recovery, the visitor guidelines for inpatient rooms apply.  Please refer to the [facility] website for the visitor guidelines for any additional information.   If you received a COVID test during your pre-op visit  it is requested that you wear a mask when out in public, stay away from anyone that may not be feeling well and notify your surgeon if you develop symptoms. If you have been in contact with anyone that has tested positive in the last 10 days please notify you surgeon.       Pre-operative 5 CHG Bathing Instructions   You can play a key role in reducing the risk of infection after surgery. Your skin needs to be as free of germs as possible. You can reduce the number of germs on your skin by washing with CHG (chlorhexidine gluconate) soap before surgery. CHG is an antiseptic soap that kills germs and continues to kill germs even after washing.   DO NOT use if you have  an allergy to chlorhexidine/CHG or antibacterial soaps. If your skin becomes reddened or irritated, stop using the CHG and notify one of our RNs at [phone].   Please shower with the CHG soap starting 4 days before surgery using the following schedule:     Please keep in mind the following:  DO NOT shave, including legs and underarms, starting the day of your first shower.   You may shave your face at any point before/day of surgery.  Place clean sheets on your bed the day you start using CHG soap. Use a clean washcloth  (not used since being washed) for each shower. DO NOT sleep with pets once you start using the CHG.   CHG Shower Instructions:  Wash your face and private area with normal soap. If you choose to wash your hair, wash first with your normal shampoo.  After you use shampoo/soap, rinse your hair and body thoroughly to remove shampoo/soap residue.  Turn the water OFF and apply about 3 tablespoons (45 ml) of CHG soap to a CLEAN washcloth.  Apply CHG soap ONLY FROM YOUR NECK DOWN TO YOUR TOES (washing for 3-5 minutes)  DO NOT use CHG soap on face, private areas, open wounds, or sores.  Pay special attention to the area where your surgery is being performed.  If you are having back surgery, having someone wash your back for you may be helpful. Wait 2 minutes after CHG soap is applied, then you may rinse off the CHG soap.  Pat dry with a clean towel  Put on clean clothes/pajamas   If you choose to wear lotion, please use ONLY the CHG-compatible lotions on the back of this paper.   Additional instructions for the day of surgery: DO NOT APPLY any lotions, deodorants or perfumes.   Do not bring valuables to the hospital. [facility] is not responsible for any belongings/valuables. Do not wear nail polish, gel polish, artificial nails, or any other type of covering on natural nails (fingers and toes) Do not wear jewelry or makeup Put on clean/comfortable clothes.  Please brush your teeth.  Ask your nurse before applying any prescription medications to the skin.     CHG Compatible Lotions   Aveeno Moisturizing lotion  Cetaphil Moisturizing Cream  Cetaphil Moisturizing Lotion  Clairol Herbal Essence Moisturizing Lotion, Dry Skin  Clairol Herbal Essence Moisturizing Lotion, Extra Dry Skin  Clairol Herbal Essence Moisturizing Lotion, Normal Skin  Curel Age Defying Therapeutic Moisturizing Lotion with Alpha Hydroxy  Curel Extreme Care Body Lotion  Curel Soothing Hands Moisturizing Hand Lotion   Curel Therapeutic Moisturizing Cream, Fragrance-Free  Curel Therapeutic Moisturizing Lotion, Fragrance-Free  Curel Therapeutic Moisturizing Lotion, Original Formula  Eucerin Daily Replenishing Lotion  Eucerin Dry Skin Therapy Plus Alpha Hydroxy Crme  Eucerin Dry Skin Therapy Plus Alpha Hydroxy Lotion  Eucerin Original Crme  Eucerin Original Lotion  Eucerin Plus Crme Eucerin Plus Lotion  Eucerin TriLipid Replenishing Lotion  Keri Anti-Bacterial Hand Lotion  Keri Deep Conditioning Original Lotion Dry Skin Formula Softly Scented  Keri Deep Conditioning Original Lotion, Fragrance Free Sensitive Skin Formula  Keri Lotion Fast Absorbing Fragrance Free Sensitive Skin Formula  Keri Lotion Fast Absorbing Softly Scented Dry Skin Formula  Keri Original Lotion  Keri Skin Renewal Lotion Keri Silky Smooth Lotion  Keri Silky Smooth Sensitive Skin Lotion  Nivea Body Creamy Conditioning Oil  Nivea Body Extra Enriched Teacher, adult education Moisturizing Lotion Nivea Crme  Nivea Skin Firming Lotion  NutraDerm 30 Skin Lotion  NutraDerm Skin Lotion  NutraDerm Therapeutic Skin Cream  NutraDerm Therapeutic Skin Lotion  ProShield Protective Hand Cream  Provon moisturizing lotion  Please read over the following fact sheets that you were given.

## 2023-05-28 ENCOUNTER — Other Ambulatory Visit (HOSPITAL_COMMUNITY): Payer: Self-pay

## 2023-05-28 DIAGNOSIS — M75112 Incomplete rotator cuff tear or rupture of left shoulder, not specified as traumatic: Secondary | ICD-10-CM | POA: Diagnosis not present

## 2023-05-28 DIAGNOSIS — Z79891 Long term (current) use of opiate analgesic: Secondary | ICD-10-CM | POA: Diagnosis not present

## 2023-05-28 DIAGNOSIS — M1990 Unspecified osteoarthritis, unspecified site: Secondary | ICD-10-CM | POA: Diagnosis not present

## 2023-05-28 DIAGNOSIS — G894 Chronic pain syndrome: Secondary | ICD-10-CM | POA: Diagnosis not present

## 2023-05-28 MED ORDER — OXYCODONE-ACETAMINOPHEN 10-325 MG PO TABS
1.0000 | ORAL_TABLET | ORAL | 0 refills | Status: DC | PRN
  Filled 2023-05-28: qty 150, 25d supply, fill #0

## 2023-05-29 ENCOUNTER — Observation Stay (HOSPITAL_COMMUNITY)
Admission: RE | Admit: 2023-05-29 | Discharge: 2023-05-29 | Discharge status: Against Medical Advice | Payer: Medicaid Other | Attending: Orthopedic Surgery | Admitting: Orthopedic Surgery

## 2023-05-29 ENCOUNTER — Other Ambulatory Visit: Payer: Self-pay

## 2023-05-29 ENCOUNTER — Encounter (HOSPITAL_COMMUNITY)
Admission: RE | Discharge status: Against Medical Advice | Payer: Self-pay | Source: Home / Self Care | Attending: Orthopedic Surgery

## 2023-05-29 ENCOUNTER — Ambulatory Visit (HOSPITAL_COMMUNITY): Payer: Medicaid Other | Admitting: Physician Assistant

## 2023-05-29 ENCOUNTER — Encounter: Payer: Self-pay | Admitting: Orthopedic Surgery

## 2023-05-29 ENCOUNTER — Encounter (HOSPITAL_COMMUNITY): Payer: Self-pay | Admitting: Orthopedic Surgery

## 2023-05-29 ENCOUNTER — Ambulatory Visit (HOSPITAL_COMMUNITY): Payer: Self-pay | Admitting: Anesthesiology

## 2023-05-29 DIAGNOSIS — J449 Chronic obstructive pulmonary disease, unspecified: Secondary | ICD-10-CM | POA: Insufficient documentation

## 2023-05-29 DIAGNOSIS — Z8541 Personal history of malignant neoplasm of cervix uteri: Secondary | ICD-10-CM | POA: Insufficient documentation

## 2023-05-29 DIAGNOSIS — Z9889 Other specified postprocedural states: Secondary | ICD-10-CM

## 2023-05-29 DIAGNOSIS — G8918 Other acute postprocedural pain: Secondary | ICD-10-CM | POA: Diagnosis not present

## 2023-05-29 DIAGNOSIS — S43432A Superior glenoid labrum lesion of left shoulder, initial encounter: Secondary | ICD-10-CM | POA: Diagnosis not present

## 2023-05-29 DIAGNOSIS — M65912 Unspecified synovitis and tenosynovitis, left shoulder: Secondary | ICD-10-CM | POA: Diagnosis not present

## 2023-05-29 DIAGNOSIS — M7522 Bicipital tendinitis, left shoulder: Secondary | ICD-10-CM | POA: Diagnosis not present

## 2023-05-29 DIAGNOSIS — F17210 Nicotine dependence, cigarettes, uncomplicated: Secondary | ICD-10-CM | POA: Insufficient documentation

## 2023-05-29 DIAGNOSIS — M75122 Complete rotator cuff tear or rupture of left shoulder, not specified as traumatic: Secondary | ICD-10-CM

## 2023-05-29 DIAGNOSIS — Z01818 Encounter for other preprocedural examination: Principal | ICD-10-CM

## 2023-05-29 DIAGNOSIS — S46012A Strain of muscle(s) and tendon(s) of the rotator cuff of left shoulder, initial encounter: Secondary | ICD-10-CM | POA: Diagnosis not present

## 2023-05-29 DIAGNOSIS — Z79899 Other long term (current) drug therapy: Secondary | ICD-10-CM | POA: Insufficient documentation

## 2023-05-29 DIAGNOSIS — F418 Other specified anxiety disorders: Secondary | ICD-10-CM | POA: Diagnosis not present

## 2023-05-29 DIAGNOSIS — M75102 Unspecified rotator cuff tear or rupture of left shoulder, not specified as traumatic: Secondary | ICD-10-CM | POA: Diagnosis not present

## 2023-05-29 DIAGNOSIS — E782 Mixed hyperlipidemia: Secondary | ICD-10-CM | POA: Diagnosis not present

## 2023-05-29 DIAGNOSIS — G5602 Carpal tunnel syndrome, left upper limb: Secondary | ICD-10-CM | POA: Diagnosis not present

## 2023-05-29 HISTORY — PX: CARPAL TUNNEL RELEASE: SHX101

## 2023-05-29 HISTORY — PX: SHOULDER ARTHROSCOPY WITH SUBACROMIAL DECOMPRESSION, ROTATOR CUFF REPAIR AND BICEP TENDON REPAIR: SHX5687

## 2023-05-29 SURGERY — SHOULDER ARTHROSCOPY WITH SUBACROMIAL DECOMPRESSION, ROTATOR CUFF REPAIR AND BICEP TENDON REPAIR
Anesthesia: Regional | Site: Shoulder | Laterality: Left

## 2023-05-29 MED ORDER — LIDOCAINE 2% (20 MG/ML) 5 ML SYRINGE
INTRAMUSCULAR | Status: DC | PRN
  Administered 2023-05-29: 80 mg via INTRAVENOUS

## 2023-05-29 MED ORDER — CHLORHEXIDINE GLUCONATE 0.12 % MT SOLN
OROMUCOSAL | Status: AC
  Administered 2023-05-29: 15 mL
  Filled 2023-05-29: qty 15

## 2023-05-29 MED ORDER — EPINEPHRINE PF 1 MG/ML IJ SOLN
INTRAMUSCULAR | Status: DC | PRN
  Administered 2023-05-29: 2 mg via INTRAMUSCULAR

## 2023-05-29 MED ORDER — LACTATED RINGERS IV SOLN: INTRAVENOUS | Status: DC | PRN

## 2023-05-29 MED ORDER — DOCUSATE SODIUM 100 MG PO CAPS: 100.0000 mg | ORAL_CAPSULE | Freq: Two times a day (BID) | ORAL | Status: DC

## 2023-05-29 MED ORDER — CEFAZOLIN SODIUM-DEXTROSE 2-4 GM/100ML-% IV SOLN
2.0000 g | INTRAVENOUS | Status: AC
  Administered 2023-05-29: 2 g via INTRAVENOUS

## 2023-05-29 MED ORDER — OXYCODONE HCL 5 MG PO TABS
10.0000 mg | ORAL_TABLET | ORAL | Status: DC | PRN
  Administered 2023-05-29: 15 mg via ORAL
  Filled 2023-05-29: qty 3

## 2023-05-29 MED ORDER — METOCLOPRAMIDE HCL 5 MG PO TABS: 5.0000 mg | ORAL_TABLET | Freq: Three times a day (TID) | ORAL | Status: DC | PRN

## 2023-05-29 MED ORDER — ALBUTEROL SULFATE (2.5 MG/3ML) 0.083% IN NEBU: 3.0000 mL | INHALATION_SOLUTION | Freq: Four times a day (QID) | RESPIRATORY_TRACT | Status: DC | PRN

## 2023-05-29 MED ORDER — MIDAZOLAM HCL 2 MG/2ML IJ SOLN: 2.0000 mg | Freq: Once | INTRAMUSCULAR | Status: AC

## 2023-05-29 MED ORDER — PROPOFOL 10 MG/ML IV BOLUS
INTRAVENOUS | Status: DC | PRN
  Administered 2023-05-29: 110 mg via INTRAVENOUS

## 2023-05-29 MED ORDER — CEFAZOLIN SODIUM-DEXTROSE 2-4 GM/100ML-% IV SOLN
INTRAVENOUS | Status: AC
  Filled 2023-05-29: qty 100

## 2023-05-29 MED ORDER — METHOCARBAMOL 500 MG PO TABS
500.0000 mg | ORAL_TABLET | Freq: Four times a day (QID) | ORAL | Status: DC | PRN
  Administered 2023-05-29: 500 mg via ORAL
  Filled 2023-05-29: qty 1

## 2023-05-29 MED ORDER — MIDAZOLAM HCL 2 MG/2ML IJ SOLN
INTRAMUSCULAR | Status: AC
  Administered 2023-05-29: 2 mg via INTRAVENOUS
  Filled 2023-05-29: qty 2

## 2023-05-29 MED ORDER — ONDANSETRON HCL 4 MG/2ML IJ SOLN: 4.0000 mg | Freq: Four times a day (QID) | INTRAMUSCULAR | Status: DC | PRN

## 2023-05-29 MED ORDER — ROCURONIUM BROMIDE 10 MG/ML (PF) SYRINGE
PREFILLED_SYRINGE | INTRAVENOUS | Status: AC
  Filled 2023-05-29: qty 10

## 2023-05-29 MED ORDER — METHOCARBAMOL 1000 MG/10ML IJ SOLN: 500.0000 mg | Freq: Four times a day (QID) | INTRAMUSCULAR | Status: DC | PRN

## 2023-05-29 MED ORDER — ONDANSETRON HCL 4 MG/2ML IJ SOLN
INTRAMUSCULAR | Status: AC
  Filled 2023-05-29: qty 2

## 2023-05-29 MED ORDER — DEXAMETHASONE SODIUM PHOSPHATE 10 MG/ML IJ SOLN
INTRAMUSCULAR | Status: AC
  Filled 2023-05-29: qty 1

## 2023-05-29 MED ORDER — ACETAMINOPHEN 500 MG PO TABS
ORAL_TABLET | ORAL | Status: AC
  Administered 2023-05-29: 1000 mg via ORAL
  Filled 2023-05-29: qty 2

## 2023-05-29 MED ORDER — MIDAZOLAM HCL 2 MG/2ML IJ SOLN
INTRAMUSCULAR | Status: AC
  Filled 2023-05-29: qty 2

## 2023-05-29 MED ORDER — FENTANYL CITRATE (PF) 100 MCG/2ML IJ SOLN: 50.0000 ug | Freq: Once | INTRAMUSCULAR | Status: AC

## 2023-05-29 MED ORDER — DEXAMETHASONE SODIUM PHOSPHATE 10 MG/ML IJ SOLN
INTRAMUSCULAR | Status: DC | PRN
  Administered 2023-05-29: 10 mg via INTRAVENOUS

## 2023-05-29 MED ORDER — HYDROMORPHONE HCL 1 MG/ML IJ SOLN: 1.0000 mg | INTRAMUSCULAR | Status: DC | PRN

## 2023-05-29 MED ORDER — PHENOL 1.4 % MT LIQD: 1.0000 | OROMUCOSAL | Status: DC | PRN

## 2023-05-29 MED ORDER — CELECOXIB 100 MG PO CAPS: 100.0000 mg | ORAL_CAPSULE | Freq: Two times a day (BID) | ORAL | Status: DC

## 2023-05-29 MED ORDER — DROPERIDOL 2.5 MG/ML IJ SOLN: 0.6250 mg | Freq: Once | INTRAMUSCULAR | Status: DC | PRN

## 2023-05-29 MED ORDER — CEFAZOLIN SODIUM-DEXTROSE 2-4 GM/100ML-% IV SOLN: 2.0000 g | Freq: Three times a day (TID) | INTRAVENOUS | Status: DC

## 2023-05-29 MED ORDER — ASPIRIN 81 MG PO TBEC: 81.0000 mg | DELAYED_RELEASE_TABLET | Freq: Every day | ORAL | Status: DC

## 2023-05-29 MED ORDER — EPINEPHRINE PF 1 MG/ML IJ SOLN
INTRAMUSCULAR | Status: AC
  Filled 2023-05-29: qty 1

## 2023-05-29 MED ORDER — SUGAMMADEX SODIUM 200 MG/2ML IV SOLN
INTRAVENOUS | Status: DC | PRN
  Administered 2023-05-29: 200 mg via INTRAVENOUS

## 2023-05-29 MED ORDER — TRANEXAMIC ACID-NACL 1000-0.7 MG/100ML-% IV SOLN
INTRAVENOUS | Status: AC
  Filled 2023-05-29: qty 100

## 2023-05-29 MED ORDER — ONDANSETRON HCL 4 MG PO TABS: 4.0000 mg | ORAL_TABLET | Freq: Four times a day (QID) | ORAL | Status: DC | PRN

## 2023-05-29 MED ORDER — ACETAMINOPHEN 500 MG PO TABS
1000.0000 mg | ORAL_TABLET | Freq: Four times a day (QID) | ORAL | Status: DC
  Administered 2023-05-29: 1000 mg via ORAL
  Filled 2023-05-29: qty 2

## 2023-05-29 MED ORDER — ONDANSETRON HCL 4 MG/2ML IJ SOLN
INTRAMUSCULAR | Status: DC | PRN
  Administered 2023-05-29: 4 mg via INTRAVENOUS

## 2023-05-29 MED ORDER — NALOXONE HCL 0.4 MG/ML IJ SOLN: 0.4000 mg | Freq: Once | INTRAMUSCULAR | Status: DC

## 2023-05-29 MED ORDER — MENTHOL 3 MG MT LOZG: 1.0000 | LOZENGE | OROMUCOSAL | Status: DC | PRN

## 2023-05-29 MED ORDER — PROPOFOL 10 MG/ML IV BOLUS
INTRAVENOUS | Status: AC
  Filled 2023-05-29: qty 20

## 2023-05-29 MED ORDER — HYDROMORPHONE HCL 1 MG/ML IJ SOLN
0.2500 mg | INTRAMUSCULAR | Status: DC | PRN
Start: 2023-05-29 — End: 2023-05-29

## 2023-05-29 MED ORDER — FENTANYL CITRATE (PF) 100 MCG/2ML IJ SOLN
INTRAMUSCULAR | Status: AC
  Administered 2023-05-29: 50 ug via INTRAVENOUS
  Filled 2023-05-29: qty 2

## 2023-05-29 MED ORDER — ACETAMINOPHEN 500 MG PO TABS: 1000.0000 mg | ORAL_TABLET | Freq: Once | ORAL | Status: AC

## 2023-05-29 MED ORDER — 0.9 % SODIUM CHLORIDE (POUR BTL) OPTIME
TOPICAL | Status: DC | PRN
Start: 2023-05-29 — End: 2023-05-29
  Administered 2023-05-29: 1000 mL

## 2023-05-29 MED ORDER — ACETAMINOPHEN 325 MG PO TABS: 325.0000 mg | ORAL_TABLET | Freq: Four times a day (QID) | ORAL | Status: DC | PRN

## 2023-05-29 MED ORDER — BUPIVACAINE HCL (PF) 0.5 % IJ SOLN
INTRAMUSCULAR | Status: DC | PRN
  Administered 2023-05-29: 10 mL via PERINEURAL

## 2023-05-29 MED ORDER — SODIUM CHLORIDE 0.9 % IR SOLN
Status: DC | PRN
  Administered 2023-05-29: 3000 mL

## 2023-05-29 MED ORDER — PHENYLEPHRINE HCL-NACL 20-0.9 MG/250ML-% IV SOLN
INTRAVENOUS | Status: DC | PRN
  Administered 2023-05-29: 30 ug/min via INTRAVENOUS

## 2023-05-29 MED ORDER — BUPIVACAINE LIPOSOME 1.3 % IJ SUSP
INTRAMUSCULAR | Status: DC | PRN
  Administered 2023-05-29: 10 mL via PERINEURAL

## 2023-05-29 MED ORDER — LAMOTRIGINE 150 MG PO TABS: 300.0000 mg | ORAL_TABLET | Freq: Every day | ORAL | Status: DC

## 2023-05-29 MED ORDER — HYDROMORPHONE HCL 1 MG/ML IJ SOLN: 0.5000 mg | INTRAMUSCULAR | Status: DC | PRN

## 2023-05-29 MED ORDER — TRANEXAMIC ACID-NACL 1000-0.7 MG/100ML-% IV SOLN
1000.0000 mg | INTRAVENOUS | Status: AC
  Administered 2023-05-29: 1000 mg via INTRAVENOUS

## 2023-05-29 MED ORDER — FENTANYL CITRATE (PF) 250 MCG/5ML IJ SOLN
INTRAMUSCULAR | Status: AC
  Filled 2023-05-29: qty 5

## 2023-05-29 MED ORDER — METOCLOPRAMIDE HCL 5 MG/ML IJ SOLN: 5.0000 mg | Freq: Three times a day (TID) | INTRAMUSCULAR | Status: DC | PRN

## 2023-05-29 MED ORDER — GABAPENTIN 100 MG PO CAPS: 200.0000 mg | ORAL_CAPSULE | Freq: Three times a day (TID) | ORAL | Status: DC

## 2023-05-29 MED ORDER — FENTANYL CITRATE (PF) 250 MCG/5ML IJ SOLN
INTRAMUSCULAR | Status: DC | PRN
  Administered 2023-05-29 (×2): 50 ug via INTRAVENOUS

## 2023-05-29 MED ORDER — ROCURONIUM BROMIDE 10 MG/ML (PF) SYRINGE
PREFILLED_SYRINGE | INTRAVENOUS | Status: DC | PRN
  Administered 2023-05-29: 20 mg via INTRAVENOUS
  Administered 2023-05-29: 50 mg via INTRAVENOUS
  Administered 2023-05-29: 30 mg via INTRAVENOUS

## 2023-05-29 SURGICAL SUPPLY — 84 items
ANCHOR FBRTK 2.6 SUTURETAP 1.3 (Anchor) IMPLANT
ANCHOR SUT 1.8 FIBERTAK SB KL (Anchor) IMPLANT
ANCHOR SUT BIOCOMP CORKSREW (Anchor) IMPLANT
ANCHOR SWIVELOCK BIO 4.75X19.1 (Anchor) IMPLANT
BAG COUNTER SPONGE SURGICOUNT (BAG) ×2 IMPLANT
BAND RUBBER #18 3X1/16 STRL (MISCELLANEOUS) IMPLANT
BLADE EXCALIBUR 4.0X13 (MISCELLANEOUS) IMPLANT
BLADE SHAVER TORPEDO 4X13 (MISCELLANEOUS) IMPLANT
BNDG ELASTIC 3INX 5YD STR LF (GAUZE/BANDAGES/DRESSINGS) ×2 IMPLANT
BNDG ESMARK 4X9 LF (GAUZE/BANDAGES/DRESSINGS) IMPLANT
BURR OVAL 8 FLU 4.0X13 (MISCELLANEOUS) IMPLANT
CORD BIPOLAR FORCEPS 12FT (ELECTRODE) ×2 IMPLANT
COVER SURGICAL LIGHT HANDLE (MISCELLANEOUS) ×2 IMPLANT
CUFF TOURN SGL QUICK 18X4 (TOURNIQUET CUFF) IMPLANT
CUFF TOURN SGL QUICK 24 (TOURNIQUET CUFF)
CUFF TRNQT CYL 24X4X16.5-23 (TOURNIQUET CUFF) IMPLANT
DRAPE INCISE IOBAN 66X45 STRL (DRAPES) ×4 IMPLANT
DRAPE STERI 35X30 U-POUCH (DRAPES) ×2 IMPLANT
DRAPE SURG 17X23 STRL (DRAPES) ×2 IMPLANT
DRAPE U-SHAPE 47X51 STRL (DRAPES) ×4 IMPLANT
DRSG TEGADERM 2-3/8X2-3/4 SM (GAUZE/BANDAGES/DRESSINGS) IMPLANT
DRSG TEGADERM 4X4.75 (GAUZE/BANDAGES/DRESSINGS) ×6 IMPLANT
DURAPREP 26ML APPLICATOR (WOUND CARE) ×2 IMPLANT
DW OUTFLOW CASSETTE/TUBE SET (MISCELLANEOUS) ×2 IMPLANT
ELECT REM PT RETURN 9FT ADLT (ELECTROSURGICAL) ×2
ELECTRODE REM PT RTRN 9FT ADLT (ELECTROSURGICAL) ×2 IMPLANT
GAUZE SPONGE 4X4 12PLY STRL (GAUZE/BANDAGES/DRESSINGS) ×2 IMPLANT
GAUZE SPONGE 4X4 12PLY STRL LF (GAUZE/BANDAGES/DRESSINGS) ×2 IMPLANT
GAUZE XEROFORM 1X8 LF (GAUZE/BANDAGES/DRESSINGS) ×2 IMPLANT
GLOVE BIO SURGEON STRL SZ 6.5 (GLOVE) ×2 IMPLANT
GLOVE BIOGEL PI IND STRL 6.5 (GLOVE) ×2 IMPLANT
GLOVE BIOGEL PI IND STRL 7.0 (GLOVE) ×2 IMPLANT
GLOVE BIOGEL PI IND STRL 8 (GLOVE) ×2 IMPLANT
GLOVE ECLIPSE 7.0 STRL STRAW (GLOVE) ×2 IMPLANT
GLOVE ECLIPSE 8.0 STRL XLNG CF (GLOVE) ×2 IMPLANT
GOWN STRL REUS W/ TWL LRG LVL3 (GOWN DISPOSABLE) ×6 IMPLANT
GOWN STRL REUS W/TWL LRG LVL3 (GOWN DISPOSABLE) ×6
KIT BASIN OR (CUSTOM PROCEDURE TRAY) ×2 IMPLANT
KIT STR SPEAR 1.8 FBRTK DISP (KITS) IMPLANT
KIT TURNOVER KIT B (KITS) ×2 IMPLANT
LOOP VASCLR MAXI BLUE 18IN ST (MISCELLANEOUS) IMPLANT
LOOPS VASCLR MAXI BLUE 18IN ST (MISCELLANEOUS) IMPLANT
MANIFOLD NEPTUNE II (INSTRUMENTS) ×2 IMPLANT
NDL HYPO 25GX1X1/2 BEV (NEEDLE) ×2 IMPLANT
NDL SCORPION MULTI FIRE (NEEDLE) IMPLANT
NDL SPNL 18GX3.5 QUINCKE PK (NEEDLE) ×2 IMPLANT
NDL SUT 6 .5 CRC .975X.05 MAYO (NEEDLE) IMPLANT
NEEDLE HYPO 25GX1X1/2 BEV (NEEDLE) ×2 IMPLANT
NEEDLE SCORPION MULTI FIRE (NEEDLE) IMPLANT
NEEDLE SPNL 18GX3.5 QUINCKE PK (NEEDLE) ×2 IMPLANT
NS IRRIG 1000ML POUR BTL (IV SOLUTION) ×2 IMPLANT
PACK ORTHO EXTREMITY (CUSTOM PROCEDURE TRAY) ×2 IMPLANT
PACK SHOULDER (CUSTOM PROCEDURE TRAY) ×2 IMPLANT
PAD ARMBOARD 7.5X6 YLW CONV (MISCELLANEOUS) ×4 IMPLANT
PROBE APOLLO 90XL (SURGICAL WAND) ×2 IMPLANT
PUSHLOCK PEEK 4.5X24 (Orthopedic Implant) IMPLANT
RESTRAINT HEAD UNIVERSAL NS (MISCELLANEOUS) ×2 IMPLANT
SLING ARM IMMOBILIZER LRG (SOFTGOODS) IMPLANT
SPONGE T-LAP 4X18 ~~LOC~~+RFID (SPONGE) ×4 IMPLANT
STRIP CLOSURE SKIN 1/2X4 (GAUZE/BANDAGES/DRESSINGS) ×2 IMPLANT
SUCTION TUBE FRAZIER 10FR DISP (SUCTIONS) ×2 IMPLANT
SUT ETHILON 3 0 PS 1 (SUTURE) ×2 IMPLANT
SUT FIBERWIRE #2 38 T-5 BLUE (SUTURE)
SUT MNCRL AB 3-0 PS2 18 (SUTURE) ×2 IMPLANT
SUT VIC AB 0 CT1 27XBRD ANBCTR (SUTURE) ×2 IMPLANT
SUT VIC AB 1 CT1 27XBRD ANBCTR (SUTURE) IMPLANT
SUT VIC AB 2-0 CT1 TAPERPNT 27 (SUTURE) ×2 IMPLANT
SUT VIC AB CT1 27XBRD ANBCTRL (SUTURE) ×4
SUT VICRYL 0 UR6 27IN ABS (SUTURE) ×2 IMPLANT
SUT VICRYL 1 TIES 12X18 (SUTURE) ×2 IMPLANT
SUTURE FIBERWR #2 38 T-5 BLUE (SUTURE) IMPLANT
SUTURE TAPE 1.3 FIBERLOP 20 ST (SUTURE) IMPLANT
SUTURETAPE 1.3 FIBERLOOP 20 ST (SUTURE)
SYR CONTROL 10ML LL (SYRINGE) ×2 IMPLANT
SYS FBRTK BUTTON 2.6 (Anchor) ×2 IMPLANT
SYSTEM FBRTK BUTTON 2.6 (Anchor) IMPLANT
TOWEL GREEN STERILE (TOWEL DISPOSABLE) ×2 IMPLANT
TOWEL GREEN STERILE FF (TOWEL DISPOSABLE) ×2 IMPLANT
TUBE CONNECTING 12X1/4 (SUCTIONS) IMPLANT
TUBING ARTHROSCOPY IRRIG 16FT (MISCELLANEOUS) ×2 IMPLANT
UNDERPAD 30X36 HEAVY ABSORB (UNDERPADS AND DIAPERS) ×2 IMPLANT
VASCULAR TIE MAXI BLUE 18IN ST (MISCELLANEOUS)
WAND ABLATOR APOLLO I90 (BUR) IMPLANT
WATER STERILE IRR 1000ML POUR (IV SOLUTION) ×2 IMPLANT

## 2023-05-29 NOTE — Progress Notes (Addendum)
Pt requested to leave AMA stated she is not satisfied with pain medications she has been getting here. Pt stated " I can just go home and take 4 of oxy 10mg  to get over with my pain." This RN discussed with the pt about what we can do to help with her pain. On call provider notified. Dr. Lajoyce Corners gave a verbal order to increase dilaudid to 1 mg every 2 hours. Order placed. I went to discuss with the pt about pain medications. Pt verbalized understanding, but still wanted to leave AMA, and said her friend is on the way here to pick her up. I went over the risks of leaving AMA with the pt. Pt verbalized understanding and signed AMA form.

## 2023-05-29 NOTE — Op Note (Signed)
NAME: Katie Sherman, [place] MEDICAL RECORD NO: [id number] ACCOUNT NO: [id number] DATE OF BIRTH: 05-29-1966 FACILITY: [facility] LOCATION: [facility] PHYSICIAN: Graylin Shiver. August Saucer, MD  Operative Report   DATE OF PROCEDURE: 05/29/2023  PREOPERATIVE DIAGNOSES: 1.  Left shoulder rotator cuff tear. 2.  Biceps tendinitis. 3.  Left carpal tunnel syndrome.  POSTOPERATIVE DIAGNOSES: 1.  Left shoulder rotator cuff tear. 2.  Biceps tendinitis. 3.  Humeral head chondromalacia, grade 4 over nickel-sized area. 4.  Carpal tunnel syndrome.  PROCEDURE:  Left shoulder arthroscopy and debridement of the humeral head chondral flaps, superior labrum, and rotator cuff tear with release of biceps tendon followed by microfracture of the humeral head and subsequent mini-open biceps tenodesis and  rotator cuff tear repair of a 1.5 x 1.5 cm tear and carpal tunnel release.  SURGEON:  Graylin Shiver. August Saucer, MD  ASSISTANT:  Karenann Cai, PA  INDICATIONS:  This is a 57 year old patient with significant left shoulder pain who presents for operative management after explanation of risks and benefits.  DESCRIPTION OF PROCEDURE:  The patient was brought to the operating room where general endotracheal anesthesia was induced.  Preoperative antibiotics were administered.  Timeout was called.  The patient was placed in the beach chair position with the  head in neutral position.  Left shoulder, arm and hand prescrubbed with alcohol and Betadine allowed to air dry prepped with DuraPrep solution and draped in a sterile manner.  Ioban used to seal the operative field around the shoulder.  After calling  timeout, posterior portal was created 2 cm medial and inferior to the posterolateral margin of the acromion.  Diagnostic arthroscopy was performed.  Anterior portal was created under direct visualization.  The patient did have an area of grade 4  chondromalacia with loose chondral flaps on the humeral head.  This was debrided with a shaver.   Rotator cuff tear of the supraspinatus was also noted, which was near full-thickness at 1.5 x 1.5 cm.  Biceps tendon had significant tendinitis as well as  degenerative SLAP tear.  Biceps tendon was released using Arthrocare wand and the superior labrum was debrided.  Rotator cuff tear was also debrided with a shaver.  A smooth shaver also was used to debride the chondral flaps on the humeral head.   Anterior inferior, and posterior inferior glenohumeral ligaments were intact.  Following extensive debridement, the joint was irrigated and the instruments were removed.  Intraarticular subscapularis and infraspinatus were intact.  Next, Ioban used to  cover the entire operative field.  An incision was made off the anterior lateral margin of the acromion.  Skin and subcutaneous tissue were sharply divided.  The raphe between the anterior and middle deltoids was then developed and exposed.  Marked with  a #1 Vicryl suture 4 cm from the anterolateral margin of the acromion.  The deltoid was then split and bursectomy performed.  Acromioplasty was also performed.  Self-retaining retractor placed and the transverse humeral ligament was opened and the biceps  tendon was tenodesed after placing FiberLoop suture through the tendon.  The tendon was tenodesed into the bicipital groove using two knotless suture anchors in the proximal and distal aspect of the groove.  It should be noted that the floor of the  groove was prepared with a rasp.  At this time, after biceps tenodesis was performed, this was oversewn using 2-0 Vicryl sutures.  Attention was directed towards the rotator cuff tear.  Thin flap was present and devitalized appearing rotator cuff tear  was debrided  sharply.  Four 0 Vicryl sutures were placed in the leading edge of the tendon and two medial row knotless suture tacks were placed.  The Scorpion suture passer was used to pass these tapes x8 from the posterior to anterior aspect of the  rotator cuff  involving primarily the supraspinatus.  Next, with the rotator cuff tendon reduced to its footprint, which was prepared using rasp and a #15 blade, the suture tapes were tied and crossed.  They were matched with the anterior and posterior 2  Vicryls and then placed in the SwiveLock with the arm in abduction.  Bone quality was marginal.  Nonetheless, a watertight repair was achieved.  Thorough irrigation was performed.  Deltoid split was then closed using #1 Vicryl suture followed by  interrupted inverted 0 Vicryl suture, 2-0 Vicryl suture and 3-0 Monocryl with Steri-Strips, and impervious dressings applied.  The attention was then directed towards the hand.  The hand was re-prepped with DuraPrep solution.  The hand was elevated and  exsanguinated with Esmarch wrap.  Tourniquet was inflated.  Total tourniquet time was 14 minutes.  Incision made at the intersection of Kaplan's cardinal line on the radial border of the fourth finger.  Incision taken down to the distal wrist flexion  crease.  Skin and subcutaneous tissue were sharply divided.  Bleeding points encountered closed using electrocautery.  Transverse carpal ligament was identified.  Transverse carpal ligament was then incised in its mid portion longitudinally 2-3 mm and a  right angle retractor was then placed between the median nerve and the transverse carpal ligament, which was lifted off the nerve and released proximally to the forearm fascia under direct visualization and distally along its length under direct  visualization.  Motor branch intact and there were no masses in the carpal canal.  Thorough irrigation was performed.  Tourniquet was released and bleeding points encountered controlled using bipolar electrocautery.  Skin closed using simple 3-0 nylon  suture followed by impervious dressing.  The patient tolerated the procedure well without immediate complications and was transferred to the recovery room in stable condition.  Luke's  assistance was required at all times for retraction, opening and  closing and mobilization of tissue.  His assistance was a medical necessity.   PUS D: 05/29/2023 3:10:16 pm T: 05/29/2023 3:40:00 pm  JOB: [id number]/ [id number]

## 2023-05-29 NOTE — Plan of Care (Signed)
  Problem: Education: Goal: Knowledge of General Education information will improve Description: Including pain rating scale, medication(s)/side effects and non-pharmacologic comfort measures Outcome: Progressing   Problem: Health Behavior/Discharge Planning: Goal: Ability to manage health-related needs will improve Outcome: Progressing   Problem: Clinical Measurements: Goal: Ability to maintain clinical measurements within normal limits will improve Outcome: Progressing Goal: Will remain free from infection Outcome: Progressing Goal: Diagnostic test results will improve Outcome: Progressing Goal: Respiratory complications will improve Outcome: Progressing Goal: Cardiovascular complication will be avoided Outcome: Progressing   Problem: Activity: Goal: Risk for activity intolerance will decrease Outcome: Progressing   Problem: Nutrition: Goal: Adequate nutrition will be maintained Outcome: Progressing   Problem: Coping: Goal: Level of anxiety will decrease Outcome: Progressing   Problem: Elimination: Goal: Will not experience complications related to bowel motility Outcome: Progressing Goal: Will not experience complications related to urinary retention Outcome: Progressing   Problem: Pain Management: Goal: General experience of comfort will improve Outcome: Progressing   Problem: Safety: Goal: Ability to remain free from injury will improve Outcome: Progressing   Problem: Skin Integrity: Goal: Risk for impaired skin integrity will decrease Outcome: Progressing   Problem: Education: Goal: Knowledge of the prescribed therapeutic regimen will improve Outcome: Progressing Goal: Understanding of activity limitations/precautions following surgery will improve Outcome: Progressing Goal: Individualized Educational Video(s) Outcome: Progressing   Problem: Activity: Goal: Ability to tolerate increased activity will improve Outcome: Progressing   Problem: Pain  Management: Goal: Pain level will decrease with appropriate interventions Outcome: Progressing

## 2023-05-29 NOTE — Anesthesia Postprocedure Evaluation (Signed)
Anesthesia Post Note  Patient: Katie Sherman  Procedure(s) Performed: LEFT SHOULDER ARTHROSCOPY, DEBRIDEMENT, MINI OPEN BICEPS TENODESIS AND ROTATOR CUFF TEAR REPAIR (Left: Shoulder) LEFT CARPAL TUNNEL RELEASE (Left)     Patient location during evaluation: PACU Anesthesia Type: General Level of consciousness: awake and alert Pain management: pain level controlled Vital Signs Assessment: post-procedure vital signs reviewed and stable Respiratory status: spontaneous breathing, nonlabored ventilation, respiratory function stable and patient connected to nasal cannula oxygen Cardiovascular status: blood pressure returned to baseline and stable Postop Assessment: no apparent nausea or vomiting Anesthetic complications: no  No notable events documented.  Last Vitals:  Vitals:   05/29/23 1545 05/29/23 1600  BP: (!) 142/78 134/79  Pulse: 73 72  Resp: 18 19  Temp:    SpO2: 97% 93%    Last Pain:  Vitals:   05/29/23 1600  TempSrc:   PainSc: 0-No pain                 Mickell Birdwell S

## 2023-05-29 NOTE — Brief Op Note (Signed)
   05/29/2023  3:02 PM  PATIENT:  Waynette Buttery  57 y.o. female  PRE-OPERATIVE DIAGNOSIS:  left shoulder rotator cuff tear, biceps tendinitis, left carpal tunnel syndrome  POST-OPERATIVE DIAGNOSIS:  left shoulder rotator cuff tear and oa humeral head, biceps tendinitis, left carpal tunnel syndrome  PROCEDURE:  Procedure(s): LEFT SHOULDER ARTHROSCOPY, DEBRIDEMENT humeral head microfracture,, MINI OPEN BICEPS TENODESIS AND ROTATOR CUFF TEAR REPAIR LEFT CARPAL TUNNEL RELEASE  SURGEON:  Surgeon(s): August Saucer, Corrie Mckusick, MD  ASSISTANT: magnant pa  ANESTHESIA:   general  EBL: 25 ml    Total I/O In: -  Out: 40 [Blood:40]  BLOOD ADMINISTERED: none  DRAINS: none   LOCAL MEDICATIONS USED:  none  SPECIMEN:  No Specimen  COUNTS:  YES  TOURNIQUET:   Total Tourniquet Time Documented: area (laterality) - 14 minutes Total: area (laterality) - 14 minutes   DICTATION: .Other Dictation: Dictation Number [id number]  PLAN OF CARE: Admit for overnight observation  PATIENT DISPOSITION:  PACU - hemodynamically stable

## 2023-05-29 NOTE — Progress Notes (Signed)
Office Visit Note   Patient: Katie Sherman           Date of Birth: [birth year]           MRN: [id number] Visit Date: 05/22/2023              Requested by: Storm Frisk, MD [address] [place],  [place] [address] PCP: Storm Frisk, MD  Chief Complaint  Patient presents with   Right Leg - Follow-up      HPI: Patient is a 57 year old woman who is seen for evaluation of redness and swelling right lower extremity.  Patient states the swelling is improving.  Patient has been on a course of Keflex.  Patient is currently scheduled for shoulder surgery and carpal tunnel release November 19.  Assessment & Plan: Visit Diagnoses:  1. Right leg swelling   2. Venous stasis dermatitis of both lower extremities     Plan: Feel patient is safe to proceed with shoulder surgery.  Recommended she wear a extra-large knee-high compression sock.  Follow-Up Instructions: Return in about 4 weeks (around 06/19/2023).   Ortho Exam  Patient is alert, oriented, no adenopathy, well-dressed, normal affect, normal respiratory effort. Examination of the right lower extremity her calf is 40 cm in circumference there is no induration.  The cellulitis is resolving there is no drainage no tenderness to palpation no signs of infection.  Imaging: No results found. No images are attached to the encounter.  Labs: Lab Results  Component Value Date   HGBA1C 5.8 (H) 08/29/2022   HGBA1C 5.7 (H) 02/07/2022   HGBA1C 5.3 01/01/2020   ESRSEDRATE 12 09/05/2021   CRP 5 09/05/2021     Lab Results  Component Value Date   ALBUMIN 4.6 08/29/2022   ALBUMIN 4.5 02/07/2022   ALBUMIN 4.4 09/05/2021    No results found for: "MG" Lab Results  Component Value Date   VD25OH 33.7 02/18/2019   VD25OH 28.8 (L) 01/24/2017    No results found for: "PREALBUMIN"    Latest Ref Rng & Units 05/22/2023   12:09 PM 02/01/2023    4:00 PM 08/29/2022    2:02 PM  CBC EXTENDED  WBC 4.0 - 10.5 K/uL 7.3   7.0  7.4   RBC 3.87 - 5.11 MIL/uL 4.43  3.94  4.34   Hemoglobin 12.0 - 15.0 g/dL 19.1  47.8  29.5   HCT 36.0 - 46.0 % 41.7  36.9  41.4   Platelets 150 - 400 K/uL 396  347  382   NEUT# 1.4 - 7.0 x10E3/uL   3.0   Lymph# 0.7 - 3.1 x10E3/uL   3.7      There is no height or weight on file to calculate BMI.  Orders:  No orders of the defined types were placed in this encounter.  No orders of the defined types were placed in this encounter.    Procedures: No procedures performed  Clinical Data: No additional findings.  ROS:  All other systems negative, except as noted in the HPI. Review of Systems  Objective: Vital Signs: LMP 04/29/2012   Specialty Comments:  No specialty comments available.  PMFS History: Patient Active Problem List   Diagnosis Date Noted   Homelessness 02/07/2022   Chronic pain syndrome 02/07/2022   Costochondritis 09/05/2021   Tobacco dependence 08/07/2020   Lung nodules 02/16/2020   Vitamin D deficiency 02/19/2019   Bilateral carpal tunnel syndrome 03/19/2018   Mixed hyperlipidemia 11/14/2017   Insomnia  due to other mental disorder 05/23/2017   Postmenopausal 01/22/2017   Bipolar 1 disorder, depressed (HCC) 12/29/2016   Family history of suicide 11/20/2016   Depression 11/20/2016   Family history of breast cancer 11/20/2016   Adhesive capsulitis of left shoulder 11/19/2016   Greater trochanteric bursitis of right hip 11/19/2016   Past Medical History:  Diagnosis Date   Anxiety 06/01/2021   Arthritis    hands, right knee, left shoulder   Bilateral carpal tunnel syndrome 03/19/2018   Bipolar disorder (HCC)    bipolar 1 disorder   Cervical cancer (HCC)    COPD (chronic obstructive pulmonary disease) (HCC)    emphysema mild per patient   Depression    Dyspnea    with exertion   GERD (gastroesophageal reflux disease)    Hemoptysis 01/11/2021   History of exercise stress test    ETT-Echo 5/18: normal EF, no ischemia   History of hiatal  hernia    no current problems as of 05/22/23 per patient   History of kidney stones    passed stones, no surgery   History of loop recorder 2018   Patient does not have Loop Recorder.  In 2018, the patient wore a heart monitor for 30 days for a low heart rate - study was normal per patient.   HSV-2 (herpes simplex virus 2) infection    tx with valtrex   Pharyngeal dysphagia 01/11/2021   Pneumonia    x 1   PTSD (post-traumatic stress disorder) 09/25/2022    Family History  Problem Relation Age of Onset   Heart attack Mother 28   Heart disease Father    Heart attack Father 55   Liver cancer Father    Bone cancer Other    Breast cancer Maternal Aunt        Diagnosed in 49's   Bone cancer Maternal Aunt     Past Surgical History:  Procedure Laterality Date   CERVICAL CONE BIOPSY     MULTIPLE TOOTH EXTRACTIONS     all teeth extracted - does not wear dentures   TONSILLECTOMY     TUBAL LIGATION     WISDOM TOOTH EXTRACTION     Social History   Occupational History   Occupation: unemployed  Tobacco Use   Smoking status: Every Day    Current packs/day: 1.00    Average packs/day: 1 pack/day for 43.9 years (43.9 ttl pk-yrs)    Types: Cigarettes    Start date: 47   Smokeless tobacco: Never   Tobacco comments:    Smokes 1 pack a day ARJ 01/20/21 - verified 05/22/23  Vaping Use   Vaping status: Never Used  Substance and Sexual Activity   Alcohol use: No   Drug use: Yes    Types: Marijuana    Comment: occasionally - last use was in 01/2023   Sexual activity: Not Currently    Birth control/protection: None

## 2023-05-29 NOTE — Anesthesia Procedure Notes (Signed)
Anesthesia Regional Block: Interscalene brachial plexus block   Pre-Anesthetic Checklist: , timeout performed,  Correct Patient, Correct Site, Correct Laterality,  Correct Procedure, Correct Position, site marked,  Risks and benefits discussed,  Surgical consent,  Pre-op evaluation,  At surgeon's request and post-op pain management  Laterality: Upper and Left  Prep: chloraprep       Needles:  Injection technique: Single-shot  Needle Type: Stimulator Needle - 40     Needle Length: 4cm  Needle Gauge: 22     Additional Needles:   Procedures:,,,, ultrasound used (permanent image in chart),,    Narrative:  Start time: 05/29/2023 10:52 AM End time: 05/29/2023 11:12 AM Injection made incrementally with aspirations every 5 mL.  Performed by: Personally  Anesthesiologist: Lewie Loron, MD  Additional Notes: BP cuff, SpO2 and EKG monitors applied. Sedation begun. Nerve location verified with ultrasound. Anesthetic injected incrementally, slowly, and after neg aspirations under direct u/s guidance. Good perineural spread. Tolerated well.

## 2023-05-29 NOTE — Progress Notes (Signed)
Dr. Lajoyce Corners notified about pt's leaving AMA. MD aware.

## 2023-05-29 NOTE — Transfer of Care (Signed)
Immediate Anesthesia Transfer of Care Note  Patient: BENJIE WORTHAN  Procedure(s) Performed: LEFT SHOULDER ARTHROSCOPY, DEBRIDEMENT, MINI OPEN BICEPS TENODESIS AND ROTATOR CUFF TEAR REPAIR (Left: Shoulder) LEFT CARPAL TUNNEL RELEASE (Left)  Patient Location: PACU  Anesthesia Type:General and Regional  Level of Consciousness: drowsy and patient cooperative  Airway & Oxygen Therapy: Patient Spontanous Breathing and Patient connected to face mask oxygen  Post-op Assessment: Report given to RN and Post -op Vital signs reviewed and stable  Post vital signs: Reviewed and stable  Last Vitals:  Vitals Value Taken Time  BP 144/78 05/29/23 1527  Temp 37 C    Pulse 74 05/29/23 1528  Resp 15 05/29/23 1528  SpO2 100 % 05/29/23 1528  Vitals shown include unfiled device data.  Last Pain:  Vitals:   05/29/23 0940  TempSrc:   PainSc: 7          Complications: No notable events documented.

## 2023-05-29 NOTE — Progress Notes (Addendum)
All pt's belonging packed. Pt went down in a wheelchair with charge nurse, Vivi RN.

## 2023-05-29 NOTE — Anesthesia Preprocedure Evaluation (Addendum)
Anesthesia Evaluation  Patient identified by MRN, date of birth, ID band Patient awake    Reviewed: Allergy & Precautions, NPO status , Patient's Chart, lab work & pertinent test results  Airway Mallampati: II  TM Distance: >3 FB Neck ROM: Full    Dental  (+) Dental Advisory Given, Edentulous Upper, Edentulous Lower   Pulmonary shortness of breath, pneumonia, COPD,  COPD inhaler, Current Smoker   Pulmonary exam normal breath sounds clear to auscultation       Cardiovascular negative cardio ROS Normal cardiovascular exam Rhythm:Regular Rate:Normal  Stress Echo - Stress ECG conclusions: There were no stress arrhythmias or conduction abnormalities. The stress ECG was negative for ischemia.  - Staged echo: There was no echocardiographic evidence for stress-induced ischemia.  - Impressions: Normal exercise stress echo.   Impressions:  - Normal exercise stress echo.     Neuro/Psych  PSYCHIATRIC DISORDERS Anxiety Depression Bipolar Disorder      GI/Hepatic Neg liver ROS, hiatal hernia,GERD  Medicated,,  Endo/Other  negative endocrine ROS    Renal/GU negative Renal ROS     Musculoskeletal  (+) Arthritis ,    Abdominal   Peds  Hematology negative hematology ROS (+)   Anesthesia Other Findings   Reproductive/Obstetrics                             Anesthesia Physical Anesthesia Plan  ASA: 3  Anesthesia Plan: General   Post-op Pain Management: Regional block* and Tylenol PO (pre-op)*   Induction: Intravenous  PONV Risk Score and Plan: 2 and Midazolam, Ondansetron, Dexamethasone and Treatment may vary due to age or medical condition  Airway Management Planned: Oral ETT  Additional Equipment:   Intra-op Plan:   Post-operative Plan: Extubation in OR  Informed Consent: I have reviewed the patients History and Physical, chart, labs and discussed the procedure including the risks, benefits  and alternatives for the proposed anesthesia with the patient or authorized representative who has indicated his/her understanding and acceptance.     Dental advisory given  Plan Discussed with: CRNA  Anesthesia Plan Comments: (Risks of anesthesia explained at length. This includes, but is not limited to, sore throat, damage to teeth, lips gums, tongue and vocal cords, nausea and vomiting, reactions to medications, stroke, heart attack, and death. All patient questions were answered and the patient wishes to proceed. Risks of peripheral nerve block explained at length. This includes, but is not limited to, bleeding, infection, reactions to the medications, seizures, damage to surrounding structures, damage to nerves, permanent weakness, numbness, tingling and pain. All patient questions were answered and patient wishes to proceed with nerve block. )        Anesthesia Quick Evaluation

## 2023-05-29 NOTE — H&P (Signed)
Katie Sherman is an 57 y.o. female.   Chief Complaint: left shoulder pain HPI: Katie Sherman is a 57 y.o. female who presents with  left shoulder pain and left wrist pain. Left shoulder has high grade rotator cuff pathology and biceps tendonitis symptoms . All refractory to 10 months of conservative sxs. Reports daily sxs affecting most adls  and sleep. Patient also has carpal tunnel symptoms.  She did undergo nerve conduction study testing in 2019 which did show mild carpal tunnel syndrome bilaterally.  She currently reports that her symptoms have worsened since then.  Describes daily numbness and tingling affecting the palmar side of digits 1 2 and 3 on the left hand more than the right hand.  Denies much in the way of neck pain or radicular symptoms she states she has loss of dexterity.  Patient did have cortisone shot x 1 in both wrist in 2019 and recently had another cortisone injection.  First injections helped second injections gave no relief.  Both done under ultrasound guidance elsewhere..   Past Medical History:  Diagnosis Date   Anxiety 06/01/2021   Arthritis    hands, right knee, left shoulder   Bilateral carpal tunnel syndrome 03/19/2018   Bipolar disorder (HCC)    bipolar 1 disorder   Cervical cancer (HCC)    COPD (chronic obstructive pulmonary disease) (HCC)    emphysema mild per patient   Depression    Dyspnea    with exertion   GERD (gastroesophageal reflux disease)    Hemoptysis 01/11/2021   History of exercise stress test    ETT-Echo 5/18: normal EF, no ischemia   History of hiatal hernia    no current problems as of 05/22/23 per patient   History of kidney stones    passed stones, no surgery   History of loop recorder 2018   Patient does not have Loop Recorder.  In 2018, the patient wore a heart monitor for 30 days for a low heart rate - study was normal per patient.   HSV-2 (herpes simplex virus 2) infection    tx with valtrex   Pharyngeal dysphagia 01/11/2021    Pneumonia    x 1   PTSD (post-traumatic stress disorder) 09/25/2022    Past Surgical History:  Procedure Laterality Date   CERVICAL CONE BIOPSY     MULTIPLE TOOTH EXTRACTIONS     all teeth extracted - does not wear dentures   TONSILLECTOMY     TUBAL LIGATION     WISDOM TOOTH EXTRACTION      Family History  Problem Relation Age of Onset   Heart attack Mother 94   Heart disease Father    Heart attack Father 16   Liver cancer Father    Bone cancer Other    Breast cancer Maternal Aunt        Diagnosed in 63's   Bone cancer Maternal Aunt    Social History:  reports that she has been smoking cigarettes. She started smoking about 43 years ago. She has a 43.9 pack-year smoking history. She has never used smokeless tobacco. She reports current drug use. Drug: Marijuana. She reports that she does not drink alcohol.  Allergies: No Known Allergies  Medications Prior to Admission  Medication Sig Dispense Refill   albuterol (VENTOLIN HFA) 108 (90 Base) MCG/ACT inhaler Inhale 2 puffs into the lungs every 6 (six) hours as needed for wheezing or shortness of breath. 6.7 g 2   ibuprofen (ADVIL) 600 MG tablet Take  1 tablet (600 mg total) by mouth every 8 (eight) hours as needed. (Patient taking differently: Take 600 mg by mouth every 8 (eight) hours as needed for mild pain (pain score 1-3).) 90 tablet 2   lamoTRIgine (LAMICTAL) 100 MG tablet Take 3 tablets (300 mg total) by mouth daily. 90 tablet 2   nitroGLYCERIN (NITRODUR - DOSED IN MG/24 HR) 0.2 mg/hr patch Apply 1/4 patch daily to the affected area (Patient taking differently: Place 0.2 mg onto the skin daily as needed (For pain).) 30 patch 1   omeprazole (PRILOSEC) 20 MG capsule Take 1 capsule (20 mg total) by mouth daily. 90 capsule 0   oxyCODONE-acetaminophen (PERCOCET) 10-325 MG tablet Take 1 tablet by mouth every 6 (six) hours as needed for mild to moderate pain. 120 tablet 0   pravastatin (PRAVACHOL) 40 MG tablet Take 1 tablet (40 mg  total) by mouth daily. 90 tablet 1   valACYclovir (VALTREX) 500 MG tablet Take 1 tablet (500 mg total) by mouth daily. 60 tablet 1   Vitamin D, Ergocalciferol, (DRISDOL) 1.25 MG (50000 UNIT) CAPS capsule Take 1 capsule (50,000 Units total) by mouth every 7 (seven) days. (Patient taking differently: Take 50,000 Units by mouth every 7 (seven) days. Takes on Friday) 5 capsule 5   cyclobenzaprine (FLEXERIL) 10 MG tablet Take 1 tablet (10 mg total) by mouth 3 (three) times daily as needed for muscle spasms. 30 tablet 2   doxycycline (VIBRAMYCIN) 100 MG capsule Take 1 capsule (100 mg total) by mouth 2 (two) times daily. (Patient not taking: Reported on 05/17/2023) 20 capsule 0   meloxicam (MOBIC) 7.5 MG tablet Take 1 tablet (7.5 mg total) by mouth daily for inflammation. (Patient not taking: Reported on 05/17/2023) 90 tablet 0   naloxone (NARCAN) nasal spray 4 mg/0.1 mL Use in case of accidental overdose 2 each 0   oxyCODONE-acetaminophen (PERCOCET) 10-325 MG tablet Take 1 tablet by mouth every 6 (six) hours as needed for moderate pain (Patient not taking: Reported on 05/17/2023) 120 tablet 0   oxyCODONE-acetaminophen (PERCOCET) 10-325 MG tablet Take 1 tablet by mouth every 6 (six) hours as needed for moderate pain (Patient not taking: Reported on 02/01/2023) 120 tablet 0   oxyCODONE-acetaminophen (PERCOCET) 10-325 MG tablet Take 1 tablet by mouth every 6 (six) hours as needed for moderate pain. (Patient not taking: Reported on 05/17/2023) 120 tablet 0   oxyCODONE-acetaminophen (PERCOCET) 10-325 MG tablet Take 1 tablet by mouth every 6 (six) hours as needed for moderate pain. (Patient not taking: Reported on 05/17/2023) 120 tablet 0   oxyCODONE-acetaminophen (PERCOCET) 10-325 MG tablet Take 1 tablet by mouth every 4 to 6 hours as needed for post-operative pain. 150 tablet 0   QUEtiapine (SEROQUEL) 100 MG tablet Take 1 tablet (100 mg total) by mouth at bedtime. (Patient not taking: Reported on 05/17/2023) 30 tablet 2    traZODone (DESYREL) 50 MG tablet Take 1 tablet (50 mg total) by mouth at bedtime as needed for sleep. (Patient not taking: Reported on 02/01/2023) 30 tablet 2    No results found for this or any previous visit (from the past 48 hour(s)). No results found.  Review of Systems  Musculoskeletal:  Positive for arthralgias.  All other systems reviewed and are negative.   Blood pressure (!) 140/83, pulse 83, temperature 97.9 F (36.6 C), temperature source Oral, resp. rate 20, height 5\' 7"  (1.702 m), weight 84.4 kg, last menstrual period 04/29/2012, SpO2 94%. Physical Exam Vitals reviewed.  HENT:  Head: Normocephalic.     Nose: Nose normal.     Mouth/Throat:     Mouth: Mucous membranes are moist.  Eyes:     Pupils: Pupils are equal, round, and reactive to light.  Cardiovascular:     Rate and Rhythm: Normal rate.     Pulses: Normal pulses.  Pulmonary:     Effort: Pulmonary effort is normal.  Abdominal:     General: Abdomen is flat.  Musculoskeletal:     Cervical back: Normal range of motion.  Skin:    General: Skin is warm.     Capillary Refill: Capillary refill takes less than 2 seconds.  Neurological:     General: No focal deficit present.     Mental Status: She is alert.  Psychiatric:        Mood and Affect: Mood normal.   Ortho examination on the left hand demonstrates EPL FPL interosseous strength intact.  Slight flattening abductor pollicis brevis but functionally it is intact.  Wrist range of motion intact.  No tenderness over the radial styloid.  Negative Tinel's cubital tunnel with no subluxation of the ulnar nerve.  Left shoulder demonstrates fairly symmetric range of motion compared to the right but she does have external rotation and abduction weakness with pain.  Does have tenderness to the bicipital groove palpation as well as positive O'Brien's testing.  No discrete AC joint tenderness left versus right.  No other masses lymphadenopathy or skin changes noted in  that shoulder girdle region.  Slight crepitus with internal and external rotation of that left arm with range of motion above 90 degrees.  Assessment/Plan Impression is left shoulder pain with likely near full-thickness or perhaps full-thickness at this time rotator cuff tear of the supraspinatus.  She has had progressive symptoms and weakness with rotator cuff testing and has failed nonoperative management.  She also has classic carpal tunnel syndromes with ADLs affected as well as failure of conservative treatment including injections and splinting.  Because of the progressive nature of symptoms in both the shoulder and the hand the plan at this time is arthroscopy with biceps tendon release and mini open rotator cuff tear repair.  Depending on the quality of the tissue we may augment that with the patch.  Biceps tenodesis also to be performed along with carpal tunnel surgery.  Risk and benefits are discussed with the patient including not limited to infection nerve vessel damage incomplete pain relief as well as incomplete restoration of function.  Patient understands risk and benefits and wishes to proceed.  Mitigating factors which will likely affect her recovery includes her current enrollment in pain management.  Also her social situation has been in flux in the past.  Nonetheless the patient wishes to proceed with operative management.  All questions answered.  Burnard Bunting, MD 05/29/2023, 10:33 AM

## 2023-05-29 NOTE — Anesthesia Procedure Notes (Signed)
Procedure Name: Intubation Date/Time: 05/29/2023 12:03 PM  Performed by: Quincy Carnes, CRNAPre-anesthesia Checklist: Patient identified, Emergency Drugs available, Suction available and Patient being monitored Patient Re-evaluated:Patient Re-evaluated prior to induction Oxygen Delivery Method: Circle System Utilized Preoxygenation: Pre-oxygenation with 100% oxygen Induction Type: IV induction Ventilation: Mask ventilation without difficulty Laryngoscope Size: Mac and 3 Grade View: Grade I Tube type: Oral Tube size: 7.0 mm Number of attempts: 1 Airway Equipment and Method: Stylet Placement Confirmation: ETT inserted through vocal cords under direct vision, positive ETCO2 and breath sounds checked- equal and bilateral Secured at: 21 cm Tube secured with: Tape Dental Injury: Teeth and Oropharynx as per pre-operative assessment

## 2023-05-30 ENCOUNTER — Encounter (HOSPITAL_COMMUNITY): Payer: Self-pay | Admitting: Orthopedic Surgery

## 2023-05-30 ENCOUNTER — Telehealth: Payer: Self-pay

## 2023-05-30 DIAGNOSIS — M7522 Bicipital tendinitis, left shoulder: Secondary | ICD-10-CM | POA: Diagnosis not present

## 2023-05-30 NOTE — Telephone Encounter (Signed)
Patient would like a call back to discuss what she needs to do now that she has been discharged from the hospital.  Has a question concerning ice machine for her left shoulder.  Cb# [phone].    Patient had left shoulder surgery on 05/29/2023.  Please advise.  Thank you.

## 2023-05-30 NOTE — Transitions of Care (Post Inpatient/ED Visit) (Signed)
   05/30/2023  Name: Katie Sherman MRN: [id number] DOB: May 29, 1966  Today's TOC FU Call Status:   Patient's Name and Date of Birth confirmed.  Transition Care Management Follow-up Telephone Call Date of Discharge: 05/29/23 Discharge Facility: [facility]) Type of Discharge: Inpatient Admission (Patient left AMA) Primary Inpatient Discharge Diagnosis:: S/P rotator cuff repair How have you been since you were released from the hospital?: Worse Any questions or concerns?: Yes Patient Questions/Concerns:: Patient in pain 10/10 patient has not been icing shoulder or hand. Because she left hospital AMA and was not given D/C instructions Patient Questions/Concerns Addressed: Other: (icing instructions of left shoulder post surgery and post-precautions)  Items Reviewed: Did you receive and understand the discharge instructions provided?: No (AMA) Medications obtained,verified, and reconciled?: No (no d/c instructionor AVS left AMA) Medications Not Reviewed Reasons:: Other: (no d/c med given , patient voiced as for as she knows her medication have not changed and know them all and does not need help) Any new allergies since your discharge?: No Dietary orders reviewed?: NA Do you have support at home?: Yes People in Home: other relative(s) (living with a friend)  Medications Reviewed Today: Medications Reviewed Today   Medications were not reviewed in this encounter     Home Care and Equipment/Supplies: Were Home Health Services Ordered?: No Any new equipment or medical supplies ordered?: No  Functional Questionnaire: Do you need assistance with bathing/showering or dressing?: No Do you need assistance with meal preparation?: No Do you need assistance with eating?: No Do you have difficulty maintaining continence: No Do you need assistance with getting out of bed/getting out of a chair/moving?: No Do you have difficulty managing or taking your medications?: No  Follow up  appointments reviewed: PCP Follow-up appointment confirmed?: Yes Date of PCP follow-up appointment?: 06/11/23 Follow-up Provider: [facility] Follow-up appointment confirmed?: Yes Date of Specialist follow-up appointment?: 06/06/23 Follow-Up Specialty Provider:: ortho Do you need transportation to your follow-up appointment?: No Do you understand care options if your condition(s) worsen?: Yes-patient verbalized understanding    SIGNATURE Elsie Lincoln, RN, BSN

## 2023-05-30 NOTE — Telephone Encounter (Signed)
Patient left AMA from the hospital.  She states that when she was leaving AMA she was not allowed to take her ice machine with her.  I am sending this message back to myself so I can check into this  Additionally, I called and spoke with patient and I am going to send her postop instructions on MyChart.  She has postop pain medicine as prescribed by her pain management provider.

## 2023-05-31 NOTE — Telephone Encounter (Signed)
Ok anything you need me to help you with?

## 2023-06-03 DIAGNOSIS — M7522 Bicipital tendinitis, left shoulder: Secondary | ICD-10-CM

## 2023-06-03 DIAGNOSIS — M65912 Unspecified synovitis and tenosynovitis, left shoulder: Secondary | ICD-10-CM

## 2023-06-03 DIAGNOSIS — M75122 Complete rotator cuff tear or rupture of left shoulder, not specified as traumatic: Secondary | ICD-10-CM

## 2023-06-03 DIAGNOSIS — S43432A Superior glenoid labrum lesion of left shoulder, initial encounter: Secondary | ICD-10-CM

## 2023-06-05 NOTE — Telephone Encounter (Signed)
No you are okay. Thanks though. I called the desk at [facility] at [facility] and they advised that the ice machine was actually taken with [facility] Citizen when she left AMA from the hospital.

## 2023-06-06 ENCOUNTER — Encounter: Payer: Self-pay | Admitting: Surgical

## 2023-06-06 ENCOUNTER — Ambulatory Visit: Payer: Medicaid Other | Admitting: Surgical

## 2023-06-06 DIAGNOSIS — M75112 Incomplete rotator cuff tear or rupture of left shoulder, not specified as traumatic: Secondary | ICD-10-CM

## 2023-06-06 NOTE — Progress Notes (Signed)
Post-Op Visit Note   Patient: Katie Sherman           Date of Birth: 04/06/1966           MRN: [id number] Visit Date: 06/06/2023 PCP: Storm Frisk, MD   Assessment & Plan:  Chief Complaint:  Chief Complaint  Patient presents with   Left Shoulder - Routine Post Op   Visit Diagnoses:  1. Incomplete rotator cuff tear or rupture of left shoulder, not specified as traumatic     Plan: FALESHA HENRICK is a 57 y.o. female who presents s/p left shoulder rotator cuff repair and biceps tenodesis on 05/29/2023 with carpal tunnel release on the same date..  Patient is doing well and pain is overall controlled.  Not really using the CPM machine; this is too painful and she is preferring to use passive motion with the assistance of the nonoperative arm.  She is avoiding active range of motion or lifting with the arm..  Denies any chest pain, SOB, fevers, chills.  She has been able to wean herself off of pain medication and is not taking any opioid pain medicine over the last 4 days.  On exam, patient has range of motion deferred to next visit due to patient's pain.  Intact EPL, FPL, finger abduction, finger adduction, pronation/supination, bicep, tricep, deltoid of operative extremity.  Axillary nerve intact with deltoid firing.  Incisions are healing well without evidence of infection or dehiscence.  Sutures removed and replaced with Steri-Strips today in the shoulder.  2+ radial pulse of the operative extremity.  Carpal tunnel incision has sutures intact with no evidence of infection or dehiscence.  Will plan to remove these sutures next week so these were left in.  Plan is follow-up next Wednesday for removal of carpal tunnel sutures.  In the meantime she will continue with passive motion both with her nonoperative arm and with the use of the machine when she can tolerate it.  Strongly encouraged her to avoid any active range of motion of the operative shoulder or any lifting with the  operative arm.  She understands and agrees with plan.  Follow-up next week..   Follow-Up Instructions: Return in about 1 week (around 06/13/2023).   Orders:  No orders of the defined types were placed in this encounter.  No orders of the defined types were placed in this encounter.   Imaging: No results found.  PMFS History: Patient Active Problem List   Diagnosis Date Noted   Synovitis of left shoulder 06/03/2023   Degenerative superior labral anterior-to-posterior (SLAP) tear of left shoulder 06/03/2023   Tendonitis of upper biceps tendon of left shoulder 06/03/2023   Complete tear of left rotator cuff 06/03/2023   S/P rotator cuff repair 05/29/2023   Homelessness 02/07/2022   Chronic pain syndrome 02/07/2022   Costochondritis 09/05/2021   Tobacco dependence 08/07/2020   Lung nodules 02/16/2020   Vitamin D deficiency 02/19/2019   Carpal tunnel syndrome, left upper limb 03/19/2018   Mixed hyperlipidemia 11/14/2017   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   Postmenopausal 01/22/2017   Bipolar 1 disorder, depressed (HCC) 12/29/2016   Family history of suicide 11/20/2016   Depression 11/20/2016   Family history of breast cancer 11/20/2016   Adhesive capsulitis of left shoulder 11/19/2016   Greater trochanteric bursitis of right hip 11/19/2016   Past Medical History:  Diagnosis Date   Anxiety 06/01/2021   Arthritis    hands, right knee, left shoulder   Bilateral  carpal tunnel syndrome 03/19/2018   Bipolar disorder (HCC)    bipolar 1 disorder   Cervical cancer (HCC)    COPD (chronic obstructive pulmonary disease) (HCC)    emphysema mild per patient   Depression    Dyspnea    with exertion   GERD (gastroesophageal reflux disease)    Hemoptysis 01/11/2021   History of exercise stress test    ETT-Echo 5/18: normal EF, no ischemia   History of hiatal hernia    no current problems as of 05/22/23 per patient   History of kidney stones    passed stones, no  surgery   History of loop recorder 2018   Patient does not have Loop Recorder.  In 2018, the patient wore a heart monitor for 30 days for a low heart rate - study was normal per patient.   HSV-2 (herpes simplex virus 2) infection    tx with valtrex   Pharyngeal dysphagia 01/11/2021   Pneumonia    x 1   PTSD (post-traumatic stress disorder) 09/25/2022    Family History  Problem Relation Age of Onset   Heart attack Mother 12   Heart disease Father    Heart attack Father 59   Liver cancer Father    Bone cancer Other    Breast cancer Maternal Aunt        Diagnosed in 89's   Bone cancer Maternal Aunt     Past Surgical History:  Procedure Laterality Date   CARPAL TUNNEL RELEASE Left 05/29/2023   Procedure: LEFT CARPAL TUNNEL RELEASE;  Surgeon: Cammy Copa, MD;  Location: [facility] OR;  Service: Orthopedics;  Laterality: Left;   CERVICAL CONE BIOPSY     MULTIPLE TOOTH EXTRACTIONS     all teeth extracted - does not wear dentures   SHOULDER ARTHROSCOPY WITH SUBACROMIAL DECOMPRESSION, ROTATOR CUFF REPAIR AND BICEP TENDON REPAIR Left 05/29/2023   Procedure: LEFT SHOULDER ARTHROSCOPY, DEBRIDEMENT, MINI OPEN BICEPS TENODESIS AND ROTATOR CUFF TEAR REPAIR;  Surgeon: Cammy Copa, MD;  Location: [facility] OR;  Service: Orthopedics;  Laterality: Left;   TONSILLECTOMY     TUBAL LIGATION     WISDOM TOOTH EXTRACTION     Social History   Occupational History   Occupation: unemployed  Tobacco Use   Smoking status: Every Day    Current packs/day: 1.00    Average packs/day: 1 pack/day for 43.9 years (43.9 ttl pk-yrs)    Types: Cigarettes    Start date: 68   Smokeless tobacco: Never   Tobacco comments:    Smokes 1 pack a day ARJ 01/20/21 - verified 05/22/23  Vaping Use   Vaping status: Never Used  Substance and Sexual Activity   Alcohol use: No   Drug use: Yes    Types: Marijuana    Comment: occasionally - last use was in 01/2023   Sexual activity: Not Currently    Birth  control/protection: None

## 2023-06-11 ENCOUNTER — Other Ambulatory Visit (HOSPITAL_COMMUNITY): Payer: Self-pay

## 2023-06-11 ENCOUNTER — Ambulatory Visit (INDEPENDENT_AMBULATORY_CARE_PROVIDER_SITE_OTHER): Payer: Medicaid Other | Admitting: Family Medicine

## 2023-06-11 ENCOUNTER — Encounter: Payer: Self-pay | Admitting: Family Medicine

## 2023-06-11 VITALS — BP 135/88 | HR 75 | Temp 98.3°F | Resp 18 | Ht 67.0 in | Wt 188.2 lb

## 2023-06-11 DIAGNOSIS — F319 Bipolar disorder, unspecified: Secondary | ICD-10-CM

## 2023-06-11 DIAGNOSIS — F17200 Nicotine dependence, unspecified, uncomplicated: Secondary | ICD-10-CM

## 2023-06-11 DIAGNOSIS — K219 Gastro-esophageal reflux disease without esophagitis: Secondary | ICD-10-CM

## 2023-06-11 DIAGNOSIS — E785 Hyperlipidemia, unspecified: Secondary | ICD-10-CM

## 2023-06-11 DIAGNOSIS — S43432A Superior glenoid labrum lesion of left shoulder, initial encounter: Secondary | ICD-10-CM | POA: Diagnosis not present

## 2023-06-11 DIAGNOSIS — B009 Herpesviral infection, unspecified: Secondary | ICD-10-CM

## 2023-06-11 MED ORDER — CYCLOBENZAPRINE HCL 10 MG PO TABS
10.0000 mg | ORAL_TABLET | Freq: Three times a day (TID) | ORAL | 2 refills | Status: AC | PRN
  Filled 2023-06-11: qty 30, 10d supply, fill #0
  Filled 2023-12-05: qty 30, 10d supply, fill #1
  Filled 2024-05-09 – 2024-06-04 (×3): qty 30, 10d supply, fill #2

## 2023-06-11 MED ORDER — PRAVASTATIN SODIUM 40 MG PO TABS
40.0000 mg | ORAL_TABLET | Freq: Every day | ORAL | 1 refills | Status: DC
  Filled 2023-06-11 – 2023-07-02 (×2): qty 90, 90d supply, fill #0
  Filled 2023-10-17: qty 90, 90d supply, fill #1

## 2023-06-11 MED ORDER — OMEPRAZOLE 20 MG PO CPDR
20.0000 mg | DELAYED_RELEASE_CAPSULE | Freq: Every day | ORAL | 1 refills | Status: DC
  Filled 2023-06-11 – 2023-07-02 (×2): qty 90, 90d supply, fill #0
  Filled 2023-10-17: qty 90, 90d supply, fill #1

## 2023-06-11 MED ORDER — LAMOTRIGINE 100 MG PO TABS
300.0000 mg | ORAL_TABLET | Freq: Every day | ORAL | 1 refills | Status: DC
  Filled 2023-06-11: qty 90, 30d supply, fill #0

## 2023-06-11 MED ORDER — VALACYCLOVIR HCL 500 MG PO TABS
500.0000 mg | ORAL_TABLET | Freq: Every day | ORAL | 1 refills | Status: DC
  Filled 2023-06-11: qty 14, 14d supply, fill #0
  Filled 2023-07-02: qty 14, 14d supply, fill #1
  Filled 2023-07-19: qty 14, 14d supply, fill #2
  Filled 2023-08-06: qty 14, 14d supply, fill #3
  Filled 2023-08-26: qty 14, 14d supply, fill #4
  Filled 2023-09-10: qty 14, 14d supply, fill #5
  Filled 2023-09-30: qty 14, 14d supply, fill #6
  Filled 2023-10-17: qty 14, 14d supply, fill #7
  Filled 2023-11-04: qty 14, 14d supply, fill #8
  Filled 2023-11-21: qty 14, 14d supply, fill #9
  Filled 2023-12-05: qty 14, 14d supply, fill #10
  Filled 2023-12-21: qty 14, 14d supply, fill #11

## 2023-06-11 NOTE — Progress Notes (Unsigned)
Established Patient Office Visit  Subjective    Patient ID: Katie Sherman, female    DOB: October 18, 1965  Age: 57 y.o. MRN: [id number]  CC:  Chief Complaint  Patient presents with   Establish Care    Was a MD [facility] Patient, need medication refill    HPI Katie Sherman presents to establish care with a new provider and for follow up of recent rotator cuff repair. She also needs refills of her chronic meds. She denies acute complaints.   Outpatient Encounter Medications as of 06/11/2023  Medication Sig   albuterol (VENTOLIN HFA) 108 (90 Base) MCG/ACT inhaler Inhale 2 puffs into the lungs every 6 (six) hours as needed for wheezing or shortness of breath.   naloxone (NARCAN) nasal spray 4 mg/0.1 mL Use in case of accidental overdose   nitroGLYCERIN (NITRODUR - DOSED IN MG/24 HR) 0.2 mg/hr patch Apply 1/4 patch daily to the affected area (Patient taking differently: Place 0.2 mg onto the skin daily as needed (For pain).)   oxyCODONE-acetaminophen (PERCOCET) 10-325 MG tablet Take 1 tablet by mouth every 6 (six) hours as needed for mild to moderate pain.   oxyCODONE-acetaminophen (PERCOCET) 10-325 MG tablet Take 1 tablet by mouth every 4 to 6 hours as needed for post-operative pain.   Vitamin D, Ergocalciferol, (DRISDOL) 1.25 MG (50000 UNIT) CAPS capsule Take 1 capsule (50,000 Units total) by mouth every 7 (seven) days. (Patient taking differently: Take 50,000 Units by mouth every 7 (seven) days. Takes on Friday)   [DISCONTINUED] lamoTRIgine (LAMICTAL) 100 MG tablet Take 3 tablets (300 mg total) by mouth daily.   [DISCONTINUED] omeprazole (PRILOSEC) 20 MG capsule Take 1 capsule (20 mg total) by mouth daily.   [DISCONTINUED] pravastatin (PRAVACHOL) 40 MG tablet Take 1 tablet (40 mg total) by mouth daily.   [DISCONTINUED] valACYclovir (VALTREX) 500 MG tablet Take 1 tablet (500 mg total) by mouth daily.   cyclobenzaprine (FLEXERIL) 10 MG tablet Take 1 tablet (10 mg total) by mouth 3 (three)  times daily as needed for muscle spasms.   ibuprofen (ADVIL) 600 MG tablet Take 1 tablet (600 mg total) by mouth every 8 (eight) hours as needed. (Patient taking differently: Take 600 mg by mouth every 8 (eight) hours as needed for mild pain (pain score 1-3).)   lamoTRIgine (LAMICTAL) 100 MG tablet Take 3 tablets (300 mg total) by mouth daily.   omeprazole (PRILOSEC) 20 MG capsule Take 1 capsule (20 mg total) by mouth daily.   oxyCODONE-acetaminophen (PERCOCET) 10-325 MG tablet Take 1 tablet by mouth every 6 (six) hours as needed for moderate pain (Patient not taking: Reported on 05/17/2023)   oxyCODONE-acetaminophen (PERCOCET) 10-325 MG tablet Take 1 tablet by mouth every 6 (six) hours as needed for moderate pain (Patient not taking: Reported on 02/01/2023)   oxyCODONE-acetaminophen (PERCOCET) 10-325 MG tablet Take 1 tablet by mouth every 6 (six) hours as needed for moderate pain. (Patient not taking: Reported on 05/17/2023)   oxyCODONE-acetaminophen (PERCOCET) 10-325 MG tablet Take 1 tablet by mouth every 6 (six) hours as needed for moderate pain. (Patient not taking: Reported on 05/17/2023)   pravastatin (PRAVACHOL) 40 MG tablet Take 1 tablet (40 mg total) by mouth daily.   QUEtiapine (SEROQUEL) 100 MG tablet Take 1 tablet (100 mg total) by mouth at bedtime. (Patient not taking: Reported on 05/17/2023)   traZODone (DESYREL) 50 MG tablet Take 1 tablet (50 mg total) by mouth at bedtime as needed for sleep. (Patient not taking: Reported on 02/01/2023)  valACYclovir (VALTREX) 500 MG tablet Take 1 tablet (500 mg total) by mouth daily.   [DISCONTINUED] cyclobenzaprine (FLEXERIL) 10 MG tablet Take 1 tablet (10 mg total) by mouth 3 (three) times daily as needed for muscle spasms.   [DISCONTINUED] doxycycline (VIBRAMYCIN) 100 MG capsule Take 1 capsule (100 mg total) by mouth 2 (two) times daily. (Patient not taking: Reported on 05/17/2023)   [DISCONTINUED] meloxicam (MOBIC) 7.5 MG tablet Take 1 tablet (7.5 mg  total) by mouth daily for inflammation. (Patient not taking: Reported on 05/17/2023)   No facility-administered encounter medications on file as of 06/11/2023.    Past Medical History:  Diagnosis Date   Anxiety 06/01/2021   Arthritis    hands, right knee, left shoulder   Bilateral carpal tunnel syndrome 03/19/2018   Bipolar disorder (HCC)    bipolar 1 disorder   Cervical cancer (HCC)    COPD (chronic obstructive pulmonary disease) (HCC)    emphysema mild per patient   Depression    Dyspnea    with exertion   GERD (gastroesophageal reflux disease)    Hemoptysis 01/11/2021   History of exercise stress test    ETT-Echo 5/18: normal EF, no ischemia   History of hiatal hernia    no current problems as of 05/22/23 per patient   History of kidney stones    passed stones, no surgery   History of loop recorder 2018   Patient does not have Loop Recorder.  In 2018, the patient wore a heart monitor for 30 days for a low heart rate - study was normal per patient.   HSV-2 (herpes simplex virus 2) infection    tx with valtrex   Pharyngeal dysphagia 01/11/2021   Pneumonia    x 1   PTSD (post-traumatic stress disorder) 09/25/2022    Past Surgical History:  Procedure Laterality Date   CARPAL TUNNEL RELEASE Left 05/29/2023   Procedure: LEFT CARPAL TUNNEL RELEASE;  Surgeon: Cammy Copa, MD;  Location[facility] OR;  Service: Orthopedics;  Laterality: Left;   CERVICAL CONE BIOPSY     MULTIPLE TOOTH EXTRACTIONS     all teeth extracted - does not wear dentures   SHOULDER ARTHROSCOPY WITH SUBACROMIAL DECOMPRESSION, ROTATOR CUFF REPAIR AND BICEP TENDON REPAIR Left 05/29/2023   Procedure: LEFT SHOULDER ARTHROSCOPY, DEBRIDEMENT, MINI OPEN BICEPS TENODESIS AND ROTATOR CUFF TEAR REPAIR;  Surgeon: Cammy Copa, MD;  Location: [facility] OR;  Service: Orthopedics;  Laterality: Left;   TONSILLECTOMY     TUBAL LIGATION     WISDOM TOOTH EXTRACTION      Family History  Problem Relation Age of Onset    Heart attack Mother 7   Heart disease Father    Heart attack Father 57   Liver cancer Father    Bone cancer Other    Breast cancer Maternal Aunt        Diagnosed in 68's   Bone cancer Maternal Aunt     Social History   Socioeconomic History   Marital status: Widowed    Spouse name: Not on file   Number of children: Not on file   Years of education: Not on file   Highest education level: Not on file  Occupational History   Occupation: unemployed  Tobacco Use   Smoking status: Every Day    Current packs/day: 1.00    Average packs/day: 1 pack/day for 43.9 years (43.9 ttl pk-yrs)    Types: Cigarettes    Start date: 79   Smokeless tobacco: Never  Tobacco comments:    Smokes 1 pack a day ARJ 01/20/21 - verified 05/22/23  Vaping Use   Vaping status: Never Used  Substance and Sexual Activity   Alcohol use: No   Drug use: Yes    Types: Marijuana    Comment: occasionally - last use was in 01/2023   Sexual activity: Not Currently    Birth control/protection: None  Other Topics Concern   Not on file  Social History Narrative   Prior traffic control specialist - unemployed now   Widow   5 children, 10 grandchildren   Moved here from [place] 2013   Social Determinants of Health   Financial Resource Strain: Low Risk  (06/11/2023)   Overall Financial Resource Strain (CARDIA)    Difficulty of Paying Living Expenses: Not hard at all  Food Insecurity: No Food Insecurity (05/29/2023)   Hunger Vital Sign    Worried About Running Out of Food in the Last Year: Never true    Ran Out of Food in the Last Year: Never true  Transportation Needs: No Transportation Needs (05/29/2023)   PRAPARE - Administrator, Civil Service (Medical): No    Lack of Transportation (Non-Medical): No  Physical Activity: Inactive (06/11/2023)   Exercise Vital Sign    Days of Exercise per Week: 0 days    Minutes of Exercise per Session: 0 min  Stress: Stress Concern Present (06/11/2023)    [employer] of Occupational Health - Occupational Stress Questionnaire    Feeling of Stress : Rather much  Social Connections: Socially Isolated (06/11/2023)   Social Connection and Isolation Panel [NHANES]    Frequency of Communication with Friends and Family: More than three times a week    Frequency of Social Gatherings with Friends and Family: More than three times a week    Attends Religious Services: Never    Database administrator or Organizations: No    Attends Banker Meetings: Never    Marital Status: Widowed  Intimate Partner Violence: Not At Risk (05/29/2023)   Humiliation, Afraid, Rape, and Kick questionnaire    Fear of Current or Ex-Partner: No    Emotionally Abused: No    Physically Abused: No    Sexually Abused: No    Review of Systems  All other systems reviewed and are negative.       Objective    BP 135/88 (BP Location: Right Arm, Patient Position: Sitting, Cuff Size: Normal)   Pulse 75   Temp 98.3 F (36.8 C) (Oral)   Resp 18   Ht 5\' 7"  (1.702 m)   Wt 188 lb 3.2 oz (85.4 kg)   LMP 04/29/2012   SpO2 97%   BMI 29.48 kg/m   Physical Exam Vitals and nursing note reviewed.  Constitutional:      General: She is not in acute distress. Cardiovascular:     Rate and Rhythm: Normal rate and regular rhythm.  Pulmonary:     Effort: Pulmonary effort is normal.     Breath sounds: Normal breath sounds.  Abdominal:     Palpations: Abdomen is soft.     Tenderness: There is no abdominal tenderness.  Musculoskeletal:     Comments: Patient in left sided shoulder brace/sling  Neurological:     General: No focal deficit present.     Mental Status: She is alert and oriented to person, place, and time.  Psychiatric:        Mood and Affect: Mood normal.  Behavior: Behavior normal.         Assessment & Plan:   1. Degenerative superior labral anterior-to-posterior (SLAP) tear of left shoulder Improving. Management as per  consultant  2. Bipolar 1 disorder, depressed (HCC)  - lamoTRIgine (LAMICTAL) 100 MG tablet; Take 3 tablets (300 mg total) by mouth daily.  Dispense: 90 tablet; Refill: 1  3. Gastroesophageal reflux disease without esophagitis  - omeprazole (PRILOSEC) 20 MG capsule; Take 1 capsule (20 mg total) by mouth daily.  Dispense: 90 capsule; Refill: 1  4. Hyperlipidemia, unspecified hyperlipidemia type Continue . Meds refilled  5. Herpes Meds refilled.   6. Smoker Discussed cessation/reduction  Return in about 6 months (around 12/10/2023) for follow up.   Tommie Raymond, MD

## 2023-06-13 ENCOUNTER — Encounter: Payer: Self-pay | Admitting: Family Medicine

## 2023-06-13 ENCOUNTER — Ambulatory Visit: Payer: Medicaid Other | Admitting: Surgical

## 2023-06-13 ENCOUNTER — Encounter: Payer: Self-pay | Admitting: Surgical

## 2023-06-13 DIAGNOSIS — M75112 Incomplete rotator cuff tear or rupture of left shoulder, not specified as traumatic: Secondary | ICD-10-CM

## 2023-06-13 NOTE — Progress Notes (Signed)
Post-Op Visit Note   Patient: Katie Sherman           Date of Birth: 02-Aug-1965           MRN: [id number] Visit Date: 06/13/2023 PCP: Georganna Skeans, MD   Assessment & Plan:  Chief Complaint:  Chief Complaint  Patient presents with   Left Shoulder - Follow-up    Left shoulder scope 05/29/2023 and Carpal Tunnel Release    Visit Diagnoses:  1. Incomplete rotator cuff tear or rupture of left shoulder, not specified as traumatic     Plan: [name] is a 57 y.o. female who presents s/p left shoulder rotator cuff repair and biceps tenodesis on 05/29/2023 with carpal tunnel release on the same date..  Patient returns for suture removal from her carpal tunnel incision.  States she is not taking much pain medication for the last several days and really does not have any significant pain unless she moves the "wrong way".  No fevers or chills.  She has not been able to use the CPM machine due to pain level.  On exam, patient has range of motion 10 degrees X rotation, 60 degrees abduction, 45 degrees forward elevation passively..  Intact EPL, FPL, finger abduction, finger adduction, pronation/supination, bicep, tricep, deltoid of operative extremity.  Axillary nerve intact with deltoid firing.  Incisions are healing well without evidence of infection or dehiscence.    Plan is start physical therapy in 2 weeks at [facility].  At that point (1 month out) she will be okay for passive range of motion and active assisted range of motion but no full active range of motion or any rotator cuff strengthening exercises until she is 6 weeks postop.  Again I discouraged Velvet from any active range of motion or lifting with the operative extremity.  She understands and agrees with plan.  Follow-up in 3 weeks for clinical recheck with Dr. August Saucer.  Follow-Up Instructions: No follow-ups on file.   Orders:  Orders Placed This Encounter  Procedures   Ambulatory referral to Physical Therapy    No orders of the defined types were placed in this encounter.   Imaging: No results found.  PMFS History: Patient Active Problem List   Diagnosis Date Noted   Synovitis of left shoulder 06/03/2023   Degenerative superior labral anterior-to-posterior (SLAP) tear of left shoulder 06/03/2023   Tendonitis of upper biceps tendon of left shoulder 06/03/2023   Complete tear of left rotator cuff 06/03/2023   S/P rotator cuff repair 05/29/2023   Homelessness 02/07/2022   Chronic pain syndrome 02/07/2022   Costochondritis 09/05/2021   Tobacco dependence 08/07/2020   Lung nodules 02/16/2020   Vitamin D deficiency 02/19/2019   Carpal tunnel syndrome, left upper limb 03/19/2018   Mixed hyperlipidemia 11/14/2017   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   Postmenopausal 01/22/2017   Bipolar 1 disorder, depressed (HCC) 12/29/2016   Family history of suicide 11/20/2016   Depression 11/20/2016   Family history of breast cancer 11/20/2016   Adhesive capsulitis of left shoulder 11/19/2016   Greater trochanteric bursitis of right hip 11/19/2016   Past Medical History:  Diagnosis Date   Anxiety 06/01/2021   Arthritis    hands, right knee, left shoulder   Bilateral carpal tunnel syndrome 03/19/2018   Bipolar disorder (HCC)    bipolar 1 disorder   Cervical cancer (HCC)    COPD (chronic obstructive pulmonary disease) (HCC)    emphysema mild per patient   Depression  Dyspnea    with exertion   GERD (gastroesophageal reflux disease)    Hemoptysis 01/11/2021   History of exercise stress test    ETT-Echo 5/18: normal EF, no ischemia   History of hiatal hernia    no current problems as of 05/22/23 per patient   History of kidney stones    passed stones, no surgery   History of loop recorder 2018   Patient does not have Loop Recorder.  In 2018, the patient wore a heart monitor for 30 days for a low heart rate - study was normal per patient.   HSV-2 (herpes simplex virus 2)  infection    tx with valtrex   Pharyngeal dysphagia 01/11/2021   Pneumonia    x 1   PTSD (post-traumatic stress disorder) 09/25/2022    Family History  Problem Relation Age of Onset   Heart attack Mother 6   Heart disease Father    Heart attack Father 19   Liver cancer Father    Bone cancer Other    Breast cancer Maternal Aunt        Diagnosed in 31's   Bone cancer Maternal Aunt     Past Surgical History:  Procedure Laterality Date   CARPAL TUNNEL RELEASE Left 05/29/2023   Procedure: LEFT CARPAL TUNNEL RELEASE;  Surgeon: Cammy Copa, MD;  Location: [facility] OR;  Service: Orthopedics;  Laterality: Left;   CERVICAL CONE BIOPSY     MULTIPLE TOOTH EXTRACTIONS     all teeth extracted - does not wear dentures   SHOULDER ARTHROSCOPY WITH SUBACROMIAL DECOMPRESSION, ROTATOR CUFF REPAIR AND BICEP TENDON REPAIR Left 05/29/2023   Procedure: LEFT SHOULDER ARTHROSCOPY, DEBRIDEMENT, MINI OPEN BICEPS TENODESIS AND ROTATOR CUFF TEAR REPAIR;  Surgeon: Cammy Copa, MD;  Location: [facility] OR;  Service: Orthopedics;  Laterality: Left;   TONSILLECTOMY     TUBAL LIGATION     WISDOM TOOTH EXTRACTION     Social History   Occupational History   Occupation: unemployed  Tobacco Use   Smoking status: Every Day    Current packs/day: 1.00    Average packs/day: 1 pack/day for 43.9 years (43.9 ttl pk-yrs)    Types: Cigarettes    Start date: 33   Smokeless tobacco: Never   Tobacco comments:    Smokes 1 pack a day ARJ 01/20/21 - verified 05/22/23  Vaping Use   Vaping status: Never Used  Substance and Sexual Activity   Alcohol use: No   Drug use: Yes    Types: Marijuana    Comment: occasionally - last use was in 01/2023   Sexual activity: Not Currently    Birth control/protection: None

## 2023-06-14 NOTE — Discharge Summary (Signed)
Physician Discharge Summary      Patient ID: Katie Sherman MRN: [id number] DOB/AGE: 1966-02-17 57 y.o.  Admit date: 05/29/2023 Discharge date: 05/30/2023  Admission Diagnoses:  Principal Problem:   S/P rotator cuff repair Active Problems:   Carpal tunnel syndrome, left upper limb   Synovitis of left shoulder   Degenerative superior labral anterior-to-posterior (SLAP) tear of left shoulder   Tendonitis of upper biceps tendon of left shoulder   Complete tear of left rotator cuff   Discharge Diagnoses:  Same  Surgeries: Procedure(s): LEFT SHOULDER ARTHROSCOPY, DEBRIDEMENT, MINI OPEN BICEPS TENODESIS AND ROTATOR CUFF TEAR REPAIR LEFT CARPAL TUNNEL RELEASE on 05/29/2023   Consultants:   Discharged Condition: Stable  Hospital Course: Katie Sherman is an 57 y.o. female who was admitted 05/29/2023 with a chief complaint of left shoulder pain and left hand pain, and found to have a diagnosis of left rotator cuff tear, left carpal tunnel syndrome.  They were brought to the operating room on 05/29/2023 and underwent the above named procedures.  Pt awoke from anesthesia without complication and was transferred to the floor. On POD1, patient left AMA and was not able to be evaluated.  She was called later in the day and had discharge/postop instructions sent to her on MyChart.    Antibiotics given:  Anti-infectives (From admission, onward)    Start     Dose/Rate Route Frequency Ordered Stop   05/29/23 2200  ceFAZolin (ANCEF) IVPB 2g/100 mL premix  Status:  Discontinued        2 g 200 mL/hr over 30 Minutes Intravenous Every 8 hours 05/29/23 1736 05/30/23 0141   05/29/23 0918  ceFAZolin (ANCEF) 2-4 GM/100ML-% IVPB       Note to Pharmacy: Kathrene Bongo D: cabinet override      05/29/23 0918 05/29/23 1219   05/29/23 0915  ceFAZolin (ANCEF) IVPB 2g/100 mL premix        2 g 200 mL/hr over 30 Minutes Intravenous On call to O.R. 05/29/23 0906 05/29/23 1210     .  Recent vital  signs:  Vitals:   05/29/23 1939 05/29/23 2037  BP: 91/68 114/71  Pulse: 81 86  Resp: 18 18  Temp: 98.1 F (36.7 C) 98.4 F (36.9 C)  SpO2: 100% 98%    Recent laboratory studies:  Results for orders placed or performed during the hospital encounter of 05/22/23  CBC  Result Value Ref Range   WBC 7.3 4.0 - 10.5 K/uL   RBC 4.43 3.87 - 5.11 MIL/uL   Hemoglobin 13.9 12.0 - 15.0 g/dL   HCT 52.8 41.3 - 24.4 %   MCV 94.1 80.0 - 100.0 fL   MCH 31.4 26.0 - 34.0 pg   MCHC 33.3 30.0 - 36.0 g/dL   RDW 01.0 27.2 - 53.6 %   Platelets 396 150 - 400 K/uL   nRBC 0.0 0.0 - 0.2 %  Basic metabolic panel  Result Value Ref Range   Sodium 138 135 - 145 mmol/L   Potassium 4.1 3.5 - 5.1 mmol/L   Chloride 105 98 - 111 mmol/L   CO2 25 22 - 32 mmol/L   Glucose, Bld 86 70 - 99 mg/dL   BUN 9 6 - 20 mg/dL   Creatinine, Ser 6.44 (H) 0.44 - 1.00 mg/dL   Calcium 9.1 8.9 - 03.4 mg/dL   GFR, Estimated 55 (L) >60 mL/min   Anion gap 8 5 - 15    Discharge Medications:   Allergies as of 05/29/2023  No Known Allergies      Medication List     ASK your doctor about these medications    albuterol 108 (90 Base) MCG/ACT inhaler Commonly known as: VENTOLIN HFA Inhale 2 puffs into the lungs every 6 (six) hours as needed for wheezing or shortness of breath.   ibuprofen 600 MG tablet Commonly known as: ADVIL Take 1 tablet (600 mg total) by mouth every 8 (eight) hours as needed.   naloxone 4 MG/0.1ML Liqd nasal spray kit Commonly known as: NARCAN Use in case of accidental overdose   nitroGLYCERIN 0.2 mg/hr patch Commonly known as: NITRODUR - Dosed in mg/24 hr Apply 1/4 patch daily to the affected area   oxyCODONE-acetaminophen 10-325 MG tablet Commonly known as: PERCOCET Take 1 tablet by mouth every 6 (six) hours as needed for moderate pain   oxyCODONE-acetaminophen 10-325 MG tablet Commonly known as: PERCOCET Take 1 tablet by mouth every 6 (six) hours as needed for moderate pain    oxyCODONE-acetaminophen 10-325 MG tablet Commonly known as: PERCOCET Take 1 tablet by mouth every 6 (six) hours as needed for moderate pain.   oxyCODONE-acetaminophen 10-325 MG tablet Commonly known as: PERCOCET Take 1 tablet by mouth every 6 (six) hours as needed for moderate pain.   oxyCODONE-acetaminophen 10-325 MG tablet Commonly known as: PERCOCET Take 1 tablet by mouth every 6 (six) hours as needed for mild to moderate pain.   oxyCODONE-acetaminophen 10-325 MG tablet Commonly known as: PERCOCET Take 1 tablet by mouth every 4 to 6 hours as needed for post-operative pain.   QUEtiapine 100 MG tablet Commonly known as: SEROQUEL Take 1 tablet (100 mg total) by mouth at bedtime.   traZODone 50 MG tablet Commonly known as: DESYREL Take 1 tablet (50 mg total) by mouth at bedtime as needed for sleep.   Vitamin D (Ergocalciferol) 1.25 MG (50000 UNIT) Caps capsule Commonly known as: DRISDOL Take 1 capsule (50,000 Units total) by mouth every 7 (seven) days.        Diagnostic Studies: No results found.  Disposition:        Signed: Karenann Cai 06/14/2023, 10:13 AM

## 2023-06-19 ENCOUNTER — Ambulatory Visit: Payer: Medicaid Other | Admitting: Orthopedic Surgery

## 2023-06-25 ENCOUNTER — Encounter (HOSPITAL_COMMUNITY): Payer: Self-pay | Admitting: Psychiatry

## 2023-06-25 ENCOUNTER — Other Ambulatory Visit (HOSPITAL_COMMUNITY): Payer: Self-pay

## 2023-06-25 ENCOUNTER — Telehealth (HOSPITAL_COMMUNITY): Payer: Medicaid Other | Admitting: Psychiatry

## 2023-06-25 VITALS — Wt 188.0 lb

## 2023-06-25 DIAGNOSIS — F4310 Post-traumatic stress disorder, unspecified: Secondary | ICD-10-CM

## 2023-06-25 DIAGNOSIS — F319 Bipolar disorder, unspecified: Secondary | ICD-10-CM | POA: Diagnosis not present

## 2023-06-25 MED ORDER — LAMOTRIGINE 100 MG PO TABS
300.0000 mg | ORAL_TABLET | Freq: Every day | ORAL | 2 refills | Status: DC
  Filled 2023-06-25 – 2023-07-19 (×2): qty 90, 30d supply, fill #0
  Filled 2023-08-26: qty 90, 30d supply, fill #1
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
## 2023-06-26 ENCOUNTER — Other Ambulatory Visit (HOSPITAL_COMMUNITY): Payer: Self-pay

## 2023-06-26 DIAGNOSIS — Z5181 Encounter for therapeutic drug level monitoring: Secondary | ICD-10-CM | POA: Diagnosis not present

## 2023-06-26 DIAGNOSIS — M1990 Unspecified osteoarthritis, unspecified site: Secondary | ICD-10-CM | POA: Diagnosis not present

## 2023-06-26 DIAGNOSIS — G894 Chronic pain syndrome: Secondary | ICD-10-CM | POA: Diagnosis not present

## 2023-06-26 DIAGNOSIS — M75102 Unspecified rotator cuff tear or rupture of left shoulder, not specified as traumatic: Secondary | ICD-10-CM | POA: Diagnosis not present

## 2023-06-26 DIAGNOSIS — Z79891 Long term (current) use of opiate analgesic: Secondary | ICD-10-CM | POA: Diagnosis not present

## 2023-06-26 MED ORDER — OXYCODONE-ACETAMINOPHEN 10-325 MG PO TABS
1.0000 | ORAL_TABLET | ORAL | 0 refills | Status: DC | PRN
  Filled 2023-06-26: qty 150, 25d supply, fill #0

## 2023-06-27 ENCOUNTER — Other Ambulatory Visit (HOSPITAL_COMMUNITY): Payer: Self-pay

## 2023-06-27 ENCOUNTER — Ambulatory Visit: Payer: Medicaid Other | Attending: Surgical

## 2023-07-02 ENCOUNTER — Encounter: Payer: Medicaid Other | Admitting: Orthopedic Surgery

## 2023-07-02 ENCOUNTER — Other Ambulatory Visit (HOSPITAL_COMMUNITY): Payer: Self-pay

## 2023-07-05 ENCOUNTER — Other Ambulatory Visit (HOSPITAL_COMMUNITY): Payer: Self-pay

## 2023-07-05 ENCOUNTER — Other Ambulatory Visit: Payer: Self-pay | Admitting: Critical Care Medicine

## 2023-07-05 DIAGNOSIS — U071 COVID-19: Secondary | ICD-10-CM

## 2023-07-06 ENCOUNTER — Other Ambulatory Visit (HOSPITAL_COMMUNITY): Payer: Self-pay

## 2023-07-06 MED ORDER — ALBUTEROL SULFATE HFA 108 (90 BASE) MCG/ACT IN AERS
2.0000 | INHALATION_SPRAY | Freq: Four times a day (QID) | RESPIRATORY_TRACT | 2 refills | Status: AC | PRN
  Filled 2023-07-06: qty 18, 25d supply, fill #0
  Filled 2023-07-19: qty 18, 30d supply, fill #0
  Filled 2024-04-10: qty 18, 30d supply, fill #1

## 2023-07-12 ENCOUNTER — Ambulatory Visit: Payer: Medicaid Other | Admitting: Orthopedic Surgery

## 2023-07-12 ENCOUNTER — Encounter: Payer: Self-pay | Admitting: Orthopedic Surgery

## 2023-07-12 DIAGNOSIS — I872 Venous insufficiency (chronic) (peripheral): Secondary | ICD-10-CM

## 2023-07-12 NOTE — Progress Notes (Signed)
 Office Visit Note   Patient: Katie Sherman           Date of Birth: 11/10/1965           MRN: [id number] Visit Date: 07/12/2023              Requested by: Brien Belvie BRAVO, MD [address] [place],  [place] [address] PCP: Tanda Bleacher, MD  Chief Complaint  Patient presents with   Right Ankle - Edema   Left Ankle - Edema      HPI: Patient is a 58 year old woman who is seen for evaluation for venous swelling bilateral lower extremities.  Patient states she has been having episodes of increasing swelling in both lower extremities and sometimes this resolves.  Patient states she sits at her desk essentially all day long with her feet dependent.  Patient states that she will proceed to obtain compression socks today.  Assessment & Plan: Visit Diagnoses:  1. Venous stasis dermatitis of both lower extremities     Plan: Recommended elevation, compression, exercise.  Follow-Up Instructions: Return in about 2 months (around 09/09/2023).   Ortho Exam  Patient is alert, oriented, no adenopathy, well-dressed, normal affect, normal respiratory effort. Examination patient has a palpable dorsalis pedis pulse bilaterally.  She has pitting edema both lower extremities without ulcer or cellulitis.  There is no instability of her ankle.  Imaging: No results found. No images are attached to the encounter.  Labs: Lab Results  Component Value Date   HGBA1C 5.8 (H) 08/29/2022   HGBA1C 5.7 (H) 02/07/2022   HGBA1C 5.3 01/01/2020   ESRSEDRATE 12 09/05/2021   CRP 5 09/05/2021     Lab Results  Component Value Date   ALBUMIN 4.6 08/29/2022   ALBUMIN 4.5 02/07/2022   ALBUMIN 4.4 09/05/2021    No results found for: MG Lab Results  Component Value Date   VD25OH 33.7 02/18/2019   VD25OH 28.8 (L) 01/24/2017    No results found for: PREALBUMIN    Latest Ref Rng & Units 05/22/2023   12:09 PM 02/01/2023    4:00 PM 08/29/2022    2:02 PM  CBC EXTENDED  WBC 4.0 -  10.5 K/uL 7.3  7.0  7.4   RBC 3.87 - 5.11 MIL/uL 4.43  3.94  4.34   Hemoglobin 12.0 - 15.0 g/dL 86.0  88.0  86.0   HCT 36.0 - 46.0 % 41.7  36.9  41.4   Platelets 150 - 400 K/uL 396  347  382   NEUT# 1.4 - 7.0 x10E3/uL   3.0   Lymph# 0.7 - 3.1 x10E3/uL   3.7      There is no height or weight on file to calculate BMI.  Orders:  No orders of the defined types were placed in this encounter.  No orders of the defined types were placed in this encounter.    Procedures: No procedures performed  Clinical Data: No additional findings.  ROS:  All other systems negative, except as noted in the HPI. Review of Systems  Objective: Vital Signs: LMP 04/29/2012   Specialty Comments:  No specialty comments available.  PMFS History: Patient Active Problem List   Diagnosis Date Noted   Synovitis of left shoulder 06/03/2023   Degenerative superior labral anterior-to-posterior (SLAP) tear of left shoulder 06/03/2023   Tendonitis of upper biceps tendon of left shoulder 06/03/2023   Complete tear of left rotator cuff 06/03/2023   S/P rotator cuff repair 05/29/2023   Homelessness  02/07/2022   Chronic pain syndrome 02/07/2022   Costochondritis 09/05/2021   Tobacco dependence 08/07/2020   Lung nodules 02/16/2020   Vitamin D  deficiency 02/19/2019   Carpal tunnel syndrome, left upper limb 03/19/2018   Mixed hyperlipidemia 11/14/2017   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   Postmenopausal 01/22/2017   Bipolar 1 disorder, depressed (HCC) 12/29/2016   Family history of suicide 11/20/2016   Depression 11/20/2016   Family history of breast cancer 11/20/2016   Adhesive capsulitis of left shoulder 11/19/2016   Greater trochanteric bursitis of right hip 11/19/2016   Past Medical History:  Diagnosis Date   Anxiety 06/01/2021   Arthritis    hands, right knee, left shoulder   Bilateral carpal tunnel syndrome 03/19/2018   Bipolar disorder (HCC)    bipolar 1 disorder   Cervical  cancer (HCC)    COPD (chronic obstructive pulmonary disease) (HCC)    emphysema mild per patient   Depression    Dyspnea    with exertion   GERD (gastroesophageal reflux disease)    Hemoptysis 01/11/2021   History of exercise stress test    ETT-Echo 5/18: normal EF, no ischemia   History of hiatal hernia    no current problems as of 05/22/23 per patient   History of kidney stones    passed stones, no surgery   History of loop recorder 2018   Patient does not have Loop Recorder.  In 2018, the patient wore a heart monitor for 30 days for a low heart rate - study was normal per patient.   HSV-2 (herpes simplex virus 2) infection    tx with valtrex    Pharyngeal dysphagia 01/11/2021   Pneumonia    x 1   PTSD (post-traumatic stress disorder) 09/25/2022    Family History  Problem Relation Age of Onset   Heart attack Mother 93   Heart disease Father    Heart attack Father 83   Liver cancer Father    Bone cancer Other    Breast cancer Maternal Aunt        Diagnosed in 31's   Bone cancer Maternal Aunt     Past Surgical History:  Procedure Laterality Date   CARPAL TUNNEL RELEASE Left 05/29/2023   Procedure: LEFT CARPAL TUNNEL RELEASE;  Surgeon: Addie Cordella Hamilton, MD;  Location: [facility] OR;  Service: Orthopedics;  Laterality: Left;   CERVICAL CONE BIOPSY     MULTIPLE TOOTH EXTRACTIONS     all teeth extracted - does not wear dentures   SHOULDER ARTHROSCOPY WITH SUBACROMIAL DECOMPRESSION, ROTATOR CUFF REPAIR AND BICEP TENDON REPAIR Left 05/29/2023   Procedure: LEFT SHOULDER ARTHROSCOPY, DEBRIDEMENT, MINI OPEN BICEPS TENODESIS AND ROTATOR CUFF TEAR REPAIR;  Surgeon: Addie Cordella Hamilton, MD;  Location: [facility] OR;  Service: Orthopedics;  Laterality: Left;   TONSILLECTOMY     TUBAL LIGATION     WISDOM TOOTH EXTRACTION     Social History   Occupational History   Occupation: unemployed  Tobacco Use   Smoking status: Every Day    Current packs/day: 1.00    Average packs/day: 1 pack/day for  44.0 years (44.0 ttl pk-yrs)    Types: Cigarettes    Start date: 97   Smokeless tobacco: Never   Tobacco comments:    Smokes 1 pack a day ARJ 01/20/21 - verified 05/22/23  Vaping Use   Vaping status: Never Used  Substance and Sexual Activity   Alcohol use: No   Drug use: Yes    Types: Marijuana  Comment: occasionally - last use was in 01/2023   Sexual activity: Not Currently    Birth control/protection: None

## 2023-07-13 ENCOUNTER — Encounter: Payer: Medicaid Other | Admitting: Surgical

## 2023-07-16 ENCOUNTER — Other Ambulatory Visit (HOSPITAL_COMMUNITY): Payer: Self-pay

## 2023-07-19 ENCOUNTER — Other Ambulatory Visit (HOSPITAL_COMMUNITY): Payer: Self-pay

## 2023-07-19 ENCOUNTER — Other Ambulatory Visit: Payer: Self-pay

## 2023-07-24 ENCOUNTER — Other Ambulatory Visit (HOSPITAL_COMMUNITY): Payer: Self-pay

## 2023-07-24 DIAGNOSIS — M75112 Incomplete rotator cuff tear or rupture of left shoulder, not specified as traumatic: Secondary | ICD-10-CM | POA: Diagnosis not present

## 2023-07-24 DIAGNOSIS — Z79891 Long term (current) use of opiate analgesic: Secondary | ICD-10-CM | POA: Diagnosis not present

## 2023-07-24 DIAGNOSIS — M75102 Unspecified rotator cuff tear or rupture of left shoulder, not specified as traumatic: Secondary | ICD-10-CM | POA: Diagnosis not present

## 2023-07-24 DIAGNOSIS — G894 Chronic pain syndrome: Secondary | ICD-10-CM | POA: Diagnosis not present

## 2023-07-24 DIAGNOSIS — M1990 Unspecified osteoarthritis, unspecified site: Secondary | ICD-10-CM | POA: Diagnosis not present

## 2023-07-24 MED ORDER — OXYCODONE-ACETAMINOPHEN 10-325 MG PO TABS
1.0000 | ORAL_TABLET | Freq: Four times a day (QID) | ORAL | 0 refills | Status: AC | PRN
  Filled 2023-07-24: qty 120, 30d supply, fill #0

## 2023-07-25 ENCOUNTER — Encounter: Payer: Medicaid Other | Admitting: Surgical

## 2023-07-30 ENCOUNTER — Other Ambulatory Visit: Payer: Self-pay

## 2023-07-30 ENCOUNTER — Ambulatory Visit: Payer: Medicaid Other | Attending: Surgical | Admitting: Physical Therapy

## 2023-07-30 ENCOUNTER — Encounter: Payer: Self-pay | Admitting: Physical Therapy

## 2023-07-30 DIAGNOSIS — M75112 Incomplete rotator cuff tear or rupture of left shoulder, not specified as traumatic: Secondary | ICD-10-CM | POA: Diagnosis present

## 2023-07-30 NOTE — Therapy (Signed)
OUTPATIENT PHYSICAL THERAPY SHOULDER EVALUATION   Patient Name: Katie Sherman MRN: [id number] DOB:[birth year], 58 y.o., female Today's Date: 07/30/2023  END OF SESSION:  PT End of Session - 07/30/23 1203     Visit Number 1    Number of Visits 13    Date for PT Re-Evaluation 09/10/23    PT Start Time 1110    PT Stop Time 1200    PT Time Calculation (min) 50 min    Activity Tolerance Patient limited by pain    Behavior During Therapy [facility] for tasks assessed/performed             Past Medical History:  Diagnosis Date   Anxiety 06/01/2021   Arthritis    hands, right knee, left shoulder   Bilateral carpal tunnel syndrome 03/19/2018   Bipolar disorder (HCC)    bipolar 1 disorder   Cervical cancer (HCC)    COPD (chronic obstructive pulmonary disease) (HCC)    emphysema mild per patient   Depression    Dyspnea    with exertion   GERD (gastroesophageal reflux disease)    Hemoptysis 01/11/2021   History of exercise stress test    ETT-Echo 5/18: normal EF, no ischemia   History of hiatal hernia    no current problems as of 05/22/23 per patient   History of kidney stones    passed stones, no surgery   History of loop recorder 2018   Patient does not have Loop Recorder.  In 2018, the patient wore a heart monitor for 30 days for a low heart rate - study was normal per patient.   HSV-2 (herpes simplex virus 2) infection    tx with valtrex   Pharyngeal dysphagia 01/11/2021   Pneumonia    x 1   PTSD (post-traumatic stress disorder) 09/25/2022   Past Surgical History:  Procedure Laterality Date   CARPAL TUNNEL RELEASE Left 05/29/2023   Procedure: LEFT CARPAL TUNNEL RELEASE;  Surgeon: Cammy Copa, MD;  Location: [facility] OR;  Service: Orthopedics;  Laterality: Left;   CERVICAL CONE BIOPSY     MULTIPLE TOOTH EXTRACTIONS     all teeth extracted - does not wear dentures   SHOULDER ARTHROSCOPY WITH SUBACROMIAL DECOMPRESSION, ROTATOR CUFF REPAIR AND BICEP TENDON REPAIR Left  05/29/2023   Procedure: LEFT SHOULDER ARTHROSCOPY, DEBRIDEMENT, MINI OPEN BICEPS TENODESIS AND ROTATOR CUFF TEAR REPAIR;  Surgeon: Cammy Copa, MD;  Location: [facility] OR;  Service: Orthopedics;  Laterality: Left;   TONSILLECTOMY     TUBAL LIGATION     WISDOM TOOTH EXTRACTION     Patient Active Problem List   Diagnosis Date Noted   Synovitis of left shoulder 06/03/2023   Degenerative superior labral anterior-to-posterior (SLAP) tear of left shoulder 06/03/2023   Tendonitis of upper biceps tendon of left shoulder 06/03/2023   Complete tear of left rotator cuff 06/03/2023   S/P rotator cuff repair 05/29/2023   Homelessness 02/07/2022   Chronic pain syndrome 02/07/2022   Costochondritis 09/05/2021   Tobacco dependence 08/07/2020   Lung nodules 02/16/2020   Vitamin D deficiency 02/19/2019   Carpal tunnel syndrome, left upper limb 03/19/2018   Mixed hyperlipidemia 11/14/2017   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   Postmenopausal 01/22/2017   Bipolar 1 disorder, depressed (HCC) 12/29/2016   Family history of suicide 11/20/2016   Depression 11/20/2016   Family history of breast cancer 11/20/2016   Adhesive capsulitis of left shoulder 11/19/2016   Greater trochanteric bursitis of right hip 11/19/2016  PCP: Georganna Skeans, MD   REFERRING PROVIDER: Julieanne Cotton, PA-C  REFERRING DIAG:  Incomplete rotator cuff tear or rupture of left shoulder, not specified as traumatic [M75.112]  THERAPY DIAG:  Incomplete rotator cuff tear or rupture of left shoulder, not specified as traumatic  Rationale for Evaluation and Treatment: Rehabilitation  ONSET DATE:  left shoulder rotator cuff repair, biceps tenodesis, and carpal tunnel release on 05/29/2023   SUBJECTIVE:                                                                                                                                                                                      SUBJECTIVE STATEMENT: Pt  states that she was out of town and has had multiple schedule mishaps, being the main reason she hasn't been able to follow up with surgical team. Pt states that she currently has her neighbor or roommate assist with ADLs such has grooming or putting up her hair and lifting things. Hand dominance: Right  PERTINENT HISTORY: left shoulder rotator cuff repair, biceps tenodesis, and carpal tunnel release on 05/29/2023, Bipolar disorder  PAIN:  Are you having pain? Yes: NPRS scale: Y Pain location: Anterior L shoulder Pain description: sharp Aggravating factors: any movement, laying supine Relieving factors: seated rest  PRECAUTIONS: Plan is start physical therapy in 2 weeks at [facility] PT.  At that point (1 month out) she will be okay for passive range of motion and active assisted range of motion but no full active range of motion or any rotator cuff strengthening exercises until she is 6 weeks postop.  Again I discouraged [name] from any active range of motion or lifting with the operative extremity.  She understands and agrees with plan.  Follow-up in 3 weeks for clinical recheck with Dr. August Saucer.  RED FLAGS: None   WEIGHT BEARING RESTRICTIONS: No  FALLS:  Has patient fallen in last 6 months? 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  PATIENT SURVEYS:  FOTO current 52% (anticipated 65%)  COGNITION: Overall cognitive status: Within functional limits for tasks assessed     SENSATION: WFL  POSTURE: Rounded shoulders  UPPER EXTREMITY ROM:   Passive ROM Right eval Left eval  Shoulder flexion  40  Shoulder extension  40  Shoulder abduction  40  Shoulder adduction     Shoulder internal rotation  70  Shoulder external rotation  0  Elbow flexion  130  Elbow extension  0  Wrist flexion    Wrist extension    Wrist ulnar deviation    Wrist radial deviation    Wrist pronation    Wrist supination    (Blank rows = not tested)  UPPER EXTREMITY MMT: Unable to test at eval d/t pain  MMT Right eval Left eval  Shoulder flexion    Shoulder extension    Shoulder abduction    Shoulder adduction    Shoulder internal rotation    Shoulder external rotation    Middle trapezius    Lower trapezius    Elbow flexion    Elbow extension    Wrist flexion    Wrist extension    Wrist ulnar deviation    Wrist radial deviation    Wrist pronation    Wrist supination    Grip strength (lbs)    (Blank rows = not tested)  SHOULDER SPECIAL TESTS: Not indicated  JOINT MOBILITY TESTING:  Not indicated  PALPATION:  TTP over bicipital groove and lesser tubercle of humeral head                                                                                                                             [facility] Adult PT Treatment:                                                DATE: 07/30/2023 Therapeutic Activity: Reviewed HEP and application to daily function    PATIENT EDUCATION: Education details: Pt received education regarding HEP performance, ADL performance, functional activity tolerance, impairment education, appropriate performance of therapeutic activities. Person educated: Patient Education method: Explanation, Demonstration, Tactile cues, and Verbal cues Education comprehension: verbalized understanding, returned demonstration, and verbal cues required  HOME EXERCISE PROGRAM: Access Code: [email or web address] URL: [email or web address] Date: 07/30/2023 Prepared by: Sheliah Plane  Exercises - Seated Shoulder Flexion Towel Slide at Table Top  - 1 x daily - 7 x weekly - 3 sets - 8 reps - 5s hold - Isometric Shoulder Extension at Wall  - 1 x daily -  7 x weekly - 3 sets - 8 reps - 5s hold - Isometric Shoulder Abduction at Wall  - 1 x daily - 7 x weekly - 3 sets - 8 reps - 5s hold - Isometric Shoulder External Rotation at Wall  - 1 x daily - 7 x weekly - 3 sets - 8 reps - 5s hold  Could not print d/t technical issues in the clinic, print next visit  ASSESSMENT:  CLINICAL IMPRESSION: Eval impression (07/30/2023) Pt. attended today's physical therapy session for evaluation of L shoulder rotator cuff repair, bicep tendonesis, and carpal tunnel release. Pt is currently 9 weeks s/p and is significantly behind normal rehabilitative schedule. Pt has complaints of high levels of  pain, weakness and overall reduction of daily function . Pt has notable deficits with pain, mobility and scapular stability. Pt would benefit from initial therapeutic focus on increasing ROM and stabilizing shoulder throughout available ROM. Pt was educated on need to follow up with surgical team. Pt demonstrated good understanding of education provided and required moderate vocal/tactile cues for appropriate performance with today's activities.    OBJECTIVE IMPAIRMENTS: decreased coordination, decreased mobility, decreased ROM, decreased strength, increased edema, increased fascial restrictions, impaired flexibility, impaired UE functional use, improper body mechanics, and pain.   ACTIVITY LIMITATIONS: carrying, lifting, sleeping, bathing, toileting, dressing, self feeding, reach over head, hygiene/grooming, and caring for others  PARTICIPATION LIMITATIONS: meal prep, cleaning, laundry, interpersonal relationship, driving, shopping, occupation, and yard work  PERSONAL FACTORS: Age, Behavior pattern, Fitness, and Time since onset of injury/illness/exacerbation are also affecting patient's functional outcome.   REHAB POTENTIAL: Fair see assessment  CLINICAL DECISION MAKING: Evolving/moderate complexity  EVALUATION COMPLEXITY: Moderate   GOALS: Goals reviewed with  patient? Yes  SHORT TERM GOALS: Target date: 08/20/2023   Pt will be independent with administered HEP to demonstrate the competency necessary for long term managemnet of symptoms at home. Baseline: Goal status: INITIAL  2.  Pt will improve Passive ROM to 90 degrees of L shoulder flexion to allow for more functional modified use during ADLs such as washing hair Baseline: 40 deg Goal status: INITIAL   LONG TERM GOALS: Target date: 09/10/2023  Pt. Will achieve a FOTO score of 65% as to demonstrate improvement in self-perceived functional ability with daily activities. Baseline: 52% Goal status: INITIAL  2.  Pt will improve gross L shoulder strength to 4/5 to demonstrate improvement in strength for quality of motion and activity performance.  Baseline:  Goal status: INITIAL  3.  Pt will improve global AROM of L shoulder to 80% of standardized norms as to demonstrate necessary motion for functional use during ADLs  Baseline:  Goal status: INITIAL    PLAN:  PT FREQUENCY: 2x/week  PT DURATION: 6 weeks  PLANNED INTERVENTIONS: 97110-Therapeutic exercises, 97530- Therapeutic activity, 97112- Neuromuscular re-education, 97535- Self Care, 69629- Manual therapy, 97760- Splinting, Taping, Joint mobilization, and Scar mobilization  PLAN FOR NEXT SESSION: review and print HEP, passive/active assisted ROM work, scapular stabilization, rotator cuff strengthening, See precautions   Sheliah Plane, PT, DPT 07/30/2023, 12:09 PM   Date of referral: 06/13/2023 Referring provider: Julieanne Cotton, PA-C  Referring diagnosis? incomplete rotator cuff tear or rupture of left shoulder, not specified as traumatic [M75.112]  Treatment diagnosis? (if different than referring diagnosis) Same  What was this (referring dx) caused by? Surgery (Type: see above)  Nature of Condition: Initial Onset (within last 3 months)   Laterality: Lt  Current Functional Measure Score: FOTO 52%  Objective  measurements identify impairments when they are compared to normal values, the uninvolved extremity, and prior level of function.  [x]  Yes  []  No  Objective assessment of functional ability: Severe functional limitations   Briefly describe symptoms: see assessment  How did symptoms start: surgery  Average pain intensity:  Last 24 hours: 8/10  Past week: 10/10  How often does the pt experience symptoms? Frequently  How much have the symptoms interfered with usual daily activities? Quite a bit  How has condition changed since care began at this facility? NA - initial visit  In general, how is the patients overall health? Good   BACK PAIN (STarT Back Screening Tool) No

## 2023-08-01 NOTE — Therapy (Deleted)
OUTPATIENT PHYSICAL THERAPY SHOULDER EVALUATION   Patient Name: Katie Sherman MRN: [id number] DOB:[birth year], 58 y.o., female Today's Date: 08/01/2023  END OF SESSION:    Past Medical History:  Diagnosis Date   Anxiety 06/01/2021   Arthritis    hands, right knee, left shoulder   Bilateral carpal tunnel syndrome 03/19/2018   Bipolar disorder (HCC)    bipolar 1 disorder   Cervical cancer (HCC)    COPD (chronic obstructive pulmonary disease) (HCC)    emphysema mild per patient   Depression    Dyspnea    with exertion   GERD (gastroesophageal reflux disease)    Hemoptysis 01/11/2021   History of exercise stress test    ETT-Echo 5/18: normal EF, no ischemia   History of hiatal hernia    no current problems as of 05/22/23 per patient   History of kidney stones    passed stones, no surgery   History of loop recorder 2018   Patient does not have Loop Recorder.  In 2018, the patient wore a heart monitor for 30 days for a low heart rate - study was normal per patient.   HSV-2 (herpes simplex virus 2) infection    tx with valtrex   Pharyngeal dysphagia 01/11/2021   Pneumonia    x 1   PTSD (post-traumatic stress disorder) 09/25/2022   Past Surgical History:  Procedure Laterality Date   CARPAL TUNNEL RELEASE Left 05/29/2023   Procedure: LEFT CARPAL TUNNEL RELEASE;  Surgeon: Katie Copa, MD;  Location: [facility] OR;  Service: Orthopedics;  Laterality: Left;   CERVICAL CONE BIOPSY     MULTIPLE TOOTH EXTRACTIONS     all teeth extracted - does not wear dentures   SHOULDER ARTHROSCOPY WITH SUBACROMIAL DECOMPRESSION, ROTATOR CUFF REPAIR AND BICEP TENDON REPAIR Left 05/29/2023   Procedure: LEFT SHOULDER ARTHROSCOPY, DEBRIDEMENT, MINI OPEN BICEPS TENODESIS AND ROTATOR CUFF TEAR REPAIR;  Surgeon: Katie Copa, MD;  Location: [facility] OR;  Service: Orthopedics;  Laterality: Left;   TONSILLECTOMY     TUBAL LIGATION     WISDOM TOOTH EXTRACTION     Patient Active Problem List    Diagnosis Date Noted   Synovitis of left shoulder 06/03/2023   Degenerative superior labral anterior-to-posterior (SLAP) tear of left shoulder 06/03/2023   Tendonitis of upper biceps tendon of left shoulder 06/03/2023   Complete tear of left rotator cuff 06/03/2023   S/P rotator cuff repair 05/29/2023   Homelessness 02/07/2022   Chronic pain syndrome 02/07/2022   Costochondritis 09/05/2021   Tobacco dependence 08/07/2020   Lung nodules 02/16/2020   Vitamin D deficiency 02/19/2019   Carpal tunnel syndrome, left upper limb 03/19/2018   Mixed hyperlipidemia 11/14/2017   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   Postmenopausal 01/22/2017   Bipolar 1 disorder, depressed (HCC) 12/29/2016   Family history of suicide 11/20/2016   Depression 11/20/2016   Family history of breast cancer 11/20/2016   Adhesive capsulitis of left shoulder 11/19/2016   Greater trochanteric bursitis of right hip 11/19/2016    PCP: Katie Skeans, MD   REFERRING PROVIDER: Julieanne Cotton, PA-C  REFERRING DIAG:  Incomplete rotator cuff tear or rupture of left shoulder, not specified as traumatic [M75.112]  THERAPY DIAG:  No diagnosis found.  Rationale for Evaluation and Treatment: Rehabilitation  ONSET DATE:  left shoulder rotator cuff repair, biceps tenodesis, and carpal tunnel release on 05/29/2023   SUBJECTIVE:  SUBJECTIVE STATEMENT: Pt states that she was out of town and has had multiple schedule mishaps, being the main reason she hasn't been able to follow up with surgical team. Pt states that she currently has her neighbor or roommate assist with ADLs such has grooming or putting up her hair and lifting things. Hand dominance: Right  PERTINENT HISTORY: left shoulder rotator cuff repair, biceps tenodesis, and  carpal tunnel release on 05/29/2023, Bipolar disorder  PAIN:  Are you having pain? Yes: NPRS scale: Y Pain location: Anterior L shoulder Pain description: sharp Aggravating factors: any movement, laying supine Relieving factors: seated rest  PRECAUTIONS: Plan is start physical therapy in 2 weeks at [facility].  At that point (1 month out) she will be okay for passive range of motion and active assisted range of motion but no full active range of motion or any rotator cuff strengthening exercises until she is 6 weeks postop.  Again I discouraged Katie Sherman from any active range of motion or lifting with the operative extremity.  She understands and agrees with plan.  Follow-up in 3 weeks for clinical recheck with Dr. August Sherman.  RED FLAGS: None   WEIGHT BEARING RESTRICTIONS: No  FALLS:  Has patient fallen in last 6 months? 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  PATIENT SURVEYS:  FOTO current 52% (anticipated 65%)  COGNITION: Overall cognitive status: Within functional limits for tasks assessed     SENSATION: WFL  POSTURE: Rounded shoulders  UPPER EXTREMITY ROM:   Passive ROM Right eval Left eval  Shoulder flexion  40  Shoulder extension  40  Shoulder abduction  40  Shoulder adduction    Shoulder internal rotation  70  Shoulder external rotation  0  Elbow flexion  130  Elbow extension  0  Wrist flexion    Wrist extension    Wrist ulnar deviation    Wrist radial deviation    Wrist pronation    Wrist supination    (Blank rows = not tested)  UPPER EXTREMITY MMT: Unable to test at eval d/t pain  MMT Right eval Left eval  Shoulder flexion    Shoulder  extension    Shoulder abduction    Shoulder adduction    Shoulder internal rotation    Shoulder external rotation    Middle trapezius    Lower trapezius    Elbow flexion    Elbow extension    Wrist flexion    Wrist extension    Wrist ulnar deviation    Wrist radial deviation    Wrist pronation    Wrist supination    Grip strength (lbs)    (Blank rows = not tested)  SHOULDER SPECIAL TESTS: Not indicated  JOINT MOBILITY TESTING:  Not indicated  PALPATION:  TTP over bicipital groove and lesser tubercle of humeral head  [facility] Adult PT Treatment:                                                DATE: 07/30/2023 Therapeutic Activity: Reviewed HEP and application to daily function    PATIENT EDUCATION: Education details: Pt received education regarding HEP performance, ADL performance, functional activity tolerance, impairment education, appropriate performance of therapeutic activities. Person educated: Patient Education method: Explanation, Demonstration, Tactile cues, and Verbal cues Education comprehension: verbalized understanding, returned demonstration, and verbal cues required  HOME EXERCISE PROGRAM: Access Code: [email or web address] URL: [email or web address] Date: 07/30/2023 Prepared by: Sheliah Plane  Exercises - Seated Shoulder Flexion Towel Slide at Table Top  - 1 x daily - 7 x weekly - 3 sets - 8 reps - 5s hold - Isometric Shoulder Extension at Wall  - 1 x daily - 7 x weekly - 3 sets - 8 reps - 5s hold - Isometric Shoulder Abduction at Wall  - 1 x daily - 7 x weekly - 3 sets - 8 reps - 5s hold - Isometric Shoulder External Rotation at Wall  - 1 x daily - 7 x weekly - 3 sets - 8 reps - 5s hold  Could not print d/t technical issues in the clinic, print next visit  ASSESSMENT:  CLINICAL IMPRESSION: Eval impression (07/30/2023) Pt.  attended today's physical therapy session for evaluation of L shoulder rotator cuff repair, bicep tendonesis, and carpal tunnel release. Pt is currently 9 weeks s/p and is significantly behind normal rehabilitative schedule. Pt has complaints of high levels of pain, weakness and overall reduction of daily function . Pt has notable deficits with pain, mobility and scapular stability. Pt would benefit from initial therapeutic focus on increasing ROM and stabilizing shoulder throughout available ROM. Pt was educated on need to follow up with surgical team. Pt demonstrated good understanding of education provided and required moderate vocal/tactile cues for appropriate performance with today's activities.    OBJECTIVE IMPAIRMENTS: decreased coordination, decreased mobility, decreased ROM, decreased strength, increased edema, increased fascial restrictions, impaired flexibility, impaired UE functional use, improper body mechanics, and pain.   ACTIVITY LIMITATIONS: carrying, lifting, sleeping, bathing, toileting, dressing, self feeding, reach over head, hygiene/grooming, and caring for others  PARTICIPATION LIMITATIONS: meal prep, cleaning, laundry, interpersonal relationship, driving, shopping, occupation, and yard work  PERSONAL FACTORS: Age, Behavior pattern, Fitness, and Time since onset of injury/illness/exacerbation are also affecting patient's functional outcome.   REHAB POTENTIAL: Fair see assessment  CLINICAL DECISION MAKING: Evolving/moderate complexity  EVALUATION COMPLEXITY: Moderate   GOALS: Goals reviewed with patient? Yes  SHORT TERM GOALS: Target date: 08/20/2023   Pt will be independent with administered HEP to demonstrate the competency necessary for long term managemnet of symptoms at home. Baseline: Goal status: INITIAL  2.  Pt will improve Passive ROM to 90 degrees of L shoulder flexion to allow for more functional modified use during ADLs such as washing hair Baseline: 40  deg Goal status: INITIAL   LONG TERM GOALS: Target date: 09/10/2023  Pt. Will achieve a FOTO score of 65% as to demonstrate improvement in self-perceived functional ability with daily activities. Baseline: 52% Goal status: INITIAL  2.  Pt will improve gross L shoulder strength to 4/5 to demonstrate improvement in strength for quality of motion and activity performance.  Baseline:  Goal status: INITIAL  3.  Pt will improve global AROM of  L shoulder to 80% of standardized norms as to demonstrate necessary motion for functional use during ADLs  Baseline:  Goal status: INITIAL    PLAN:  PT FREQUENCY: 2x/week  PT DURATION: 6 weeks  PLANNED INTERVENTIONS: 97110-Therapeutic exercises, 97530- Therapeutic activity, 97112- Neuromuscular re-education, 97535- Self Care, [id number]- Manual therapy, 97760- Splinting, Taping, Joint mobilization, and Scar mobilization  PLAN FOR NEXT SESSION: review and print HEP, passive/active assisted ROM work, scapular stabilization, rotator cuff strengthening, See precautions   Sheliah Plane, PT, DPT 08/01/2023, 1:32 PM   Date of referral: 06/13/2023 Referring provider: Julieanne Cotton, PA-C  Referring diagnosis? incomplete rotator cuff tear or rupture of left shoulder, not specified as traumatic [M75.112]  Treatment diagnosis? (if different than referring diagnosis) Same  What was this (referring dx) caused by? Surgery (Type: see above)  Nature of Condition: Initial Onset (within last 3 months)   Laterality: Lt  Current Functional Measure Score: FOTO 52%  Objective measurements identify impairments when they are compared to normal values, the uninvolved extremity, and prior level of function.  [x]  Yes  []  No  Objective assessment of functional ability: Severe functional limitations   Briefly describe symptoms: see assessment  How did symptoms start: surgery  Average pain intensity:  Last 24 hours: 8/10  Past week: 10/10  How often  does the pt experience symptoms? Frequently  How much have the symptoms interfered with usual daily activities? Quite a bit  How has condition changed since care began at this facility? NA - initial visit  In general, how is the patients overall health? Good   BACK PAIN (STarT Back Screening Tool) No

## 2023-08-08 ENCOUNTER — Ambulatory Visit: Payer: Medicaid Other

## 2023-08-08 NOTE — Therapy (Incomplete)
OUTPATIENT PHYSICAL THERAPY SHOULDER EVALUATION   Patient Name: Katie Sherman MRN: [id number] DOB:08-28-1965, 58 y.o., female Today's Date: 08/08/2023  END OF SESSION:    Past Medical History:  Diagnosis Date   Anxiety 06/01/2021   Arthritis    hands, right knee, left shoulder   Bilateral carpal tunnel syndrome 03/19/2018   Bipolar disorder (HCC)    bipolar 1 disorder   Cervical cancer (HCC)    COPD (chronic obstructive pulmonary disease) (HCC)    emphysema mild per patient   Depression    Dyspnea    with exertion   GERD (gastroesophageal reflux disease)    Hemoptysis 01/11/2021   History of exercise stress test    ETT-Echo 5/18: normal EF, no ischemia   History of hiatal hernia    no current problems as of 05/22/23 per patient   History of kidney stones    passed stones, no surgery   History of loop recorder 2018   Patient does not have Loop Recorder.  In 2018, the patient wore a heart monitor for 30 days for a low heart rate - study was normal per patient.   HSV-2 (herpes simplex virus 2) infection    tx with valtrex   Pharyngeal dysphagia 01/11/2021   Pneumonia    x 1   PTSD (post-traumatic stress disorder) 09/25/2022   Past Surgical History:  Procedure Laterality Date   CARPAL TUNNEL RELEASE Left 05/29/2023   Procedure: LEFT CARPAL TUNNEL RELEASE;  Surgeon: Cammy Copa, MD;  Location: [facility] OR;  Service: Orthopedics;  Laterality: Left;   CERVICAL CONE BIOPSY     MULTIPLE TOOTH EXTRACTIONS     all teeth extracted - does not wear dentures   SHOULDER ARTHROSCOPY WITH SUBACROMIAL DECOMPRESSION, ROTATOR CUFF REPAIR AND BICEP TENDON REPAIR Left 05/29/2023   Procedure: LEFT SHOULDER ARTHROSCOPY, DEBRIDEMENT, MINI OPEN BICEPS TENODESIS AND ROTATOR CUFF TEAR REPAIR;  Surgeon: Cammy Copa, MD;  Location: [facility] OR;  Service: Orthopedics;  Laterality: Left;   TONSILLECTOMY     TUBAL LIGATION     WISDOM TOOTH EXTRACTION     Patient Active Problem List    Diagnosis Date Noted   Synovitis of left shoulder 06/03/2023   Degenerative superior labral anterior-to-posterior (SLAP) tear of left shoulder 06/03/2023   Tendonitis of upper biceps tendon of left shoulder 06/03/2023   Complete tear of left rotator cuff 06/03/2023   S/P rotator cuff repair 05/29/2023   Homelessness 02/07/2022   Chronic pain syndrome 02/07/2022   Costochondritis 09/05/2021   Tobacco dependence 08/07/2020   Lung nodules 02/16/2020   Vitamin D deficiency 02/19/2019   Carpal tunnel syndrome, left upper limb 03/19/2018   Mixed hyperlipidemia 11/14/2017   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   Postmenopausal 01/22/2017   Bipolar 1 disorder, depressed (HCC) 12/29/2016   Family history of suicide 11/20/2016   Depression 11/20/2016   Family history of breast cancer 11/20/2016   Adhesive capsulitis of left shoulder 11/19/2016   Greater trochanteric bursitis of right hip 11/19/2016    PCP: Georganna Skeans, MD   REFERRING PROVIDER: Julieanne Cotton, PA-C  REFERRING DIAG:  Incomplete rotator cuff tear or rupture of left shoulder, not specified as traumatic [M75.112]  THERAPY DIAG:  No diagnosis found.  Rationale for Evaluation and Treatment: Rehabilitation  ONSET DATE:  left shoulder rotator cuff repair, biceps tenodesis, and carpal tunnel release on 05/29/2023   SUBJECTIVE:  SUBJECTIVE STATEMENT: ***  Pt states that she was out of town and has had multiple schedule mishaps, being the main reason she hasn't been able to follow up with surgical team. Pt states that she currently has her neighbor or roommate assist with ADLs such has grooming or putting up her hair and lifting things. Hand dominance: Right  PERTINENT HISTORY: left shoulder rotator cuff repair, biceps tenodesis,  and carpal tunnel release on 05/29/2023, Bipolar disorder  PAIN:  Are you having pain? Yes: NPRS scale: Y Pain location: Anterior L shoulder Pain description: sharp Aggravating factors: any movement, laying supine Relieving factors: seated rest  PRECAUTIONS: Plan is start physical therapy in 2 weeks at [facility].  At that point (1 month out) she will be okay for passive range of motion and active assisted range of motion but no full active range of motion or any rotator cuff strengthening exercises until she is 6 weeks postop.  Again I discouraged Deleah from any active range of motion or lifting with the operative extremity.  She understands and agrees with plan.  Follow-up in 3 weeks for clinical recheck with Dr. August Saucer.  RED FLAGS: None   WEIGHT BEARING RESTRICTIONS: No  FALLS:  Has patient fallen in last 6 months? 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  PATIENT SURVEYS:  FOTO current 52% (anticipated 65%)  COGNITION: Overall cognitive status: Within functional limits for tasks assessed     SENSATION: WFL  POSTURE: Rounded shoulders  UPPER EXTREMITY ROM:   Passive ROM Right eval Left eval  Shoulder flexion  40  Shoulder extension  40  Shoulder abduction  40  Shoulder adduction    Shoulder internal rotation  70  Shoulder external rotation  0  Elbow flexion  130  Elbow extension  0  Wrist flexion    Wrist extension    Wrist ulnar deviation    Wrist radial deviation    Wrist pronation    Wrist supination    (Blank rows = not tested)  UPPER EXTREMITY MMT: Unable to test at eval d/t pain  MMT Right eval Left eval  Shoulder flexion    Shoulder  extension    Shoulder abduction    Shoulder adduction    Shoulder internal rotation    Shoulder external rotation    Middle trapezius    Lower trapezius    Elbow flexion    Elbow extension    Wrist flexion    Wrist extension    Wrist ulnar deviation    Wrist radial deviation    Wrist pronation    Wrist supination    Grip strength (lbs)    (Blank rows = not tested)  SHOULDER SPECIAL TESTS: Not indicated  JOINT MOBILITY TESTING:  Not indicated  PALPATION:  TTP over bicipital groove and lesser tubercle of humeral head   [facility] Adult PT Treatment:                                                DATE: 08/08/23 Therapeutic Exercise: Seated shoulder flexion table slides Seated scapular retraction  Supine isometrics with therapist resistance ER/abd/flex/ext Supine shoulder flexion AAROM with dowel? Supine chest press with dowel? Manual Therapy: PROM all directions with stretch at end range                                                                                                                             [facility] Adult PT Treatment:                                                DATE: 07/30/2023 Therapeutic Activity: Reviewed HEP and application to daily function    PATIENT EDUCATION: Education details: Pt received education regarding HEP performance, ADL performance, functional activity tolerance, impairment education, appropriate performance of therapeutic activities. Person educated: Patient Education method: Explanation, Demonstration, Tactile cues, and Verbal cues Education comprehension: verbalized understanding, returned demonstration, and verbal cues required  HOME EXERCISE PROGRAM: Access Code: [email or web address] URL: [email or web address] Date: 07/30/2023 Prepared by: Sheliah Plane  Exercises - Seated Shoulder Flexion Towel Slide at Table Top  - 1 x daily - 7 x weekly - 3 sets - 8 reps - 5s hold - Isometric Shoulder Extension at Wall  - 1 x daily - 7 x  weekly - 3 sets - 8 reps - 5s hold - Isometric Shoulder Abduction at Wall  - 1 x daily - 7 x weekly - 3 sets - 8 reps - 5s hold - Isometric Shoulder External Rotation at Wall  - 1 x daily - 7 x weekly - 3 sets - 8 reps - 5s hold  Could not print d/t technical issues in the clinic, print next visit  ASSESSMENT:  CLINICAL IMPRESSION: ***  Eval impression (07/30/2023) Pt. attended today's physical therapy session for evaluation of L shoulder rotator cuff repair, bicep tendonesis, and carpal tunnel release. Pt is currently 9 weeks s/p and is significantly behind normal rehabilitative schedule. Pt has complaints of high levels of pain, weakness and overall reduction of daily function . Pt has notable deficits with pain, mobility and scapular stability. Pt would benefit from initial therapeutic focus on increasing ROM and stabilizing shoulder throughout available ROM. Pt was educated on need to follow up with surgical team. Pt demonstrated good understanding of education provided and required moderate vocal/tactile cues for appropriate performance with today's activities.    OBJECTIVE IMPAIRMENTS: decreased coordination, decreased mobility, decreased ROM, decreased strength, increased edema, increased fascial restrictions, impaired flexibility, impaired UE functional use, improper body mechanics, and pain.   ACTIVITY LIMITATIONS: carrying, lifting, sleeping, bathing, toileting, dressing, self feeding, reach over head, hygiene/grooming, and caring for others  PARTICIPATION LIMITATIONS: meal prep, cleaning, laundry, interpersonal relationship, driving, shopping, occupation, and yard work  PERSONAL FACTORS: Age, Behavior pattern, Fitness, and Time since onset of injury/illness/exacerbation are also affecting patient's functional outcome.   REHAB POTENTIAL: Fair see assessment  CLINICAL DECISION MAKING: Evolving/moderate  complexity  EVALUATION COMPLEXITY: Moderate   GOALS: Goals reviewed with  patient? Yes  SHORT TERM GOALS: Target date: 08/20/2023   Pt will be independent with administered HEP to demonstrate the competency necessary for long term managemnet of symptoms at home. Baseline: Goal status: INITIAL  2.  Pt will improve Passive ROM to 90 degrees of L shoulder flexion to allow for more functional modified use during ADLs such as washing hair Baseline: 40 deg Goal status: INITIAL   LONG TERM GOALS: Target date: 09/10/2023  Pt. Will achieve a FOTO score of 65% as to demonstrate improvement in self-perceived functional ability with daily activities. Baseline: 52% Goal status: INITIAL  2.  Pt will improve gross L shoulder strength to 4/5 to demonstrate improvement in strength for quality of motion and activity performance.  Baseline:  Goal status: INITIAL  3.  Pt will improve global AROM of L shoulder to 80% of standardized norms as to demonstrate necessary motion for functional use during ADLs  Baseline:  Goal status: INITIAL    PLAN:  PT FREQUENCY: 2x/week  PT DURATION: 6 weeks  PLANNED INTERVENTIONS: 97110-Therapeutic exercises, 97530- Therapeutic activity, 97112- Neuromuscular re-education, 97535- Self Care, 16109- Manual therapy, 97760- Splinting, Taping, Joint mobilization, and Scar mobilization  PLAN FOR NEXT SESSION: review and print HEP, passive/active assisted ROM work, scapular stabilization, rotator cuff strengthening, See precautions   Berta Minor PTA 08/08/2023, 10:14 AM

## 2023-08-10 ENCOUNTER — Ambulatory Visit: Payer: Medicaid Other | Admitting: Surgical

## 2023-08-13 ENCOUNTER — Encounter: Payer: Self-pay | Admitting: Physical Therapy

## 2023-08-13 ENCOUNTER — Ambulatory Visit: Payer: Medicaid Other | Attending: Surgical | Admitting: Physical Therapy

## 2023-08-13 DIAGNOSIS — M25612 Stiffness of left shoulder, not elsewhere classified: Secondary | ICD-10-CM | POA: Insufficient documentation

## 2023-08-13 DIAGNOSIS — M25512 Pain in left shoulder: Secondary | ICD-10-CM | POA: Diagnosis present

## 2023-08-13 DIAGNOSIS — G8929 Other chronic pain: Secondary | ICD-10-CM | POA: Insufficient documentation

## 2023-08-13 DIAGNOSIS — M75112 Incomplete rotator cuff tear or rupture of left shoulder, not specified as traumatic: Secondary | ICD-10-CM | POA: Diagnosis present

## 2023-08-13 NOTE — Therapy (Signed)
OUTPATIENT PHYSICAL THERAPY SHOULDER EVALUATION   Patient Name: Katie Sherman MRN: [id number] DOB:18-Jul-1965, 58 y.o., female Today's Date: 08/13/2023  END OF SESSION:  PT End of Session - 08/13/23 1357     Visit Number 2    Number of Visits 13    Date for PT Re-Evaluation 09/10/23    PT Start Time 1400    PT Stop Time 1440    PT Time Calculation (min) 40 min              Past Medical History:  Diagnosis Date   Anxiety 06/01/2021   Arthritis    hands, right knee, left shoulder   Bilateral carpal tunnel syndrome 03/19/2018   Bipolar disorder (HCC)    bipolar 1 disorder   Cervical cancer (HCC)    COPD (chronic obstructive pulmonary disease) (HCC)    emphysema mild per patient   Depression    Dyspnea    with exertion   GERD (gastroesophageal reflux disease)    Hemoptysis 01/11/2021   History of exercise stress test    ETT-Echo 5/18: normal EF, no ischemia   History of hiatal hernia    no current problems as of 05/22/23 per patient   History of kidney stones    passed stones, no surgery   History of loop recorder 2018   Patient does not have Loop Recorder.  In 2018, the patient wore a heart monitor for 30 days for a low heart rate - study was normal per patient.   HSV-2 (herpes simplex virus 2) infection    tx with valtrex   Pharyngeal dysphagia 01/11/2021   Pneumonia    x 1   PTSD (post-traumatic stress disorder) 09/25/2022   Past Surgical History:  Procedure Laterality Date   CARPAL TUNNEL RELEASE Left 05/29/2023   Procedure: LEFT CARPAL TUNNEL RELEASE;  Surgeon: Cammy Copa, MD;  Location: [facility] OR;  Service: Orthopedics;  Laterality: Left;   CERVICAL CONE BIOPSY     MULTIPLE TOOTH EXTRACTIONS     all teeth extracted - does not wear dentures   SHOULDER ARTHROSCOPY WITH SUBACROMIAL DECOMPRESSION, ROTATOR CUFF REPAIR AND BICEP TENDON REPAIR Left 05/29/2023   Procedure: LEFT SHOULDER ARTHROSCOPY, DEBRIDEMENT, MINI OPEN BICEPS TENODESIS AND ROTATOR  CUFF TEAR REPAIR;  Surgeon: Cammy Copa, MD;  Location: [facility] OR;  Service: Orthopedics;  Laterality: Left;   TONSILLECTOMY     TUBAL LIGATION     WISDOM TOOTH EXTRACTION     Patient Active Problem List   Diagnosis Date Noted   Synovitis of left shoulder 06/03/2023   Degenerative superior labral anterior-to-posterior (SLAP) tear of left shoulder 06/03/2023   Tendonitis of upper biceps tendon of left shoulder 06/03/2023   Complete tear of left rotator cuff 06/03/2023   S/P rotator cuff repair 05/29/2023   Homelessness 02/07/2022   Chronic pain syndrome 02/07/2022   Costochondritis 09/05/2021   Tobacco dependence 08/07/2020   Lung nodules 02/16/2020   Vitamin D deficiency 02/19/2019   Carpal tunnel syndrome, left upper limb 03/19/2018   Mixed hyperlipidemia 11/14/2017   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   Postmenopausal 01/22/2017   Bipolar 1 disorder, depressed (HCC) 12/29/2016   Family history of suicide 11/20/2016   Depression 11/20/2016   Family history of breast cancer 11/20/2016   Adhesive capsulitis of left shoulder 11/19/2016   Greater trochanteric bursitis of right hip 11/19/2016    PCP: Georganna Skeans, MD   REFERRING PROVIDER: Julieanne Cotton, PA-C  REFERRING DIAG:  Incomplete  rotator cuff tear or rupture of left shoulder, not specified as traumatic [M75.112]  THERAPY DIAG:  Stiffness of left shoulder, not elsewhere classified  Chronic left shoulder pain  Incomplete rotator cuff tear or rupture of left shoulder, not specified as traumatic  Rationale for Evaluation and Treatment: Rehabilitation  ONSET DATE:  left shoulder rotator cuff repair, biceps tenodesis, and carpal tunnel release on 05/29/2023   SUBJECTIVE:                                                                                                                                                                                      SUBJECTIVE STATEMENT: Pt stated that she has  been very busy lately, 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   Hand dominance: Right  PERTINENT HISTORY: left shoulder rotator cuff repair, biceps tenodesis, and carpal tunnel release on 05/29/2023, Bipolar disorder  PAIN:  Are you having pain? Yes: NPRS scale: Y Pain location: Anterior L shoulder Pain description: sharp Aggravating factors: any movement, laying supine Relieving factors: seated rest  PRECAUTIONS: Plan is start physical therapy in 2 weeks at [facility].  At that point (1 month out) she will be okay for passive range of motion and active assisted range of motion but no full active range of motion or any rotator cuff strengthening exercises until she is 6 weeks postop.  Again I discouraged Brandy from any active range of motion or lifting with the operative extremity.  She understands and agrees with plan.  Follow-up in 3 weeks for clinical recheck with Dr. August Saucer.  RED FLAGS: None   WEIGHT BEARING RESTRICTIONS: No  FALLS:  Has patient fallen in last 6 months? 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  PATIENT SURVEYS:  FOTO current 52% (anticipated 65%)  COGNITION: Overall cognitive status: Within functional limits for tasks assessed     SENSATION: WFL  POSTURE: Rounded shoulders  UPPER EXTREMITY ROM:   Passive ROM Right eval Left eval  Shoulder flexion  40  Shoulder extension  40  Shoulder abduction  40  Shoulder adduction    Shoulder internal rotation  70  Shoulder external rotation  0  Elbow flexion  130   Elbow extension  0  Wrist  flexion    Wrist extension    Wrist ulnar deviation    Wrist radial deviation    Wrist pronation    Wrist supination    (Blank rows = not tested)  UPPER EXTREMITY MMT: Unable to test at eval d/t pain  MMT Right eval Left eval  Shoulder flexion    Shoulder extension    Shoulder abduction    Shoulder adduction    Shoulder internal rotation    Shoulder external rotation    Middle trapezius    Lower trapezius    Elbow flexion    Elbow extension    Wrist flexion    Wrist extension    Wrist ulnar deviation    Wrist radial deviation    Wrist pronation    Wrist supination    Grip strength (lbs)    (Blank rows = not tested)  SHOULDER SPECIAL TESTS: Not indicated  JOINT MOBILITY TESTING:  Not indicated  PALPATION:  TTP over bicipital groove and lesser tubercle of humeral head   [facility] Adult PT Treatment:                                                DATE: 08/13/2023  Self care POC discussion Prognosis discussion Surgical protocol HEP review                                                                                                                 [facility] Adult PT Treatment:                                                DATE: 07/30/2023 Therapeutic Activity: Reviewed HEP and application to daily function    PATIENT EDUCATION: Education details: Pt received education regarding HEP performance, ADL performance, functional activity tolerance, impairment education, appropriate performance of therapeutic activities. Person educated: Patient Education method: Explanation, Demonstration, Tactile cues, and Verbal cues Education comprehension: verbalized understanding, returned demonstration, and verbal cues required  HOME EXERCISE PROGRAM: Access Code: [email or web address] URL: [email or web address] Date: 08/13/2023 Prepared by: Sheliah Plane  Exercises - Seated Shoulder Flexion Towel Slide at Table Top  - 1 x daily - 7 x weekly - 3 sets  - 8 reps - 5s hold - Shoulder Scaption AAROM with Dowel  - 1 x daily - 7 x weekly - 2-3 sets - 15 reps - Standing Wall [employer] in Scaption with Mini Swiss Ball  - 1 x daily - 4-7 x weekly - 2-3 sets - 2 reps - 30s hold - Standing Wall [employer] with [employer]  - 1 x daily - 4-7 x weekly - 2-3 sets - 2 reps - 30s hold - Isometric Shoulder External Rotation at Wall  - 1 x daily - 4-7 x weekly -  3 sets - 8 reps - 5s hold  ASSESSMENT:  CLINICAL IMPRESSION: Pt attended physical therapy session for continuation of treatment regarding L rotator cuff repair, bicep tendonesis, and carpal tunnel release. Today's treatment focused on education regarding current status versus surgical protocol, HEP performance, importance of surgical follow ups, prognosis coinciding with initial 9wk POC delay and clinic attendance. Pt acknowledged understanding of clinic attendance policy. An attempt was made to contact physician via phone call on behalf of pt, however, was brought to clinic call wait list with 9 calls ahead, had to abort call due to time constraints. Note will instead be routed to physician.   Pt is currently far outside of surgical protocol expectation due to initial delay in care and lack of patient participation in therapy. Due to concerns with current functional level, pain, and time since f/u with surgeon it was recommended that the pt does not continue with progression in therapy until cleared by surgeon to ensure safe holistic care. Pt was assisted in scheduling appt via mychart a follow up visit on the 14th of Feb with referring provider. Future therapy visits were rescheduled for after the f/u. HEP was updated to co align with current functional level.    Eval impression (07/30/2023) Pt. attended today's physical therapy session for evaluation of L shoulder rotator cuff repair, bicep tendonesis, and carpal tunnel release. Pt is currently 9 weeks s/p and is significantly behind normal  rehabilitative schedule. Pt has complaints of high levels of pain, weakness and overall reduction of daily function . Pt has notable deficits with pain, mobility and scapular stability. Pt would benefit from initial therapeutic focus on increasing ROM and stabilizing shoulder throughout available ROM. Pt was educated on need to follow up with surgical team. Pt demonstrated good understanding of education provided and required moderate vocal/tactile cues for appropriate performance with today's activities.    OBJECTIVE IMPAIRMENTS: decreased coordination, decreased mobility, decreased ROM, decreased strength, increased edema, increased fascial restrictions, impaired flexibility, impaired UE functional use, improper body mechanics, and pain.   ACTIVITY LIMITATIONS: carrying, lifting, sleeping, bathing, toileting, dressing, self feeding, reach over head, hygiene/grooming, and caring for others  PARTICIPATION LIMITATIONS: meal prep, cleaning, laundry, interpersonal relationship, driving, shopping, occupation, and yard work  PERSONAL FACTORS: Age, Behavior pattern, Fitness, and Time since onset of injury/illness/exacerbation are also affecting patient's functional outcome.   REHAB POTENTIAL: Fair see assessment  CLINICAL DECISION MAKING: Evolving/moderate complexity  EVALUATION COMPLEXITY: Moderate   GOALS: Goals reviewed with patient? Yes  SHORT TERM GOALS: Target date: 08/20/2023   Pt will be independent with administered HEP to demonstrate the competency necessary for long term managemnet of symptoms at home. Baseline: Goal status: INITIAL  2.  Pt will improve Passive ROM to 90 degrees of L shoulder flexion to allow for more functional modified use during ADLs such as washing hair Baseline: 40 deg Goal status: INITIAL   LONG TERM GOALS: Target date: 09/10/2023  Pt. Will achieve a FOTO score of 65% as to demonstrate improvement in self-perceived functional ability with daily  activities. Baseline: 52% Goal status: INITIAL  2.  Pt will improve gross L shoulder strength to 4/5 to demonstrate improvement in strength for quality of motion and activity performance.  Baseline:  Goal status: INITIAL  3.  Pt will improve global AROM of L shoulder to 80% of standardized norms as to demonstrate necessary motion for functional use during ADLs  Baseline:  Goal status: INITIAL    PLAN:  PT FREQUENCY: 2x/week  PT  DURATION: 6 weeks  PLANNED INTERVENTIONS: 97110-Therapeutic exercises, 97530- Therapeutic activity, 97112- Neuromuscular re-education, 97535- Self Care, 16109- Manual therapy, 97760- Splinting, Taping, Joint mobilization, and Scar mobilization  PLAN FOR NEXT SESSION: review and print HEP, passive/active assisted ROM work, scapular stabilization, rotator cuff strengthening, See precautions  Sheliah Plane, PT, DPT 08/13/2023, 3:06 PM

## 2023-08-15 ENCOUNTER — Ambulatory Visit: Payer: Medicaid Other | Admitting: Physical Therapy

## 2023-08-20 ENCOUNTER — Encounter: Payer: Medicaid Other | Admitting: Physical Therapy

## 2023-08-21 ENCOUNTER — Other Ambulatory Visit (HOSPITAL_COMMUNITY): Payer: Self-pay

## 2023-08-21 DIAGNOSIS — G894 Chronic pain syndrome: Secondary | ICD-10-CM | POA: Diagnosis not present

## 2023-08-21 DIAGNOSIS — M75112 Incomplete rotator cuff tear or rupture of left shoulder, not specified as traumatic: Secondary | ICD-10-CM | POA: Diagnosis not present

## 2023-08-21 DIAGNOSIS — Z79891 Long term (current) use of opiate analgesic: Secondary | ICD-10-CM | POA: Diagnosis not present

## 2023-08-21 DIAGNOSIS — M1990 Unspecified osteoarthritis, unspecified site: Secondary | ICD-10-CM | POA: Diagnosis not present

## 2023-08-21 DIAGNOSIS — M75102 Unspecified rotator cuff tear or rupture of left shoulder, not specified as traumatic: Secondary | ICD-10-CM | POA: Diagnosis not present

## 2023-08-21 MED ORDER — OXYCODONE-ACETAMINOPHEN 10-325 MG PO TABS
1.0000 | ORAL_TABLET | Freq: Three times a day (TID) | ORAL | 0 refills | Status: DC | PRN
  Filled 2023-08-21: qty 90, 30d supply, fill #0

## 2023-08-24 ENCOUNTER — Ambulatory Visit (INDEPENDENT_AMBULATORY_CARE_PROVIDER_SITE_OTHER): Payer: Medicaid Other | Admitting: Surgical

## 2023-08-24 ENCOUNTER — Encounter: Payer: Self-pay | Admitting: Surgical

## 2023-08-24 DIAGNOSIS — M75112 Incomplete rotator cuff tear or rupture of left shoulder, not specified as traumatic: Secondary | ICD-10-CM

## 2023-08-24 NOTE — Progress Notes (Signed)
Post-Op Visit Note   Patient: Katie Sherman           Date of Birth: 11-16-1965           MRN: [id number] Visit Date: 08/24/2023 PCP: Georganna Skeans, MD   Assessment & Plan:  Chief Complaint:  Chief Complaint  Patient presents with   Left Shoulder - Follow-up    Left shoulder scope 05/29/2023 and Carpal Tunnel Release    Visit Diagnoses:  1. Incomplete rotator cuff tear or rupture of left shoulder, not specified as traumatic     Plan: Patient is a 58 year old female who presents for reevaluation following left shoulder rotator cuff repair on 05/29/2023 with left carpal tunnel release on the same date.  Left hand is doing well.  No continued numbness or tingling.  Has not felt any "weird" pains lately but she does have some pain around the incision primarily with putting pressure on the incision.  Incision has healed well.  Regarding the left shoulder, she states that she has some stiffness in her shoulder.  She has difficulty bringing her hand to her head to do her hair.  She does have improved ability to tie her shoes recently.  She feels that the shoulder gets very stiff with the cold weather recently.  She had to take a break from physical therapy because her daughter recently had a baby and she has been helping out with this.  She does not feel like she has plateaued yet.  On exam, patient has intact EPL, FPL, finger abduction.  Incision well-healed on the left palm without evidence of infection or dehiscence.  Excellent grip strength of bilateral hands with no loss of grip strength of the operative hand.  Left shoulder with about 15 degrees X rotation, 70 degrees abduction, 90 degrees forward elevation passively and actively.  She has great rotator cuff strength of supra, infra, subscap rated 5/5.  Incisions are well-healed from prior shoulder surgery.  No evidence of infection or dehiscence.  Axillary nerve is intact with deltoid firing.  No Popeye deformity.  No weakness or pain  reproduced with supination or bicep flexion.  Impression is postoperative shoulder following rotator cuff repair about 3 months ago with excellent rotator cuff strength but Lenita is lacking range of motion of the shoulder especially external rotation.  Think that this is really getting in the way of her ability to accomplish a lot of her daily tasks and styling her hair.  I think that she primarily needs to work on range of motion exercises and put strengthening on the back burner for now since she already has good strength based on exam today.  She is planning on returning to physical therapy starting next Monday and strongly encouraged her to keep up with exercises every day throughout the day to optimize her shoulder range of motion.  She will follow-up for clinical recheck in 2 months.  Follow-Up Instructions: No follow-ups on file.   Orders:  No orders of the defined types were placed in this encounter.  No orders of the defined types were placed in this encounter.   Imaging: No results found.  PMFS History: Patient Active Problem List   Diagnosis Date Noted   Synovitis of left shoulder 06/03/2023   Degenerative superior labral anterior-to-posterior (SLAP) tear of left shoulder 06/03/2023   Tendonitis of upper biceps tendon of left shoulder 06/03/2023   Complete tear of left rotator cuff 06/03/2023   S/P rotator cuff repair 05/29/2023   Homelessness  02/07/2022   Chronic pain syndrome 02/07/2022   Costochondritis 09/05/2021   Tobacco dependence 08/07/2020   Lung nodules 02/16/2020   Vitamin D deficiency 02/19/2019   Carpal tunnel syndrome, left upper limb 03/19/2018   Mixed hyperlipidemia 11/14/2017   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   Postmenopausal 01/22/2017   Bipolar 1 disorder, depressed (HCC) 12/29/2016   Family history of suicide 11/20/2016   Depression 11/20/2016   Family history of breast cancer 11/20/2016   Adhesive capsulitis of left shoulder  11/19/2016   Greater trochanteric bursitis of right hip 11/19/2016   Past Medical History:  Diagnosis Date   Anxiety 06/01/2021   Arthritis    hands, right knee, left shoulder   Bilateral carpal tunnel syndrome 03/19/2018   Bipolar disorder (HCC)    bipolar 1 disorder   Cervical cancer (HCC)    COPD (chronic obstructive pulmonary disease) (HCC)    emphysema mild per patient   Depression    Dyspnea    with exertion   GERD (gastroesophageal reflux disease)    Hemoptysis 01/11/2021   History of exercise stress test    ETT-Echo 5/18: normal EF, no ischemia   History of hiatal hernia    no current problems as of 05/22/23 per patient   History of kidney stones    passed stones, no surgery   History of loop recorder 2018   Patient does not have Loop Recorder.  In 2018, the patient wore a heart monitor for 30 days for a low heart rate - study was normal per patient.   HSV-2 (herpes simplex virus 2) infection    tx with valtrex   Pharyngeal dysphagia 01/11/2021   Pneumonia    x 1   PTSD (post-traumatic stress disorder) 09/25/2022    Family History  Problem Relation Age of Onset   Heart attack Mother 40   Heart disease Father    Heart attack Father 42   Liver cancer Father    Bone cancer Other    Breast cancer Maternal Aunt        Diagnosed in 60's   Bone cancer Maternal Aunt     Past Surgical History:  Procedure Laterality Date   CARPAL TUNNEL RELEASE Left 05/29/2023   Procedure: LEFT CARPAL TUNNEL RELEASE;  Surgeon: Cammy Copa, MD;  Location: [facility] OR;  Service: Orthopedics;  Laterality: Left;   CERVICAL CONE BIOPSY     MULTIPLE TOOTH EXTRACTIONS     all teeth extracted - does not wear dentures   SHOULDER ARTHROSCOPY WITH SUBACROMIAL DECOMPRESSION, ROTATOR CUFF REPAIR AND BICEP TENDON REPAIR Left 05/29/2023   Procedure: LEFT SHOULDER ARTHROSCOPY, DEBRIDEMENT, MINI OPEN BICEPS TENODESIS AND ROTATOR CUFF TEAR REPAIR;  Surgeon: Cammy Copa, MD;  Location: [facility]  OR;  Service: Orthopedics;  Laterality: Left;   TONSILLECTOMY     TUBAL LIGATION     WISDOM TOOTH EXTRACTION     Social History   Occupational History   Occupation: unemployed  Tobacco Use   Smoking status: Every Day    Current packs/day: 1.00    Average packs/day: 1 pack/day for 44.1 years (44.1 ttl pk-yrs)    Types: Cigarettes    Start date: 63   Smokeless tobacco: Never   Tobacco comments:    Smokes 1 pack a day ARJ 01/20/21 - verified 05/22/23  Vaping Use   Vaping status: Never Used  Substance and Sexual Activity   Alcohol use: No   Drug use: Yes    Types: Marijuana  Comment: occasionally - last use was in 01/2023   Sexual activity: Not Currently    Birth control/protection: None

## 2023-08-27 ENCOUNTER — Other Ambulatory Visit (HOSPITAL_COMMUNITY): Payer: Self-pay

## 2023-08-27 ENCOUNTER — Ambulatory Visit: Payer: Medicaid Other | Admitting: Physical Therapy

## 2023-08-27 NOTE — Therapy (Deleted)
 OUTPATIENT PHYSICAL THERAPY SHOULDER EVALUATION   Patient Name: Katie Sherman MRN: [id number] DOB:Jul 13, 1965, 58 y.o., female Today's Date: 08/27/2023  END OF SESSION:     Past Medical History:  Diagnosis Date   Anxiety 06/01/2021   Arthritis    hands, right knee, left shoulder   Bilateral carpal tunnel syndrome 03/19/2018   Bipolar disorder (HCC)    bipolar 1 disorder   Cervical cancer (HCC)    COPD (chronic obstructive pulmonary disease) (HCC)    emphysema mild per patient   Depression    Dyspnea    with exertion   GERD (gastroesophageal reflux disease)    Hemoptysis 01/11/2021   History of exercise stress test    ETT-Echo 5/18: normal EF, no ischemia   History of hiatal hernia    no current problems as of 05/22/23 per patient   History of kidney stones    passed stones, no surgery   History of loop recorder 2018   Patient does not have Loop Recorder.  In 2018, the patient wore a heart monitor for 30 days for a low heart rate - study was normal per patient.   HSV-2 (herpes simplex virus 2) infection    tx with valtrex   Pharyngeal dysphagia 01/11/2021   Pneumonia    x 1   PTSD (post-traumatic stress disorder) 09/25/2022   Past Surgical History:  Procedure Laterality Date   CARPAL TUNNEL RELEASE Left 05/29/2023   Procedure: LEFT CARPAL TUNNEL RELEASE;  Surgeon: Cammy Copa, MD;  Location: [facility] OR;  Service: Orthopedics;  Laterality: Left;   CERVICAL CONE BIOPSY     MULTIPLE TOOTH EXTRACTIONS     all teeth extracted - does not wear dentures   SHOULDER ARTHROSCOPY WITH SUBACROMIAL DECOMPRESSION, ROTATOR CUFF REPAIR AND BICEP TENDON REPAIR Left 05/29/2023   Procedure: LEFT SHOULDER ARTHROSCOPY, DEBRIDEMENT, MINI OPEN BICEPS TENODESIS AND ROTATOR CUFF TEAR REPAIR;  Surgeon: Cammy Copa, MD;  Location: [facility] OR;  Service: Orthopedics;  Laterality: Left;   TONSILLECTOMY     TUBAL LIGATION     WISDOM TOOTH EXTRACTION     Patient Active Problem List    Diagnosis Date Noted   Synovitis of left shoulder 06/03/2023   Degenerative superior labral anterior-to-posterior (SLAP) tear of left shoulder 06/03/2023   Tendonitis of upper biceps tendon of left shoulder 06/03/2023   Complete tear of left rotator cuff 06/03/2023   S/P rotator cuff repair 05/29/2023   Homelessness 02/07/2022   Chronic pain syndrome 02/07/2022   Costochondritis 09/05/2021   Tobacco dependence 08/07/2020   Lung nodules 02/16/2020   Vitamin D deficiency 02/19/2019   Carpal tunnel syndrome, left upper limb 03/19/2018   Mixed hyperlipidemia 11/14/2017   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   Postmenopausal 01/22/2017   Bipolar 1 disorder, depressed (HCC) 12/29/2016   Family history of suicide 11/20/2016   Depression 11/20/2016   Family history of breast cancer 11/20/2016   Adhesive capsulitis of left shoulder 11/19/2016   Greater trochanteric bursitis of right hip 11/19/2016    PCP: Georganna Skeans, MD   REFERRING PROVIDER: Julieanne Cotton, PA-C  REFERRING DIAG:  Incomplete rotator cuff tear or rupture of left shoulder, not specified as traumatic [M75.112]  THERAPY DIAG:  No diagnosis found.  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   Hand dominance: Right  PERTINENT HISTORY: left shoulder rotator cuff repair, biceps tenodesis, and carpal tunnel release on 05/29/2023, Bipolar disorder  PAIN:   Are you having pain? Yes: NPRS scale: Y Pain location: Anterior L shoulder Pain description: sharp Aggravating factors: any movement, laying supine Relieving factors: seated rest  PRECAUTIONS: Plan is start physical therapy in 2 weeks at [facility].  At that point (1 month out) she will be okay for passive range of motion and active assisted range of motion but no full active range of motion or any rotator cuff strengthening exercises until she is 6 weeks postop.  Again I discouraged Kathaleya from any active range of motion or lifting with the operative extremity.  She understands and agrees with plan.  Follow-up in 3 weeks for clinical recheck with Dr. August Saucer.  RED FLAGS: None   WEIGHT BEARING RESTRICTIONS: No  FALLS:  Has patient fallen in last 6 months? 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  PATIENT SURVEYS:  FOTO current 52% (anticipated 65%)  COGNITION: Overall cognitive status: Within functional limits for tasks assessed     SENSATION: WFL  POSTURE: Rounded shoulders  UPPER EXTREMITY ROM:   Passive ROM Right eval Left eval  Shoulder flexion  40  Shoulder extension  40  Shoulder abduction  40  Shoulder adduction    Shoulder internal rotation  70  Shoulder external rotation  0  Elbow flexion  130  Elbow extension  0  Wrist flexion    Wrist extension    Wrist ulnar deviation    Wrist radial deviation    Wrist pronation    Wrist supination    (Blank rows = not tested)  UPPER EXTREMITY MMT: Unable to test at eval d/t pain  MMT Right eval Left eval  Shoulder flexion    Shoulder extension    Shoulder abduction    Shoulder adduction    Shoulder  internal rotation    Shoulder external rotation    Middle trapezius    Lower trapezius    Elbow flexion    Elbow extension    Wrist flexion    Wrist extension    Wrist ulnar deviation    Wrist radial deviation    Wrist pronation    Wrist supination    Grip strength (lbs)    (Blank rows = not tested)  SHOULDER SPECIAL TESTS: Not indicated  JOINT MOBILITY TESTING:  Not indicated  PALPATION:  TTP over bicipital groove and lesser tubercle of humeral head   [facility] Adult PT Treatment:                                                DATE: 08/13/2023  Self care POC discussion Prognosis discussion Surgical protocol HEP review  [facility] Adult PT Treatment:                                                DATE: 07/30/2023 Therapeutic Activity: Reviewed HEP and application to daily function    PATIENT EDUCATION: Education details: Pt received education regarding HEP performance, ADL performance, functional activity tolerance, impairment education, appropriate performance of therapeutic activities. Person educated: Patient Education method: Explanation, Demonstration, Tactile cues, and Verbal cues Education comprehension: verbalized understanding, returned demonstration, and verbal cues required  HOME EXERCISE PROGRAM: Access Code: [email or web address] URL: [email or web address] Date: 08/13/2023 Prepared by: Sheliah Plane  Exercises - Seated Shoulder Flexion Towel Slide at Table Top  - 1 x daily - 7 x weekly - 3 sets - 8 reps - 5s hold - Shoulder Scaption AAROM with Dowel  - 1 x daily - 7 x weekly - 2-3 sets - 15 reps - Standing Wall [employer] in Scaption with Mini Swiss Ball  - 1 x daily - 4-7 x weekly - 2-3 sets - 2 reps - 30s hold - Standing Wall [employer] with [employer]  - 1 x daily - 4-7 x weekly - 2-3 sets - 2 reps - 30s hold - Isometric Shoulder External  Rotation at Wall  - 1 x daily - 4-7 x weekly - 3 sets - 8 reps - 5s hold  ASSESSMENT:  CLINICAL IMPRESSION: Pt attended physical therapy session for continuation of treatment regarding L shoulder rotator cuff repair. Pt recently had follow up with surgeon who reached out to mention that the shoulder is stable and she is good to begin active motion alongside progressive strengthening as tolerated. Today's treatment focused on improvement of  L shoulder ROM, active isolated control, and shoulder stability in available ROM. Pt showed  good tolerance to treatment and demonstrated improvement with ***. Some difficulties ***. Pt required *** cuing as well as *** assistance for safe and appropriate performance of ***. Continue with therapeutic focus on ***.     Eval impression (07/30/2023) Pt. attended today's physical therapy session for evaluation of L shoulder rotator cuff repair, bicep tendonesis, and carpal tunnel release. Pt is currently 9 weeks s/p and is significantly behind normal rehabilitative schedule. Pt has complaints of high levels of pain, weakness and overall reduction of daily function . Pt has notable deficits with pain, mobility and scapular stability. Pt would benefit from initial therapeutic focus on increasing ROM and stabilizing shoulder throughout available ROM. Pt was educated on need to follow up with surgical team. Pt demonstrated good understanding of education provided and required moderate vocal/tactile cues for appropriate performance with today's activities.    OBJECTIVE IMPAIRMENTS: decreased coordination, decreased mobility, decreased ROM, decreased strength, increased edema, increased fascial restrictions, impaired flexibility, impaired UE functional use, improper body mechanics, and pain.   ACTIVITY LIMITATIONS: carrying, lifting, sleeping, bathing, toileting, dressing, self feeding, reach over head, hygiene/grooming, and caring for others  PARTICIPATION LIMITATIONS: meal  prep, cleaning, laundry, interpersonal relationship, driving, shopping, occupation, and yard work  PERSONAL FACTORS: Age, Behavior pattern, Fitness, and Time since onset of injury/illness/exacerbation are also affecting patient's functional outcome.   REHAB POTENTIAL: Fair see assessment  CLINICAL DECISION MAKING: Evolving/moderate complexity  EVALUATION COMPLEXITY: Moderate   GOALS: Goals reviewed with patient? Yes  SHORT TERM GOALS: Target date: 08/20/2023   Pt will be independent with administered HEP to demonstrate  the competency necessary for long term managemnet of symptoms at home. Baseline: Goal status: INITIAL  2.  Pt will improve Passive ROM to 90 degrees of L shoulder flexion to allow for more functional modified use during ADLs such as washing hair Baseline: 40 deg Goal status: INITIAL   LONG TERM GOALS: Target date: 09/10/2023  Pt. Will achieve a FOTO score of 65% as to demonstrate improvement in self-perceived functional ability with daily activities. Baseline: 52% Goal status: INITIAL  2.  Pt will improve gross L shoulder strength to 4/5 to demonstrate improvement in strength for quality of motion and activity performance.  Baseline:  Goal status: INITIAL  3.  Pt will improve global AROM of L shoulder to 80% of standardized norms as to demonstrate necessary motion for functional use during ADLs  Baseline:  Goal status: INITIAL    PLAN:  PT FREQUENCY: 2x/week  PT DURATION: 6 weeks  PLANNED INTERVENTIONS: 97110-Therapeutic exercises, 97530- Therapeutic activity, 97112- Neuromuscular re-education, 97535- Self Care, 16109- Manual therapy, 97760- Splinting, Taping, Joint mobilization, and Scar mobilization  PLAN FOR NEXT SESSION: review and print HEP, passive/active assisted ROM work, scapular stabilization, rotator cuff strengthening, See precautions  Sheliah Plane, PT, DPT 08/27/2023, 3:40 PM

## 2023-08-28 ENCOUNTER — Ambulatory Visit: Payer: Medicaid Other | Admitting: Physical Therapy

## 2023-08-28 ENCOUNTER — Encounter: Payer: Self-pay | Admitting: Physical Therapy

## 2023-08-28 DIAGNOSIS — M25612 Stiffness of left shoulder, not elsewhere classified: Secondary | ICD-10-CM

## 2023-08-28 DIAGNOSIS — M75112 Incomplete rotator cuff tear or rupture of left shoulder, not specified as traumatic: Secondary | ICD-10-CM

## 2023-08-28 DIAGNOSIS — G8929 Other chronic pain: Secondary | ICD-10-CM

## 2023-08-28 NOTE — Therapy (Addendum)
 OUTPATIENT PHYSICAL THERAPY SHOULDER EVALUATION   Patient Name: Katie Sherman MRN: [id number] DOB:22-Oct-1965, 58 y.o., female Today's Date: 08/28/2023  PHYSICAL THERAPY DISCHARGE SUMMARY  Visits from Start of Care: 3  Current functional level related to goals / functional outcomes: Unknown, Pt did not participate in therapy   Remaining deficits: unknown   Education / Equipment: None, pt d/c d/t attendance    Patient agrees to discharge. Patient goals were  unknown . Patient is being discharged due to not returning since the last visit.   END OF SESSION:  PT End of Session - 08/28/23 1616     Visit Number 3    Number of Visits 13    Date for PT Re-Evaluation 09/10/23    PT Start Time 1415    PT Stop Time 1500    PT Time Calculation (min) 45 min    Activity Tolerance Patient limited by pain    Behavior During Therapy [facility] for tasks assessed/performed               Past Medical History:  Diagnosis Date   Anxiety 06/01/2021   Arthritis    hands, right knee, left shoulder   Bilateral carpal tunnel syndrome 03/19/2018   Bipolar disorder (HCC)    bipolar 1 disorder   Cervical cancer (HCC)    COPD (chronic obstructive pulmonary disease) (HCC)    emphysema mild per patient   Depression    Dyspnea    with exertion   GERD (gastroesophageal reflux disease)    Hemoptysis 01/11/2021   History of exercise stress test    ETT-Echo 5/18: normal EF, no ischemia   History of hiatal hernia    no current problems as of 05/22/23 per patient   History of kidney stones    passed stones, no surgery   History of loop recorder 2018   Patient does not have Loop Recorder.  In 2018, the patient wore a heart monitor for 30 days for a low heart rate - study was normal per patient.   HSV-2 (herpes simplex virus 2) infection    tx with valtrex   Pharyngeal dysphagia 01/11/2021   Pneumonia    x 1   PTSD (post-traumatic stress disorder) 09/25/2022   Past Surgical History:   Procedure Laterality Date   CARPAL TUNNEL RELEASE Left 05/29/2023   Procedure: LEFT CARPAL TUNNEL RELEASE;  Surgeon: Cammy Copa, MD;  Location: [facility] OR;  Service: Orthopedics;  Laterality: Left;   CERVICAL CONE BIOPSY     MULTIPLE TOOTH EXTRACTIONS     all teeth extracted - does not wear dentures   SHOULDER ARTHROSCOPY WITH SUBACROMIAL DECOMPRESSION, ROTATOR CUFF REPAIR AND BICEP TENDON REPAIR Left 05/29/2023   Procedure: LEFT SHOULDER ARTHROSCOPY, DEBRIDEMENT, MINI OPEN BICEPS TENODESIS AND ROTATOR CUFF TEAR REPAIR;  Surgeon: Cammy Copa, MD;  Location: [facility] OR;  Service: Orthopedics;  Laterality: Left;   TONSILLECTOMY     TUBAL LIGATION     WISDOM TOOTH EXTRACTION     Patient Active Problem List   Diagnosis Date Noted   Synovitis of left shoulder 06/03/2023   Degenerative superior labral anterior-to-posterior (SLAP) tear of left shoulder 06/03/2023   Tendonitis of upper biceps tendon of left shoulder 06/03/2023   Complete tear of left rotator cuff 06/03/2023   S/P rotator cuff repair 05/29/2023   Homelessness 02/07/2022   Chronic pain syndrome 02/07/2022   Costochondritis 09/05/2021   Tobacco dependence 08/07/2020   Lung nodules 02/16/2020   Vitamin D deficiency 02/19/2019  Carpal tunnel syndrome, left upper limb 03/19/2018   Mixed hyperlipidemia 11/14/2017   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   Postmenopausal 01/22/2017   Bipolar 1 disorder, depressed (HCC) 12/29/2016   Family history of suicide 11/20/2016   Depression 11/20/2016   Family history of breast cancer 11/20/2016   Adhesive capsulitis of left shoulder 11/19/2016   Greater trochanteric bursitis of right hip 11/19/2016    PCP: Georganna Skeans, MD   REFERRING PROVIDER: Julieanne Cotton, PA-C  REFERRING DIAG:  Incomplete rotator cuff tear or rupture of left shoulder, not specified as traumatic [M75.112]  THERAPY DIAG:  Stiffness of left shoulder, not elsewhere classified  Chronic  left shoulder pain  Incomplete rotator cuff tear or rupture of left shoulder, not specified as traumatic  Rationale for Evaluation and Treatment: Rehabilitation  ONSET DATE:  left shoulder rotator cuff repair, biceps tenodesis, and carpal tunnel release on 05/29/2023   SUBJECTIVE:                                                                                                                                                                                      SUBJECTIVE STATEMENT: Pt stated that she still can't put her hair up or sleep flat. Stated she has a Careers adviser follow up in 2 months.   Hand dominance: Right  PERTINENT HISTORY: left shoulder rotator cuff repair, biceps tenodesis, and carpal tunnel release on 05/29/2023, Bipolar disorder  PAIN:  Are you having pain? Yes: NPRS scale: Y Pain location: Anterior L shoulder Pain description: sharp Aggravating factors: any movement, laying supine Relieving factors: seated rest  PRECAUTIONS: Plan is start physical therapy in 2 weeks at [facility].  At that point (1 month out) she will be okay for passive range of motion and active assisted range of motion but no full active range of motion or any rotator cuff strengthening exercises until she is 6 weeks postop.  Again I discouraged Amrie from any active range of motion or lifting with the operative extremity.  She understands and agrees with plan.  Follow-up in 3 weeks for clinical recheck with Dr. August Saucer.  RED FLAGS: None   WEIGHT BEARING RESTRICTIONS: No  FALLS:  Has patient fallen in last 6 months? 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  PATIENT SURVEYS:  FOTO current  52% (anticipated 65%)  COGNITION: Overall cognitive status: Within  functional limits for tasks assessed     SENSATION: WFL  POSTURE: Rounded shoulders  UPPER EXTREMITY ROM:   Passive ROM Right eval Left eval  Shoulder flexion  40  Shoulder extension  40  Shoulder abduction  40  Shoulder adduction    Shoulder internal rotation  70  Shoulder external rotation  0  Elbow flexion  130  Elbow extension  0  Wrist flexion    Wrist extension    Wrist ulnar deviation    Wrist radial deviation    Wrist pronation    Wrist supination    (Blank rows = not tested)  UPPER EXTREMITY MMT: Unable to test at eval d/t pain  MMT Right eval Left eval  Shoulder flexion    Shoulder extension    Shoulder abduction    Shoulder adduction    Shoulder internal rotation    Shoulder external rotation    Middle trapezius    Lower trapezius    Elbow flexion    Elbow extension    Wrist flexion    Wrist extension    Wrist ulnar deviation    Wrist radial deviation    Wrist pronation    Wrist supination    Grip strength (lbs)    (Blank rows = not tested)  SHOULDER SPECIAL TESTS: Not indicated  JOINT MOBILITY TESTING:  Not indicated  PALPATION:  TTP over bicipital groove and lesser tubercle of humeral head   [facility] Adult PT Treatment:                                                DATE: 08/28/2023  Therapeutic Exercise: Pully's flex/abd 3'/3' PROM into ER/ABD/Flex w/ gr. 3-4 glide at end range  Therapeutic Activity: Cwise/Ccwise ball on wall. 4x30s ea. Direction Seated ER stretch 3x1' L shoulder at 70d abduction, help with dressing and putting up hair    [facility] Adult PT Treatment:                                                DATE: 08/13/2023  Self care POC discussion Prognosis discussion Surgical protocol HEP review                                                                                                                 [facility] Adult PT Treatment:                                                 DATE: 07/30/2023 Therapeutic Activity: Reviewed HEP and application to daily function    PATIENT EDUCATION: Education details: Pt received education regarding HEP performance, ADL performance,  functional activity tolerance, impairment education, appropriate performance of therapeutic activities. Person educated: Patient Education method: Explanation, Demonstration, Tactile cues, and Verbal cues Education comprehension: verbalized understanding, returned demonstration, and verbal cues required  HOME EXERCISE PROGRAM: Access Code: [email or web address] URL: [email or web address] Date: 08/13/2023 Prepared by: Sheliah Plane  Exercises - Seated Shoulder Flexion Towel Slide at Table Top  - 1 x daily - 7 x weekly - 3 sets - 8 reps - 5s hold - Shoulder Scaption AAROM with Dowel  - 1 x daily - 7 x weekly - 2-3 sets - 15 reps - Standing Wall [employer] in Scaption with Mini Swiss Ball  - 1 x daily - 4-7 x weekly - 2-3 sets - 2 reps - 30s hold - Standing Wall [employer] with [employer]  - 1 x daily - 4-7 x weekly - 2-3 sets - 2 reps - 30s hold - Isometric Shoulder External Rotation at Wall  - 1 x daily - 4-7 x weekly - 3 sets - 8 reps - 5s hold  ASSESSMENT:  CLINICAL IMPRESSION: Pt attended physical therapy session for continuation of treatment regarding L shoulder rotator cuff repair. Pt recently had follow up with surgeon who reached out to mention that the shoulder is stable and she is good to begin active motion alongside progressive strengthening as tolerated. Today's treatment focused on improvement of  L shoulder ROM, active isolated control, and shoulder stability in available ROM. Pt showed  good tolerance to treatment and demonstrated improvement with L shoulder PROM. Some difficulties continue with end range stability and pain. Pt required minimal cuing as well as no assistance for safe and appropriate performance of today's activities. Continue with  therapeutic focus on L shoulder ROM active and passive, gaining stability in new range per session.     Eval impression (07/30/2023) Pt. attended today's physical therapy session for evaluation of L shoulder rotator cuff repair, bicep tendonesis, and carpal tunnel release. Pt is currently 9 weeks s/p and is significantly behind normal rehabilitative schedule. Pt has complaints of high levels of pain, weakness and overall reduction of daily function . Pt has notable deficits with pain, mobility and scapular stability. Pt would benefit from initial therapeutic focus on increasing ROM and stabilizing shoulder throughout available ROM. Pt was educated on need to follow up with surgical team. Pt demonstrated good understanding of education provided and required moderate vocal/tactile cues for appropriate performance with today's activities.    OBJECTIVE IMPAIRMENTS: decreased coordination, decreased mobility, decreased ROM, decreased strength, increased edema, increased fascial restrictions, impaired flexibility, impaired UE functional use, improper body mechanics, and pain.   ACTIVITY LIMITATIONS: carrying, lifting, sleeping, bathing, toileting, dressing, self feeding, reach over head, hygiene/grooming, and caring for others  PARTICIPATION LIMITATIONS: meal prep, cleaning, laundry, interpersonal relationship, driving, shopping, occupation, and yard work  PERSONAL FACTORS: Age, Behavior pattern, Fitness, and Time since onset of injury/illness/exacerbation are also affecting patient's functional outcome.   REHAB POTENTIAL: Fair see assessment  CLINICAL DECISION MAKING: Evolving/moderate complexity  EVALUATION COMPLEXITY: Moderate   GOALS: Goals reviewed with patient? Yes  SHORT TERM GOALS: Target date: 08/20/2023   Pt will be independent with administered HEP to demonstrate the competency necessary for long term managemnet of symptoms at home. Baseline: Goal status: INITIAL  2.  Pt will  improve Passive ROM to 90 degrees of L shoulder flexion to allow for more functional modified use during ADLs such as washing hair Baseline: 40 deg Goal status: INITIAL   LONG TERM GOALS: Target date:  09/10/2023  Pt. Will achieve a FOTO score of 65% as to demonstrate improvement in self-perceived functional ability with daily activities. Baseline: 52% Goal status: INITIAL  2.  Pt will improve gross L shoulder strength to 4/5 to demonstrate improvement in strength for quality of motion and activity performance.  Baseline:  Goal status: INITIAL  3.  Pt will improve global AROM of L shoulder to 80% of standardized norms as to demonstrate necessary motion for functional use during ADLs  Baseline:  Goal status: INITIAL    PLAN:  PT FREQUENCY: 2x/week  PT DURATION: 6 weeks  PLANNED INTERVENTIONS: 97110-Therapeutic exercises, 97530- Therapeutic activity, 97112- Neuromuscular re-education, 97535- Self Care, 16109- Manual therapy, 97760- Splinting, Taping, Joint mobilization, and Scar mobilization  PLAN FOR NEXT SESSION: continue A/PROM, end range stability  Sheliah Plane, PT, DPT 08/28/2023, 4:58 PM

## 2023-08-30 ENCOUNTER — Ambulatory Visit: Payer: Medicaid Other | Admitting: Physical Therapy

## 2023-08-30 NOTE — Therapy (Incomplete)
OUTPATIENT PHYSICAL THERAPY SHOULDER EVALUATION   Patient Name: Katie Sherman MRN: [id number] DOB:1966/05/28, 58 y.o., female Today's Date: 08/30/2023  END OF SESSION:      Past Medical History:  Diagnosis Date   Anxiety 06/01/2021   Arthritis    hands, right knee, left shoulder   Bilateral carpal tunnel syndrome 03/19/2018   Bipolar disorder (HCC)    bipolar 1 disorder   Cervical cancer (HCC)    COPD (chronic obstructive pulmonary disease) (HCC)    emphysema mild per patient   Depression    Dyspnea    with exertion   GERD (gastroesophageal reflux disease)    Hemoptysis 01/11/2021   History of exercise stress test    ETT-Echo 5/18: normal EF, no ischemia   History of hiatal hernia    no current problems as of 05/22/23 per patient   History of kidney stones    passed stones, no surgery   History of loop recorder 2018   Patient does not have Loop Recorder.  In 2018, the patient wore a heart monitor for 30 days for a low heart rate - study was normal per patient.   HSV-2 (herpes simplex virus 2) infection    tx with valtrex   Pharyngeal dysphagia 01/11/2021   Pneumonia    x 1   PTSD (post-traumatic stress disorder) 09/25/2022   Past Surgical History:  Procedure Laterality Date   CARPAL TUNNEL RELEASE Left 05/29/2023   Procedure: LEFT CARPAL TUNNEL RELEASE;  Surgeon: Cammy Copa, MD;  Location: [facility] OR;  Service: Orthopedics;  Laterality: Left;   CERVICAL CONE BIOPSY     MULTIPLE TOOTH EXTRACTIONS     all teeth extracted - does not wear dentures   SHOULDER ARTHROSCOPY WITH SUBACROMIAL DECOMPRESSION, ROTATOR CUFF REPAIR AND BICEP TENDON REPAIR Left 05/29/2023   Procedure: LEFT SHOULDER ARTHROSCOPY, DEBRIDEMENT, MINI OPEN BICEPS TENODESIS AND ROTATOR CUFF TEAR REPAIR;  Surgeon: Cammy Copa, MD;  Location: [facility] OR;  Service: Orthopedics;  Laterality: Left;   TONSILLECTOMY     TUBAL LIGATION     WISDOM TOOTH EXTRACTION     Patient Active Problem List    Diagnosis Date Noted   Synovitis of left shoulder 06/03/2023   Degenerative superior labral anterior-to-posterior (SLAP) tear of left shoulder 06/03/2023   Tendonitis of upper biceps tendon of left shoulder 06/03/2023   Complete tear of left rotator cuff 06/03/2023   S/P rotator cuff repair 05/29/2023   Homelessness 02/07/2022   Chronic pain syndrome 02/07/2022   Costochondritis 09/05/2021   Tobacco dependence 08/07/2020   Lung nodules 02/16/2020   Vitamin D deficiency 02/19/2019   Carpal tunnel syndrome, left upper limb 03/19/2018   Mixed hyperlipidemia 11/14/2017   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   Postmenopausal 01/22/2017   Bipolar 1 disorder, depressed (HCC) 12/29/2016   Family history of suicide 11/20/2016   Depression 11/20/2016   Family history of breast cancer 11/20/2016   Adhesive capsulitis of left shoulder 11/19/2016   Greater trochanteric bursitis of right hip 11/19/2016    PCP: Georganna Skeans, MD   REFERRING PROVIDER: Julieanne Cotton, PA-C  REFERRING DIAG:  Incomplete rotator cuff tear or rupture of left shoulder, not specified as traumatic [M75.112]  THERAPY DIAG:  No diagnosis found.  Rationale for Evaluation and Treatment: Rehabilitation  ONSET DATE:  left shoulder rotator cuff repair, biceps tenodesis, and carpal tunnel release on 05/29/2023   SUBJECTIVE:  SUBJECTIVE STATEMENT: ***  Hand dominance: Right  PERTINENT HISTORY: left shoulder rotator cuff repair, biceps tenodesis, and carpal tunnel release on 05/29/2023, Bipolar disorder  PAIN:  Are you having pain? Yes: NPRS scale: Y Pain location: Anterior L shoulder Pain description: sharp Aggravating factors: any movement, laying supine Relieving factors: seated rest  PRECAUTIONS: Plan is start  physical therapy in 2 weeks at [facility] PT.  At that point (1 month out) she will be okay for passive range of motion and active assisted range of motion but no full active range of motion or any rotator cuff strengthening exercises until she is 6 weeks postop.  Again I discouraged Cyndal from any active range of motion or lifting with the operative extremity.  She understands and agrees with plan.  Follow-up in 3 weeks for clinical recheck with Dr. August Saucer.  RED FLAGS: None   WEIGHT BEARING RESTRICTIONS: No  FALLS:  Has patient fallen in last 6 months? 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  PATIENT SURVEYS:  FOTO current 52% (anticipated 65%)  COGNITION: Overall cognitive status: Within functional limits for tasks assessed     SENSATION: WFL  POSTURE: Rounded shoulders  UPPER EXTREMITY ROM:   Passive ROM Right eval Left eval  Shoulder flexion  40  Shoulder extension  40  Shoulder abduction  40  Shoulder adduction    Shoulder internal rotation  70  Shoulder external rotation  0  Elbow flexion  130  Elbow extension  0  Wrist flexion    Wrist extension    Wrist ulnar deviation    Wrist radial deviation    Wrist pronation    Wrist supination    (Blank rows = not tested)  UPPER EXTREMITY MMT: Unable to test at eval d/t pain  MMT Right eval Left eval  Shoulder flexion    Shoulder extension    Shoulder abduction    Shoulder adduction    Shoulder internal rotation    Shoulder external rotation    Middle trapezius    Lower trapezius    Elbow flexion    Elbow extension    Wrist flexion    Wrist extension    Wrist ulnar deviation    Wrist radial  deviation    Wrist pronation    Wrist supination    Grip strength (lbs)    (Blank rows = not tested)  SHOULDER SPECIAL TESTS: Not indicated  JOINT MOBILITY TESTING:  Not indicated  PALPATION:  TTP over bicipital groove and lesser tubercle of humeral head   [facility] Adult PT Treatment:                                                DATE: 08/30/2023  Therapeutic Exercise: Pully's flex/abd 3'/3' PROM into ER/ABD/Flex w/ gr. 3-4 glide at end range  Therapeutic Activity: Wall slides Finger climb Seated ER stretch 3x1' L shoulder at 70d abduction, help with dressing and putting up hair   PATIENT EDUCATION: Education details: Pt received education regarding HEP performance, ADL performance, functional activity tolerance, impairment education, appropriate performance of therapeutic  activities. Person educated: Patient Education method: Explanation, Demonstration, Tactile cues, and Verbal cues Education comprehension: verbalized understanding, returned demonstration, and verbal cues required  HOME EXERCISE PROGRAM: Access Code: [email or web address] URL: [email or web address] Date: 08/13/2023 Prepared by: Sheliah Plane  Exercises - Seated Shoulder Flexion Towel Slide at Table Top  - 1 x daily - 7 x weekly - 3 sets - 8 reps - 5s hold - Shoulder Scaption AAROM with Dowel  - 1 x daily - 7 x weekly - 2-3 sets - 15 reps - Standing Wall [employer] in Scaption with Mini Swiss Ball  - 1 x daily - 4-7 x weekly - 2-3 sets - 2 reps - 30s hold - Standing Wall [employer] with [employer]  - 1 x daily - 4-7 x weekly - 2-3 sets - 2 reps - 30s hold - Isometric Shoulder External Rotation at Wall  - 1 x daily - 4-7 x weekly - 3 sets - 8 reps - 5s hold  ASSESSMENT:  CLINICAL IMPRESSION: Pt attended physical therapy session for continuation of treatment regarding L shoulder rotator cuff repair. Pt recently had follow up with surgeon who reached out to mention that the shoulder is stable and  she is good to begin active motion alongside progressive strengthening as tolerated. Today's treatment focused on improvement of  L shoulder ROM, active isolated control, and shoulder stability in available ROM. Pt showed  good tolerance to treatment and demonstrated improvement with L shoulder PROM. Some difficulties continue with end range stability and pain. Pt required minimal cuing as well as no assistance for safe and appropriate performance of today's activities. Continue with therapeutic focus on L shoulder ROM active and passive, gaining stability in new range per session.     Eval impression (07/30/2023) Pt. attended today's physical therapy session for evaluation of L shoulder rotator cuff repair, bicep tendonesis, and carpal tunnel release. Pt is currently 9 weeks s/p and is significantly behind normal rehabilitative schedule. Pt has complaints of high levels of pain, weakness and overall reduction of daily function . Pt has notable deficits with pain, mobility and scapular stability. Pt would benefit from initial therapeutic focus on increasing ROM and stabilizing shoulder throughout available ROM. Pt was educated on need to follow up with surgical team. Pt demonstrated good understanding of education provided and required moderate vocal/tactile cues for appropriate performance with today's activities.    OBJECTIVE IMPAIRMENTS: decreased coordination, decreased mobility, decreased ROM, decreased strength, increased edema, increased fascial restrictions, impaired flexibility, impaired UE functional use, improper body mechanics, and pain.   ACTIVITY LIMITATIONS: carrying, lifting, sleeping, bathing, toileting, dressing, self feeding, reach over head, hygiene/grooming, and caring for others  PARTICIPATION LIMITATIONS: meal prep, cleaning, laundry, interpersonal relationship, driving, shopping, occupation, and yard work  PERSONAL FACTORS: Age, Behavior pattern, Fitness, and Time since onset of  injury/illness/exacerbation are also affecting patient's functional outcome.   REHAB POTENTIAL: Fair see assessment  CLINICAL DECISION MAKING: Evolving/moderate complexity  EVALUATION COMPLEXITY: Moderate   GOALS: Goals reviewed with patient? Yes  SHORT TERM GOALS: Target date: 08/20/2023   Pt will be independent with administered HEP to demonstrate the competency necessary for long term managemnet of symptoms at home. Baseline: Goal status: INITIAL  2.  Pt will improve Passive ROM to 90 degrees of L shoulder flexion to allow for more functional modified use during ADLs such as washing hair Baseline: 40 deg Goal status: INITIAL   LONG TERM GOALS: Target date: 09/10/2023  Pt. Will achieve a FOTO score of  65% as to demonstrate improvement in self-perceived functional ability with daily activities. Baseline: 52% Goal status: INITIAL  2.  Pt will improve gross L shoulder strength to 4/5 to demonstrate improvement in strength for quality of motion and activity performance.  Baseline:  Goal status: INITIAL  3.  Pt will improve global AROM of L shoulder to 80% of standardized norms as to demonstrate necessary motion for functional use during ADLs  Baseline:  Goal status: INITIAL    PLAN:  PT FREQUENCY: 2x/week  PT DURATION: 6 weeks  PLANNED INTERVENTIONS: 97110-Therapeutic exercises, 97530- Therapeutic activity, 97112- Neuromuscular re-education, 97535- Self Care, 16109- Manual therapy, 97760- Splinting, Taping, Joint mobilization, and Scar mobilization  PLAN FOR NEXT SESSION: continue A/PROM, end range stability  Sheliah Plane, PT, DPT 08/30/2023, 1:41 PM

## 2023-09-05 ENCOUNTER — Ambulatory Visit: Payer: Medicaid Other | Admitting: Physical Therapy

## 2023-09-10 ENCOUNTER — Ambulatory Visit: Payer: Medicaid Other | Admitting: Orthopedic Surgery

## 2023-09-11 ENCOUNTER — Other Ambulatory Visit: Payer: Self-pay

## 2023-09-17 ENCOUNTER — Encounter: Payer: Self-pay | Admitting: Family Medicine

## 2023-09-18 ENCOUNTER — Encounter (HOSPITAL_COMMUNITY): Payer: Self-pay

## 2023-09-18 ENCOUNTER — Other Ambulatory Visit (HOSPITAL_COMMUNITY): Payer: Self-pay

## 2023-09-18 MED ORDER — OXYCODONE-ACETAMINOPHEN 10-325 MG PO TABS
1.0000 | ORAL_TABLET | Freq: Four times a day (QID) | ORAL | 0 refills | Status: DC | PRN
  Filled 2023-09-18 (×2): qty 120, 30d supply, fill #0

## 2023-09-18 NOTE — Telephone Encounter (Signed)
 Appointment scheduled.

## 2023-09-18 NOTE — Telephone Encounter (Signed)
 Katie Sherman, can you call Katie Sherman and get her an appointment to evaluate her left leg pain?

## 2023-09-21 ENCOUNTER — Ambulatory Visit: Admitting: Surgical

## 2023-09-24 ENCOUNTER — Encounter (HOSPITAL_COMMUNITY): Payer: Self-pay | Admitting: Psychiatry

## 2023-09-24 ENCOUNTER — Other Ambulatory Visit: Payer: Self-pay

## 2023-09-24 ENCOUNTER — Other Ambulatory Visit (HOSPITAL_COMMUNITY): Payer: Self-pay

## 2023-09-24 ENCOUNTER — Telehealth (HOSPITAL_BASED_OUTPATIENT_CLINIC_OR_DEPARTMENT_OTHER): Payer: Medicaid Other | Admitting: Psychiatry

## 2023-09-24 VITALS — Wt 188.0 lb

## 2023-09-24 DIAGNOSIS — F319 Bipolar disorder, unspecified: Secondary | ICD-10-CM | POA: Diagnosis not present

## 2023-09-24 DIAGNOSIS — F4310 Post-traumatic stress disorder, unspecified: Secondary | ICD-10-CM

## 2023-09-24 MED ORDER — LAMOTRIGINE 100 MG PO TABS
300.0000 mg | ORAL_TABLET | Freq: Every day | ORAL | 2 refills | Status: DC
  Filled 2023-09-24: qty 90, 30d supply, fill #0
  Filled 2023-11-04: qty 90, 30d supply, fill #1
  Filled 2023-12-05: qty 90, 30d supply, fill #2

## 2023-09-24 MED ORDER — QUETIAPINE FUMARATE 100 MG PO TABS
100.0000 mg | ORAL_TABLET | Freq: Every day | ORAL | 2 refills | Status: AC
  Filled 2023-09-24: qty 30, 30d supply, fill #0
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
## 2023-10-01 ENCOUNTER — Other Ambulatory Visit (HOSPITAL_COMMUNITY): Payer: Self-pay

## 2023-10-05 ENCOUNTER — Other Ambulatory Visit: Payer: Self-pay | Admitting: Family Medicine

## 2023-10-05 DIAGNOSIS — Z122 Encounter for screening for malignant neoplasm of respiratory organs: Secondary | ICD-10-CM

## 2023-10-16 ENCOUNTER — Other Ambulatory Visit (HOSPITAL_COMMUNITY): Payer: Self-pay

## 2023-10-17 ENCOUNTER — Other Ambulatory Visit (HOSPITAL_COMMUNITY): Payer: Self-pay

## 2023-10-18 ENCOUNTER — Other Ambulatory Visit: Payer: Self-pay

## 2023-10-19 ENCOUNTER — Other Ambulatory Visit (HOSPITAL_COMMUNITY): Payer: Self-pay

## 2023-10-19 MED ORDER — OXYCODONE-ACETAMINOPHEN 10-325 MG PO TABS
1.0000 | ORAL_TABLET | Freq: Four times a day (QID) | ORAL | 0 refills | Status: DC | PRN
  Filled 2023-10-19: qty 30, 7d supply, fill #0
  Filled 2023-10-19: qty 90, 23d supply, fill #0

## 2023-10-22 ENCOUNTER — Ambulatory Visit: Payer: Medicaid Other | Admitting: Surgical

## 2023-10-31 ENCOUNTER — Other Ambulatory Visit (INDEPENDENT_AMBULATORY_CARE_PROVIDER_SITE_OTHER): Payer: Self-pay

## 2023-10-31 ENCOUNTER — Other Ambulatory Visit: Payer: Self-pay

## 2023-10-31 ENCOUNTER — Ambulatory Visit (INDEPENDENT_AMBULATORY_CARE_PROVIDER_SITE_OTHER): Admitting: Surgical

## 2023-10-31 DIAGNOSIS — M25511 Pain in right shoulder: Secondary | ICD-10-CM | POA: Diagnosis not present

## 2023-10-31 DIAGNOSIS — M7521 Bicipital tendinitis, right shoulder: Secondary | ICD-10-CM

## 2023-11-04 ENCOUNTER — Other Ambulatory Visit: Payer: Self-pay | Admitting: Critical Care Medicine

## 2023-11-04 ENCOUNTER — Encounter: Payer: Self-pay | Admitting: Surgical

## 2023-11-04 ENCOUNTER — Encounter: Payer: Self-pay | Admitting: Family Medicine

## 2023-11-04 MED ORDER — LIDOCAINE HCL 1 % IJ SOLN
5.0000 mL | INTRAMUSCULAR | Status: AC | PRN
  Administered 2023-10-31: 5 mL

## 2023-11-04 MED ORDER — TRIAMCINOLONE ACETONIDE 40 MG/ML IJ SUSP
40.0000 mg | INTRAMUSCULAR | Status: AC | PRN
Start: 2023-10-31 — End: 2023-10-31
  Administered 2023-10-31: 40 mg via INTRA_ARTICULAR

## 2023-11-04 MED ORDER — BUPIVACAINE HCL 0.25 % IJ SOLN
9.0000 mL | INTRAMUSCULAR | Status: AC | PRN
Start: 2023-10-31 — End: 2023-10-31
  Administered 2023-10-31: 9 mL via INTRA_ARTICULAR

## 2023-11-04 NOTE — Progress Notes (Signed)
 Office Visit Note   Patient: Katie Sherman           Date of Birth: 1966/05/01           MRN: [id number] Visit Date: 10/31/2023 Requested by: Abraham Abo, MD [address] [place],  [place] [address] PCP: Abraham Abo, MD  Subjective: Chief Complaint  Patient presents with   Left Shoulder - Follow-up    Left shoulder scope 05/29/2023 and Carpal Tunnel Release     HPI: Katie Sherman is a 58 y.o. female who presents to the office reporting right shoulder pain.  Left shoulder is overall doing well out from her rotator cuff repair and she feels more more function with this.  However over the last month she has developed more anterior right shoulder pain.  Pain is worse with lifting.  No radiation of pain, numbness/tingling, neck pain, scapular pain.  No history of prior surgery to the shoulder.  No mechanical symptoms.  Does feel weak in the arm.  Some right sided numbness and tingling in her hand is consistent with carpal tunnel syndrome but not quite as bad as the left hand was prior to carpal tunnel surgery..                ROS: All systems reviewed are negative as they relate to the chief complaint within the history of present illness.  Patient denies fevers or chills.  Assessment & Plan: Visit Diagnoses:  1. Acute pain of right shoulder     Plan: Patient is a 58 year old female who presents for evaluation of right shoulder pain.  Has pain over the last month without history of injury.  Pain mostly localizing to the anterior aspect of the shoulder.  Has positive O'Brien sign and tenderness of the bicipital groove.  No significant changes or concerning findings on radiographs of the right shoulder today.  Discussed options available to patient she would like to try shoulder injection.  Ultrasound-guided right glenohumeral injection administered and patient tolerated procedure well.  Follow-up with office as needed if pain does not improve.  Next step would be MRI  arthrogram of the right shoulder.  Follow-Up Instructions: No follow-ups on file.   Orders:  Orders Placed This Encounter  Procedures   XR Shoulder Right   US  Guided Needle Placement - No Linked Charges   No orders of the defined types were placed in this encounter.     Procedures: Large Joint Inj: R glenohumeral on 10/31/2023 11:01 AM Indications: pain and diagnostic evaluation Details: 22 G 3.5 in needle, ultrasound-guided posterior approach Medications: 5 mL lidocaine  1 %; 9 mL bupivacaine  0.25 %; 40 mg triamcinolone acetonide 40 MG/ML Outcome: tolerated well, no immediate complications Procedure, treatment alternatives, risks and benefits explained, specific risks discussed. Consent was given by the patient. Immediately prior to procedure a time out was called to verify the correct patient, procedure, equipment, support staff and site/side marked as required. Patient was prepped and draped in the usual sterile fashion.       Clinical Data: No additional findings.  Objective: Vital Signs: LMP 04/29/2012   Physical Exam:  Constitutional: Patient appears well-developed HEENT:  Head: Normocephalic Eyes:EOM are normal Neck: Normal range of motion Cardiovascular: Normal rate Pulmonary/chest: Effort normal Neurologic: Patient is alert Skin: Skin is warm Psychiatric: Patient has normal mood and affect  Ortho Exam: Ortho exam demonstrates right shoulder with 70 degrees X rotation, 70 degrees abduction, 150 degrees forward elevation.  This is compared with  the left shoulder with 20 degrees X rotation, 80 degrees abduction, 100 degrees forward elevation passively and actively.  She has intact rotator cuff strength of the right shoulder rated 5/5 of infra, subscap, supra.  She has moderate tenderness over the bicipital groove.  No Popeye deformity.  Positive O'Brien sign.  No tenderness over the [facility] joint.  Intact EPL, FPL, finger abduction, pronation/supination, bicep, tricep,  deltoid.  Specialty Comments:  No specialty comments available.  Imaging: No results found.   PMFS History: Patient Active Problem List   Diagnosis Date Noted   Synovitis of left shoulder 06/03/2023   Degenerative superior labral anterior-to-posterior (SLAP) tear of left shoulder 06/03/2023   Tendonitis of upper biceps tendon of left shoulder 06/03/2023   Complete tear of left rotator cuff 06/03/2023   S/P rotator cuff repair 05/29/2023   Homelessness 02/07/2022   Chronic pain syndrome 02/07/2022   Costochondritis 09/05/2021   Tobacco dependence 08/07/2020   Lung nodules 02/16/2020   Vitamin D  deficiency 02/19/2019   Carpal tunnel syndrome, left upper limb 03/19/2018   Mixed hyperlipidemia 11/14/2017   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   Postmenopausal 01/22/2017   Bipolar 1 disorder, depressed (HCC) 12/29/2016   Family history of suicide 11/20/2016   Depression 11/20/2016   Family history of breast cancer 11/20/2016   Adhesive capsulitis of left shoulder 11/19/2016   Greater trochanteric bursitis of right hip 11/19/2016   Past Medical History:  Diagnosis Date   Anxiety 06/01/2021   Arthritis    hands, right knee, left shoulder   Bilateral carpal tunnel syndrome 03/19/2018   Bipolar disorder (HCC)    bipolar 1 disorder   Cervical cancer (HCC)    COPD (chronic obstructive pulmonary disease) (HCC)    emphysema mild per patient   Depression    Dyspnea    with exertion   GERD (gastroesophageal reflux disease)    Hemoptysis 01/11/2021   History of exercise stress test    ETT-Echo 5/18: normal EF, no ischemia   History of hiatal hernia    no current problems as of 05/22/23 per patient   History of kidney stones    passed stones, no surgery   History of loop recorder 2018   Patient does not have Loop Recorder.  In 2018, the patient wore a heart monitor for 30 days for a low heart rate - study was normal per patient.   HSV-2 (herpes simplex virus 2)  infection    tx with valtrex    Pharyngeal dysphagia 01/11/2021   Pneumonia    x 1   PTSD (post-traumatic stress disorder) 09/25/2022    Family History  Problem Relation Age of Onset   Heart attack Mother 79   Heart disease Father    Heart attack Father 1   Liver cancer Father    Bone cancer Other    Breast cancer Maternal Aunt        Diagnosed in 47's   Bone cancer Maternal Aunt     Past Surgical History:  Procedure Laterality Date   CARPAL TUNNEL RELEASE Left 05/29/2023   Procedure: LEFT CARPAL TUNNEL RELEASE;  Surgeon: Jasmine Mesi, MD;  Location: [facility] OR;  Service: Orthopedics;  Laterality: Left;   CERVICAL CONE BIOPSY     MULTIPLE TOOTH EXTRACTIONS     all teeth extracted - does not wear dentures   SHOULDER ARTHROSCOPY WITH SUBACROMIAL DECOMPRESSION, ROTATOR CUFF REPAIR AND BICEP TENDON REPAIR Left 05/29/2023   Procedure: LEFT SHOULDER ARTHROSCOPY, DEBRIDEMENT, MINI OPEN  BICEPS TENODESIS AND ROTATOR CUFF TEAR REPAIR;  Surgeon: Jasmine Mesi, MD;  Location: [facility] OR;  Service: Orthopedics;  Laterality: Left;   TONSILLECTOMY     TUBAL LIGATION     WISDOM TOOTH EXTRACTION     Social History   Occupational History   Occupation: unemployed  Tobacco Use   Smoking status: Every Day    Current packs/day: 1.00    Average packs/day: 1 pack/day for 44.3 years (44.3 ttl pk-yrs)    Types: Cigarettes    Start date: 79   Smokeless tobacco: Never   Tobacco comments:    Smokes 1 pack a day ARJ 01/20/21 - verified 05/22/23  Vaping Use   Vaping status: Never Used  Substance and Sexual Activity   Alcohol use: No   Drug use: Yes    Types: Marijuana    Comment: occasionally - last use was in 01/2023   Sexual activity: Not Currently    Birth control/protection: None

## 2023-11-05 ENCOUNTER — Encounter: Payer: Self-pay | Admitting: Nurse Practitioner

## 2023-11-05 ENCOUNTER — Other Ambulatory Visit: Payer: Self-pay

## 2023-11-05 ENCOUNTER — Encounter: Payer: Self-pay | Admitting: Family Medicine

## 2023-11-06 ENCOUNTER — Ambulatory Visit: Admitting: Nurse Practitioner

## 2023-11-08 ENCOUNTER — Ambulatory Visit: Admitting: Family Medicine

## 2023-11-13 ENCOUNTER — Other Ambulatory Visit (HOSPITAL_COMMUNITY): Payer: Self-pay

## 2023-11-15 ENCOUNTER — Other Ambulatory Visit (HOSPITAL_COMMUNITY): Payer: Self-pay

## 2023-11-16 ENCOUNTER — Other Ambulatory Visit: Payer: Self-pay

## 2023-11-16 ENCOUNTER — Other Ambulatory Visit (HOSPITAL_COMMUNITY): Payer: Self-pay

## 2023-11-16 MED ORDER — OXYCODONE-ACETAMINOPHEN 10-325 MG PO TABS
1.0000 | ORAL_TABLET | Freq: Four times a day (QID) | ORAL | 0 refills | Status: DC | PRN
  Filled 2023-11-16: qty 120, 30d supply, fill #0

## 2023-11-19 ENCOUNTER — Other Ambulatory Visit (HOSPITAL_COMMUNITY): Payer: Self-pay

## 2023-11-21 ENCOUNTER — Other Ambulatory Visit: Payer: Self-pay

## 2023-11-21 ENCOUNTER — Other Ambulatory Visit: Payer: Self-pay | Admitting: Critical Care Medicine

## 2023-11-21 MED ORDER — VITAMIN D (ERGOCALCIFEROL) 1.25 MG (50000 UNIT) PO CAPS
50000.0000 [IU] | ORAL_CAPSULE | ORAL | 0 refills | Status: DC
  Filled 2023-11-21: qty 4, 28d supply, fill #0

## 2023-11-22 ENCOUNTER — Other Ambulatory Visit (HOSPITAL_COMMUNITY): Payer: Self-pay

## 2023-11-27 ENCOUNTER — Other Ambulatory Visit (HOSPITAL_COMMUNITY): Payer: Self-pay

## 2023-12-05 ENCOUNTER — Other Ambulatory Visit: Payer: Self-pay

## 2023-12-17 NOTE — Telephone Encounter (Signed)
 Nora Beal, can you add [place] into the schedule for one of the days next week, anytime works

## 2023-12-18 ENCOUNTER — Other Ambulatory Visit (HOSPITAL_COMMUNITY): Payer: Self-pay

## 2023-12-18 ENCOUNTER — Other Ambulatory Visit: Payer: Self-pay

## 2023-12-18 MED ORDER — NALOXONE HCL 4 MG/0.1ML NA LIQD
NASAL | 1 refills | Status: AC
  Filled 2023-12-18 (×2): qty 2, 1d supply, fill #0

## 2023-12-18 MED ORDER — OXYCODONE-ACETAMINOPHEN 10-325 MG PO TABS
1.0000 | ORAL_TABLET | Freq: Four times a day (QID) | ORAL | 0 refills | Status: AC | PRN
  Filled 2023-12-18: qty 120, 30d supply, fill #0

## 2023-12-19 ENCOUNTER — Other Ambulatory Visit (HOSPITAL_COMMUNITY): Payer: Self-pay

## 2023-12-20 ENCOUNTER — Encounter: Payer: Self-pay | Admitting: Family Medicine

## 2023-12-20 ENCOUNTER — Ambulatory Visit (INDEPENDENT_AMBULATORY_CARE_PROVIDER_SITE_OTHER): Payer: Medicaid Other | Admitting: Family Medicine

## 2023-12-20 VITALS — BP 143/86 | HR 86 | Wt 197.0 lb

## 2023-12-20 DIAGNOSIS — Z7689 Persons encountering health services in other specified circumstances: Secondary | ICD-10-CM | POA: Diagnosis not present

## 2023-12-20 DIAGNOSIS — F319 Bipolar disorder, unspecified: Secondary | ICD-10-CM

## 2023-12-20 DIAGNOSIS — E6609 Other obesity due to excess calories: Secondary | ICD-10-CM

## 2023-12-20 DIAGNOSIS — Z6830 Body mass index (BMI) 30.0-30.9, adult: Secondary | ICD-10-CM

## 2023-12-20 DIAGNOSIS — B009 Herpesviral infection, unspecified: Secondary | ICD-10-CM

## 2023-12-20 DIAGNOSIS — K219 Gastro-esophageal reflux disease without esophagitis: Secondary | ICD-10-CM | POA: Diagnosis not present

## 2023-12-20 DIAGNOSIS — E785 Hyperlipidemia, unspecified: Secondary | ICD-10-CM | POA: Diagnosis not present

## 2023-12-20 DIAGNOSIS — R030 Elevated blood-pressure reading, without diagnosis of hypertension: Secondary | ICD-10-CM | POA: Diagnosis not present

## 2023-12-20 DIAGNOSIS — F17200 Nicotine dependence, unspecified, uncomplicated: Secondary | ICD-10-CM

## 2023-12-20 DIAGNOSIS — E66811 Obesity, class 1: Secondary | ICD-10-CM

## 2023-12-20 NOTE — Progress Notes (Signed)
 Established Patient Office Visit  Subjective    Patient ID: Katie Sherman, female    DOB: 23-Aug-1965  Age: 58 y.o. MRN: [id number]  CC:  Chief Complaint  Patient presents with   Medical Management of Chronic Issues    HPI SHAWNICE TILMON presents for routine follow up of chronic med issues including GERD. Patient reports med compliance and denies acute complaints. She would like to start wegovy for weight loss if possible.   Outpatient Encounter Medications as of 12/20/2023  Medication Sig   albuterol  (VENTOLIN  HFA) 108 (90 Base) MCG/ACT inhaler Inhale 2 puffs into the lungs every 6 (six) hours as needed for wheezing or shortness of breath.   cyclobenzaprine  (FLEXERIL ) 10 MG tablet Take 1 tablet (10 mg total) by mouth 3 (three) times daily as needed for muscle spasms.   ibuprofen  (ADVIL ) 600 MG tablet Take 1 tablet (600 mg total) by mouth every 8 (eight) hours as needed. (Patient taking differently: Take 600 mg by mouth every 8 (eight) hours as needed for mild pain (pain score 1-3).)   lamoTRIgine  (LAMICTAL ) 100 MG tablet Take 3 tablets (300 mg total) by mouth daily.   naloxone  (NARCAN ) nasal spray 4 mg/0.1 mL Use in case of accidental overdose   naloxone  (NARCAN ) nasal spray 4 mg/0.1 mL Spray as directed in one nostril as needed for opioid overdose.   nitroGLYCERIN  (NITRODUR - DOSED IN MG/24 HR) 0.2 mg/hr patch Apply 1/4 patch daily to the affected area (Patient taking differently: Place 0.2 mg onto the skin daily as needed (For pain).)   omeprazole  (PRILOSEC) 20 MG capsule Take 1 capsule (20 mg total) by mouth daily.   oxyCODONE -acetaminophen  (PERCOCET) 10-325 MG tablet Take 1 tablet by mouth every 6 (six) hours as needed for moderate pain   oxyCODONE -acetaminophen  (PERCOCET) 10-325 MG tablet Take 1 tablet by mouth every 6 (six) hours as needed for moderate pain.   pravastatin  (PRAVACHOL ) 40 MG tablet Take 1 tablet (40 mg total) by mouth daily.   QUEtiapine  (SEROQUEL ) 100 MG tablet  Take 1 tablet (100 mg total) by mouth at bedtime.   Semaglutide-Weight Management (WEGOVY) 0.25 MG/0.5ML SOAJ Inject 0.25 mg into the skin once a week.   valACYclovir  (VALTREX ) 500 MG tablet Take 1 tablet (500 mg total) by mouth daily.   Vitamin D , Ergocalciferol , (DRISDOL ) 1.25 MG (50000 UNIT) CAPS capsule Take 1 capsule (50,000 Units total) by mouth every 7 (seven) days.   No facility-administered encounter medications on file as of 12/20/2023.    Past Medical History:  Diagnosis Date   Anxiety 06/01/2021   Arthritis    hands, right knee, left shoulder   Bilateral carpal tunnel syndrome 03/19/2018   Bipolar disorder (HCC)    bipolar 1 disorder   Cervical cancer (HCC)    COPD (chronic obstructive pulmonary disease) (HCC)    emphysema mild per patient   Depression    Dyspnea    with exertion   GERD (gastroesophageal reflux disease)    Hemoptysis 01/11/2021   History of exercise stress test    ETT-Echo 5/18: normal EF, no ischemia   History of hiatal hernia    no current problems as of 05/22/23 per patient   History of kidney stones    passed stones, no surgery   History of loop recorder 2018   Patient does not have Loop Recorder.  In 2018, the patient wore a heart monitor for 30 days for a low heart rate - study was normal per patient.  HSV-2 (herpes simplex virus 2) infection    tx with valtrex    Pharyngeal dysphagia 01/11/2021   Pneumonia    x 1   PTSD (post-traumatic stress disorder) 09/25/2022    Past Surgical History:  Procedure Laterality Date   CARPAL TUNNEL RELEASE Left 05/29/2023   Procedure: LEFT CARPAL TUNNEL RELEASE;  Surgeon: Jasmine Mesi, MD;  Location: [facility] OR;  Service: Orthopedics;  Laterality: Left;   CERVICAL CONE BIOPSY     MULTIPLE TOOTH EXTRACTIONS     all teeth extracted - does not wear dentures   SHOULDER ARTHROSCOPY WITH SUBACROMIAL DECOMPRESSION, ROTATOR CUFF REPAIR AND BICEP TENDON REPAIR Left 05/29/2023   Procedure: LEFT SHOULDER  ARTHROSCOPY, DEBRIDEMENT, MINI OPEN BICEPS TENODESIS AND ROTATOR CUFF TEAR REPAIR;  Surgeon: Jasmine Mesi, MD;  Location: [facility] OR;  Service: Orthopedics;  Laterality: Left;   TONSILLECTOMY     TUBAL LIGATION     WISDOM TOOTH EXTRACTION      Family History  Problem Relation Age of Onset   Heart attack Mother 4   Heart disease Father    Heart attack Father 76   Liver cancer Father    Bone cancer Other    Breast cancer Maternal Aunt        Diagnosed in 25's   Bone cancer Maternal Aunt     Social History   Socioeconomic History   Marital status: Widowed    Spouse name: Not on file   Number of children: Not on file   Years of education: Not on file   Highest education level: Not on file  Occupational History   Occupation: unemployed  Tobacco Use   Smoking status: Every Day    Current packs/day: 1.00    Average packs/day: 1 pack/day for 44.4 years (44.4 ttl pk-yrs)    Types: Cigarettes    Start date: 19   Smokeless tobacco: Never   Tobacco comments:    Smokes 1 pack a day ARJ 01/20/21 - verified 05/22/23  Vaping Use   Vaping status: Never Used  Substance and Sexual Activity   Alcohol use: No   Drug use: Yes    Types: Marijuana    Comment: occasionally - last use was in 01/2023   Sexual activity: Not Currently    Birth control/protection: None  Other Topics Concern   Not on file  Social History Narrative   Prior traffic control specialist - unemployed now   Widow   5 children, 10 grandchildren   Moved here from [place] 2013   Social Drivers of Health   Financial Resource Strain: Low Risk  (06/11/2023)   Overall Financial Resource Strain (CARDIA)    Difficulty of Paying Living Expenses: Not hard at all  Food Insecurity: No Food Insecurity (05/29/2023)   Hunger Vital Sign    Worried About Running Out of Food in the Last Year: Never true    Ran Out of Food in the Last Year: Never true  Transportation Needs: No Transportation Needs (05/29/2023)   PRAPARE -  Administrator, Civil Service (Medical): No    Lack of Transportation (Non-Medical): No  Physical Activity: Inactive (06/11/2023)   Exercise Vital Sign    Days of Exercise per Week: 0 days    Minutes of Exercise per Session: 0 min  Stress: Stress Concern Present (06/11/2023)   [employer] of Occupational Health - Occupational Stress Questionnaire    Feeling of Stress : Rather much  Social Connections: Socially Isolated (06/11/2023)   Social  Connection and Isolation Panel    Frequency of Communication with Friends and Family: More than three times a week    Frequency of Social Gatherings with Friends and Family: More than three times a week    Attends Religious Services: Never    Database administrator or Organizations: No    Attends Banker Meetings: Never    Marital Status: Widowed  Intimate Partner Violence: Not At Risk (05/29/2023)   Humiliation, Afraid, Rape, and Kick questionnaire    Fear of Current or Ex-Partner: No    Emotionally Abused: No    Physically Abused: No    Sexually Abused: No    Review of Systems  All other systems reviewed and are negative.       Objective    BP (!) 143/86 (BP Location: Right Arm, Patient Position: Sitting, Cuff Size: Normal)   Pulse 86   Wt 197 lb (89.4 kg)   LMP 04/29/2012   SpO2 96%   BMI 30.85 kg/m   Physical Exam Vitals and nursing note reviewed.  Constitutional:      General: She is not in acute distress.    Appearance: She is obese.   Cardiovascular:     Rate and Rhythm: Normal rate and regular rhythm.  Pulmonary:     Effort: Pulmonary effort is normal.     Breath sounds: Normal breath sounds.  Abdominal:     Palpations: Abdomen is soft.     Tenderness: There is no abdominal tenderness.   Neurological:     General: No focal deficit present.     Mental Status: She is alert and oriented to person, place, and time.   Psychiatric:        Mood and Affect: Mood normal.        Behavior:  Behavior normal.         Assessment & Plan:   Gastroesophageal reflux disease without esophagitis  Hyperlipidemia, unspecified hyperlipidemia type  Elevated blood pressure reading  Encounter for weight management  Class 1 obesity due to excess calories with serious comorbidity and body mass index (BMI) of 30.0 to 30.9 in adult -     [id number]; Inject 0.25 mg into the skin once a week.  Dispense: 2 mL; Refill: 0  Bipolar 1 disorder, depressed (HCC)  Herpes  Smoker     Return in about 6 weeks (around 01/31/2024) for follow up, chronic med issues.   Arlo Lama, MD

## 2023-12-21 ENCOUNTER — Encounter: Payer: Self-pay | Admitting: Family Medicine

## 2023-12-21 ENCOUNTER — Other Ambulatory Visit: Payer: Self-pay | Admitting: Family Medicine

## 2023-12-21 ENCOUNTER — Other Ambulatory Visit (HOSPITAL_COMMUNITY): Payer: Self-pay

## 2023-12-21 MED ORDER — WEGOVY 0.25 MG/0.5ML ~~LOC~~ SOAJ: 0.2500 mg | SUBCUTANEOUS | 0 refills | Status: AC

## 2023-12-21 NOTE — Telephone Encounter (Signed)
 Copied from CRM [id number]. Topic: Clinical - Medication Refill >> Dec 21, 2023  9:28 AM Rosaria Common wrote: Medication: valACYclovir  (VALTREX ) 500 MG table, and Vitamin D , Ergocalciferol , (DRISDOL ) 1.25 MG (50000 UNIT) CAPS capsule  Has the patient contacted their pharmacy? Yes (Agent: If no, request that the patient contact the pharmacy for the refill. If patient does not wish to contact the pharmacy document the reason why and proceed with request.) (Agent: If yes, when and what did the pharmacy advise?)  This is the patient's preferred pharmacy:  [facility] [address] [place] [address] Phone: [phone] Fax: [phone]  Is this the correct pharmacy for this prescription? Yes If no, delete pharmacy and type the correct one.   Has the prescription been filled recently? Yes  Is the patient out of the medication? Yes  Has the patient been seen for an appointment in the last year OR does the patient have an upcoming appointment? Yes  Can we respond through MyChart? Yes  Agent: Please be advised that Rx refills may take up to 3 business days. We ask that you follow-up with your pharmacy.

## 2023-12-24 ENCOUNTER — Encounter (HOSPITAL_COMMUNITY): Payer: Self-pay | Admitting: Psychiatry

## 2023-12-24 ENCOUNTER — Telehealth (HOSPITAL_BASED_OUTPATIENT_CLINIC_OR_DEPARTMENT_OTHER): Admitting: Psychiatry

## 2023-12-24 ENCOUNTER — Other Ambulatory Visit (HOSPITAL_COMMUNITY): Payer: Self-pay

## 2023-12-24 DIAGNOSIS — F319 Bipolar disorder, unspecified: Secondary | ICD-10-CM

## 2023-12-24 DIAGNOSIS — F4310 Post-traumatic stress disorder, unspecified: Secondary | ICD-10-CM | POA: Diagnosis not present

## 2023-12-24 MED ORDER — LAMOTRIGINE 100 MG PO TABS
300.0000 mg | ORAL_TABLET | Freq: Every day | ORAL | 2 refills | Status: DC
  Filled 2023-12-24 – 2024-01-13 (×2): qty 90, 30d supply, fill #0
  Filled 2024-02-14: qty 90, 30d supply, fill #1
  Filled 2024-03-21: qty 90, 30d supply, fill #2

## 2023-12-24 MED ORDER — VALACYCLOVIR HCL 500 MG PO TABS
500.0000 mg | ORAL_TABLET | Freq: Every day | ORAL | 1 refills | Status: AC
  Filled 2023-12-28: qty 14, 14d supply, fill #0
  Filled 2024-01-13: qty 14, 14d supply, fill #1
  Filled 2024-01-24 – 2024-01-25 (×2): qty 14, 14d supply, fill #2
  Filled 2024-02-14: qty 14, 14d supply, fill #3
  Filled 2024-03-04: qty 14, 14d supply, fill #4
  Filled 2024-03-21: qty 14, 14d supply, fill #5
  Filled 2024-04-10: qty 14, 14d supply, fill #6
  Filled 2024-04-27: qty 14, 14d supply, fill #7
  Filled 2024-05-09 – 2024-06-04 (×3): qty 14, 14d supply, fill #8
  Filled 2024-06-30: qty 14, 14d supply, fill #9
  Filled 2024-07-15: qty 14, 14d supply, fill #10
  Filled 2024-08-14: qty 14, 14d supply, fill #11

## 2023-12-24 NOTE — Telephone Encounter (Signed)
 Requested Prescriptions  Pending Prescriptions Disp Refills   valACYclovir  (VALTREX ) 500 MG tablet 90 tablet 1    Sig: Take 1 tablet (500 mg total) by mouth daily.     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 Vernell Goldsmith, MD   1 year ago Mixed hyperlipidemia   [facility] [place]. [facility] Vernell Goldsmith, MD   1 year ago Chronic pain syndrome   [facility] [place]. [facility] Vernell Goldsmith, MD               Vitamin D , Ergocalciferol , (DRISDOL ) 1.25 MG (50000 UNIT) CAPS capsule 4 capsule 0    Sig: Take 1 capsule (50,000 Units total) by mouth every 7 (seven) days.     Endocrinology:  Vitamins - Vitamin D  Supplementation 2 Failed - 12/24/2023  1:57 PM      Failed - Manual Review: Route requests for 50,000 IU strength to the provider      Failed - Vitamin D  in normal range and within 360 days    Vit D, 25-Hydroxy  Date Value Ref Range Status  02/18/2019 33.7 30.0 - 100.0 ng/mL Final    Comment:    Vitamin D  deficiency has been defined by the Institute of Medicine and an Endocrine Society practice guideline as a level of serum 25-OH vitamin D  less than 20 ng/mL (1,2). The Endocrine Society went on to further define vitamin D  insufficiency as a level between 21 and 29 ng/mL (2). 1. IOM (Institute of Medicine). 2010. Dietary reference     intakes for calcium and D. [place]: The    [employer]. 2. Holick MF, Binkley Chetek, Bischoff-Ferrari HA, et al.    Evaluation, treatment, and prevention of vitamin D     deficiency: an Endocrine Society clinical practice    guideline. JCEM. 2011 Jul; 96(7):1911-30.          Passed - Ca in normal range and within 360 days    Calcium  Date Value Ref Range Status  05/22/2023 9.1 8.9 - 10.3 mg/dL Final         Passed - Valid encounter within last 12 months    Recent Outpatient Visits           4 days ago Gastroesophageal reflux disease without esophagitis   [facility], Ray Caffey, MD   6 months ago Degenerative superior labral anterior-to-posterior (SLAP) tear of left shoulder   [facility], MD   11 months ago Adhesive capsulitis of left shoulder   [facility] [place]. [facility] Vernell Goldsmith, MD   1 year ago Mixed hyperlipidemia    [facility]  [facility] Of [place]. [facility] Vernell Goldsmith, MD   1 year ago Chronic pain syndrome   [facility] Of [place]. [facility] Vernell Goldsmith, MD
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
## 2023-12-24 NOTE — Telephone Encounter (Signed)
 Requested medications are due for refill today.  yes  Requested medications are on the active medications list.  yes  Last refill. 11/21/2023 #4 0 rf  Future visit scheduled.   no  Notes to clinic.  Provider to review at this dosage. Please review for refill.    Requested Prescriptions  Pending Prescriptions Disp Refills   Vitamin D , Ergocalciferol , (DRISDOL ) 1.25 MG (50000 UNIT) CAPS capsule 4 capsule 0    Sig: Take 1 capsule (50,000 Units total) by mouth every 7 (seven) days.     Endocrinology:  Vitamins - Vitamin D  Supplementation 2 Failed - 12/24/2023  1:57 PM      Failed - Manual Review: Route requests for 50,000 IU strength to the provider      Failed - Vitamin D  in normal range and within 360 days    Vit D, 25-Hydroxy  Date Value Ref Range Status  02/18/2019 33.7 30.0 - 100.0 ng/mL Final    Comment:    Vitamin D  deficiency has been defined by the Institute of Medicine and an Endocrine Society practice guideline as a level of serum 25-OH vitamin D  less than 20 ng/mL (1,2). The Endocrine Society went on to further define vitamin D  insufficiency as a level between 21 and 29 ng/mL (2). 1. IOM (Institute of Medicine). 2010. Dietary reference    intakes for calcium and D. [place]: The    [employer]. 2. Holick MF, Binkley [place], Bischoff-Ferrari HA, et al.    Evaluation, treatment, and prevention of vitamin D     deficiency: an Endocrine Society clinical practice    guideline. JCEM. 2011 Jul; 96(7):1911-30.          Passed - Ca in normal range and within 360 days    Calcium  Date Value Ref Range Status  05/22/2023 9.1 8.9 - 10.3 mg/dL Final         Passed - Valid encounter within last 12 months    Recent Outpatient Visits           4 days ago Gastroesophageal reflux disease without esophagitis   [place] [facility], Ray Caffey, MD   6 months ago Degenerative superior labral anterior-to-posterior (SLAP) tear of left shoulder    [place] [facility], MD   11 months ago Adhesive capsulitis of left shoulder   [facility] [place]. [facility] Vernell Goldsmith, MD   1 year ago Mixed hyperlipidemia   [facility] [place]. [facility] Vernell Goldsmith, MD   1 year ago Chronic pain syndrome   [facility] [facility] Of [place]. [facility] Vernell Goldsmith, MD              Signed Prescriptions Disp Refills   valACYclovir  (VALTREX ) 500 MG tablet 90 tablet 1    Sig: Take 1 tablet (500 mg total) by mouth daily.     Antimicrobials:  Antiviral Agents - Anti-Herpetic Passed - 12/24/2023  1:57 PM      Passed - Valid encounter within last 12 months    Recent Outpatient Visits           4 days ago Gastroesophageal reflux disease without esophagitis    [facility], Ray Caffey, MD   6 months ago Degenerative superior labral anterior-to-posterior (SLAP) tear of left shoulder  [place] [facility], MD   11 months ago Adhesive capsulitis of left shoulder   [facility] [place] [facility] [place]. [facility] Vernell Goldsmith, MD   1 year ago Mixed hyperlipidemia   [facility] Of [place]. [facility] Vernell Goldsmith, MD   1 year ago Chronic pain syndrome   [facility] [facility] Of [place]. [facility] Vernell Goldsmith, MD

## 2023-12-25 ENCOUNTER — Other Ambulatory Visit (HOSPITAL_COMMUNITY): Payer: Self-pay

## 2023-12-27 ENCOUNTER — Encounter: Payer: Self-pay | Admitting: Family Medicine

## 2023-12-27 ENCOUNTER — Other Ambulatory Visit: Payer: Self-pay

## 2023-12-28 ENCOUNTER — Other Ambulatory Visit: Payer: Self-pay | Admitting: Family Medicine

## 2023-12-28 ENCOUNTER — Other Ambulatory Visit (HOSPITAL_COMMUNITY): Payer: Self-pay

## 2023-12-28 ENCOUNTER — Telehealth: Payer: Self-pay

## 2023-12-28 ENCOUNTER — Other Ambulatory Visit: Payer: Self-pay

## 2023-12-28 DIAGNOSIS — E66811 Obesity, class 1: Secondary | ICD-10-CM

## 2023-12-28 DIAGNOSIS — E6609 Other obesity due to excess calories: Secondary | ICD-10-CM

## 2023-12-28 MED ORDER — PHENTERMINE HCL 37.5 MG PO TABS
37.5000 mg | ORAL_TABLET | Freq: Every day | ORAL | 0 refills | Status: AC
  Filled 2023-12-28: qty 30, 30d supply, fill #0

## 2023-12-28 NOTE — Telephone Encounter (Signed)
 Pharmacy Patient Advocate Encounter   Received notification from CoverMyMeds that prior authorization for [facility] is required/requested.   Insurance verification completed.   The patient is insured through [facility] MEDICAID .   Per test claim: PA required; PA submitted to above mentioned insurance via CoverMyMeds Key/confirmation #/EOC [id number] Status is pending

## 2023-12-31 ENCOUNTER — Other Ambulatory Visit: Payer: Self-pay

## 2024-01-10 ENCOUNTER — Other Ambulatory Visit (HOSPITAL_COMMUNITY): Payer: Self-pay

## 2024-01-10 MED ORDER — VITAMIN D (ERGOCALCIFEROL) 1.25 MG (50000 UNIT) PO CAPS
50000.0000 [IU] | ORAL_CAPSULE | ORAL | 0 refills | Status: DC
  Filled 2024-01-10: qty 4, 28d supply, fill #0

## 2024-01-14 ENCOUNTER — Other Ambulatory Visit (HOSPITAL_COMMUNITY): Payer: Self-pay

## 2024-01-14 ENCOUNTER — Other Ambulatory Visit: Payer: Self-pay

## 2024-01-15 ENCOUNTER — Other Ambulatory Visit (HOSPITAL_COMMUNITY): Payer: Self-pay

## 2024-01-15 ENCOUNTER — Other Ambulatory Visit: Payer: Self-pay

## 2024-01-15 MED ORDER — OXYCODONE-ACETAMINOPHEN 10-325 MG PO TABS
1.0000 | ORAL_TABLET | Freq: Three times a day (TID) | ORAL | 0 refills | Status: DC
  Filled 2024-01-15 (×2): qty 90, 30d supply, fill #0

## 2024-01-16 ENCOUNTER — Other Ambulatory Visit: Payer: Self-pay

## 2024-01-23 NOTE — Telephone Encounter (Signed)
 Hi Betsy, can you add Preet into the schedule on Thursday for what ever time?

## 2024-01-24 ENCOUNTER — Other Ambulatory Visit (HOSPITAL_COMMUNITY): Payer: Self-pay

## 2024-01-24 ENCOUNTER — Ambulatory Visit: Admitting: Surgical

## 2024-01-24 ENCOUNTER — Other Ambulatory Visit: Payer: Self-pay | Admitting: Family Medicine

## 2024-01-24 DIAGNOSIS — K219 Gastro-esophageal reflux disease without esophagitis: Secondary | ICD-10-CM

## 2024-01-24 MED ORDER — OMEPRAZOLE 20 MG PO CPDR
20.0000 mg | DELAYED_RELEASE_CAPSULE | Freq: Every day | ORAL | 1 refills | Status: AC
  Filled 2024-01-24: qty 90, 90d supply, fill #0
  Filled 2024-05-09 – 2024-06-04 (×3): qty 90, 90d supply, fill #1

## 2024-01-25 ENCOUNTER — Ambulatory Visit: Admitting: Surgical

## 2024-01-30 ENCOUNTER — Other Ambulatory Visit (HOSPITAL_COMMUNITY): Payer: Self-pay

## 2024-01-30 ENCOUNTER — Other Ambulatory Visit: Payer: Self-pay | Admitting: Family Medicine

## 2024-01-31 ENCOUNTER — Other Ambulatory Visit (HOSPITAL_COMMUNITY): Payer: Self-pay

## 2024-01-31 MED ORDER — PRAVASTATIN SODIUM 40 MG PO TABS
40.0000 mg | ORAL_TABLET | Freq: Every day | ORAL | 1 refills | Status: AC
  Filled 2024-01-31 – 2024-02-14 (×2): qty 90, 90d supply, fill #0
  Filled 2024-06-04: qty 90, 90d supply, fill #1

## 2024-02-04 ENCOUNTER — Ambulatory Visit: Admitting: Surgical

## 2024-02-11 ENCOUNTER — Other Ambulatory Visit (HOSPITAL_COMMUNITY): Payer: Self-pay

## 2024-02-11 ENCOUNTER — Other Ambulatory Visit: Payer: Self-pay

## 2024-02-12 ENCOUNTER — Telehealth: Payer: Self-pay | Admitting: *Deleted

## 2024-02-12 NOTE — Telephone Encounter (Signed)
 eror

## 2024-02-13 ENCOUNTER — Ambulatory Visit (HOSPITAL_BASED_OUTPATIENT_CLINIC_OR_DEPARTMENT_OTHER): Attending: Family Medicine

## 2024-02-14 ENCOUNTER — Other Ambulatory Visit: Payer: Self-pay

## 2024-02-14 ENCOUNTER — Encounter: Payer: Self-pay | Admitting: Surgical

## 2024-02-14 ENCOUNTER — Ambulatory Visit (INDEPENDENT_AMBULATORY_CARE_PROVIDER_SITE_OTHER): Admitting: Surgical

## 2024-02-14 ENCOUNTER — Other Ambulatory Visit (INDEPENDENT_AMBULATORY_CARE_PROVIDER_SITE_OTHER): Payer: Self-pay

## 2024-02-14 ENCOUNTER — Other Ambulatory Visit (HOSPITAL_COMMUNITY): Payer: Self-pay

## 2024-02-14 DIAGNOSIS — M19012 Primary osteoarthritis, left shoulder: Secondary | ICD-10-CM | POA: Diagnosis not present

## 2024-02-14 DIAGNOSIS — M25512 Pain in left shoulder: Secondary | ICD-10-CM

## 2024-02-14 MED ORDER — BUPIVACAINE HCL 0.25 % IJ SOLN
0.6600 mL | INTRAMUSCULAR | Status: AC | PRN
  Administered 2024-02-14: .66 mL via INTRA_ARTICULAR

## 2024-02-14 MED ORDER — TRIAMCINOLONE ACETONIDE 40 MG/ML IJ SUSP
40.0000 mg | INTRAMUSCULAR | Status: AC | PRN
  Administered 2024-02-14: 40 mg via INTRA_ARTICULAR

## 2024-02-14 MED ORDER — LIDOCAINE HCL 1 % IJ SOLN
3.0000 mL | INTRAMUSCULAR | Status: AC | PRN
  Administered 2024-02-14: 3 mL

## 2024-02-14 NOTE — Progress Notes (Signed)
 Office Visit Note   Patient: Katie Sherman           Date of Birth: July 09, 1966           MRN: [id number] Visit Date: 02/14/2024 Requested by: Tanda Bleacher, MD [address] [place],  [place] [address] PCP: Tanda Bleacher, MD  Subjective: Chief Complaint  Patient presents with   Left Shoulder - Pain    HPI: Katie Sherman is a 58 y.o. female who presents to the office reporting left shoulder pain.  Patient reports pain that has been ongoing for about 2 months.  She denies any injury leading to onset of pain but she has fallen several times recently.  Pain did not start after a fall however.  She does have history of prior left shoulder rotator cuff tear repair and bicep tenodesis.  She describes really no pain at rest but it is painful with reaching away from her or lifting.  She denies any weakness or radicular pain.  Localizes pain to the top of the shoulder and in the lateral aspect of the shoulder.  She is right-hand dominant.  It is similar to 2 pain she was experiencing prior to her surgery.  She is in pain management and takes Percocet 10 mg..                ROS: All systems reviewed are negative as they relate to the chief complaint within the history of present illness.  Patient denies fevers or chills.  Assessment & Plan: Visit Diagnoses:  1. Arthritis of left acromioclavicular joint   2. Acute pain of left shoulder     Plan: Impression is left shoulder pain in a patient who has had prior rotator cuff tear repair and bicep tenodesis.  Overall her rotator cuff strength is intact today and there is no evidence of bicep tendon rupture with no Popeye deformity and good supination/bicep flexion strength without much pain.  Most of her pain seems to localize to the [facility] joint.  We discussed options available to patient and she would like to try diagnostic/therapeutic left [facility] joint injection under ultrasound guidance.  She tolerated injection well without complication.   We will plan to see how this does for her and she will send me a MyChart message in 1 week if no improvement.  If no improvement, may need to consider subacromial injection versus MRI for further evaluation of postop ossification versus calcific tendinitis based on radiographs today.  Follow-Up Instructions: No follow-ups on file.   Orders:  Orders Placed This Encounter  Procedures   XR Shoulder Left   US  Guided Needle Placement - No Linked Charges   No orders of the defined types were placed in this encounter.     Procedures: Medium Joint Inj: L acromioclavicular on 02/14/2024 8:28 PM Indications: diagnostic evaluation and pain Details: 25 G 1.5 in needle, ultrasound-guided superior approach Medications: 3 mL lidocaine  1 %; 0.66 mL bupivacaine  0.25 %; 40 mg triamcinolone  acetonide 40 MG/ML Outcome: tolerated well, no immediate complications Procedure, treatment alternatives, risks and benefits explained, specific risks discussed. Consent was given by the patient. Immediately prior to procedure a time out was called to verify the correct patient, procedure, equipment, support staff and site/side marked as required. Patient was prepped and draped in the usual sterile fashion.       Clinical Data: No additional findings.  Objective: Vital Signs: LMP 04/29/2012   Physical Exam:  Constitutional: Patient appears well-developed HEENT:  Head:  Normocephalic Eyes:EOM are normal Neck: Normal range of motion Cardiovascular: Normal rate Pulmonary/chest: Effort normal Neurologic: Patient is alert Skin: Skin is warm Psychiatric: Patient has normal mood and affect  Ortho Exam: Ortho exam demonstrates right shoulder with 60 degrees X rotation, 90 degrees abduction, 160 degrees forward elevation passively and actively.  This compared with the left shoulder with 30 degrees X rotation, 85 degrees abduction, 110 degrees forward elevation passively.  She has active forward elevation to about 90  degrees.  Good rotator cuff strength of supra, infra, subscap rated 5/5.  She does have reproduction of pain with testing of rotator cuff strength however.  mild tenderness over the bicipital groove.  Moderate tenderness over the left AC joint.  Pain with crossarm adduction does reproduce AC joint pain.  No tenderness over the right AC joint.  No Popeye deformity noted.  No skin changes noted.  Incisions are well-healed.  2+ radial pulse of the left upper extremity.  Intact supination and bicep flexion strength.  Specialty Comments:  No specialty comments available.  Imaging: No results found.   PMFS History: Patient Active Problem List   Diagnosis Date Noted   Synovitis of left shoulder 06/03/2023   Degenerative superior labral anterior-to-posterior (SLAP) tear of left shoulder 06/03/2023   Tendonitis of upper biceps tendon of left shoulder 06/03/2023   Complete tear of left rotator cuff 06/03/2023   S/P rotator cuff repair 05/29/2023   Homelessness 02/07/2022   Chronic pain syndrome 02/07/2022   Costochondritis 09/05/2021   Tobacco dependence 08/07/2020   Lung nodules 02/16/2020   Vitamin D  deficiency 02/19/2019   Carpal tunnel syndrome, left upper limb 03/19/2018   Mixed hyperlipidemia 11/14/2017   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   Postmenopausal 01/22/2017   Bipolar 1 disorder, depressed (HCC) 12/29/2016   Family history of suicide 11/20/2016   Depression 11/20/2016   Family history of breast cancer 11/20/2016   Adhesive capsulitis of left shoulder 11/19/2016   Greater trochanteric bursitis of right hip 11/19/2016   Past Medical History:  Diagnosis Date   Anxiety 06/01/2021   Arthritis    hands, right knee, left shoulder   Bilateral carpal tunnel syndrome 03/19/2018   Bipolar disorder (HCC)    bipolar 1 disorder   Cervical cancer (HCC)    COPD (chronic obstructive pulmonary disease) (HCC)    emphysema mild per patient   Depression    Dyspnea    with  exertion   GERD (gastroesophageal reflux disease)    Hemoptysis 01/11/2021   History of exercise stress test    ETT-Echo 5/18: normal EF, no ischemia   History of hiatal hernia    no current problems as of 05/22/23 per patient   History of kidney stones    passed stones, no surgery   History of loop recorder 2018   Patient does not have Loop Recorder.  In 2018, the patient wore a heart monitor for 30 days for a low heart rate - study was normal per patient.   HSV-2 (herpes simplex virus 2) infection    tx with valtrex    Pharyngeal dysphagia 01/11/2021   Pneumonia    x 1   PTSD (post-traumatic stress disorder) 09/25/2022    Family History  Problem Relation Age of Onset   Heart attack Mother 19   Heart disease Father    Heart attack Father 32   Liver cancer Father    Bone cancer Other    Breast cancer Maternal Aunt  Diagnosed in 40's   Bone cancer Maternal Aunt     Past Surgical History:  Procedure Laterality Date   CARPAL TUNNEL RELEASE Left 05/29/2023   Procedure: LEFT CARPAL TUNNEL RELEASE;  Surgeon: Addie Cordella Hamilton, MD;  Location[facility] OR;  Service: Orthopedics;  Laterality: Left;   CERVICAL CONE BIOPSY     MULTIPLE TOOTH EXTRACTIONS     all teeth extracted - does not wear dentures   SHOULDER ARTHROSCOPY WITH SUBACROMIAL DECOMPRESSION, ROTATOR CUFF REPAIR AND BICEP TENDON REPAIR Left 05/29/2023   Procedure: LEFT SHOULDER ARTHROSCOPY, DEBRIDEMENT, MINI OPEN BICEPS TENODESIS AND ROTATOR CUFF TEAR REPAIR;  Surgeon: Addie Cordella Hamilton, MD;  Location: [facility] OR;  Service: Orthopedics;  Laterality: Left;   TONSILLECTOMY     TUBAL LIGATION     WISDOM TOOTH EXTRACTION     Social History   Occupational History   Occupation: unemployed  Tobacco Use   Smoking status: Every Day    Current packs/day: 1.00    Average packs/day: 1 pack/day for 44.6 years (44.6 ttl pk-yrs)    Types: Cigarettes    Start date: 42   Smokeless tobacco: Never   Tobacco comments:    Smokes 1  pack a day ARJ 01/20/21 - verified 05/22/23  Vaping Use   Vaping status: Never Used  Substance and Sexual Activity   Alcohol use: No   Drug use: Yes    Types: Marijuana    Comment: occasionally - last use was in 01/2023   Sexual activity: Not Currently    Birth control/protection: None

## 2024-02-15 ENCOUNTER — Other Ambulatory Visit (HOSPITAL_COMMUNITY): Payer: Self-pay

## 2024-02-15 DIAGNOSIS — R5383 Other fatigue: Secondary | ICD-10-CM | POA: Diagnosis not present

## 2024-02-15 DIAGNOSIS — Z1159 Encounter for screening for other viral diseases: Secondary | ICD-10-CM | POA: Diagnosis not present

## 2024-02-15 DIAGNOSIS — E559 Vitamin D deficiency, unspecified: Secondary | ICD-10-CM | POA: Diagnosis not present

## 2024-02-15 DIAGNOSIS — E7800 Pure hypercholesterolemia, unspecified: Secondary | ICD-10-CM | POA: Diagnosis not present

## 2024-02-15 DIAGNOSIS — D539 Nutritional anemia, unspecified: Secondary | ICD-10-CM | POA: Diagnosis not present

## 2024-02-15 DIAGNOSIS — Z131 Encounter for screening for diabetes mellitus: Secondary | ICD-10-CM | POA: Diagnosis not present

## 2024-02-15 DIAGNOSIS — M129 Arthropathy, unspecified: Secondary | ICD-10-CM | POA: Diagnosis not present

## 2024-02-15 DIAGNOSIS — Z79899 Other long term (current) drug therapy: Secondary | ICD-10-CM | POA: Diagnosis not present

## 2024-02-15 MED ORDER — OXYCODONE-ACETAMINOPHEN 10-325 MG PO TABS
1.0000 | ORAL_TABLET | Freq: Four times a day (QID) | ORAL | 0 refills | Status: DC | PRN
  Filled 2024-02-15 (×2): qty 120, 30d supply, fill #0

## 2024-02-15 MED ORDER — NALOXONE HCL 4 MG/0.1ML NA LIQD
1.0000 | NASAL | 12 refills | Status: AC | PRN
  Filled 2024-02-15: qty 2, 2d supply, fill #0

## 2024-02-19 ENCOUNTER — Other Ambulatory Visit: Payer: Self-pay

## 2024-02-20 ENCOUNTER — Other Ambulatory Visit: Payer: Self-pay

## 2024-02-21 DIAGNOSIS — Z79899 Other long term (current) drug therapy: Secondary | ICD-10-CM | POA: Diagnosis not present

## 2024-03-04 ENCOUNTER — Other Ambulatory Visit: Payer: Self-pay

## 2024-03-04 ENCOUNTER — Other Ambulatory Visit: Payer: Self-pay | Admitting: Family Medicine

## 2024-03-05 ENCOUNTER — Other Ambulatory Visit (HOSPITAL_COMMUNITY): Payer: Self-pay

## 2024-03-05 MED ORDER — VITAMIN D (ERGOCALCIFEROL) 1.25 MG (50000 UNIT) PO CAPS
50000.0000 [IU] | ORAL_CAPSULE | ORAL | 0 refills | Status: DC
  Filled 2024-03-05: qty 4, 28d supply, fill #0

## 2024-03-16 ENCOUNTER — Encounter: Payer: Self-pay | Admitting: Family Medicine

## 2024-03-17 ENCOUNTER — Ambulatory Visit: Payer: Self-pay | Admitting: *Deleted

## 2024-03-17 NOTE — Telephone Encounter (Signed)
 Copied from CRM [id number]. Topic: Clinical - Medical Advice >> Mar 17, 2024  2:12 PM [name] wrote:  Pt pain management Dr relocated an hour & a half away and can't travel that far to see her unfortunately. Pt would like to know if provider can send in prescription of my Oxycodone  10/325 this one time please? Pt get 120 a month. In the mean time she is looking for a new pain management Dr and would like to see asap. Pt also experiencing  pretty bad chafing and was wondering if you could send a prescription of Nystatin  powder. Message has been sent through mychart

## 2024-03-17 NOTE — Telephone Encounter (Signed)
 FYI Only or Action Required?: FYI only for provider.  Patient was last seen in primary care on 12/20/2023 by Tanda Bleacher, MD.  Called Nurse Triage reporting Rash.  Symptoms began several weeks ago.  Interventions attempted: Other: roommates nystatin  powder.  Symptoms are: unchanged.  Triage Disposition: See Physician Within 24 Hours  Patient/caregiver understands and will follow disposition?: Yes   Patient requesting PCP to assist with prescribing oxycodone  until she finds new pain dr.             [place] for Disposition  [1] Localized rash is very painful AND [2] no fever  Answer Assessment - Initial Assessment Questions Appt scheduled tomorrow. Patient also requesting Rx for oxycodone  10/325 mg #120/month. Patient in transition getting another pain dr due to last pain dr moved to another location and patient does not have transportation to get to new location. Patient is not out of medication. Recommended if sx noted go to ED.  Patient reports she has tried nystatin  powder from roommate and did help with rash before but has returned.         1. APPEARANCE of RASH: What does the rash look like? (e.g., blisters, dry flaky skin, red spots, redness, sores)     Rash ,redness like chafing  2. LOCATION: Where is the rash located?      Bilateral groin left worse than right  3. NUMBER: How many spots are there?      One to two large areas  4. SIZE: How big are the spots? (e.g., inches, cm; or compare to size of pinhead, tip of pen, eraser, pea)      Left side size of palm, right side smaller 5. ONSET: When did the rash start?      1-2 weeks  6. ITCHING: Does the rash itch? If Yes, ask: How bad is the itch?  (Scale 0-10; or none, mild, moderate, severe)     Yes itching  7. PAIN: Does the rash hurt? If Yes, ask: How bad is the pain?  (Scale 0-10; or none, mild, moderate, severe)     Burning sensation 8. OTHER SYMPTOMS: Do you have any other symptoms?  (e.g., fever)     Pain,  9. PREGNANCY: Is there any chance you are pregnant? When was your last menstrual period?     na  Protocols used: Rash or Redness - Localized-A-AH

## 2024-03-18 ENCOUNTER — Encounter: Payer: Self-pay | Admitting: Family Medicine

## 2024-03-18 ENCOUNTER — Other Ambulatory Visit (HOSPITAL_COMMUNITY): Payer: Self-pay

## 2024-03-18 ENCOUNTER — Ambulatory Visit: Admitting: Family Medicine

## 2024-03-18 ENCOUNTER — Other Ambulatory Visit: Payer: Self-pay | Admitting: Family Medicine

## 2024-03-18 VITALS — BP 131/81 | HR 84 | Ht 67.0 in | Wt 189.6 lb

## 2024-03-18 DIAGNOSIS — G894 Chronic pain syndrome: Secondary | ICD-10-CM | POA: Diagnosis not present

## 2024-03-18 DIAGNOSIS — L304 Erythema intertrigo: Secondary | ICD-10-CM | POA: Diagnosis not present

## 2024-03-18 DIAGNOSIS — F17200 Nicotine dependence, unspecified, uncomplicated: Secondary | ICD-10-CM

## 2024-03-18 MED ORDER — NYSTATIN 100000 UNIT/GM EX POWD
1.0000 | Freq: Three times a day (TID) | CUTANEOUS | 0 refills | Status: AC
  Filled 2024-03-18: qty 15, 5d supply, fill #0

## 2024-03-18 MED ORDER — NYSTATIN 100000 UNIT/GM EX CREA
1.0000 | TOPICAL_CREAM | Freq: Two times a day (BID) | CUTANEOUS | 1 refills | Status: AC
  Filled 2024-03-18: qty 30, 15d supply, fill #0

## 2024-03-18 MED ORDER — TRIAMCINOLONE ACETONIDE 40 MG/ML IJ SUSP
40.0000 mg | Freq: Once | INTRAMUSCULAR | Status: AC
  Administered 2024-03-18: 40 mg via INTRAMUSCULAR

## 2024-03-18 NOTE — Telephone Encounter (Unsigned)
 Copied from CRM [id number]. Topic: Clinical - Medication Question >> Mar 17, 2024  3:52 PM Precious C wrote: Reason for CRM: pt is requesting a callback regarding oxyCODONE -acetaminophen  (PERCOCET) 10-325 MG tablet and Niacin medication, which is currently not listed on current medication list...callback is [phone]

## 2024-03-19 ENCOUNTER — Encounter: Payer: Self-pay | Admitting: Family Medicine

## 2024-03-19 NOTE — Progress Notes (Signed)
 Established Patient Office Visit  Subjective    Patient ID: Katie Sherman, female    DOB: 04-Nov-1965  Age: 58 y.o. MRN: [id number]  CC:  Chief Complaint  Patient presents with   Medical Management of Chronic Issues    Pt reports chaffing     HPI Katie Sherman presents for complaint of chronic pain. She wants referral to local pain management. Patient also reports rash in folds of skin.   Outpatient Encounter Medications as of 03/18/2024  Medication Sig   albuterol  (VENTOLIN  HFA) 108 (90 Base) MCG/ACT inhaler Inhale 2 puffs into the lungs every 6 (six) hours as needed for wheezing or shortness of breath.   cyclobenzaprine  (FLEXERIL ) 10 MG tablet Take 1 tablet (10 mg total) by mouth 3 (three) times daily as needed for muscle spasms.   ibuprofen  (ADVIL ) 600 MG tablet Take 1 tablet (600 mg total) by mouth every 8 (eight) hours as needed. (Patient taking differently: Take 600 mg by mouth every 8 (eight) hours as needed for mild pain (pain score 1-3).)   lamoTRIgine  (LAMICTAL ) 100 MG tablet Take 3 tablets (300 mg total) by mouth daily.   naloxone  (NARCAN ) nasal spray 4 mg/0.1 mL Use in case of accidental overdose   naloxone  (NARCAN ) nasal spray 4 mg/0.1 mL Spray as directed in one nostril as needed for opioid overdose.   naloxone  (NARCAN ) nasal spray 4 mg/0.1 mL Use 1 spray as directed in one nostril as needed for opioid overdose.   nitroGLYCERIN  (NITRODUR - DOSED IN MG/24 HR) 0.2 mg/hr patch Apply 1/4 patch daily to the affected area (Patient taking differently: Place 0.2 mg onto the skin daily as needed (For pain).)   nystatin  (MYCOSTATIN /NYSTOP ) powder Apply 1 Application topically 3 (three) times daily.   nystatin  cream (MYCOSTATIN ) Apply 1 Application topically 2 (two) times daily.   omeprazole  (PRILOSEC) 20 MG capsule Take 1 capsule (20 mg total) by mouth daily.   oxyCODONE -acetaminophen  (PERCOCET) 10-325 MG tablet Take 1 tablet by mouth every 6 (six) hours as needed for moderate  pain   oxyCODONE -acetaminophen  (PERCOCET) 10-325 MG tablet Take 1 tablet by mouth every 6 (six) hours as needed for moderate pain.   oxyCODONE -acetaminophen  (PERCOCET) 10-325 MG tablet Take 1 tablet by mouth every 6 (six) hours as needed.   phentermine  (ADIPEX-P ) 37.5 MG tablet Take 1 tablet (37.5 mg total) by mouth daily before breakfast.   pravastatin  (PRAVACHOL ) 40 MG tablet Take 1 tablet (40 mg total) by mouth daily.   QUEtiapine  (SEROQUEL ) 100 MG tablet Take 1 tablet (100 mg total) by mouth at bedtime.   Semaglutide -Weight Management (WEGOVY ) 0.25 MG/0.5ML SOAJ Inject 0.25 mg into the skin once a week.   valACYclovir  (VALTREX ) 500 MG tablet Take 1 tablet (500 mg total) by mouth daily.   Vitamin D , Ergocalciferol , (DRISDOL ) 1.25 MG (50000 UNIT) CAPS capsule Take 1 capsule (50,000 Units total) by mouth every 7 (seven) days.   [EXPIRED] triamcinolone  acetonide (KENALOG -40) injection 40 mg    No facility-administered encounter medications on file as of 03/18/2024.    Past Medical History:  Diagnosis Date   Anxiety 06/01/2021   Arthritis    hands, right knee, left shoulder   Bilateral carpal tunnel syndrome 03/19/2018   Bipolar disorder (HCC)    bipolar 1 disorder   Cervical cancer (HCC)    COPD (chronic obstructive pulmonary disease) (HCC)    emphysema mild per patient   Depression    Dyspnea    with exertion   GERD (gastroesophageal  reflux disease)    Hemoptysis 01/11/2021   History of exercise stress test    ETT-Echo 5/18: normal EF, no ischemia   History of hiatal hernia    no current problems as of 05/22/23 per patient   History of kidney stones    passed stones, no surgery   History of loop recorder 2018   Patient does not have Loop Recorder.  In 2018, the patient wore a heart monitor for 30 days for a low heart rate - study was normal per patient.   HSV-2 (herpes simplex virus 2) infection    tx with valtrex    Pharyngeal dysphagia 01/11/2021   Pneumonia    x 1   PTSD  (post-traumatic stress disorder) 09/25/2022    Past Surgical History:  Procedure Laterality Date   CARPAL TUNNEL RELEASE Left 05/29/2023   Procedure: LEFT CARPAL TUNNEL RELEASE;  Surgeon: Addie Cordella Hamilton, MD;  Location: [facility] OR;  Service: Orthopedics;  Laterality: Left;   CERVICAL CONE BIOPSY     MULTIPLE TOOTH EXTRACTIONS     all teeth extracted - does not wear dentures   SHOULDER ARTHROSCOPY WITH SUBACROMIAL DECOMPRESSION, ROTATOR CUFF REPAIR AND BICEP TENDON REPAIR Left 05/29/2023   Procedure: LEFT SHOULDER ARTHROSCOPY, DEBRIDEMENT, MINI OPEN BICEPS TENODESIS AND ROTATOR CUFF TEAR REPAIR;  Surgeon: Addie Cordella Hamilton, MD;  Location: [facility] OR;  Service: Orthopedics;  Laterality: Left;   TONSILLECTOMY     TUBAL LIGATION     WISDOM TOOTH EXTRACTION      Family History  Problem Relation Age of Onset   Heart attack Mother 53   Heart disease Father    Heart attack Father 8   Liver cancer Father    Bone cancer Other    Breast cancer Maternal Aunt        Diagnosed in 10's   Bone cancer Maternal Aunt     Social History   Socioeconomic History   Marital status: Widowed    Spouse name: Not on file   Number of children: Not on file   Years of education: Not on file   Highest education level: Not on file  Occupational History   Occupation: unemployed  Tobacco Use   Smoking status: Every Day    Current packs/day: 1.00    Average packs/day: 1 pack/day for 44.7 years (44.7 ttl pk-yrs)    Types: Cigarettes    Start date: 67   Smokeless tobacco: Never   Tobacco comments:    Smokes 1 pack a day ARJ 01/20/21 - verified 05/22/23  Vaping Use   Vaping status: Never Used  Substance and Sexual Activity   Alcohol use: No   Drug use: Yes    Types: Marijuana    Comment: occasionally - last use was in 01/2023   Sexual activity: Not Currently    Birth control/protection: None  Other Topics Concern   Not on file  Social History Narrative   Prior traffic control specialist -  unemployed now   Widow   5 children, 10 grandchildren   Moved here from [place] 2013   Social Drivers of Health   Financial Resource Strain: Low Risk  (06/11/2023)   Overall Financial Resource Strain (CARDIA)    Difficulty of Paying Living Expenses: Not hard at all  Food Insecurity: No Food Insecurity (05/29/2023)   Hunger Vital Sign    Worried About Running Out of Food in the Last Year: Never true    Ran Out of Food in the Last Year: Never true  Transportation Needs: No Transportation Needs (05/29/2023)   PRAPARE - Administrator, Civil Service (Medical): No    Lack of Transportation (Non-Medical): No  Physical Activity: Inactive (06/11/2023)   Exercise Vital Sign    Days of Exercise per Week: 0 days    Minutes of Exercise per Session: 0 min  Stress: Stress Concern Present (06/11/2023)   [employer] of Occupational Health - Occupational Stress Questionnaire    Feeling of Stress : Rather much  Social Connections: Socially Isolated (06/11/2023)   Social Connection and Isolation Panel    Frequency of Communication with Friends and Family: More than three times a week    Frequency of Social Gatherings with Friends and Family: More than three times a week    Attends Religious Services: Never    Database administrator or Organizations: No    Attends Banker Meetings: Never    Marital Status: Widowed  Intimate Partner Violence: Not At Risk (05/29/2023)   Humiliation, Afraid, Rape, and Kick questionnaire    Fear of Current or Ex-Partner: No    Emotionally Abused: No    Physically Abused: No    Sexually Abused: No    Review of Systems  All other systems reviewed and are negative.       Objective    BP 131/81   Pulse 84   Ht 5' 7 (1.702 m)   Wt 189 lb 9.6 oz (86 kg)   LMP 04/29/2012   SpO2 95%   BMI 29.70 kg/m   Physical Exam Vitals and nursing note reviewed.  Constitutional:      General: She is not in acute distress.    Appearance:  She is obese.  Cardiovascular:     Rate and Rhythm: Normal rate and regular rhythm.  Pulmonary:     Effort: Pulmonary effort is normal.     Breath sounds: Normal breath sounds.  Abdominal:     Palpations: Abdomen is soft.     Tenderness: There is no abdominal tenderness.  Neurological:     General: No focal deficit present.     Mental Status: She is alert and oriented to person, place, and time.  Psychiatric:        Mood and Affect: Mood normal.        Behavior: Behavior normal.         Assessment & Plan:   1. Intertrigo (Primary) Nystatin  cream and powder prescribed. Skin care discussed.   2. Chronic pain syndrome Keep follow up with consultant - triamcinolone  acetonide (KENALOG -40) injection 40 mg  3. Smoker Discussed reduction/cessation No follow-ups on file.   Tanda Raguel SQUIBB, MD

## 2024-03-20 ENCOUNTER — Telehealth: Payer: Self-pay

## 2024-03-20 ENCOUNTER — Telehealth: Payer: Self-pay | Admitting: Family Medicine

## 2024-03-20 NOTE — Telephone Encounter (Signed)
 Prior shara has been approved for CT  Approval # [id number]  DOS 03/20/24 - 05/04/24

## 2024-03-20 NOTE — Telephone Encounter (Signed)
 Copied from CRM [id number]. Topic: Clinical - Request for Lab/Test Order >> Mar 20, 2024  9:18 AM Pinkey ORN wrote: Reason for CRM: Prior Authorization >> Mar 20, 2024  9:21 AM Pinkey ORN wrote: Patient requires a prior authorization in order to have the lung cancer imaging completed. Please have this faxed over to [facility] [phone]

## 2024-03-20 NOTE — Telephone Encounter (Signed)
 Copied from CRM #[id number]. Topic: Referral - Question >> Mar 20, 2024  2:11 PM Mia F wrote: Reason for CRM: Pt was referred to pain management. She called back to let pcp know where she would want to go. Pt would like to see   [facility]  Address: [address], [place], [place] [address] Phone: [phone] Fax: [phone]

## 2024-03-21 ENCOUNTER — Other Ambulatory Visit (HOSPITAL_COMMUNITY): Payer: Self-pay

## 2024-03-23 ENCOUNTER — Ambulatory Visit (HOSPITAL_BASED_OUTPATIENT_CLINIC_OR_DEPARTMENT_OTHER)

## 2024-03-24 ENCOUNTER — Other Ambulatory Visit (HOSPITAL_COMMUNITY): Payer: Self-pay

## 2024-03-27 ENCOUNTER — Other Ambulatory Visit (HOSPITAL_COMMUNITY): Payer: Self-pay

## 2024-03-27 ENCOUNTER — Encounter (HOSPITAL_COMMUNITY): Payer: Self-pay | Admitting: Psychiatry

## 2024-03-27 ENCOUNTER — Telehealth (HOSPITAL_COMMUNITY): Admitting: Psychiatry

## 2024-03-27 VITALS — Wt 189.0 lb

## 2024-03-27 DIAGNOSIS — F4310 Post-traumatic stress disorder, unspecified: Secondary | ICD-10-CM | POA: Diagnosis not present

## 2024-03-27 DIAGNOSIS — F319 Bipolar disorder, unspecified: Secondary | ICD-10-CM

## 2024-03-27 MED ORDER — LAMOTRIGINE 100 MG PO TABS
300.0000 mg | ORAL_TABLET | Freq: Every day | ORAL | 2 refills | Status: AC
  Filled 2024-03-27 – 2024-04-27 (×2): qty 90, 30d supply, fill #0
  Filled 2024-06-04: qty 90, 30d supply, fill #1
  Filled 2024-07-15: qty 90, 30d supply, fill #2
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
## 2024-04-06 DIAGNOSIS — Z79899 Other long term (current) drug therapy: Secondary | ICD-10-CM | POA: Diagnosis not present

## 2024-04-07 ENCOUNTER — Other Ambulatory Visit (HOSPITAL_COMMUNITY): Payer: Self-pay

## 2024-04-07 ENCOUNTER — Other Ambulatory Visit: Payer: Self-pay

## 2024-04-07 MED ORDER — NALOXONE HCL 4 MG/0.1ML NA LIQD
1.0000 | NASAL | 12 refills | Status: AC | PRN
  Filled 2024-04-07 (×2): qty 2, 1d supply, fill #0

## 2024-04-07 MED ORDER — MELOXICAM 7.5 MG PO TABS
7.5000 mg | ORAL_TABLET | Freq: Every day | ORAL | 0 refills | Status: AC
  Filled 2024-04-07: qty 30, 30d supply, fill #0

## 2024-04-07 MED ORDER — OXYCODONE-ACETAMINOPHEN 10-325 MG PO TABS
1.0000 | ORAL_TABLET | Freq: Four times a day (QID) | ORAL | 0 refills | Status: DC | PRN
  Filled 2024-04-07 (×2): qty 56, 14d supply, fill #0

## 2024-04-09 DIAGNOSIS — Z79899 Other long term (current) drug therapy: Secondary | ICD-10-CM | POA: Diagnosis not present

## 2024-04-10 ENCOUNTER — Other Ambulatory Visit: Payer: Self-pay

## 2024-04-10 ENCOUNTER — Other Ambulatory Visit (HOSPITAL_COMMUNITY): Payer: Self-pay

## 2024-04-10 ENCOUNTER — Other Ambulatory Visit: Payer: Self-pay | Admitting: Family Medicine

## 2024-04-10 MED ORDER — VITAMIN D (ERGOCALCIFEROL) 1.25 MG (50000 UNIT) PO CAPS
50000.0000 [IU] | ORAL_CAPSULE | ORAL | 0 refills | Status: AC
  Filled 2024-04-10: qty 4, 28d supply, fill #0

## 2024-04-11 ENCOUNTER — Other Ambulatory Visit (HOSPITAL_COMMUNITY): Payer: Self-pay

## 2024-04-25 ENCOUNTER — Other Ambulatory Visit (HOSPITAL_COMMUNITY): Payer: Self-pay

## 2024-04-25 DIAGNOSIS — Z79899 Other long term (current) drug therapy: Secondary | ICD-10-CM | POA: Diagnosis not present

## 2024-04-25 MED ORDER — NALOXONE HCL 4 MG/0.1ML NA LIQD
4.0000 mg | Freq: Once | NASAL | 12 refills | Status: AC | PRN
  Filled 2024-04-25: qty 2, 1d supply, fill #0

## 2024-04-25 MED ORDER — OXYCODONE-ACETAMINOPHEN 10-325 MG PO TABS
1.0000 | ORAL_TABLET | Freq: Four times a day (QID) | ORAL | 0 refills | Status: DC | PRN
  Filled 2024-04-25: qty 120, 30d supply, fill #0

## 2024-04-25 MED ORDER — CEPHALEXIN 500 MG PO CAPS
500.0000 mg | ORAL_CAPSULE | Freq: Four times a day (QID) | ORAL | 0 refills | Status: AC
  Filled 2024-04-25: qty 40, 10d supply, fill #0

## 2024-04-28 ENCOUNTER — Other Ambulatory Visit (HOSPITAL_COMMUNITY): Payer: Self-pay

## 2024-04-28 ENCOUNTER — Other Ambulatory Visit: Payer: Self-pay

## 2024-04-30 DIAGNOSIS — M25551 Pain in right hip: Secondary | ICD-10-CM | POA: Diagnosis not present

## 2024-04-30 DIAGNOSIS — M25552 Pain in left hip: Secondary | ICD-10-CM | POA: Diagnosis not present

## 2024-04-30 DIAGNOSIS — M79605 Pain in left leg: Secondary | ICD-10-CM | POA: Diagnosis not present

## 2024-05-09 ENCOUNTER — Other Ambulatory Visit: Payer: Self-pay | Admitting: Family Medicine

## 2024-05-12 ENCOUNTER — Other Ambulatory Visit (HOSPITAL_COMMUNITY): Payer: Self-pay

## 2024-05-12 ENCOUNTER — Other Ambulatory Visit: Payer: Self-pay

## 2024-05-12 ENCOUNTER — Encounter: Payer: Self-pay | Admitting: Radiology

## 2024-05-12 ENCOUNTER — Encounter (HOSPITAL_COMMUNITY): Payer: Self-pay

## 2024-05-12 NOTE — Telephone Encounter (Signed)
 Requested medication (s) are due for refill today: yes  Requested medication (s) are on the active medication list: yes  Last refill:  04/10/24  Future visit scheduled: yes  Notes to clinic:   Manual Review: Route requests for 50,000 IU strength to the provider      Requested Prescriptions  Pending Prescriptions Disp Refills   Vitamin D , Ergocalciferol , (DRISDOL ) 1.25 MG (50000 UNIT) CAPS capsule 4 capsule 0    Sig: Take 1 capsule (50,000 Units total) by mouth every 7 (seven) days.     Endocrinology:  Vitamins - Vitamin D  Supplementation 2 Failed - 05/12/2024 12:20 PM      Failed - Manual Review: Route requests for 50,000 IU strength to the provider      Failed - Vitamin D  in normal range and within 360 days    Vit D, 25-Hydroxy  Date Value Ref Range Status  02/18/2019 33.7 30.0 - 100.0 ng/mL Final    Comment:    Vitamin D  deficiency has been defined by the Institute of Medicine and an Endocrine Society practice guideline as a level of serum 25-OH vitamin D  less than 20 ng/mL (1,2). The Endocrine Society went on to further define vitamin D  insufficiency as a level between 21 and 29 ng/mL (2). 1. IOM (Institute of Medicine). 2010. Dietary reference    intakes for calcium and D. [place]: The    [employer]. 2. Holick MF, Binkley [place], Bischoff-Ferrari HA, et al.    Evaluation, treatment, and prevention of vitamin D     deficiency: an Endocrine Society clinical practice    guideline. JCEM. 2011 Jul; 96(7):1911-30.          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 Brien Belvie BRAVO, MD   1 year ago Mixed hyperlipidemia   [facility] [place]. [facility] Brien Belvie BRAVO, MD

## 2024-05-12 NOTE — Telephone Encounter (Signed)
 Requested medication (s) are due for refill today: yes  Requested medication (s) are on the active medication list: yes  Last refill:  04/10/24  Future visit scheduled: yes  Notes to clinic:   Manual Review: Route requests for 50,000 IU strength to the provider      Requested Prescriptions  Pending Prescriptions Disp Refills   Vitamin D , Ergocalciferol , (DRISDOL ) 1.25 MG (50000 UNIT) CAPS capsule 4 capsule 0    Sig: Take 1 capsule (50,000 Units total) by mouth every 7 (seven) days.     Endocrinology:  Vitamins - Vitamin D  Supplementation 2 Failed - 05/12/2024 12:23 PM      Failed - Manual Review: Route requests for 50,000 IU strength to the provider      Failed - Vitamin D  in normal range and within 360 days    Vit D, 25-Hydroxy  Date Value Ref Range Status  02/18/2019 33.7 30.0 - 100.0 ng/mL Final    Comment:    Vitamin D  deficiency has been defined by the Institute of Medicine and an Endocrine Society practice guideline as a level of serum 25-OH vitamin D  less than 20 ng/mL (1,2). The Endocrine Society went on to further define vitamin D  insufficiency as a level between 21 and 29 ng/mL (2). 1. IOM (Institute of Medicine). 2010. Dietary reference    intakes for calcium and D. [place]: The    [employer]. 2. Holick MF, Binkley [place], Bischoff-Ferrari HA, et al.    Evaluation, treatment, and prevention of vitamin D     deficiency: an Endocrine Society clinical practice    guideline. JCEM. 2011 Jul; 96(7):1911-30.          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 Brien Belvie BRAVO, MD   1 year ago Mixed hyperlipidemia   [facility] [place]. [facility] Brien Belvie BRAVO, MD

## 2024-05-22 ENCOUNTER — Other Ambulatory Visit (HOSPITAL_COMMUNITY): Payer: Self-pay

## 2024-05-23 ENCOUNTER — Other Ambulatory Visit: Payer: Self-pay

## 2024-06-02 ENCOUNTER — Other Ambulatory Visit (HOSPITAL_COMMUNITY): Payer: Self-pay

## 2024-06-02 MED ORDER — OXYCODONE-ACETAMINOPHEN 10-325 MG PO TABS
1.0000 | ORAL_TABLET | Freq: Four times a day (QID) | ORAL | 0 refills | Status: DC | PRN
  Filled 2024-06-02: qty 120, 30d supply, fill #0

## 2024-06-02 MED ORDER — NALOXONE HCL 4 MG/0.1ML NA LIQD
4.0000 mg | NASAL | 12 refills | Status: AC | PRN
  Filled 2024-06-02: qty 2, 1d supply, fill #0

## 2024-06-02 MED ORDER — ACCRUFER 30 MG PO CAPS
30.0000 mg | ORAL_CAPSULE | Freq: Two times a day (BID) | ORAL | 0 refills | Status: AC
  Filled 2024-06-02: qty 60, 30d supply, fill #0

## 2024-06-03 ENCOUNTER — Other Ambulatory Visit (HOSPITAL_COMMUNITY): Payer: Self-pay

## 2024-06-04 ENCOUNTER — Other Ambulatory Visit (HOSPITAL_COMMUNITY): Payer: Self-pay

## 2024-06-04 DIAGNOSIS — Z79899 Other long term (current) drug therapy: Secondary | ICD-10-CM | POA: Diagnosis not present

## 2024-06-17 ENCOUNTER — Ambulatory Visit: Admitting: Family Medicine

## 2024-06-26 ENCOUNTER — Ambulatory Visit (HOSPITAL_COMMUNITY): Admitting: Psychiatry

## 2024-06-30 ENCOUNTER — Other Ambulatory Visit: Payer: Self-pay

## 2024-06-30 ENCOUNTER — Other Ambulatory Visit (HOSPITAL_COMMUNITY): Payer: Self-pay

## 2024-06-30 MED ORDER — ACCRUFER 30 MG PO CAPS
30.0000 mg | ORAL_CAPSULE | Freq: Two times a day (BID) | ORAL | 0 refills | Status: AC
  Filled 2024-06-30: qty 60, 30d supply, fill #0

## 2024-06-30 MED ORDER — NALOXONE HCL 4 MG/0.1ML NA LIQD
1.0000 | NASAL | 12 refills | Status: AC | PRN
  Filled 2024-06-30: qty 2, 1d supply, fill #0

## 2024-06-30 MED ORDER — OXYCODONE-ACETAMINOPHEN 10-325 MG PO TABS
1.0000 | ORAL_TABLET | Freq: Four times a day (QID) | ORAL | 0 refills | Status: AC | PRN
  Filled 2024-06-30: qty 120, 30d supply, fill #0

## 2024-07-11 ENCOUNTER — Other Ambulatory Visit (HOSPITAL_COMMUNITY): Payer: Self-pay

## 2024-07-22 ENCOUNTER — Other Ambulatory Visit (HOSPITAL_COMMUNITY): Payer: Self-pay

## 2024-08-14 ENCOUNTER — Other Ambulatory Visit: Payer: Self-pay

## 2024-08-14 ENCOUNTER — Other Ambulatory Visit (HOSPITAL_COMMUNITY): Payer: Self-pay | Admitting: Psychiatry

## 2024-08-14 DIAGNOSIS — F319 Bipolar disorder, unspecified: Secondary | ICD-10-CM

## 2024-08-14 DIAGNOSIS — F4310 Post-traumatic stress disorder, unspecified: Secondary | ICD-10-CM

## 2024-08-21 ENCOUNTER — Ambulatory Visit (HOSPITAL_COMMUNITY): Admitting: Psychiatry
